# Patient Record
Sex: Female | Born: 1950 | Race: Black or African American | Hispanic: No | Smoking: Current every day smoker
Health system: Southern US, Community
[De-identification: ages and names within clinical notes are randomized; demographics above are authoritative.]

## PROBLEM LIST (undated history)

## (undated) DIAGNOSIS — F32A Depression, unspecified: Secondary | ICD-10-CM

## (undated) DIAGNOSIS — M1990 Unspecified osteoarthritis, unspecified site: Secondary | ICD-10-CM

## (undated) DIAGNOSIS — E785 Hyperlipidemia, unspecified: Secondary | ICD-10-CM

## (undated) DIAGNOSIS — I10 Essential (primary) hypertension: Secondary | ICD-10-CM

## (undated) DIAGNOSIS — F419 Anxiety disorder, unspecified: Secondary | ICD-10-CM

## (undated) DIAGNOSIS — N289 Disorder of kidney and ureter, unspecified: Secondary | ICD-10-CM

## (undated) DIAGNOSIS — C50919 Malignant neoplasm of unspecified site of unspecified female breast: Secondary | ICD-10-CM

## (undated) DIAGNOSIS — I219 Acute myocardial infarction, unspecified: Secondary | ICD-10-CM

## (undated) DIAGNOSIS — Z9889 Other specified postprocedural states: Secondary | ICD-10-CM

## (undated) DIAGNOSIS — I509 Heart failure, unspecified: Secondary | ICD-10-CM

## (undated) DIAGNOSIS — R112 Nausea with vomiting, unspecified: Secondary | ICD-10-CM

## (undated) DIAGNOSIS — E119 Type 2 diabetes mellitus without complications: Secondary | ICD-10-CM

## (undated) DIAGNOSIS — G4733 Obstructive sleep apnea (adult) (pediatric): Secondary | ICD-10-CM

## (undated) DIAGNOSIS — J449 Chronic obstructive pulmonary disease, unspecified: Secondary | ICD-10-CM

## (undated) DIAGNOSIS — T7840XA Allergy, unspecified, initial encounter: Secondary | ICD-10-CM

## (undated) HISTORY — DX: Type 2 diabetes mellitus without complications: E11.9

## (undated) HISTORY — DX: Allergy, unspecified, initial encounter: T78.40XA

## (undated) HISTORY — DX: Malignant neoplasm of unspecified site of unspecified female breast: C50.919

## (undated) HISTORY — PX: TUBAL LIGATION: SHX77

## (undated) HISTORY — DX: Obstructive sleep apnea (adult) (pediatric): G47.33

## (undated) HISTORY — PX: BREAST SURGERY: SHX581

## (undated) HISTORY — PX: BACK SURGERY: SHX140

## (undated) HISTORY — DX: Essential (primary) hypertension: I10

## (undated) HISTORY — DX: Hyperlipidemia, unspecified: E78.5

## (undated) HISTORY — PX: REPLACEMENT TOTAL KNEE: SUR1224

---

## 2001-07-19 ENCOUNTER — Inpatient Hospital Stay (HOSPITAL_COMMUNITY): Admission: RE | Admit: 2001-07-19 | Discharge: 2001-07-22 | Payer: Self-pay | Admitting: Orthopaedic Surgery

## 2001-07-19 ENCOUNTER — Encounter: Payer: Self-pay | Admitting: Orthopaedic Surgery

## 2016-05-24 DIAGNOSIS — I2510 Atherosclerotic heart disease of native coronary artery without angina pectoris: Secondary | ICD-10-CM

## 2016-05-24 DIAGNOSIS — Z9889 Other specified postprocedural states: Secondary | ICD-10-CM

## 2016-05-24 HISTORY — DX: Other specified postprocedural states: Z98.890

## 2016-05-24 HISTORY — DX: Atherosclerotic heart disease of native coronary artery without angina pectoris: I25.10

## 2017-05-24 DIAGNOSIS — Z96659 Presence of unspecified artificial knee joint: Secondary | ICD-10-CM

## 2017-05-24 HISTORY — DX: Presence of unspecified artificial knee joint: Z96.659

## 2019-10-26 DIAGNOSIS — E785 Hyperlipidemia, unspecified: Secondary | ICD-10-CM | POA: Diagnosis not present

## 2019-10-26 DIAGNOSIS — J449 Chronic obstructive pulmonary disease, unspecified: Secondary | ICD-10-CM | POA: Diagnosis not present

## 2019-10-26 DIAGNOSIS — I10 Essential (primary) hypertension: Secondary | ICD-10-CM | POA: Diagnosis not present

## 2019-10-26 DIAGNOSIS — Z6841 Body Mass Index (BMI) 40.0 and over, adult: Secondary | ICD-10-CM | POA: Diagnosis not present

## 2019-10-26 DIAGNOSIS — E559 Vitamin D deficiency, unspecified: Secondary | ICD-10-CM | POA: Diagnosis not present

## 2019-10-26 DIAGNOSIS — E1121 Type 2 diabetes mellitus with diabetic nephropathy: Secondary | ICD-10-CM | POA: Diagnosis not present

## 2019-10-26 DIAGNOSIS — G4730 Sleep apnea, unspecified: Secondary | ICD-10-CM | POA: Diagnosis not present

## 2019-10-26 DIAGNOSIS — Z79899 Other long term (current) drug therapy: Secondary | ICD-10-CM | POA: Diagnosis not present

## 2019-11-21 DIAGNOSIS — G894 Chronic pain syndrome: Secondary | ICD-10-CM | POA: Diagnosis not present

## 2019-11-21 DIAGNOSIS — M545 Low back pain: Secondary | ICD-10-CM | POA: Diagnosis not present

## 2019-11-21 DIAGNOSIS — M25569 Pain in unspecified knee: Secondary | ICD-10-CM | POA: Diagnosis not present

## 2019-11-21 DIAGNOSIS — M130 Polyarthritis, unspecified: Secondary | ICD-10-CM | POA: Diagnosis not present

## 2019-11-21 DIAGNOSIS — Z79891 Long term (current) use of opiate analgesic: Secondary | ICD-10-CM | POA: Diagnosis not present

## 2019-12-19 DIAGNOSIS — M25569 Pain in unspecified knee: Secondary | ICD-10-CM | POA: Diagnosis not present

## 2019-12-19 DIAGNOSIS — Z79891 Long term (current) use of opiate analgesic: Secondary | ICD-10-CM | POA: Diagnosis not present

## 2019-12-19 DIAGNOSIS — M130 Polyarthritis, unspecified: Secondary | ICD-10-CM | POA: Diagnosis not present

## 2019-12-19 DIAGNOSIS — M545 Low back pain: Secondary | ICD-10-CM | POA: Diagnosis not present

## 2020-01-16 DIAGNOSIS — Z79891 Long term (current) use of opiate analgesic: Secondary | ICD-10-CM | POA: Diagnosis not present

## 2020-01-16 DIAGNOSIS — E559 Vitamin D deficiency, unspecified: Secondary | ICD-10-CM | POA: Diagnosis not present

## 2020-01-16 DIAGNOSIS — M25569 Pain in unspecified knee: Secondary | ICD-10-CM | POA: Diagnosis not present

## 2020-01-16 DIAGNOSIS — M545 Low back pain: Secondary | ICD-10-CM | POA: Diagnosis not present

## 2020-01-16 DIAGNOSIS — Z79899 Other long term (current) drug therapy: Secondary | ICD-10-CM | POA: Diagnosis not present

## 2020-01-16 DIAGNOSIS — M130 Polyarthritis, unspecified: Secondary | ICD-10-CM | POA: Diagnosis not present

## 2020-01-29 DIAGNOSIS — E1121 Type 2 diabetes mellitus with diabetic nephropathy: Secondary | ICD-10-CM | POA: Diagnosis not present

## 2020-01-29 DIAGNOSIS — G4730 Sleep apnea, unspecified: Secondary | ICD-10-CM | POA: Diagnosis not present

## 2020-01-29 DIAGNOSIS — I10 Essential (primary) hypertension: Secondary | ICD-10-CM | POA: Diagnosis not present

## 2020-01-29 DIAGNOSIS — E785 Hyperlipidemia, unspecified: Secondary | ICD-10-CM | POA: Diagnosis not present

## 2020-01-29 DIAGNOSIS — J449 Chronic obstructive pulmonary disease, unspecified: Secondary | ICD-10-CM | POA: Diagnosis not present

## 2020-01-29 DIAGNOSIS — Z6841 Body Mass Index (BMI) 40.0 and over, adult: Secondary | ICD-10-CM | POA: Diagnosis not present

## 2020-02-01 DIAGNOSIS — E559 Vitamin D deficiency, unspecified: Secondary | ICD-10-CM | POA: Diagnosis not present

## 2020-02-01 DIAGNOSIS — Z79899 Other long term (current) drug therapy: Secondary | ICD-10-CM | POA: Diagnosis not present

## 2020-02-13 DIAGNOSIS — M545 Low back pain: Secondary | ICD-10-CM | POA: Diagnosis not present

## 2020-02-13 DIAGNOSIS — M130 Polyarthritis, unspecified: Secondary | ICD-10-CM | POA: Diagnosis not present

## 2020-02-13 DIAGNOSIS — Z79891 Long term (current) use of opiate analgesic: Secondary | ICD-10-CM | POA: Diagnosis not present

## 2020-02-13 DIAGNOSIS — M25569 Pain in unspecified knee: Secondary | ICD-10-CM | POA: Diagnosis not present

## 2020-03-03 DIAGNOSIS — M25559 Pain in unspecified hip: Secondary | ICD-10-CM | POA: Diagnosis not present

## 2020-03-04 ENCOUNTER — Emergency Department (HOSPITAL_COMMUNITY): Payer: Medicare Other

## 2020-03-04 ENCOUNTER — Other Ambulatory Visit: Payer: Self-pay

## 2020-03-04 ENCOUNTER — Emergency Department (HOSPITAL_COMMUNITY)
Admission: EM | Admit: 2020-03-04 | Discharge: 2020-03-05 | Disposition: A | Discharge status: Home / Self Care | Payer: Medicare Other | Attending: Emergency Medicine | Admitting: Emergency Medicine

## 2020-03-04 ENCOUNTER — Encounter (HOSPITAL_COMMUNITY): Payer: Self-pay | Admitting: Emergency Medicine

## 2020-03-04 DIAGNOSIS — I110 Hypertensive heart disease with heart failure: Secondary | ICD-10-CM | POA: Insufficient documentation

## 2020-03-04 DIAGNOSIS — M160 Bilateral primary osteoarthritis of hip: Secondary | ICD-10-CM

## 2020-03-04 DIAGNOSIS — F17200 Nicotine dependence, unspecified, uncomplicated: Secondary | ICD-10-CM | POA: Insufficient documentation

## 2020-03-04 DIAGNOSIS — M79604 Pain in right leg: Secondary | ICD-10-CM | POA: Diagnosis not present

## 2020-03-04 DIAGNOSIS — M1612 Unilateral primary osteoarthritis, left hip: Secondary | ICD-10-CM | POA: Diagnosis not present

## 2020-03-04 DIAGNOSIS — M5441 Lumbago with sciatica, right side: Secondary | ICD-10-CM | POA: Diagnosis not present

## 2020-03-04 DIAGNOSIS — E119 Type 2 diabetes mellitus without complications: Secondary | ICD-10-CM | POA: Insufficient documentation

## 2020-03-04 DIAGNOSIS — M545 Low back pain, unspecified: Secondary | ICD-10-CM | POA: Insufficient documentation

## 2020-03-04 DIAGNOSIS — I509 Heart failure, unspecified: Secondary | ICD-10-CM | POA: Diagnosis not present

## 2020-03-04 DIAGNOSIS — M5431 Sciatica, right side: Secondary | ICD-10-CM

## 2020-03-04 DIAGNOSIS — M1611 Unilateral primary osteoarthritis, right hip: Secondary | ICD-10-CM | POA: Diagnosis not present

## 2020-03-04 HISTORY — DX: Essential (primary) hypertension: I10

## 2020-03-04 HISTORY — DX: Disorder of kidney and ureter, unspecified: N28.9

## 2020-03-04 HISTORY — DX: Chronic obstructive pulmonary disease, unspecified: J44.9

## 2020-03-04 HISTORY — DX: Type 2 diabetes mellitus without complications: E11.9

## 2020-03-04 HISTORY — DX: Heart failure, unspecified: I50.9

## 2020-03-04 HISTORY — DX: Unspecified osteoarthritis, unspecified site: M19.90

## 2020-03-04 LAB — COMPREHENSIVE METABOLIC PANEL
ALT: 16 U/L (ref 0–44)
AST: 16 U/L (ref 15–41)
Albumin: 3.4 g/dL — ABNORMAL LOW (ref 3.5–5.0)
Alkaline Phosphatase: 50 U/L (ref 38–126)
Anion gap: 7 (ref 5–15)
BUN: 12 mg/dL (ref 8–23)
CO2: 26 mmol/L (ref 22–32)
Calcium: 9.7 mg/dL (ref 8.9–10.3)
Chloride: 105 mmol/L (ref 98–111)
Creatinine, Ser: 0.99 mg/dL (ref 0.44–1.00)
GFR, Estimated: 58 mL/min — ABNORMAL LOW (ref >60)
Glucose, Bld: 119 mg/dL — ABNORMAL HIGH (ref 70–99)
Potassium: 4.5 mmol/L (ref 3.5–5.1)
Sodium: 138 mmol/L (ref 135–145)
Total Bilirubin: 0.5 mg/dL (ref 0.3–1.2)
Total Protein: 6.3 g/dL — ABNORMAL LOW (ref 6.5–8.1)

## 2020-03-04 LAB — LIPASE, BLOOD: Lipase: 30 U/L (ref 11–51)

## 2020-03-04 LAB — CBC
HCT: 36.6 % (ref 36.0–46.0)
Hemoglobin: 12.3 g/dL (ref 12.0–15.0)
MCH: 31.5 pg (ref 26.0–34.0)
MCHC: 33.6 g/dL (ref 30.0–36.0)
MCV: 93.8 fL (ref 80.0–100.0)
Platelets: 207 10*3/uL (ref 150–400)
RBC: 3.9 MIL/uL (ref 3.87–5.11)
RDW: 14.8 % (ref 11.5–15.5)
WBC: 6.5 10*3/uL (ref 4.0–10.5)
nRBC: 0 % (ref 0.0–0.2)

## 2020-03-04 MED ORDER — PREDNISONE 50 MG PO TABS
60.0000 mg | ORAL_TABLET | Freq: Once | ORAL | Status: AC
  Administered 2020-03-04: 60 mg via ORAL
  Filled 2020-03-04: qty 1

## 2020-03-04 MED ORDER — PREDNISONE 20 MG PO TABS: 20.0000 mg | ORAL_TABLET | Freq: Two times a day (BID) | ORAL | 0 refills | Status: DC

## 2020-03-04 NOTE — ED Notes (Signed)
Entered room and introduced self to patient and family at the bedside. Pt appears uncomfortable and writhing in bed with reports of lower back and abdomen pain. All questions and concerns voiced addressed at this time. Bed is locked in the lowest position, side rails x2, call bell within reach. Educated on hourly rounding and is in agreement.

## 2020-03-04 NOTE — Discharge Instructions (Addendum)
The pain you are having in your right buttock and right leg is from sciatica.  This is an inflammatory condition of the sciatic nerve.  To treat this we are prescribing prednisone, and advised to use heat on the sore area 3-4 times a day.  This should help your pain.  We cannot add additional pain medicine to your chronic treatment for chronic pain.  Continue to take your hydrocodone, every day as scheduled.

## 2020-03-04 NOTE — ED Triage Notes (Signed)
Pt c/o lower abd pain that started about 3-4 weeks ago. Denies n/v/d

## 2020-03-04 NOTE — ED Notes (Signed)
ED Provider at bedside. 

## 2020-03-04 NOTE — ED Provider Notes (Signed)
[facility] EMERGENCY DEPARTMENT Provider Note   CSN: [id number] Arrival date & time: 03/04/20  1816     History Chief Complaint  Patient presents with  . Abdominal Pain    Katelyn Kaufman is a 69 y.o. female.  HPI Patient presents for evaluation of lower back and right leg pain.  She reports onset of this pain several weeks ago, it is worsening.  She saw her PCP yesterday for the same and he advised that she get an x-ray of her hip.  She has chronic pain and takes hydrocodone 10 mg twice a day every day.  No recent trauma.  She describes the pain as in her right anterior thigh her right buttock and her right knee.  She denies back pain at this time.  She denies fever, chills, nausea, vomiting, weakness or dizziness.  The pain is worse with walking.  There are no other known modifying factors.    Past Medical History:  Diagnosis Date  . Arthritis   . CHF (congestive heart failure) ([place])   . COPD (chronic obstructive pulmonary disease) ([place])   . Diabetes mellitus without complication ([place])   . Hypertension   . Renal disorder     There are no problems to display for this patient.   Past Surgical History:  Procedure Laterality Date  . BACK SURGERY    . BREAST SURGERY    . REPLACEMENT TOTAL KNEE       OB History   No obstetric history on file.     History reviewed. No pertinent family history.  Social History   Tobacco Use  . Smoking status: Current Every Day Smoker    Packs/day: 0.50  Substance Use Topics  . Alcohol use: Not on file  . Drug use: Not on file    Home Medications Prior to Admission medications   Not on File    Allergies    Patient has no allergy information on record.  Review of Systems   Review of Systems  All other systems reviewed and are negative.   Physical Exam Updated Vital Signs BP (!) 151/90 (BP Location: Left Arm)   Pulse 77   Temp 98.5 F (36.9 C) (Oral)   Resp 16   Ht 5\' 4"  (1.626 m)   Wt 104.3 kg   SpO2 100%    BMI 39.48 kg/m   Physical Exam Vitals and nursing note reviewed.  Constitutional:      General: She is not in acute distress.    Appearance: She is well-developed. She is obese. She is not ill-appearing, toxic-appearing or diaphoretic.  HENT:     Head: Normocephalic and atraumatic.     Right Ear: External ear normal.     Left Ear: External ear normal.  Eyes:     Conjunctiva/sclera: Conjunctivae normal.     Pupils: Pupils are equal, round, and reactive to light.  Neck:     Trachea: Phonation normal.  Cardiovascular:     Rate and Rhythm: Normal rate.  Pulmonary:     Effort: Pulmonary effort is normal.  Musculoskeletal:     Cervical back: Normal range of motion and neck supple.     Comments: She guards against movement of the right upper leg secondary to pain.  She is tender to palpation in the right knee, and the right posterior femoral area.  Skin:    General: Skin is warm and dry.  Neurological:     Mental Status: She is alert and oriented to person, place,  and time.     Cranial Nerves: No cranial nerve deficit.     Sensory: No sensory deficit.     Motor: No abnormal muscle tone.     Coordination: Coordination normal.  Psychiatric:        Mood and Affect: Mood normal.        Behavior: Behavior normal.        Thought Content: Thought content normal.        Judgment: Judgment normal.     ED Results / Procedures / Treatments   Labs (all labs ordered are listed, but only abnormal results are displayed) Labs Reviewed  COMPREHENSIVE METABOLIC PANEL - Abnormal; Notable for the following components:      Result Value   Glucose, Bld 119 (*)    Total Protein 6.3 (*)    Albumin 3.4 (*)    GFR, Estimated 58 (*)    All other components within normal limits  LIPASE, BLOOD  CBC  URINALYSIS, ROUTINE W REFLEX MICROSCOPIC    EKG None  Radiology No results found.  Procedures Procedures (including critical care time)  Medications Ordered in ED Medications - No data to  display  ED Course  I have reviewed the triage vital signs and the nursing notes.  Pertinent labs & imaging results that were available during my care of the patient were reviewed by me and considered in my medical decision making (see chart for details).    MDM Rules/Calculators/A&P                           Patient Vitals for the past 24 hrs:  BP Temp Temp src Pulse Resp SpO2 Height Weight  03/04/20 1830 (!) 151/90 98.5 F (36.9 C) Oral 77 16 100 % 5\' 4"  (1.626 m) 104.3 kg    11:27 PM Reevaluation with update and discussion. After initial assessment and treatment, an updated evaluation reveals no change in clinical status, findings discussed with the patient and all questions were answered. Daleen Bo   Medical Decision Making:  This patient is presenting for evaluation of sciatica-like pain, which does require a range of treatment options, and is a complaint that involves a moderate risk of morbidity and mortality. The differential diagnoses include lumbar radiculopathy, sciatica, fracture. I decided to review old records, and in summary obese elderly female chronic pain, presenting now with pain in the right buttock and right anterior thigh.  I obtained additional historical information from a friend at the bedside.  Clinical Laboratory Tests Ordered, included CBC, Metabolic panel and Lipase. Review indicates minor abnormalities including hyperglycemia, low protein, slightly low GFR. Radiologic Tests Ordered, included right hip and pelvis.  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  No indication for further ED  intervention.  Patient has chronic pain and takes narcotics daily, prescribed by her PCP.  There is no indication for initiation of alternative pain management from the emergency department.  She will be given a prescription for prednisone burst, to help her sciatic symptoms and instructed to use heat to the affected area.  CRITICAL CARE-no Performed by: Daleen Bo  Nursing Notes Reviewed/ Care Coordinated Applicable Imaging Reviewed Interpretation of Laboratory Data incorporated into ED treatment  The patient appears reasonably screened and/or stabilized for discharge and I doubt any other medical condition or other [facility] requiring further screening, evaluation, or treatment in the ED at this time prior to discharge.  Plan: Home Medications-continue usual; Home Treatments-heat to affected area; return here if the recommended treatment, does not improve the symptoms; Recommended follow up-PCP, as needed     Final Clinical Impression(s) / ED Diagnoses Final diagnoses:  None    Rx / DC Orders ED Discharge Orders    None       Daleen Bo, MD 03/05/20 1046

## 2020-03-04 NOTE — ED Notes (Signed)
Patient transported to X-ray 

## 2020-03-28 DIAGNOSIS — M545 Low back pain, unspecified: Secondary | ICD-10-CM | POA: Diagnosis not present

## 2020-03-28 DIAGNOSIS — M5416 Radiculopathy, lumbar region: Secondary | ICD-10-CM | POA: Diagnosis not present

## 2020-03-28 DIAGNOSIS — M25569 Pain in unspecified knee: Secondary | ICD-10-CM | POA: Diagnosis not present

## 2020-03-28 DIAGNOSIS — M130 Polyarthritis, unspecified: Secondary | ICD-10-CM | POA: Diagnosis not present

## 2020-03-28 DIAGNOSIS — Z79891 Long term (current) use of opiate analgesic: Secondary | ICD-10-CM | POA: Diagnosis not present

## 2020-04-10 DIAGNOSIS — H2513 Age-related nuclear cataract, bilateral: Secondary | ICD-10-CM | POA: Diagnosis not present

## 2020-04-10 DIAGNOSIS — E119 Type 2 diabetes mellitus without complications: Secondary | ICD-10-CM | POA: Diagnosis not present

## 2020-04-10 DIAGNOSIS — Z794 Long term (current) use of insulin: Secondary | ICD-10-CM | POA: Diagnosis not present

## 2020-04-10 DIAGNOSIS — H5203 Hypermetropia, bilateral: Secondary | ICD-10-CM | POA: Diagnosis not present

## 2020-04-10 DIAGNOSIS — Z7984 Long term (current) use of oral hypoglycemic drugs: Secondary | ICD-10-CM | POA: Diagnosis not present

## 2020-04-28 DIAGNOSIS — Z79891 Long term (current) use of opiate analgesic: Secondary | ICD-10-CM | POA: Diagnosis not present

## 2020-04-28 DIAGNOSIS — M5416 Radiculopathy, lumbar region: Secondary | ICD-10-CM | POA: Diagnosis not present

## 2020-04-28 DIAGNOSIS — M130 Polyarthritis, unspecified: Secondary | ICD-10-CM | POA: Diagnosis not present

## 2020-04-28 DIAGNOSIS — M25569 Pain in unspecified knee: Secondary | ICD-10-CM | POA: Diagnosis not present

## 2020-04-28 DIAGNOSIS — M545 Low back pain, unspecified: Secondary | ICD-10-CM | POA: Diagnosis not present

## 2020-04-29 DIAGNOSIS — E785 Hyperlipidemia, unspecified: Secondary | ICD-10-CM | POA: Diagnosis not present

## 2020-04-29 DIAGNOSIS — E1121 Type 2 diabetes mellitus with diabetic nephropathy: Secondary | ICD-10-CM | POA: Diagnosis not present

## 2020-04-29 DIAGNOSIS — J449 Chronic obstructive pulmonary disease, unspecified: Secondary | ICD-10-CM | POA: Diagnosis not present

## 2020-04-29 DIAGNOSIS — I10 Essential (primary) hypertension: Secondary | ICD-10-CM | POA: Diagnosis not present

## 2020-04-29 DIAGNOSIS — G4730 Sleep apnea, unspecified: Secondary | ICD-10-CM | POA: Diagnosis not present

## 2020-06-23 ENCOUNTER — Ambulatory Visit: Payer: Medicare Other | Admitting: Cardiology

## 2020-06-23 DIAGNOSIS — Z79891 Long term (current) use of opiate analgesic: Secondary | ICD-10-CM | POA: Diagnosis not present

## 2020-06-23 DIAGNOSIS — M545 Low back pain, unspecified: Secondary | ICD-10-CM | POA: Diagnosis not present

## 2020-06-23 DIAGNOSIS — M25569 Pain in unspecified knee: Secondary | ICD-10-CM | POA: Diagnosis not present

## 2020-06-23 DIAGNOSIS — M5416 Radiculopathy, lumbar region: Secondary | ICD-10-CM | POA: Diagnosis not present

## 2020-06-23 DIAGNOSIS — M130 Polyarthritis, unspecified: Secondary | ICD-10-CM | POA: Diagnosis not present

## 2020-06-27 DIAGNOSIS — M5451 Vertebrogenic low back pain: Secondary | ICD-10-CM | POA: Diagnosis not present

## 2020-06-27 DIAGNOSIS — M5416 Radiculopathy, lumbar region: Secondary | ICD-10-CM | POA: Diagnosis not present

## 2020-07-07 ENCOUNTER — Encounter: Payer: Self-pay | Admitting: *Deleted

## 2020-07-08 ENCOUNTER — Ambulatory Visit: Payer: Medicare Other | Admitting: Cardiology

## 2020-07-08 ENCOUNTER — Encounter: Payer: Self-pay | Admitting: *Deleted

## 2020-07-08 ENCOUNTER — Encounter: Payer: Self-pay | Admitting: Cardiology

## 2020-07-08 VITALS — BP 148/82 | HR 77 | Ht 64.0 in | Wt 244.0 lb

## 2020-07-08 DIAGNOSIS — I10 Essential (primary) hypertension: Secondary | ICD-10-CM

## 2020-07-08 DIAGNOSIS — E782 Mixed hyperlipidemia: Secondary | ICD-10-CM

## 2020-07-08 DIAGNOSIS — I25119 Atherosclerotic heart disease of native coronary artery with unspecified angina pectoris: Secondary | ICD-10-CM | POA: Diagnosis not present

## 2020-07-08 MED ORDER — CLOPIDOGREL BISULFATE 75 MG PO TABS: 75.0000 mg | ORAL_TABLET | Freq: Every day | ORAL | 3 refills | Status: DC

## 2020-07-08 NOTE — Patient Instructions (Addendum)
Medication Instructions:   Your physician has recommended you make the following change in your medication:   Start clopidogrel (plavix) 75 mg by mouth daily  Continue other medications the same  Labwork:  none  Testing/Procedures: Your physician has requested that you have an echocardiogram. Echocardiography is a painless test that uses sound waves to create images of your heart. It provides your doctor with information about the size and shape of your heart and how well your heart's chambers and valves are working. This procedure takes approximately one hour. There are no restrictions for this procedure. Your physician has requested that you have a lexiscan myoview. For further information please visit [email or web address]. Please follow instruction sheet, as given.  Follow-Up:  Your physician recommends that you schedule a follow-up appointment in: pending.   Any Other Special Instructions Will Be Listed Below (If Applicable).  Please call our office with the information about your heart doctor in [place].  If you need a refill on your cardiac medications before your next appointment, please call your pharmacy.

## 2020-07-08 NOTE — Progress Notes (Signed)
Cardiology Office Note  Date: 07/08/2020   ID: Katelyn Kaufman, DOB 18-Sep-1950, MRN [id number]  PCP:  Neale Burly, MD  Cardiologist:  Rozann Lesches, MD Electrophysiologist:  None   Chief Complaint  Patient presents with  . History of CAD    History of Present Illness: Katelyn Kaufman is a 70 y.o. female referred for cardiology consultation by Dr. Sherrie Sport for the evaluation of chest pain and CAD.  At this point I do not have any records regarding her previous cardiac evaluation while living in [place].  She states that she had 3 separate heart attacks in 2017, 2018, and 2019, receiving a stent intervention with each of these events.  She reports a history of recurrent chest discomfort since that time, managed medically by her previous cardiologist.  She is limited with chronic leg pain and back pain, follows in a pain clinic on oxycodone.  She uses a cane to ambulate.  She states that she has been experiencing a discomfort in her upper chest and shoulders, also has chronic pain that goes down from her chest.  No definite exertional precipitant.  I reviewed her cardiac regimen which is outlined below.  It sounds like she was taking Brilinta at one point, not consistent recently.  She has been on a baby aspirin.  I personally reviewed her ECG today which shows normal sinus rhythm.   Past Medical History:  Diagnosis Date  . Arthritis   . CAD (coronary artery disease) 2018  . COPD (chronic obstructive pulmonary disease) ([place])   . Essential hypertension   . Hyperlipidemia   . OSA (obstructive sleep apnea)   . Type 2 diabetes mellitus ([place])     Past Surgical History:  Procedure Laterality Date  . BACK SURGERY    . BREAST SURGERY    . REPLACEMENT TOTAL KNEE      Current Outpatient Medications  Medication Sig Dispense Refill  . aspirin EC 81 MG tablet Take 81 mg by mouth daily. Swallow whole.    . carvedilol (COREG) 12.5 MG tablet Take 12.5 mg by mouth 2 (two) times daily  with a meal.    . clopidogrel (PLAVIX) 75 MG tablet Take 1 tablet (75 mg total) by mouth daily. 90 tablet 3  . DULoxetine (CYMBALTA) 60 MG capsule Take 60 mg by mouth 2 (two) times daily.    Marland Kitchen ezetimibe (ZETIA) 10 MG tablet Take 10 mg by mouth daily.    . furosemide (LASIX) 40 MG tablet Take 40 mg by mouth daily.    Marland Kitchen glimepiride (AMARYL) 4 MG tablet Take 4 mg by mouth in the morning and at bedtime.    . insulin glargine (LANTUS) 100 UNIT/ML injection Inject 35 Units into the skin daily.    . isosorbide mononitrate (IMDUR) 30 MG 24 hr tablet Take 30 mg by mouth daily.    . Levocetirizine Dihydrochloride (XYZAL PO) Take by mouth.    . losartan (COZAAR) 50 MG tablet Take 50 mg by mouth daily.    . metFORMIN (GLUCOPHAGE) 500 MG tablet Take by mouth 2 (two) times daily with a meal.    . predniSONE (DELTASONE) 20 MG tablet Take 1 tablet (20 mg total) by mouth 2 (two) times daily. 10 tablet 0  . rosuvastatin (CRESTOR) 10 MG tablet Take 10 mg by mouth daily.    . varenicline (CHANTIX) 1 MG tablet Take 1 mg by mouth 2 (two) times daily.     No current facility-administered medications for this visit.  Allergies:  Latex   Social History: The patient  reports that she has been smoking cigarettes. She has been smoking about 0.50 packs per day. She has never used smokeless tobacco. She reports that she does not drink alcohol and does not use drugs.   Family History: The patient's family history includes Alcohol abuse in her father; COPD in her mother; Heart attack in her brother; Hypertension in her sister.   ROS: No palpitations or syncope.  Physical Exam: VS:  BP (!) 148/82   Pulse 77   Ht 5\' 4"  (1.626 m)   Wt 244 lb (110.7 kg)   SpO2 94%   BMI 41.88 kg/m , BMI Body mass index is 41.88 kg/m.  Wt Readings from Last 3 Encounters:  07/08/20 244 lb (110.7 kg)  03/04/20 230 lb (104.3 kg)    General: Patient appears comfortable at rest. HEENT: Conjunctiva and lids normal, wearing a  mask. Neck: Supple, no elevated JVP or carotid bruits, no thyromegaly. Lungs: Clear to auscultation, nonlabored breathing at rest. Cardiac: Regular rate and rhythm, no S3 or significant systolic murmur, no pericardial rub. Abdomen: Soft, nontender, bowel sounds present. Extremities: No pitting edema, distal pulses 2+. Skin: Warm and dry. Musculoskeletal: No kyphosis. Neuropsychiatric: Alert and oriented x3, affect grossly appropriate.  ECG:  No old tracing for review today.  Recent Labwork: 03/04/2020: ALT 16; AST 16; BUN 12; Creatinine, Ser 0.99; Hemoglobin 12.3; Platelets 207; Potassium 4.5; Sodium 138   Other Studies Reviewed Today:  No previous cardiac testing available for review today.  Assessment and Plan:  1.  Reported history of CAD as discussed above, details not clear regarding stent revascularization.  I have asked her to review medical records she has at home and contact us back with information so that we can get her prior records.  In the meanwhile continue aspirin, starting Plavix 75 mg daily as well.  Continue Coreg, Zetia, Imdur, losartan, and Crestor.  We will obtain an echocardiogram as well as a Lexiscan Myoview to assess cardiac structure and ischemic status.  Further recommendations to follow.  2.  Mixed hyperlipidemia, currently on Crestor and Zetia.  She is now following with Dr. Sherrie Sport anticipates wellness check with lab work in the next few months.  Ideally LDL should be under 70.  3.  Essential hypertension, systolic is in the one forties today.  May need further medication up titration.  4.  Type 2 diabetes mellitus, currently on Glucophage, Amaryl, and Lantus.  She is following with Dr. Sherrie Sport.  Medication Adjustments/Labs and Tests Ordered: Current medicines are reviewed at length with the patient today.  Concerns regarding medicines are outlined above.   Tests Ordered: Orders Placed This Encounter  Procedures  . NM Myocar Multi W/Spect W/Wall  Motion / EF  . EKG 12-Lead  . ECHOCARDIOGRAM COMPLETE    Medication Changes: Meds ordered this encounter  Medications  . clopidogrel (PLAVIX) 75 MG tablet    Sig: Take 1 tablet (75 mg total) by mouth daily.    Dispense:  90 tablet    Refill:  3    07/08/2020 NEW    Disposition:  Follow up test results.  Signed, Satira Sark, MD, [facility] 07/08/2020 1:41 PM    [facility] [place], [place],  [address] Phone: [phone]; Fax: [phone]

## 2020-07-09 ENCOUNTER — Telehealth: Payer: Self-pay | Admitting: Cardiology

## 2020-07-09 ENCOUNTER — Encounter: Payer: Self-pay | Admitting: *Deleted

## 2020-07-09 NOTE — Telephone Encounter (Signed)
New message   Patient said office requested she call back with this information  Dr Carney Living  [facility] [address]     [phone]  Fax [phone]

## 2020-07-09 NOTE — Telephone Encounter (Signed)
Request for records sent to medical records department (fax number [phone])

## 2020-07-23 ENCOUNTER — Telehealth: Payer: Self-pay | Admitting: Cardiology

## 2020-07-23 NOTE — Telephone Encounter (Signed)
Pre-cert Verification for the following procedure    LEXISCAN ECHO   DATE:08/14/2020  LOCATION: [facility]

## 2020-07-23 NOTE — Telephone Encounter (Signed)
Called patient to inform her of upcoming appointments.  Unable to reach. Will mail information to patient.

## 2020-07-29 DIAGNOSIS — G4730 Sleep apnea, unspecified: Secondary | ICD-10-CM | POA: Diagnosis not present

## 2020-07-29 DIAGNOSIS — Z0000 Encounter for general adult medical examination without abnormal findings: Secondary | ICD-10-CM | POA: Diagnosis not present

## 2020-07-29 DIAGNOSIS — E1121 Type 2 diabetes mellitus with diabetic nephropathy: Secondary | ICD-10-CM | POA: Diagnosis not present

## 2020-07-29 DIAGNOSIS — I10 Essential (primary) hypertension: Secondary | ICD-10-CM | POA: Diagnosis not present

## 2020-07-29 DIAGNOSIS — J449 Chronic obstructive pulmonary disease, unspecified: Secondary | ICD-10-CM | POA: Diagnosis not present

## 2020-07-29 DIAGNOSIS — E785 Hyperlipidemia, unspecified: Secondary | ICD-10-CM | POA: Diagnosis not present

## 2020-08-14 ENCOUNTER — Ambulatory Visit (HOSPITAL_COMMUNITY)
Admission: RE | Admit: 2020-08-14 | Discharge: 2020-08-14 | Disposition: A | Discharge status: Home / Self Care | Payer: Medicare Other | Source: Ambulatory Visit | Attending: Cardiology | Admitting: Cardiology

## 2020-08-14 ENCOUNTER — Encounter (HOSPITAL_BASED_OUTPATIENT_CLINIC_OR_DEPARTMENT_OTHER)
Admission: RE | Admit: 2020-08-14 | Discharge: 2020-08-14 | Disposition: A | Discharge status: Home / Self Care | Payer: Medicare Other | Source: Ambulatory Visit | Attending: Cardiology | Admitting: Cardiology

## 2020-08-14 ENCOUNTER — Encounter (HOSPITAL_COMMUNITY)
Admission: RE | Admit: 2020-08-14 | Discharge: 2020-08-14 | Disposition: A | Discharge status: Home / Self Care | Payer: Medicare Other | Source: Ambulatory Visit | Attending: Cardiology | Admitting: Cardiology

## 2020-08-14 ENCOUNTER — Other Ambulatory Visit: Payer: Self-pay

## 2020-08-14 ENCOUNTER — Encounter (HOSPITAL_COMMUNITY): Payer: Self-pay

## 2020-08-14 DIAGNOSIS — I25119 Atherosclerotic heart disease of native coronary artery with unspecified angina pectoris: Secondary | ICD-10-CM

## 2020-08-14 LAB — NM MYOCAR MULTI W/SPECT W/WALL MOTION / EF
LV dias vol: 83 mL (ref 46–106)
LV sys vol: 41 mL
Peak HR: 83 {beats}/min
RATE: 0.29
Rest HR: 63 {beats}/min
SDS: 4
SRS: 8
SSS: 12
TID: 0.93

## 2020-08-14 LAB — ECHOCARDIOGRAM COMPLETE
Area-P 1/2: 3.06 cm2
S' Lateral: 2.95 cm

## 2020-08-14 MED ORDER — REGADENOSON 0.4 MG/5ML IV SOLN
INTRAVENOUS | Status: AC
  Administered 2020-08-14: 0.4 mg via INTRAVENOUS
  Filled 2020-08-14: qty 5

## 2020-08-14 MED ORDER — TECHNETIUM TC 99M TETROFOSMIN IV KIT
10.0000 | PACK | Freq: Once | INTRAVENOUS | Status: AC | PRN
  Administered 2020-08-14: 10.5 via INTRAVENOUS

## 2020-08-14 MED ORDER — TECHNETIUM TC 99M TETROFOSMIN IV KIT
30.0000 | PACK | Freq: Once | INTRAVENOUS | Status: AC | PRN
  Administered 2020-08-14: 31 via INTRAVENOUS

## 2020-08-14 MED ORDER — SODIUM CHLORIDE FLUSH 0.9 % IV SOLN
INTRAVENOUS | Status: AC
  Administered 2020-08-14: 10 mL via INTRAVENOUS
  Filled 2020-08-14: qty 10

## 2020-08-14 NOTE — Progress Notes (Signed)
*  PRELIMINARY RESULTS* Echocardiogram 2D Echocardiogram has been performed.  Leavy Cella 08/14/2020, 12:04 PM

## 2020-08-15 ENCOUNTER — Telehealth: Payer: Self-pay | Admitting: *Deleted

## 2020-08-15 NOTE — Telephone Encounter (Signed)
-----   Message from Satira Sark, MD sent at 08/14/2020  4:34 PM EDT ----- Results reviewed.  LVEF normal without wall motion abnormalities.  See plan per discussion of Myoview report.

## 2020-08-15 NOTE — Telephone Encounter (Signed)
-----   Message from Satira Sark, MD sent at 08/14/2020  4:32 PM EDT ----- Results reviewed.  Please let her know that the stress test was reassuring overall, no clear evidence of major scar from prior heart attack or ischemia to suggest obstructive CAD at this time.  Would continue with plan for medical therapy.  Schedule follow-up in 3 months for symptom review.

## 2020-08-15 NOTE — Telephone Encounter (Signed)
Patient informed and verbalized understanding of plan. Copy sent to PCP 

## 2020-08-18 DIAGNOSIS — M545 Low back pain, unspecified: Secondary | ICD-10-CM | POA: Diagnosis not present

## 2020-08-18 DIAGNOSIS — Z79891 Long term (current) use of opiate analgesic: Secondary | ICD-10-CM | POA: Diagnosis not present

## 2020-08-18 DIAGNOSIS — M130 Polyarthritis, unspecified: Secondary | ICD-10-CM | POA: Diagnosis not present

## 2020-08-18 DIAGNOSIS — M25569 Pain in unspecified knee: Secondary | ICD-10-CM | POA: Diagnosis not present

## 2020-08-18 DIAGNOSIS — M5416 Radiculopathy, lumbar region: Secondary | ICD-10-CM | POA: Diagnosis not present

## 2020-08-20 DIAGNOSIS — E1121 Type 2 diabetes mellitus with diabetic nephropathy: Secondary | ICD-10-CM | POA: Diagnosis not present

## 2020-08-20 DIAGNOSIS — I10 Essential (primary) hypertension: Secondary | ICD-10-CM | POA: Diagnosis not present

## 2020-08-20 DIAGNOSIS — J449 Chronic obstructive pulmonary disease, unspecified: Secondary | ICD-10-CM | POA: Diagnosis not present

## 2020-08-20 DIAGNOSIS — E785 Hyperlipidemia, unspecified: Secondary | ICD-10-CM | POA: Diagnosis not present

## 2020-09-08 DIAGNOSIS — H25813 Combined forms of age-related cataract, bilateral: Secondary | ICD-10-CM | POA: Diagnosis not present

## 2020-09-20 DIAGNOSIS — J449 Chronic obstructive pulmonary disease, unspecified: Secondary | ICD-10-CM | POA: Diagnosis not present

## 2020-09-20 DIAGNOSIS — E785 Hyperlipidemia, unspecified: Secondary | ICD-10-CM | POA: Diagnosis not present

## 2020-09-20 DIAGNOSIS — G4730 Sleep apnea, unspecified: Secondary | ICD-10-CM | POA: Diagnosis not present

## 2020-09-20 DIAGNOSIS — I10 Essential (primary) hypertension: Secondary | ICD-10-CM | POA: Diagnosis not present

## 2020-09-20 DIAGNOSIS — E1121 Type 2 diabetes mellitus with diabetic nephropathy: Secondary | ICD-10-CM | POA: Diagnosis not present

## 2020-10-15 ENCOUNTER — Encounter (HOSPITAL_COMMUNITY): Payer: Self-pay

## 2020-10-15 ENCOUNTER — Other Ambulatory Visit: Payer: Self-pay

## 2020-10-15 DIAGNOSIS — M5416 Radiculopathy, lumbar region: Secondary | ICD-10-CM | POA: Diagnosis not present

## 2020-10-15 DIAGNOSIS — Z79891 Long term (current) use of opiate analgesic: Secondary | ICD-10-CM | POA: Diagnosis not present

## 2020-10-15 DIAGNOSIS — M545 Low back pain, unspecified: Secondary | ICD-10-CM | POA: Diagnosis not present

## 2020-10-15 DIAGNOSIS — M130 Polyarthritis, unspecified: Secondary | ICD-10-CM | POA: Diagnosis not present

## 2020-10-15 DIAGNOSIS — M25569 Pain in unspecified knee: Secondary | ICD-10-CM | POA: Diagnosis not present

## 2020-10-16 ENCOUNTER — Encounter (HOSPITAL_COMMUNITY)
Admission: RE | Admit: 2020-10-16 | Discharge: 2020-10-16 | Disposition: A | Discharge status: Home / Self Care | Payer: Medicare Other | Source: Ambulatory Visit | Attending: Ophthalmology | Admitting: Ophthalmology

## 2020-10-16 HISTORY — DX: Acute myocardial infarction, unspecified: I21.9

## 2020-10-17 NOTE — H&P (Signed)
Surgical History & Physical  Patient Name: Katelyn Kaufman DOB: 29-May-1950  Surgery: Cataract extraction with intraocular lens implant phacoemulsification; Left Eye  Surgeon: Baruch Goldmann MD Surgery Date:  10/24/2020 Pre-Op Date:  10/13/2020  HPI: A 21 Yr. old female patient *Pt did not bring meds list today* Pt presents for NP Cat eval, referred by Dr.Martin. Pt presents with bifocal gls that are yr old. Pt notes hazy VA, OS>OD, onset noted as gradual. Pt notes difficulty seeing street sins and tv captions clearly. Pt states that she no longer feels comfortable driving at night, due to glare. This is negatively affecting the patient's quality of life. Pt notes a "heavy feeling" OU in the am, not using any gtts. Pt notes occasional floaters OS. Pt denies any past h/o ocular injury or sx. Pt is a Type 2 IDDM, 127 (fasting, yesterday am), A1C-"was high". HPI was performed by Baruch Goldmann .  Medical History: Cataracts Macula Degeneration Diabetes Heart Problem High Blood Pressure LDL  Review of Systems Negative Allergic/Immunologic Negative Cardiovascular Negative Constitutional Negative Ear, Nose, Mouth & Throat Negative Endocrine Negative Eyes Negative Gastrointestinal Negative Genitourinary Negative Hemotologic/Lymphatic Negative Integumentary Negative Musculoskeletal Negative Neurological Negative Psychiatry Negative Respiratory  Social   Current every day smoker   Medication Metformin, Lantus, Metoprolol, Statin,   Sx/Procedures Open Heart Sx, Lumpectomy, Knee Replacement (right), Back Surgery,   Drug Allergies   NKDA  History & Physical: Heent:  Cataract, Left eye NECK: supple without bruits LUNGS: lungs clear to auscultation CV: regular rate and rhythm Abdomen: soft and non-tender  Impression & Plan: Assessment: 1.  COMBINED FORMS AGE RELATED CATARACT; Both Eyes (H25.813)  Plan: 1.  Cataract accounts for the patient's decreased vision. This visual  impairment is not correctable with a tolerable change in glasses or contact lenses. Cataract surgery with an implantation of a new lens should significantly improve the visual and functional status of the patient. Discussed all risks, benefits, alternatives, and potential complications. Discussed the procedures and recovery. Patient desires to have surgery. A-scan ordered and performed today for intra-ocular lens calculations. The surgery will be performed in order to improve vision for driving, reading, and for eye examinations. Recommend phacoemulsification with intra-ocular lens. Recommend Dextenza for post-operative pain and inflammation. Left Eye worse - first. Dilates poorly - shugacaine by protocol. Omidira.

## 2020-10-21 DIAGNOSIS — H25811 Combined forms of age-related cataract, right eye: Secondary | ICD-10-CM | POA: Diagnosis not present

## 2020-10-22 ENCOUNTER — Other Ambulatory Visit (HOSPITAL_COMMUNITY): Payer: Medicare Other

## 2020-10-24 ENCOUNTER — Encounter (HOSPITAL_COMMUNITY)
Admission: RE | Disposition: A | Discharge status: Home / Self Care | Payer: Self-pay | Source: Home / Self Care | Attending: Ophthalmology

## 2020-10-24 ENCOUNTER — Ambulatory Visit (HOSPITAL_COMMUNITY)
Admission: RE | Admit: 2020-10-24 | Discharge: 2020-10-24 | Disposition: A | Discharge status: Home / Self Care | Payer: Medicare Other | Attending: Ophthalmology | Admitting: Ophthalmology

## 2020-10-24 ENCOUNTER — Ambulatory Visit (HOSPITAL_COMMUNITY): Payer: Medicare Other | Admitting: Anesthesiology

## 2020-10-24 ENCOUNTER — Encounter (HOSPITAL_COMMUNITY): Payer: Self-pay | Admitting: Ophthalmology

## 2020-10-24 DIAGNOSIS — Z79899 Other long term (current) drug therapy: Secondary | ICD-10-CM | POA: Diagnosis not present

## 2020-10-24 DIAGNOSIS — Z7984 Long term (current) use of oral hypoglycemic drugs: Secondary | ICD-10-CM | POA: Insufficient documentation

## 2020-10-24 DIAGNOSIS — Z794 Long term (current) use of insulin: Secondary | ICD-10-CM | POA: Insufficient documentation

## 2020-10-24 DIAGNOSIS — H25813 Combined forms of age-related cataract, bilateral: Secondary | ICD-10-CM | POA: Diagnosis not present

## 2020-10-24 DIAGNOSIS — F17200 Nicotine dependence, unspecified, uncomplicated: Secondary | ICD-10-CM | POA: Diagnosis not present

## 2020-10-24 DIAGNOSIS — H25811 Combined forms of age-related cataract, right eye: Secondary | ICD-10-CM | POA: Diagnosis not present

## 2020-10-24 DIAGNOSIS — I2510 Atherosclerotic heart disease of native coronary artery without angina pectoris: Secondary | ICD-10-CM | POA: Diagnosis not present

## 2020-10-24 DIAGNOSIS — Z96651 Presence of right artificial knee joint: Secondary | ICD-10-CM | POA: Diagnosis not present

## 2020-10-24 DIAGNOSIS — E1136 Type 2 diabetes mellitus with diabetic cataract: Secondary | ICD-10-CM | POA: Insufficient documentation

## 2020-10-24 HISTORY — PX: CATARACT EXTRACTION W/PHACO: SHX586

## 2020-10-24 LAB — GLUCOSE, CAPILLARY: Glucose-Capillary: 149 mg/dL — ABNORMAL HIGH (ref 70–99)

## 2020-10-24 SURGERY — PHACOEMULSIFICATION, CATARACT, WITH IOL INSERTION
Anesthesia: Monitor Anesthesia Care | Site: Eye | Laterality: Right

## 2020-10-24 MED ORDER — TROPICAMIDE 1 % OP SOLN
1.0000 [drp] | OPHTHALMIC | Status: AC
  Administered 2020-10-24 (×3): 1 [drp] via OPHTHALMIC

## 2020-10-24 MED ORDER — SODIUM HYALURONATE 10 MG/ML IO SOLUTION
PREFILLED_SYRINGE | INTRAOCULAR | Status: DC | PRN
  Administered 2020-10-24: 0.85 mL via INTRAOCULAR

## 2020-10-24 MED ORDER — EPINEPHRINE PF 1 MG/ML IJ SOLN
INTRAOCULAR | Status: DC | PRN
  Administered 2020-10-24: 500 mL

## 2020-10-24 MED ORDER — PHENYLEPHRINE-KETOROLAC 1-0.3 % IO SOLN
INTRAOCULAR | Status: AC
  Filled 2020-10-24: qty 4

## 2020-10-24 MED ORDER — BSS IO SOLN
INTRAOCULAR | Status: DC | PRN
  Administered 2020-10-24: 15 mL via INTRAOCULAR

## 2020-10-24 MED ORDER — PHENYLEPHRINE HCL 2.5 % OP SOLN
1.0000 [drp] | OPHTHALMIC | Status: DC | PRN
  Administered 2020-10-24: 1 [drp] via OPHTHALMIC

## 2020-10-24 MED ORDER — STERILE WATER FOR IRRIGATION IR SOLN
Status: DC | PRN
  Administered 2020-10-24: 250 mL

## 2020-10-24 MED ORDER — SODIUM HYALURONATE 23MG/ML IO SOSY
PREFILLED_SYRINGE | INTRAOCULAR | Status: DC | PRN
  Administered 2020-10-24: 0.6 mL via INTRAOCULAR

## 2020-10-24 MED ORDER — POVIDONE-IODINE 5 % OP SOLN
OPHTHALMIC | Status: DC | PRN
  Administered 2020-10-24: 1 via OPHTHALMIC

## 2020-10-24 MED ORDER — NEOMYCIN-POLYMYXIN-DEXAMETH 3.5-10000-0.1 OP SUSP
OPHTHALMIC | Status: DC | PRN
  Administered 2020-10-24: 1 [drp] via OPHTHALMIC

## 2020-10-24 MED ORDER — TETRACAINE HCL 0.5 % OP SOLN
1.0000 [drp] | OPHTHALMIC | Status: AC | PRN
  Administered 2020-10-24 (×3): 1 [drp] via OPHTHALMIC

## 2020-10-24 MED ORDER — LIDOCAINE HCL (PF) 1 % IJ SOLN
INTRAOCULAR | Status: DC | PRN
  Administered 2020-10-24: 1 mL via OPHTHALMIC

## 2020-10-24 MED ORDER — LIDOCAINE HCL 3.5 % OP GEL: 1.0000 "application " | Freq: Once | OPHTHALMIC | Status: DC

## 2020-10-24 SURGICAL SUPPLY — 11 items
CLOTH BEACON ORANGE TIMEOUT ST (SAFETY) ×2 IMPLANT
EYE SHIELD UNIVERSAL CLEAR (GAUZE/BANDAGES/DRESSINGS) ×2 IMPLANT
GLOVE SURG UNDER POLY LF SZ6.5 (GLOVE) ×1 IMPLANT
GLOVE SURG UNDER POLY LF SZ7 (GLOVE) ×2 IMPLANT
NDL HYPO 18GX1.5 BLUNT FILL (NEEDLE) IMPLANT
NEEDLE HYPO 18GX1.5 BLUNT FILL (NEEDLE) ×2 IMPLANT
PAD ARMBOARD 7.5X6 YLW CONV (MISCELLANEOUS) ×2 IMPLANT
SYR TB 1ML LL NO SAFETY (SYRINGE) ×1 IMPLANT
TAPE PAPER MEDFIX 1IN X 10YD (GAUZE/BANDAGES/DRESSINGS) ×2 IMPLANT
TECNIS IOL (Intraocular Lens) ×2 IMPLANT
WATER STERILE IRR 250ML POUR (IV SOLUTION) ×1 IMPLANT

## 2020-10-24 NOTE — Anesthesia Postprocedure Evaluation (Signed)
Anesthesia Post Note  Patient: Katelyn Kaufman  Procedure(s) Performed: CATARACT EXTRACTION PHACO AND INTRAOCULAR LENS PLACEMENT (IOC) (Right Eye)  Patient location during evaluation: Short Stay Anesthesia Type: MAC Level of consciousness: awake and alert Pain management: pain level controlled Vital Signs Assessment: post-procedure vital signs reviewed and stable Respiratory status: spontaneous breathing Cardiovascular status: blood pressure returned to baseline and stable Postop Assessment: no apparent nausea or vomiting Anesthetic complications: no   No complications documented.   Last Vitals:  Vitals:   10/24/20 0949  BP: (!) 153/86  Pulse: 65  Resp: 15  Temp: 36.7 C  SpO2: 100%    Last Pain:  Vitals:   10/24/20 0949  TempSrc: Oral  PainSc: 0-No pain                 Jill Ruppe

## 2020-10-24 NOTE — Op Note (Signed)
Date of procedure: 10/24/20  Pre-operative diagnosis:  Visually significant combined form age-related cataract, Right Eye (H25.811)  Post-operative diagnosis:  Visually significant combined form age-related cataract, Right Eye (H25.811)  Procedure: Removal of cataract via phacoemulsification and insertion of intra-ocular lens Wynetta Emery and [employer] DCB00  +18.5D into the capsular bag of the Right Eye  Attending surgeon: Gerda Diss. Cleatis Fandrich, MD, MA  Anesthesia: MAC, Topical [place]  Complications: None  Estimated Blood Loss: <23m (minimal)  Specimens: None  Implants: As above  Indications:  Visually significant age-related cataract, Right Eye  Procedure:  The patient was seen and identified in the pre-operative area. The operative eye was identified and dilated.  The operative eye was marked.  Topical anesthesia was administered to the operative eye.     The patient was then to the operative suite and placed in the supine position.  A timeout was performed confirming the patient, procedure to be performed, and all other relevant information.   The patient's face was prepped and draped in the usual fashion for intra-ocular surgery.  A lid speculum was placed into the operative eye and the surgical microscope moved into place and focused.  A superotemporal paracentesis was created using a 20 gauge paracentesis blade.  Shugarcaine was injected into the anterior chamber.  Viscoelastic was injected into the anterior chamber.  A temporal clear-corneal main wound incision was created using a 2.420mmicrokeratome.  A continuous curvilinear capsulorrhexis was initiated using an irrigating cystitome and completed using capsulorrhexis forceps.  Hydrodissection and hydrodeliniation were performed.  Viscoelastic was injected into the anterior chamber.  A phacoemulsification handpiece and a chopper as a second instrument were used to remove the nucleus and epinucleus. The irrigation/aspiration handpiece was  used to remove any remaining cortical material.   The capsular bag was reinflated with viscoelastic, checked, and found to be intact.  The intraocular lens was inserted into the capsular bag.  The irrigation/aspiration handpiece was used to remove any remaining viscoelastic.  The clear corneal wound and paracentesis wounds were then hydrated and checked with Weck-Cels to be watertight.  The lid-speculum was removed.  The drape was removed.  The patient's face was cleaned with a wet and dry 4x4.   Maxitrol was instilled in the eye. A clear shield was taped over the eye. The patient was taken to the post-operative care unit in good condition, having tolerated the procedure well.  Post-Op Instructions: [facility] and will receive all other orders and instructions.

## 2020-10-24 NOTE — Discharge Instructions (Signed)
Please discharge patient when stable, will follow up today with Dr. Eldwin Volkov at the [facility] [place] office immediately following discharge.  Leave shield in place until visit.  All paperwork with discharge instructions will be given at the office.  [facility] Address:  [address]  , [place] [address]  

## 2020-10-24 NOTE — Interval H&P Note (Signed)
History and Physical Interval Note:  10/24/2020 10:34 AM  Katelyn Kaufman  has presented today for surgery, with the diagnosis of Nuclear sclerotic cataract - Right eye.    The patient has been consented and scheduled for the left eye, but the patient took her pre-operative drops in the right eye.  The same lens was to be used in each eye.  It was discussed with the patient and she consents to have cataract surgery on the RIGHT eye today.  he various methods of treatment have been discussed with the patient and family. After consideration of risks, benefits and other options for treatment, the patient has consented to  Procedure(s): CATARACT EXTRACTION PHACO AND INTRAOCULAR LENS PLACEMENT ([place]) (Right) as a surgical intervention.  The patient's history has been reviewed, patient examined, no change in status, stable for surgery.  I have reviewed the patient's chart and labs.  Questions were answered to the patient's satisfaction.     Baruch Goldmann

## 2020-10-24 NOTE — Transfer of Care (Signed)
Immediate Anesthesia Transfer of Care Note  Patient: Katelyn Kaufman  Procedure(s) Performed: CATARACT EXTRACTION PHACO AND INTRAOCULAR LENS PLACEMENT ([facility]) (Right Eye)  Patient Location: Short Stay  Anesthesia Type:MAC  Level of Consciousness: awake  Airway & Oxygen Therapy: Patient Spontanous Breathing  Post-op Assessment: Report given to RN  Post vital signs: Reviewed and stable  Last Vitals:  Vitals Value Taken Time  BP    Temp    Pulse    Resp    SpO2      Last Pain:  Vitals:   10/24/20 0949  TempSrc: Oral  PainSc: 0-No pain      Patients Stated Pain Goal: 5 (16/10/96 0454)  Complications: No complications documented.

## 2020-10-24 NOTE — Anesthesia Preprocedure Evaluation (Signed)
Anesthesia Evaluation  Patient identified by MRN, date of birth, ID band Patient awake    Reviewed: Allergy & Precautions, H&P , NPO status , Patient's Chart, lab work & pertinent test results, reviewed documented beta blocker date and time   Airway Mallampati: III  TM Distance: >3 FB Neck ROM: full    Dental no notable dental hx.    Pulmonary sleep apnea , COPD, Current Smoker,    Pulmonary exam normal breath sounds clear to auscultation       Cardiovascular Exercise Tolerance: Good hypertension, + CAD and + Past MI   Rhythm:regular Rate:Normal     Neuro/Psych negative neurological ROS  negative psych ROS   GI/Hepatic negative GI ROS, Neg liver ROS,   Endo/Other  negative endocrine ROSdiabetes, Type 2  Renal/GU negative Renal ROS  negative genitourinary   Musculoskeletal   Abdominal   Peds  Hematology negative hematology ROS (+)   Anesthesia Other Findings   Reproductive/Obstetrics negative OB ROS                             Anesthesia Physical Anesthesia Plan  ASA: III  Anesthesia Plan: MAC   Post-op Pain Management:    Induction:   PONV Risk Score and Plan:   Airway Management Planned:   Additional Equipment:   Intra-op Plan:   Post-operative Plan:   Informed Consent: I have reviewed the patients History and Physical, chart, labs and discussed the procedure including the risks, benefits and alternatives for the proposed anesthesia with the patient or authorized representative who has indicated his/her understanding and acceptance.     Dental Advisory Given  Plan Discussed with: CRNA  Anesthesia Plan Comments:         Anesthesia Quick Evaluation

## 2020-10-27 ENCOUNTER — Encounter (HOSPITAL_COMMUNITY): Payer: Self-pay | Admitting: Ophthalmology

## 2020-11-19 NOTE — H&P (Signed)
Surgical History & Physical  Patient Name: Katelyn Kaufman                                                               DOB: [birth year]  Surgery: Cataract extraction with intraocular lens implant phacoemulsification; Right Eye  Surgeon: Baruch Goldmann MD Surgery Date:  12/01/2020 Pre-Op Date:  11/03/2020  HPI: A 37 Yr. old female patient PO OD/Pre Op OS The patient is returning after cataract surgery. The right eye is affected. Status post cataract surgery, which began 1 week ago: Since the last visit, the affected area is doing well. The patient's vision is improved and stable. Patient is following medication instructions. Taking Imprimis TID OD. Pt denies any increase in floaters/flashes of light. The patient complains of difficulty when seeing street signs, which began 2 years ago. The left eye is affected. The episode is gradual. The condition's severity is worsening. The complaint is associated with glare. Symptoms are negatively affecting pt's quality of life. HPI Completed by Dr. Baruch Goldmann  Medical History: Cataracts Diabetes Heart Problem High Blood Pressure LDL  Review of Systems Negative Allergic/Immunologic Negative Cardiovascular Negative Constitutional Negative Ear, Nose, Mouth & Throat Negative Endocrine Negative Eyes Negative Gastrointestinal Negative Genitourinary Negative Hemotologic/Lymphatic Negative Integumentary Negative Musculoskeletal Negative Neurological Negative Psychiatry Negative Respiratory  Social   Current every day smoker  Medication Prednisolone-Moxifloxacin-Bromfenac,  Metformin, Lantus, Metoprolol, Statin,   Sx/Procedures Phaco c IOL,  Open Heart Sx, Lumpectomy, Knee Replacement (right), Back Surgery,   Drug Allergies   NKDA  History & Physical: Heent:  Cataract, Right eye NECK: supple without bruits LUNGS: lungs clear to auscultation CV: regular rate and rhythm Abdomen: soft and non-tender  Impression &  Plan: Assessment: 1.  CATARACT EXTRACTION STATUS; Right Eye (Z98.41) 2.  COMBINED FORMS AGE RELATED CATARACT; Right Eye (H25.811)  Plan: 1.  1 week after cataract surgery. Doing well with improved vision and normal eye pressure. Call with any problems or concerns. Continue Pred-Moxi-Brom 2x/day for 3 more weeks.  2.  Cataract accounts for the patient's decreased vision. This visual impairment is not correctable with a tolerable change in glasses or contact lenses. Cataract surgery with an implantation of a new lens should significantly improve the visual and functional status of the patient. Discussed all risks, benefits, alternatives, and potential complications. Discussed the procedures and recovery. Patient desires to have surgery. A-scan ordered and performed today for intra-ocular lens calculations. The surgery will be performed in order to improve vision for driving, reading, and for eye examinations. Recommend phacoemulsification with intra-ocular lens. Recommend Dextenza for post-operative pain and inflammation. Left Eye worse Dilates poorly - shugacaine by protocol. Omidira.

## 2020-11-20 DIAGNOSIS — E785 Hyperlipidemia, unspecified: Secondary | ICD-10-CM | POA: Diagnosis not present

## 2020-11-20 DIAGNOSIS — J449 Chronic obstructive pulmonary disease, unspecified: Secondary | ICD-10-CM | POA: Diagnosis not present

## 2020-11-20 DIAGNOSIS — I10 Essential (primary) hypertension: Secondary | ICD-10-CM | POA: Diagnosis not present

## 2020-11-20 DIAGNOSIS — E1121 Type 2 diabetes mellitus with diabetic nephropathy: Secondary | ICD-10-CM | POA: Diagnosis not present

## 2020-11-25 ENCOUNTER — Encounter (HOSPITAL_COMMUNITY)
Admit: 2020-11-25 | Discharge: 2020-11-25 | Disposition: A | Discharge status: Home / Self Care | Payer: Medicare Other | Attending: Ophthalmology | Admitting: Ophthalmology

## 2020-11-25 ENCOUNTER — Other Ambulatory Visit: Payer: Self-pay

## 2020-12-01 ENCOUNTER — Ambulatory Visit (HOSPITAL_COMMUNITY)
Admission: RE | Admit: 2020-12-01 | Discharge: 2020-12-01 | Disposition: A | Discharge status: Home / Self Care | Payer: Medicare Other | Attending: Ophthalmology | Admitting: Ophthalmology

## 2020-12-01 ENCOUNTER — Ambulatory Visit (HOSPITAL_COMMUNITY): Payer: Medicare Other | Admitting: Anesthesiology

## 2020-12-01 ENCOUNTER — Encounter (HOSPITAL_COMMUNITY)
Admission: RE | Disposition: A | Discharge status: Home / Self Care | Payer: Self-pay | Source: Home / Self Care | Attending: Ophthalmology

## 2020-12-01 ENCOUNTER — Encounter (HOSPITAL_COMMUNITY): Payer: Self-pay | Admitting: Ophthalmology

## 2020-12-01 ENCOUNTER — Other Ambulatory Visit: Payer: Self-pay

## 2020-12-01 DIAGNOSIS — F17200 Nicotine dependence, unspecified, uncomplicated: Secondary | ICD-10-CM | POA: Diagnosis not present

## 2020-12-01 DIAGNOSIS — E1136 Type 2 diabetes mellitus with diabetic cataract: Secondary | ICD-10-CM | POA: Diagnosis present

## 2020-12-01 DIAGNOSIS — Z9841 Cataract extraction status, right eye: Secondary | ICD-10-CM | POA: Insufficient documentation

## 2020-12-01 DIAGNOSIS — H25812 Combined forms of age-related cataract, left eye: Secondary | ICD-10-CM | POA: Insufficient documentation

## 2020-12-01 HISTORY — PX: CATARACT EXTRACTION W/PHACO: SHX586

## 2020-12-01 LAB — GLUCOSE, CAPILLARY: Glucose-Capillary: 127 mg/dL — ABNORMAL HIGH (ref 70–99)

## 2020-12-01 SURGERY — PHACOEMULSIFICATION, CATARACT, WITH IOL INSERTION
Anesthesia: Monitor Anesthesia Care | Site: Eye | Laterality: Left

## 2020-12-01 MED ORDER — LIDOCAINE HCL 3.5 % OP GEL
1.0000 "application " | Freq: Once | OPHTHALMIC | Status: AC
  Administered 2020-12-01: 1 via OPHTHALMIC

## 2020-12-01 MED ORDER — TETRACAINE HCL 0.5 % OP SOLN
1.0000 [drp] | OPHTHALMIC | Status: AC | PRN
  Administered 2020-12-01 (×3): 1 [drp] via OPHTHALMIC

## 2020-12-01 MED ORDER — SODIUM CHLORIDE 0.9% FLUSH
INTRAVENOUS | Status: DC | PRN
  Administered 2020-12-01: 5 mL via INTRAVENOUS

## 2020-12-01 MED ORDER — STERILE WATER FOR IRRIGATION IR SOLN
Status: DC | PRN
  Administered 2020-12-01: 250 mL

## 2020-12-01 MED ORDER — POVIDONE-IODINE 5 % OP SOLN
OPHTHALMIC | Status: DC | PRN
  Administered 2020-12-01: 1 via OPHTHALMIC

## 2020-12-01 MED ORDER — MIDAZOLAM HCL 2 MG/2ML IJ SOLN
INTRAMUSCULAR | Status: DC | PRN
  Administered 2020-12-01: 2 mg via INTRAVENOUS

## 2020-12-01 MED ORDER — NEOMYCIN-POLYMYXIN-DEXAMETH 3.5-10000-0.1 OP SUSP
OPHTHALMIC | Status: DC | PRN
  Administered 2020-12-01: 1 [drp] via OPHTHALMIC

## 2020-12-01 MED ORDER — BSS IO SOLN
INTRAOCULAR | Status: DC | PRN
  Administered 2020-12-01: 15 mL via INTRAOCULAR

## 2020-12-01 MED ORDER — LIDOCAINE HCL (PF) 1 % IJ SOLN
INTRAOCULAR | Status: DC | PRN
  Administered 2020-12-01: 1 mL via OPHTHALMIC

## 2020-12-01 MED ORDER — PHENYLEPHRINE HCL 2.5 % OP SOLN
1.0000 [drp] | OPHTHALMIC | Status: AC | PRN
  Administered 2020-12-01 (×3): 1 [drp] via OPHTHALMIC

## 2020-12-01 MED ORDER — PHENYLEPHRINE-KETOROLAC 1-0.3 % IO SOLN
INTRAOCULAR | Status: DC | PRN
  Administered 2020-12-01: 500 mL via OPHTHALMIC

## 2020-12-01 MED ORDER — PHENYLEPHRINE-KETOROLAC 1-0.3 % IO SOLN
INTRAOCULAR | Status: AC
  Filled 2020-12-01: qty 4

## 2020-12-01 MED ORDER — TROPICAMIDE 1 % OP SOLN
1.0000 [drp] | OPHTHALMIC | Status: AC
  Administered 2020-12-01 (×3): 1 [drp] via OPHTHALMIC

## 2020-12-01 MED ORDER — SODIUM HYALURONATE 23MG/ML IO SOSY
PREFILLED_SYRINGE | INTRAOCULAR | Status: DC | PRN
  Administered 2020-12-01: 0.6 mL via INTRAOCULAR

## 2020-12-01 MED ORDER — EPINEPHRINE PF 1 MG/ML IJ SOLN
INTRAMUSCULAR | Status: AC
  Filled 2020-12-01: qty 1

## 2020-12-01 MED ORDER — SODIUM HYALURONATE 10 MG/ML IO SOLUTION
PREFILLED_SYRINGE | INTRAOCULAR | Status: DC | PRN
  Administered 2020-12-01: 0.85 mL via INTRAOCULAR

## 2020-12-01 MED ORDER — MIDAZOLAM HCL 2 MG/2ML IJ SOLN
INTRAMUSCULAR | Status: AC
  Filled 2020-12-01: qty 2

## 2020-12-01 SURGICAL SUPPLY — 15 items
CLOTH BEACON ORANGE TIMEOUT ST (SAFETY) ×1 IMPLANT
DRAPE HALF SHEET 40X57 (DRAPES) ×1 IMPLANT
EYE SHIELD UNIVERSAL CLEAR (GAUZE/BANDAGES/DRESSINGS) ×1 IMPLANT
GLOVE SURG UNDER POLY LF SZ6.5 (GLOVE) ×1 IMPLANT
GLOVE SURG UNDER POLY LF SZ7 (GLOVE) ×1 IMPLANT
GOWN STRL REUS W/ TWL LRG LVL3 (GOWN DISPOSABLE) IMPLANT
GOWN STRL REUS W/TWL LRG LVL3 (GOWN DISPOSABLE) ×2
MARKER SKIN DUAL TIP RULER LAB (MISCELLANEOUS) ×1 IMPLANT
NDL HYPO 18GX1.5 BLUNT FILL (NEEDLE) IMPLANT
NEEDLE HYPO 18GX1.5 BLUNT FILL (NEEDLE) ×2 IMPLANT
PAD ARMBOARD 7.5X6 YLW CONV (MISCELLANEOUS) ×1 IMPLANT
TAPE SURG TRANSPORE 1 IN (GAUZE/BANDAGES/DRESSINGS) IMPLANT
TAPE SURGICAL TRANSPORE 1 IN (GAUZE/BANDAGES/DRESSINGS) ×2
TECNIS 1-PIECE IOL (Intraocular Lens) ×1 IMPLANT
WATER STERILE IRR 250ML POUR (IV SOLUTION) ×1 IMPLANT

## 2020-12-01 NOTE — Op Note (Signed)
Date of procedure: 12/01/20  Pre-operative diagnosis: Visually significant age-related combined cataract, Left Eye (H25.812)  Post-operative diagnosis: Visually significant age-related combined cataract, Left Eye (H25.812)  Procedure: Removal of cataract via phacoemulsification and insertion of intra-ocular lens Wynetta Emery and [employer]  +18.5D into the capsular bag of the Left Eye  Attending surgeon: Gerda Diss. Kiele Heavrin, MD, MA  Anesthesia: MAC, Topical [place]  Complications: None  Estimated Blood Loss: <13m (minimal)  Specimens: None  Implants: As above  Indications:  Visually significant age-related cataract, Left Eye  Procedure:  The patient was seen and identified in the pre-operative area. The operative eye was identified and dilated.  The operative eye was marked.  Topical anesthesia was administered to the operative eye.     The patient was then to the operative suite and placed in the supine position.  A timeout was performed confirming the patient, procedure to be performed, and all other relevant information.   The patient's face was prepped and draped in the usual fashion for intra-ocular surgery.  A lid speculum was placed into the operative eye and the surgical microscope moved into place and focused.  An inferotemporal paracentesis was created using a 20 gauge paracentesis blade.  Shugarcaine was injected into the anterior chamber.  Viscoelastic was injected into the anterior chamber.  A temporal clear-corneal main wound incision was created using a 2.417mmicrokeratome.  A continuous curvilinear capsulorrhexis was initiated using an irrigating cystitome and completed using capsulorrhexis forceps.  Hydrodissection and hydrodeliniation were performed.  Viscoelastic was injected into the anterior chamber.  A phacoemulsification handpiece and a chopper as a second instrument were used to remove the nucleus and epinucleus. The irrigation/aspiration handpiece was used to remove  any remaining cortical material.   The capsular bag was reinflated with viscoelastic, checked, and found to be intact.  The intraocular lens was inserted into the capsular bag.  The irrigation/aspiration handpiece was used to remove any remaining viscoelastic.  The clear corneal wound and paracentesis wounds were then hydrated and checked with Weck-Cels to be watertight.  The lid-speculum was removed.  The drape was removed.  The patient's face was cleaned with a wet and dry 4x4.   Maxitrol was instilled in the eye. A clear shield was taped over the eye. The patient was taken to the post-operative care unit in good condition, having tolerated the procedure well.  Post-Op Instructions: [facility] and will receive all other orders and instructions.

## 2020-12-01 NOTE — H&P (Signed)
Surgical History & Physical   Patient Name: Katelyn Kaufman                                                               DOB: [birth year]   Surgery: Cataract extraction with intraocular lens implant phacoemulsification; Left Eye   Surgeon: Baruch Goldmann MD Surgery Date:  12/01/2020 Pre-Op Date:  11/03/2020   HPI: A 42 Yr. old female patient PO OD/Pre Op OS The patient is returning after cataract surgery. The right eye is affected. Status post cataract surgery, which began 1 week ago: Since the last visit, the affected area is doing well. The patient's vision is improved and stable. Patient is following medication instructions. Taking Imprimis TID OD. Pt denies any increase in floaters/flashes of light. The patient complains of difficulty when seeing street signs, which began 2 years ago. The left eye is affected. The episode is gradual. The condition's severity is worsening. The complaint is associated with glare. Symptoms are negatively affecting pt's quality of life. HPI Completed by Dr. Baruch Goldmann   Medical History: Cataracts Diabetes Heart Problem High Blood Pressure LDL   Review of Systems Negative Allergic/Immunologic Negative Cardiovascular Negative Constitutional Negative Ear, Nose, Mouth & Throat Negative Endocrine Negative Eyes Negative Gastrointestinal Negative Genitourinary Negative Hemotologic/Lymphatic Negative Integumentary Negative Musculoskeletal Negative Neurological Negative Psychiatry Negative Respiratory   Social   Current every day smoker   Medication Prednisolone-Moxifloxacin-Bromfenac, Metformin, Lantus, Metoprolol, Statin,   Sx/Procedures Phaco c IOL, Open Heart Sx, Lumpectomy, Knee Replacement (right), Back Surgery,   Drug Allergies   NKDA   History & Physical: Heent:  Cataract, Right eye NECK: supple without bruits LUNGS: lungs clear to auscultation CV: regular rate and rhythm Abdomen: soft and non-tender   Impression &  Plan: Assessment: 1.         CATARACT EXTRACTION STATUS; Right Eye (Z98.41) 2.         COMBINED FORMS AGE RELATED CATARACT; Left Eye (H25.812)   Plan: 1.  1 week after cataract surgery. Doing well with improved vision and normal eye pressure. Call with any problems or concerns. Continue Pred-Moxi-Brom 2x/day for 3 more weeks.   2.  Cataract accounts for the patient's decreased vision. This visual impairment is not correctable with a tolerable change in glasses or contact lenses. Cataract surgery with an implantation of a new lens should significantly improve the visual and functional status of the patient. Discussed all risks, benefits, alternatives, and potential complications. Discussed the procedures and recovery. Patient desires to have surgery. A-scan ordered and performed today for intra-ocular lens calculations. The surgery will be performed in order to improve vision for driving, reading, and for eye examinations. Recommend phacoemulsification with intra-ocular lens. Recommend Dextenza for post-operative pain and inflammation. Left Eye  Dilates poorly - shugacaine by protocol. Omidira.

## 2020-12-01 NOTE — Progress Notes (Signed)
Confirmed with patient and Dr. Marisa Hua that the LEFT EYE is the operative eye today.

## 2020-12-01 NOTE — Transfer of Care (Signed)
Immediate Anesthesia Transfer of Care Note  Patient: Katelyn Kaufman  Procedure(s) Performed: CATARACT EXTRACTION PHACO AND INTRAOCULAR LENS PLACEMENT LEFT EYE (Left: Eye)  Patient Location: Short Stay  Anesthesia Type:MAC  Level of Consciousness: awake and patient cooperative  Airway & Oxygen Therapy: Patient Spontanous Breathing  Post-op Assessment: Report given to RN and Post -op Vital signs reviewed and stable  Post vital signs: Reviewed and stable  Last Vitals:  Vitals Value Taken Time  BP    Temp    Pulse    Resp    SpO2     SEE VITAL SIGN FLOW SHEET Last Pain:  Vitals:   12/01/20 1010  TempSrc: Oral  PainSc: 0-No pain         Complications: No notable events documented.

## 2020-12-01 NOTE — Anesthesia Preprocedure Evaluation (Signed)
Anesthesia Evaluation  Patient identified by MRN, date of birth, ID band Patient awake    Reviewed: Allergy & Precautions, H&P , NPO status , Patient's Chart, lab work & pertinent test results, reviewed documented beta blocker date and time   Airway Mallampati: III  TM Distance: >3 FB Neck ROM: full    Dental no notable dental hx.    Pulmonary sleep apnea , COPD, Current Smoker,    Pulmonary exam normal breath sounds clear to auscultation       Cardiovascular Exercise Tolerance: Good hypertension, + CAD and + Past MI   Rhythm:regular Rate:Normal     Neuro/Psych negative neurological ROS  negative psych ROS   GI/Hepatic negative GI ROS, Neg liver ROS,   Endo/Other  negative endocrine ROSdiabetes, Type 2  Renal/GU negative Renal ROS  negative genitourinary   Musculoskeletal   Abdominal   Peds  Hematology negative hematology ROS (+)   Anesthesia Other Findings   Reproductive/Obstetrics negative OB ROS                             Anesthesia Physical  Anesthesia Plan  ASA: III  Anesthesia Plan: MAC   Post-op Pain Management:    Induction:   PONV Risk Score and Plan:   Airway Management Planned:   Additional Equipment:   Intra-op Plan:   Post-operative Plan:   Informed Consent: I have reviewed the patients History and Physical, chart, labs and discussed the procedure including the risks, benefits and alternatives for the proposed anesthesia with the patient or authorized representative who has indicated his/her understanding and acceptance.     Dental Advisory Given  Plan Discussed with: CRNA  Anesthesia Plan Comments:         Anesthesia Quick Evaluation

## 2020-12-01 NOTE — OR Nursing (Signed)
Patient confirmed that we are doing left eye today and that the right eye surgery was performed last time. Consent and H&P confirms that the surgical eye today is the left.  Booking says right , needs  to be changed to left. Confirmed with Dr. Janyth Pupa that we are doing the left eye today.  Patient and Dr. All in agreement.

## 2020-12-01 NOTE — Anesthesia Procedure Notes (Signed)
Procedure Name: MAC Date/Time: 12/01/2020 10:56 AM Performed by: Vista Deck, CRNA Pre-anesthesia Checklist: Patient identified, Emergency Drugs available, Suction available, Timeout performed and Patient being monitored Patient Re-evaluated:Patient Re-evaluated prior to induction Oxygen Delivery Method: Nasal Cannula

## 2020-12-01 NOTE — Interval H&P Note (Signed)
History and Physical Interval Note:  12/01/2020 10:54 AM  Katelyn Kaufman  has presented today for surgery, with the diagnosis of Nuclear sclerotic cataract - Left eye.  The various methods of treatment have been discussed with the patient and family. After consideration of risks, benefits and other options for treatment, the patient has consented to  Procedure(s): CATARACT EXTRACTION PHACO AND INTRAOCULAR LENS PLACEMENT LEFT EYE (Left) as a surgical intervention.  The patient's history has been reviewed, patient examined, no change in status, stable for surgery.  I have reviewed the patient's chart and labs.  Questions were answered to the patient's satisfaction.     Baruch Goldmann

## 2020-12-01 NOTE — Interval H&P Note (Deleted)
History and Physical Interval Note:  12/01/2020 10:42 AM  Katelyn Kaufman  has presented today for surgery, with the diagnosis of Nuclear sclerotic cataract - Right eye.  The various methods of treatment have been discussed with the patient and family. After consideration of risks, benefits and other options for treatment, the patient has consented to  Procedure(s) with comments: CATARACT EXTRACTION PHACO AND INTRAOCULAR LENS PLACEMENT ([facility]) (Right) - right as a surgical intervention.  The patient's history has been reviewed, patient examined, no change in status, stable for surgery.  I have reviewed the patient's chart and labs.  Questions were answered to the patient's satisfaction.     Baruch Goldmann

## 2020-12-01 NOTE — Anesthesia Postprocedure Evaluation (Signed)
Anesthesia Post Note  Patient: HUYEN PERAZZO  Procedure(s) Performed: CATARACT EXTRACTION PHACO AND INTRAOCULAR LENS PLACEMENT LEFT EYE (Left: Eye)  Patient location during evaluation: Phase II Anesthesia Type: MAC Level of consciousness: awake Pain management: pain level controlled Vital Signs Assessment: post-procedure vital signs reviewed and stable Respiratory status: spontaneous breathing and respiratory function stable Cardiovascular status: blood pressure returned to baseline and stable Postop Assessment: no headache and no apparent nausea or vomiting Anesthetic complications: no Comments: Late entry   No notable events documented.   Last Vitals:  Vitals:   12/01/20 1010 12/01/20 1118  BP: (!) 175/79 (!) 181/95  Pulse: 68   Resp: 15 14  Temp: 37 C 36.6 C  SpO2: 98% 100%    Last Pain:  Vitals:   12/01/20 1118  TempSrc: Oral  PainSc: 0-No pain                 Louann Sjogren

## 2020-12-01 NOTE — Discharge Instructions (Addendum)
Please discharge patient when stable, will follow up today with Dr. Wrzosek at the [facility] [place] office immediately following discharge.  Leave shield in place until visit.  All paperwork with discharge instructions will be given at the office.  [facility] Address:  [address]  , [place] [address]  

## 2020-12-05 ENCOUNTER — Encounter (HOSPITAL_COMMUNITY): Payer: Self-pay | Admitting: Ophthalmology

## 2020-12-05 ENCOUNTER — Ambulatory Visit: Payer: Medicare Other | Admitting: Cardiology

## 2020-12-05 NOTE — Progress Notes (Deleted)
Cardiology Office Note  Date: 12/05/2020   ID: Katelyn Kaufman, DOB [birth year], MRN [id number]  PCP:  Neale Burly, MD  Cardiologist:  Rozann Lesches, MD Electrophysiologist:  None   No chief complaint on file.   History of Present Illness: Katelyn Kaufman is a 70 y.o. female last seen in February.  Cardiac testing from March as outlined below.  Lexiscan Myoview showed no significant ischemic territories and echocardiogram revealed LVEF 55 to 60% with mild LVH and no regional wall motion abnormalities.  Past Medical History:  Diagnosis Date   Arthritis    CAD (coronary artery disease) 2018   COPD (chronic obstructive pulmonary disease) ([place])    Essential hypertension    Hyperlipidemia    Myocardial infarction (HCC)    OSA (obstructive sleep apnea)    Type 2 diabetes mellitus ([place])     Past Surgical History:  Procedure Laterality Date   BACK SURGERY     BREAST SURGERY     CATARACT EXTRACTION W/PHACO Right 10/24/2020   Procedure: CATARACT EXTRACTION PHACO AND INTRAOCULAR LENS PLACEMENT ([place]);  Surgeon: Baruch Goldmann, MD;  Location: [facility];  Service: Ophthalmology;  Laterality: Right;  CDE: 10.87   CATARACT EXTRACTION W/PHACO Left 12/01/2020   Procedure: CATARACT EXTRACTION PHACO AND INTRAOCULAR LENS PLACEMENT LEFT EYE;  Surgeon: Baruch Goldmann, MD;  Location: [facility];  Service: Ophthalmology;  Laterality: Left;  CDE  8.62   REPLACEMENT TOTAL KNEE Right    TUBAL LIGATION      Current Outpatient Medications  Medication Sig Dispense Refill   Ascorbic Acid (VITAMIN C) 1000 MG tablet Take 1,000 mg by mouth daily.     aspirin EC 81 MG tablet Take 81 mg by mouth daily. Swallow whole.     Calcium Carb-Cholecalciferol (CALCIUM-VITAMIN D3) 600-400 MG-UNIT TABS Take 1 capsule by mouth daily.     carvedilol (COREG) 25 MG tablet Take 25 mg by mouth 2 (two) times daily with a meal.     cholecalciferol (VITAMIN D3) 25 MCG (1000 UNIT) tablet Take 1,000 Units by mouth daily.      clopidogrel (PLAVIX) 75 MG tablet Take 1 tablet (75 mg total) by mouth daily. 90 tablet 3   cyclobenzaprine (FLEXERIL) 10 MG tablet Take 10 mg by mouth in the morning and at bedtime.     DULoxetine (CYMBALTA) 60 MG capsule Take 60 mg by mouth 2 (two) times daily.     ezetimibe (ZETIA) 10 MG tablet Take 10 mg by mouth daily.     furosemide (LASIX) 40 MG tablet Take 40 mg by mouth daily.     glimepiride (AMARYL) 4 MG tablet Take 4 mg by mouth in the morning and at bedtime.     insulin glargine (LANTUS) 100 UNIT/ML injection Inject 40 Units into the skin at bedtime.     isosorbide mononitrate (IMDUR) 30 MG 24 hr tablet Take 30 mg by mouth daily.     losartan (COZAAR) 50 MG tablet Take 50 mg by mouth daily.     metFORMIN (GLUCOPHAGE) 500 MG tablet Take 500 mg by mouth 2 (two) times daily with a meal.     Omega-3 Fatty Acids (DIALYVITE OMEGA-3 CONCENTRATE) 600 MG CAPS Take 600 mg by mouth daily.     oxyCODONE-acetaminophen (PERCOCET) 7.5-325 MG tablet Take 1.5 tablets by mouth in the morning, at noon, in the evening, and at bedtime. 7am, 12pm, 5pm, 10pm     rosuvastatin (CRESTOR) 10 MG tablet Take 10 mg by mouth daily.  vitamin B-12 (CYANOCOBALAMIN) 1000 MCG tablet Take 1,000 mcg by mouth daily.     No current facility-administered medications for this visit.   Allergies:  Latex   Social History: The patient  reports that she has been smoking cigarettes. She has a 20.00 pack-year smoking history. She has never used smokeless tobacco. She reports that she does not drink alcohol and does not use drugs.   Family History: The patient's family history includes Alcohol abuse in her father; COPD in her mother; Heart attack in her brother; Hypertension in her sister.   ROS:  Please see the history of present illness. Otherwise, complete review of systems is positive for {NONE DEFAULTED:18576}.  All other systems are reviewed and negative.   Physical Exam: VS:  There were no vitals taken for this  visit., BMI There is no height or weight on file to calculate BMI.  Wt Readings from Last 3 Encounters:  11/25/20 243 lb 6.2 oz (110.4 kg)  07/08/20 244 lb (110.7 kg)  03/04/20 230 lb (104.3 kg)    General: Patient appears comfortable at rest. HEENT: Conjunctiva and lids normal, oropharynx clear with moist mucosa. Neck: Supple, no elevated JVP or carotid bruits, no thyromegaly. Lungs: Clear to auscultation, nonlabored breathing at rest. Cardiac: Regular rate and rhythm, no S3 or significant systolic murmur, no pericardial rub. Abdomen: Soft, nontender, no hepatomegaly, bowel sounds present, no guarding or rebound. Extremities: No pitting edema, distal pulses 2+. Skin: Warm and dry. Musculoskeletal: No kyphosis. Neuropsychiatric: Alert and oriented x3, affect grossly appropriate.  ECG:  An ECG dated 07/08/2020 was personally reviewed today and demonstrated:  SInus rhythm.  Recent Labwork: 03/04/2020: ALT 16; AST 16; BUN 12; Creatinine, Ser 0.99; Hemoglobin 12.3; Platelets 207; Potassium 4.5; Sodium 138   Other Studies Reviewed Today:  Lexiscan Myoview 08/14/2020: Lexiscan stress is electrically negative for ischemia Myovue scan shows probable normal perfusion and mild soft tissue attenuation (breast) No significant ischemia or scar. LVEF calculated at 51% with normal wall motion Ovreall low risk scan  Echocardiogram 08/14/2020:  1. Left ventricular ejection fraction, by estimation, is 55 to 60%. The  left ventricle has normal function. The left ventricle has no regional  wall motion abnormalities. There is mild left ventricular hypertrophy.  Left ventricular diastolic parameters  are indeterminate.   2. Right ventricular systolic function is normal. The right ventricular  size is normal.   3. Trivial mitral valve regurgitation.   4. The inferior vena cava is normal in size with greater than 50%  respiratory variability, suggesting right atrial pressure of 3 mmHg.   Assessment  and Plan:    Medication Adjustments/Labs and Tests Ordered: Current medicines are reviewed at length with the patient today.  Concerns regarding medicines are outlined above.   Tests Ordered: No orders of the defined types were placed in this encounter.   Medication Changes: No orders of the defined types were placed in this encounter.   Disposition:  Follow up {follow up:15908}  Signed, Satira Sark, MD, [facility] 12/05/2020 1:00 PM    [place] at [place], [place], [place] [address] Phone: [phone]; Fax: [phone]

## 2021-06-03 ENCOUNTER — Telehealth: Payer: Self-pay | Admitting: Cardiology

## 2021-06-03 NOTE — Telephone Encounter (Signed)
Pt c/o of Chest Pain: STAT if CP now or developed within 24 hours  1. Are you having CP right now? no  2. Are you experiencing any other symptoms (ex. SOB, nausea, vomiting, sweating)? Sob and sweating   3. How long have you been experiencing CP? Last two days  4. Is your CP continuous or coming and going? Coming and going   5. Have you taken Nitroglycerin? No  Patient has an appt 06/08/21 ?

## 2021-06-03 NOTE — Telephone Encounter (Signed)
No answer.  Last seen 07/08/2020 with normal / reassuring Echo & Stress Test results.  Appointment was given by schedulers for 06/08/2021.

## 2021-06-08 ENCOUNTER — Ambulatory Visit: Payer: Medicare HMO | Admitting: Cardiology

## 2021-06-08 NOTE — Telephone Encounter (Signed)
Pt n/s 06/08/21 appt with Dr. Domenic Polite.

## 2021-06-08 NOTE — Progress Notes (Deleted)
Cardiology Office Note  Date: 06/08/2021   ID: Katelyn Kaufman, DOB May 19, 1951, MRN [id number]  PCP:  Neale Burly, MD  Cardiologist:  Rozann Lesches, MD Electrophysiologist:  None   No chief complaint on file.   History of Present Illness: Katelyn Kaufman is a 71 y.o. female last seen in February 2022.  Cardiac structural and ischemic testing from March 2022 outlined below.  Past Medical History:  Diagnosis Date   Arthritis    CAD (coronary artery disease) 2018   COPD (chronic obstructive pulmonary disease) ([place])    Essential hypertension    Hyperlipidemia    Myocardial infarction (HCC)    OSA (obstructive sleep apnea)    Type 2 diabetes mellitus ([place])     Past Surgical History:  Procedure Laterality Date   BACK SURGERY     BREAST SURGERY     CATARACT EXTRACTION W/PHACO Right 10/24/2020   Procedure: CATARACT EXTRACTION PHACO AND INTRAOCULAR LENS PLACEMENT ([place]);  Surgeon: Baruch Goldmann, MD;  Location: [facility];  Service: Ophthalmology;  Laterality: Right;  CDE: 10.87   CATARACT EXTRACTION W/PHACO Left 12/01/2020   Procedure: CATARACT EXTRACTION PHACO AND INTRAOCULAR LENS PLACEMENT LEFT EYE;  Surgeon: Baruch Goldmann, MD;  Location: [facility];  Service: Ophthalmology;  Laterality: Left;  CDE  8.62   REPLACEMENT TOTAL KNEE Right    TUBAL LIGATION      Current Outpatient Medications  Medication Sig Dispense Refill   Ascorbic Acid (VITAMIN C) 1000 MG tablet Take 1,000 mg by mouth daily.     aspirin EC 81 MG tablet Take 81 mg by mouth daily. Swallow whole.     Calcium Carb-Cholecalciferol (CALCIUM-VITAMIN D3) 600-400 MG-UNIT TABS Take 1 capsule by mouth daily.     carvedilol (COREG) 25 MG tablet Take 25 mg by mouth 2 (two) times daily with a meal.     cholecalciferol (VITAMIN D3) 25 MCG (1000 UNIT) tablet Take 1,000 Units by mouth daily.     clopidogrel (PLAVIX) 75 MG tablet Take 1 tablet (75 mg total) by mouth daily. 90 tablet 3   cyclobenzaprine (FLEXERIL) 10 MG tablet  Take 10 mg by mouth in the morning and at bedtime.     DULoxetine (CYMBALTA) 60 MG capsule Take 60 mg by mouth 2 (two) times daily.     ezetimibe (ZETIA) 10 MG tablet Take 10 mg by mouth daily.     furosemide (LASIX) 40 MG tablet Take 40 mg by mouth daily.     glimepiride (AMARYL) 4 MG tablet Take 4 mg by mouth in the morning and at bedtime.     insulin glargine (LANTUS) 100 UNIT/ML injection Inject 40 Units into the skin at bedtime.     isosorbide mononitrate (IMDUR) 30 MG 24 hr tablet Take 30 mg by mouth daily.     losartan (COZAAR) 50 MG tablet Take 50 mg by mouth daily.     metFORMIN (GLUCOPHAGE) 500 MG tablet Take 500 mg by mouth 2 (two) times daily with a meal.     Omega-3 Fatty Acids (DIALYVITE OMEGA-3 CONCENTRATE) 600 MG CAPS Take 600 mg by mouth daily.     oxyCODONE-acetaminophen (PERCOCET) 7.5-325 MG tablet Take 1.5 tablets by mouth in the morning, at noon, in the evening, and at bedtime. 7am, 12pm, 5pm, 10pm     rosuvastatin (CRESTOR) 10 MG tablet Take 10 mg by mouth daily.     vitamin B-12 (CYANOCOBALAMIN) 1000 MCG tablet Take 1,000 mcg by mouth daily.     No  current facility-administered medications for this visit.   Allergies:  Latex   Social History: The patient  reports that she has been smoking cigarettes. She has a 20.00 pack-year smoking history. She has never used smokeless tobacco. She reports that she does not drink alcohol and does not use drugs.   Family History: The patient's family history includes Alcohol abuse in her father; COPD in her mother; Heart attack in her brother; Hypertension in her sister.   ROS:  Please see the history of present illness. Otherwise, complete review of systems is positive for {NONE DEFAULTED:18576}.  All other systems are reviewed and negative.   Physical Exam: VS:  There were no vitals taken for this visit., BMI There is no height or weight on file to calculate BMI.  Wt Readings from Last 3 Encounters:  11/25/20 243 lb 6.2 oz (110.4  kg)  07/08/20 244 lb (110.7 kg)  03/04/20 230 lb (104.3 kg)    General: Patient appears comfortable at rest. HEENT: Conjunctiva and lids normal, oropharynx clear with moist mucosa. Neck: Supple, no elevated JVP or carotid bruits, no thyromegaly. Lungs: Clear to auscultation, nonlabored breathing at rest. Cardiac: Regular rate and rhythm, no S3 or significant systolic murmur, no pericardial rub. Abdomen: Soft, nontender, no hepatomegaly, bowel sounds present, no guarding or rebound. Extremities: No pitting edema, distal pulses 2+. Skin: Warm and dry. Musculoskeletal: No kyphosis. Neuropsychiatric: Alert and oriented x3, affect grossly appropriate.  ECG:  An ECG dated 07/08/2020 was personally reviewed today and demonstrated:  Sinus rhythm.  Recent Labwork:  03/04/2020: ALT 16; AST 16; BUN 12; Creatinine, Ser 0.99; Hemoglobin 12.3; Platelets 207; Potassium 4.5; Sodium 138   Other Studies Reviewed Today:  Echocardiogram 08/14/2020:  1. Left ventricular ejection fraction, by estimation, is 55 to 60%. The  left ventricle has normal function. The left ventricle has no regional  wall motion abnormalities. There is mild left ventricular hypertrophy.  Left ventricular diastolic parameters  are indeterminate.   2. Right ventricular systolic function is normal. The right ventricular  size is normal.   3. Trivial mitral valve regurgitation.   4. The inferior vena cava is normal in size with greater than 50%  respiratory variability, suggesting right atrial pressure of 3 mmHg.  Lexiscan Myoview 08/14/2020: Lexiscan stress is electrically negative for ischemia Myovue scan shows probable normal perfusion and mild soft tissue attenuation (breast) No significant ischemia or scar. LVEF calculated at 51% with normal wall motion Ovreall low risk scan  Assessment and Plan:    Medication Adjustments/Labs and Tests Ordered: Current medicines are reviewed at length with the patient today.   Concerns regarding medicines are outlined above.   Tests Ordered: No orders of the defined types were placed in this encounter.   Medication Changes: No orders of the defined types were placed in this encounter.   Disposition:  Follow up {follow up:15908}  Signed, Satira Sark, MD, [facility] 06/08/2021 11:05 AM    [place] at [place], [place], [place] [address] Phone: [phone]; Fax: [phone]

## 2021-06-09 NOTE — Telephone Encounter (Signed)
Per care everywhere - looks like she went to [facility] on 06/04/2021.

## 2021-07-27 ENCOUNTER — Telehealth: Payer: Self-pay | Admitting: Cardiology

## 2021-07-27 NOTE — Telephone Encounter (Signed)
Pt c/o of Chest Pain: STAT if CP now or developed within 24 hours ? ?1. Are you having CP right now? Not at this exact time ? ?2. Are you experiencing any other symptoms (ex. SOB, nausea, vomiting, sweating)? A lot of shortness of breath, not at this time, feet and arm also been swelling ? ?3. How long have you been experiencing CP? about a week ? ?4. Is your CP continuous or coming and going? Comes and goes ? ?5. Have you taken Nitroglycerin? No- patient wanted to be seen- I made an appointment for tomorrow with Ermalinda Barrios- please call to evaluateo ?? ? ?

## 2021-07-27 NOTE — Telephone Encounter (Addendum)
Patient not sure if she can get transportation for visit tomorrow. ? ? ? ?She reports congestion since her pneumonia vaccine in December. Intermittent CP and SOB with exertion over the past week or so. She says it lasts 15-20 minutes at a time and occurs several times a day.Typically relieved by resting. No pain at this time. She also notes swelling in her feet and arms. Her home weight is 240 lbs (last recorded in office was 243 lbs)  ? ? ?Confirmed she is taking Imdur 30 mg qd,Lasix 40 mg qd, losartan 50 mg qd. She does not have NTG . ? ? ? ? ?I will FYI Dr.McDowell. ?

## 2021-07-28 ENCOUNTER — Ambulatory Visit: Payer: Medicare HMO | Admitting: Physician Assistant

## 2021-07-28 ENCOUNTER — Encounter: Payer: Self-pay | Admitting: Physician Assistant

## 2021-07-28 ENCOUNTER — Other Ambulatory Visit (HOSPITAL_COMMUNITY)
Admission: RE | Admit: 2021-07-28 | Discharge: 2021-07-28 | Disposition: A | Discharge status: Home / Self Care | Payer: Medicare HMO | Source: Ambulatory Visit | Attending: Physician Assistant | Admitting: Physician Assistant

## 2021-07-28 ENCOUNTER — Other Ambulatory Visit: Payer: Self-pay

## 2021-07-28 VITALS — BP 130/82 | HR 70 | Ht 63.0 in | Wt 262.0 lb

## 2021-07-28 DIAGNOSIS — I10 Essential (primary) hypertension: Secondary | ICD-10-CM | POA: Diagnosis present

## 2021-07-28 DIAGNOSIS — E782 Mixed hyperlipidemia: Secondary | ICD-10-CM

## 2021-07-28 DIAGNOSIS — I5033 Acute on chronic diastolic (congestive) heart failure: Secondary | ICD-10-CM | POA: Diagnosis present

## 2021-07-28 DIAGNOSIS — I2510 Atherosclerotic heart disease of native coronary artery without angina pectoris: Secondary | ICD-10-CM | POA: Diagnosis not present

## 2021-07-28 LAB — BASIC METABOLIC PANEL
Anion gap: 5 (ref 5–15)
BUN: 15 mg/dL (ref 8–23)
CO2: 26 mmol/L (ref 22–32)
Calcium: 9.7 mg/dL (ref 8.9–10.3)
Chloride: 107 mmol/L (ref 98–111)
Creatinine, Ser: 1.09 mg/dL — ABNORMAL HIGH (ref 0.44–1.00)
GFR, Estimated: 55 mL/min — ABNORMAL LOW (ref >60)
Glucose, Bld: 102 mg/dL — ABNORMAL HIGH (ref 70–99)
Potassium: 4.4 mmol/L (ref 3.5–5.1)
Sodium: 138 mmol/L (ref 135–145)

## 2021-07-28 LAB — BRAIN NATRIURETIC PEPTIDE: B Natriuretic Peptide: 86 pg/mL (ref 0.0–100.0)

## 2021-07-28 MED ORDER — NITROGLYCERIN 0.4 MG SL SUBL: 0.4000 mg | SUBLINGUAL_TABLET | SUBLINGUAL | 3 refills | Status: DC | PRN

## 2021-07-28 MED ORDER — ISOSORBIDE MONONITRATE ER 60 MG PO TB24: 60.0000 mg | ORAL_TABLET | Freq: Every day | ORAL | 3 refills | Status: DC

## 2021-07-28 MED ORDER — FUROSEMIDE 40 MG PO TABS: 60.0000 mg | ORAL_TABLET | Freq: Every day | ORAL | 3 refills | Status: DC

## 2021-07-28 NOTE — Patient Instructions (Signed)
Medication Instructions:   Lasix 80 mg for 3 day then reduce to 60 mg Daily  Increase Imdur to 60 mg Daily  You have been given Nitro for chest pain   *If you need a refill on your cardiac medications before your next appointment, please call your pharmacy*   Lab Work: Your physician recommends that you return for lab work in: Today   If you have labs (blood work) drawn today and your tests are completely normal, you will receive your results only by: [place] (if you have MyChart) OR A paper copy in the mail If you have any lab test that is abnormal or we need to change your treatment, we will call you to review the results.   Testing/Procedures: Your physician has requested that you have an echocardiogram. Echocardiography is a painless test that uses sound waves to create images of your heart. It provides your doctor with information about the size and shape of your heart and how well your hearts chambers and valves are working. This procedure takes approximately one hour. There are no restrictions for this procedure.    Follow-Up: At [facility], you and your health needs are our priority.  As part of our continuing mission to provide you with exceptional heart care, we have created designated Provider Care Teams.  These Care Teams include your primary Cardiologist (physician) and Advanced Practice Providers (APPs -  Physician Assistants and Nurse Practitioners) who all work together to provide you with the care you need, when you need it.  We recommend signing up for the patient portal called "MyChart".  Sign up information is provided on this After Visit Summary.  MyChart is used to connect with patients for Virtual Visits (Telemedicine).  Patients are able to view lab/test results, encounter notes, upcoming appointments, etc.  Non-urgent messages can be sent to your provider as well.   To learn more about what you can do with MyChart, go to [email or web address].     Your next appointment:   2 week(s)  The format for your next appointment:   In Person  Provider:   You may see Rozann Lesches, MD or one of the following Advanced Practice Providers on your designated Care Team:   Bernerd Pho, PA-C  Ermalinda Barrios, [place]     Other Instructions Two Gram Sodium Diet 2000 mg  What is Sodium? Sodium is a mineral found naturally in many foods. The most significant source of sodium in the diet is table salt, which is about 40% sodium.  Processed, convenience, and preserved foods also contain a large amount of sodium.  The body needs only 500 mg of sodium daily to function,  A normal diet provides more than enough sodium even if you do not use salt.  Why Limit Sodium? A build up of sodium in the body can cause thirst, increased blood pressure, shortness of breath, and water retention.  Decreasing sodium in the diet can reduce edema and risk of heart attack or stroke associated with high blood pressure.  Keep in mind that there are many other factors involved in these health problems.  Heredity, obesity, lack of exercise, cigarette smoking, stress and what you eat all play a role.  General Guidelines: Do not add salt at the table or in cooking.  One teaspoon of salt contains over 2 grams of sodium. Read food labels Avoid processed and convenience foods Ask your dietitian before eating any foods not dicussed in the menu planning guidelines Consult your physician if  you wish to use a salt substitute or a sodium containing medication such as antacids.  Limit milk and milk products to 16 oz (2 cups) per day.  Shopping Hints: READ LABELS!! "Dietetic" does not necessarily mean low sodium. Salt and other sodium ingredients are often added to foods during processing.    Menu Planning Guidelines Food Group Choose More Often Avoid  Beverages (see also the milk group All fruit juices, low-sodium, salt-free vegetables juices, low-sodium carbonated beverages  Regular vegetable or tomato juices, commercially softened water used for drinking or cooking  Breads and Cereals Enriched white, wheat, rye and pumpernickel bread, hard rolls and dinner rolls; muffins, cornbread and waffles; most dry cereals, cooked cereal without added salt; unsalted crackers and breadsticks; low sodium or homemade bread crumbs Bread, rolls and crackers with salted tops; quick breads; instant hot cereals; pancakes; commercial bread stuffing; self-rising flower and biscuit mixes; regular bread crumbs or cracker crumbs  Desserts and Sweets Desserts and sweets mad with mild should be within allowance Instant pudding mixes and cake mixes  Fats Butter or margarine; vegetable oils; unsalted salad dressings, regular salad dressings limited to 1 Tbs; light, sour and heavy cream Regular salad dressings containing bacon fat, bacon bits, and salt pork; snack dips made with instant soup mixes or processed cheese; salted nuts  Fruits Most fresh, frozen and canned fruits Fruits processed with salt or sodium-containing ingredient (some dried fruits are processed with sodium sulfites        Vegetables Fresh, frozen vegetables and low- sodium canned vegetables Regular canned vegetables, sauerkraut, pickled vegetables, and others prepared in brine; frozen vegetables in sauces; vegetables seasoned with ham, bacon or salt pork  Condiments, Sauces, Miscellaneous  Salt substitute with physician's approval; pepper, herbs, spices; vinegar, lemon or lime juice; hot pepper sauce; garlic powder, onion powder, low sodium soy sauce (1 Tbs.); low sodium condiments (ketchup, chili sauce, mustard) in limited amounts (1 tsp.) fresh ground horseradish; unsalted tortilla chips, pretzels, potato chips, popcorn, salsa (1/4 cup) Any seasoning made with salt including garlic salt, celery salt, onion salt, and seasoned salt; sea salt, rock salt, kosher salt; meat tenderizers; monosodium glutamate; mustard, regular soy sauce,  barbecue, sauce, chili sauce, teriyaki sauce, steak sauce, Worcestershire sauce, and most flavored vinegars; canned gravy and mixes; regular condiments; salted snack foods, olives, picles, relish, horseradish sauce, catsup   Food preparation: Try these seasonings Meats:    Pork Sage, onion Serve with applesauce  Chicken Poultry seasoning, thyme, parsley Serve with cranberry sauce  Lamb Curry powder, rosemary, garlic, thyme Serve with mint sauce or jelly  Veal Marjoram, basil Serve with current jelly, cranberry sauce  Beef Pepper, bay leaf Serve with dry mustard, unsalted chive butter  Fish [place] leaf, dill Serve with unsalted lemon butter, unsalted parsley butter  Vegetables:    Asparagus Lemon juice   Broccoli Lemon juice   Carrots Mustard dressing parsley, mint, nutmeg, glazed with unsalted butter and sugar   Green beans Marjoram, lemon juice, nutmeg,dill seed   Tomatoes Basil, marjoram, onion   Spice /blend for [employer]" 4 tsp ground thyme 1 tsp ground sage 3 tsp ground rosemary 4 tsp ground marjoram   Test your knowledge A product that says "Salt Free" may still contain sodium. True or False Garlic Powder and Hot Pepper Sauce an be used as alternative seasonings.True or False Processed foods have more sodium than fresh foods.  True or False Canned Vegetables have less sodium than froze True or False   WAYS TO  DECREASE YOUR SODIUM INTAKE Avoid the use of added salt in cooking and at the table.  Table salt (and other prepared seasonings which contain salt) is probably one of the greatest sources of sodium in the diet.  Unsalted foods can gain flavor from the sweet, sour, and butter taste sensations of herbs and spices.  Instead of using salt for seasoning, try the following seasonings with the foods listed.  Remember: how you use them to enhance natural food flavors is limited only by your creativity... Allspice-Meat, fish, eggs, fruit, peas, red and yellow vegetables Almond  Extract-Fruit baked goods Anise Seed-Sweet breads, fruit, carrots, beets, cottage cheese, cookies (tastes like licorice) Basil-Meat, fish, eggs, vegetables, rice, vegetables salads, soups, sauces Bay Leaf-Meat, fish, stews, poultry Burnet-Salad, vegetables (cucumber-like flavor) Caraway Seed-Bread, cookies, cottage cheese, meat, vegetables, cheese, rice Cardamon-Baked goods, fruit, soups Celery Powder or seed-Salads, salad dressings, sauces, meatloaf, soup, bread.Do not use  celery salt Chervil-Meats, salads, fish, eggs, vegetables, cottage cheese (parsley-like flavor) Chili Power-Meatloaf, chicken cheese, corn, eggplant, egg dishes Chives-Salads cottage cheese, egg dishes, soups, vegetables, sauces Cilantro-Salsa, casseroles Cinnamon-Baked goods, fruit, pork, lamb, chicken, carrots Cloves-Fruit, baked goods, fish, pot roast, green beans, beets, carrots Coriander-Pastry, cookies, meat, salads, cheese (lemon-orange flavor) Cumin-Meatloaf, fish,cheese, eggs, cabbage,fruit pie (caraway flavor) [employer], fruit, eggs, fish, poultry, cottage cheese, vegetables Dill Seed-Meat, cottage cheese, poultry, vegetables, fish, salads, bread Fennel Seed-Bread, cookies, apples, pork, eggs, fish, beets, cabbage, cheese, Licorice-like flavor Garlic-(buds or powder) Salads, meat, poultry, fish, bread, butter, vegetables, potatoes.Do not  use garlic salt Ginger-Fruit, vegetables, baked goods, meat, fish, poultry Horseradish Root-Meet, vegetables, butter Lemon Juice or Extract-Vegetables, fruit, tea, baked goods, fish salads Mace-Baked goods fruit, vegetables, fish, poultry (taste like nutmeg) Maple Extract-Syrups Marjoram-Meat, chicken, fish, vegetables, breads, green salads (taste like Sage) Mint-Tea, lamb, sherbet, vegetables, desserts, carrots, cabbage Mustard, Dry or Seed-Cheese, eggs, meats, vegetables, poultry Nutmeg-Baked goods, fruit, chicken, eggs, vegetables, desserts Onion Powder-Meat,  fish, poultry, vegetables, cheese, eggs, bread, rice salads (Do not use   Onion salt) Orange Extract-Desserts, baked goods Oregano-Pasta, eggs, cheese, onions, pork, lamb, fish, chicken, vegetables, green salads Paprika-Meat, fish, poultry, eggs, cheese, vegetables Parsley Flakes-Butter, vegetables, meat fish, poultry, eggs, bread, salads (certain forms may   Contain sodium Pepper-Meat fish, poultry, vegetables, eggs Peppermint Extract-Desserts, baked goods Poppy Seed-Eggs, bread, cheese, fruit dressings, baked goods, noodles, vegetables, cottage  [employer], poultry, meat, fish, cauliflower, turnips,eggs bread Saffron-Rice, bread, veal, chicken, fish, eggs Sage-Meat, fish, poultry, onions, eggplant, tomateos, pork, stews Savory-Eggs, salads, poultry, meat, rice, vegetables, soups, pork Tarragon-Meat, poultry, fish, eggs, butter, vegetables (licorice-like flavor)  Thyme-Meat, poultry, fish, eggs, vegetables, (clover-like flavor), sauces, soups Tumeric-Salads, butter, eggs, fish, rice, vegetables (saffron-like flavor) Vanilla Extract-Baked goods, candy Vinegar-Salads, vegetables, meat marinades Walnut Extract-baked goods, candy   2. Choose your Foods Wisely   The following is a list of foods to avoid which are high in sodium:  Meats-Avoid all smoked, canned, salt cured, dried and kosher meat and fish as well as Anchovies   [employer] meats:Bologna, Liverwurst, Pastrami Canned meat or fish  Marinated herring Caviar    Pepperoni Corned Beef   Pizza Dried chipped beef  Salami Frozen breaded fish or meat Salt pork Frankfurters or hot dogs  Sardines Gefilte fish   Sausage Ham (boiled ham, Proscuitto Smoked butt    spiced ham)   Spam      TV Dinners Vegetables Canned vegetables (Regular) Relish Canned mushrooms  Sauerkraut Olives    Tomato  juice Pickles  Bakery and Dessert Products Canned puddings  Cream  pies Cheesecake   Decorated cakes Cookies  Beverages/Juices Tomato juice, regular  Gatorade   V-8 vegetable juice, regular  Breads and Cereals Biscuit mixes   Salted potato chips, corn chips, pretzels Bread stuffing mixes  Salted crackers and rolls Pancake and waffle mixes Self-rising flour  Seasonings Accent    Meat sauces Barbecue sauce  Meat tenderizer Catsup    Monosodium glutamate (MSG) Celery salt   Onion salt Chili sauce   Prepared mustard Garlic salt   Salt, seasoned salt, sea salt Gravy mixes   Soy sauce Horseradish   Steak sauce Ketchup   Tartar sauce Lite salt    Teriyaki sauce Marinade mixes   Worcestershire sauce  Others Baking powder   Cocoa and cocoa mixes Baking soda   Commercial casserole mixes Candy-caramels, chocolate  Dehydrated soups    Bars, fudge,nougats  Instant rice and pasta mixes Canned broth or soup  Maraschino cherries Cheese, aged and processed cheese and cheese spreads  Learning Assessment Quiz  Indicated T (for True) or F (for False) for each of the following statements:  _____ Fresh fruits and vegetables and unprocessed grains are generally low in sodium _____ Water may contain a considerable amount of sodium, depending on the source _____ You can always tell if a food is high in sodium by tasting it _____ Certain laxatives my be high in sodium and should be avoided unless prescribed   by a physician or pharmacist _____ Salt substitutes may be used freely by anyone on a sodium restricted diet _____ Sodium is present in table salt, food additives and as a natural component of   most foods _____ Table salt is approximately 90% sodium _____ Limiting sodium intake may help prevent excess fluid accumulation in the body _____ On a sodium-restricted diet, seasonings such as bouillon soy sauce, and    cooking wine should be used in place of table salt _____ On an ingredient list, a product which lists monosodium glutamate as the first    ingredient is an appropriate food to include on a low sodium diet  Circle the best answer(s) to the following statements (Hint: there may be more than one correct answer)  11. On a low-sodium diet, some acceptable snack items are:    A. Olives  F. Bean dip   K. Grapefruit juice    B. Salted Pretzels G. Commercial Popcorn   L. Canned peaches    C. Carrot Sticks  H. Bouillon   M. Unsalted nuts   D. [place] fries  I. Peanut butter crackers N. Salami   E. Sweet pickles J. Tomato Juice   O. Pizza  12.  Seasonings that may be used freely on a reduced - sodium diet include   A. Lemon wedges F.Monosodium glutamate K. Celery seed    B.Soysauce   G. Pepper   L. Mustard powder   C. Sea salt  H. Cooking wine  M. Onion flakes   D. Vinegar  E. Prepared horseradish N. Salsa   E. Sage   J. Worcestershire sauce  O. Chutney

## 2021-07-28 NOTE — Progress Notes (Signed)
Cardiology Office Note    Date:  07/28/2021   ID:  Katelyn Kaufman, DOB 06/18/1950, MRN [id number]   PCP:  Leeanne Rio, [place]  Cardiologist:  Rozann Lesches, MD   Advanced Practice Provider:  No care team member to display Electrophysiologist:  None   [phone]   No chief complaint on file.   History of Present Illness:  Katelyn Kaufman is a 71 y.o. female with history of HTN, HLD, DM,  reported history of CAD in [place]. She states that she had 3 separate heart attacks in 2017, 2018, and 2019, receiving a stent intervention with each of these events.   She saw Dr. Domenic Polite 07/08/20 with chest pain and limited with chronic back and leg pain followed in pain clinic. Lexiscan and echo both normal. She was maintained on Plavix, coreg, zetia, imdur losartan and crestor.  In ED [facility] 06/04/21 with chest pain, troponins normal, EKG unchanged and discharged home. Had a URI.  About 2 weeks ago she developed lower ext swelling, has gotten extra salt in her diet-eating out a lot. Has a chest ache if doing work in the house and she has to stop and sit down. She get short of breath with little activity. Patient says she thinks it's just fluid build up. She's gained about 10-20 lbs. She's drinking 2 16 ounces of walter daily. Still smoking 1/2 ppd.   Past Medical History:  Diagnosis Date   Arthritis    CAD (coronary artery disease) 2018   COPD (chronic obstructive pulmonary disease) ([place])    Essential hypertension    Hyperlipidemia    Myocardial infarction (HCC)    OSA (obstructive sleep apnea)    Type 2 diabetes mellitus ([place])     Past Surgical History:  Procedure Laterality Date   BACK SURGERY     BREAST SURGERY     CATARACT EXTRACTION W/PHACO Right 10/24/2020   Procedure: CATARACT EXTRACTION PHACO AND INTRAOCULAR LENS PLACEMENT ([place]);  Surgeon: Baruch Goldmann, MD;  Location: [facility];  Service: Ophthalmology;  Laterality: Right;  CDE:  10.87   CATARACT EXTRACTION W/PHACO Left 12/01/2020   Procedure: CATARACT EXTRACTION PHACO AND INTRAOCULAR LENS PLACEMENT LEFT EYE;  Surgeon: Baruch Goldmann, MD;  Location: [facility];  Service: Ophthalmology;  Laterality: Left;  CDE  8.62   REPLACEMENT TOTAL KNEE Right    TUBAL LIGATION      Current Medications: Current Meds  Medication Sig   Ascorbic Acid (VITAMIN C) 1000 MG tablet Take 1,000 mg by mouth daily.   aspirin EC 81 MG tablet Take 81 mg by mouth daily. Swallow whole.   Calcium Carb-Cholecalciferol (CALCIUM-VITAMIN D3) 600-400 MG-UNIT TABS Take 1 capsule by mouth daily.   carvedilol (COREG) 25 MG tablet Take 25 mg by mouth 2 (two) times daily with a meal.   cholecalciferol (VITAMIN D3) 25 MCG (1000 UNIT) tablet Take 1,000 Units by mouth daily.   clopidogrel (PLAVIX) 75 MG tablet Take 1 tablet (75 mg total) by mouth daily.   cyclobenzaprine (FLEXERIL) 10 MG tablet Take 10 mg by mouth in the morning and at bedtime.   DULoxetine (CYMBALTA) 60 MG capsule Take 60 mg by mouth 2 (two) times daily.   ezetimibe (ZETIA) 10 MG tablet Take 10 mg by mouth daily.   furosemide (LASIX) 40 MG tablet Take 1.5 tablets (60 mg total) by mouth daily.   glimepiride (AMARYL) 4 MG tablet Take 4 mg by mouth in the morning and  at bedtime.   insulin glargine (LANTUS) 100 UNIT/ML injection Inject 40 Units into the skin at bedtime.   isosorbide mononitrate (IMDUR) 60 MG 24 hr tablet Take 1 tablet (60 mg total) by mouth daily.   losartan (COZAAR) 50 MG tablet Take 50 mg by mouth daily.   metFORMIN (GLUCOPHAGE) 500 MG tablet Take 500 mg by mouth 2 (two) times daily with a meal.   nitroGLYCERIN (NITROSTAT) 0.4 MG SL tablet Place 1 tablet (0.4 mg total) under the tongue every 5 (five) minutes as needed for chest pain.   Omega-3 Fatty Acids (DIALYVITE OMEGA-3 CONCENTRATE) 600 MG CAPS Take 600 mg by mouth daily.   oxyCODONE-acetaminophen (PERCOCET) 7.5-325 MG tablet Take 1.5 tablets by mouth in the morning, at noon,  in the evening, and at bedtime. 7am, 12pm, 5pm, 10pm   rosuvastatin (CRESTOR) 10 MG tablet Take 10 mg by mouth daily.   vitamin B-12 (CYANOCOBALAMIN) 1000 MCG tablet Take 1,000 mcg by mouth daily.   [DISCONTINUED] furosemide (LASIX) 40 MG tablet Take 40 mg by mouth daily.   [DISCONTINUED] isosorbide mononitrate (IMDUR) 30 MG 24 hr tablet Take 30 mg by mouth daily.     Allergies:   Latex   Social History   Socioeconomic History   Marital status: Widowed    Spouse name: Not on file   Number of children: Not on file   Years of education: Not on file   Highest education level: Not on file  Occupational History   Not on file  Tobacco Use   Smoking status: Every Day    Packs/day: 0.50    Years: 40.00    Pack years: 20.00    Types: Cigarettes   Smokeless tobacco: Never  Vaping Use   Vaping Use: Never used  Substance and Sexual Activity   Alcohol use: Never   Drug use: Never   Sexual activity: Not Currently  Other Topics Concern   Not on file  Social History Narrative   Not on file   Social Determinants of Health   Financial Resource Strain: Not on file  Food Insecurity: Not on file  Transportation Needs: Not on file  Physical Activity: Not on file  Stress: Not on file  Social Connections: Not on file     Family History:  The patient's  family history includes Alcohol abuse in her father; COPD in her mother; Heart attack in her brother; Hypertension in her sister.   ROS:   Please see the history of present illness.    ROS All other systems reviewed and are negative.   PHYSICAL EXAM:   VS:  BP 130/82    Pulse 70    Ht '5\' 3"'$  (1.6 m)    Wt 262 lb (118.8 kg)    SpO2 100%    BMI 46.41 kg/m   Physical Exam  GEN: Well nourished, well developed, in no acute distress  Neck: no JVD, carotid bruits, or masses Cardiac:RRR; no murmurs, rubs, or gallops  Respiratory:  clear to auscultation bilaterally, normal work of breathing GI: soft, nontender, nondistended, + BS Ext: mild  bilateral edema, without cyanosis, clubbing,  Good distal pulses bilaterally Neuro:  Alert and Oriented x 3, Psych: euthymic mood, full affect  Wt Readings from Last 3 Encounters:  07/28/21 262 lb (118.8 kg)  11/25/20 243 lb 6.2 oz (110.4 kg)  07/08/20 244 lb (110.7 kg)      Studies/Labs Reviewed:   EKG:  EKG is  ordered today.  The ekg ordered today demonstrates  NSR with new subtle TWI inflat  Recent Labs: No results found for requested labs within last 8760 hours.   Lipid Panel No results found for: CHOL, TRIG, HDL, CHOLHDL, VLDL, LDLCALC, LDLDIRECT  Additional studies/ records that were reviewed today include:  Echo 08/14/20  IMPRESSIONS     1. Left ventricular ejection fraction, by estimation, is 55 to 60%. The  left ventricle has normal function. The left ventricle has no regional  wall motion abnormalities. There is mild left ventricular hypertrophy.  Left ventricular diastolic parameters  are indeterminate.   2. Right ventricular systolic function is normal. The right ventricular  size is normal.   3. Trivial mitral valve regurgitation.   4. The inferior vena cava is normal in size with greater than 50%  respiratory variability, suggesting right atrial pressure of 3 mmHg.   FINDINGS   Left Ventricle: Left ventricular ejection fraction, by estimation, is 55  to 60%. The left ventricle has normal function. The left ventricle has no  regional wall motion abnormalities. The left ventricular internal cavity  size was normal in size. There is   mild left ventricular hypertrophy. Left ventricular diastolic parameters  are indeterminate.   Lexi 08/14/20 Lexiscan stress is electrically negative for ischemia Myovue scan shows probable normal perfusion and mild soft tissue attenuation (breast) No significant ischemia or scar. LVEF calculated at 51% with normal wall motion Ovreall low risk scan   Risk Assessment/Calculations:         ASSESSMENT:    1. Coronary  artery disease involving native coronary artery of native heart without angina pectoris   2. Acute on chronic diastolic congestive heart failure ([place])   3. Essential hypertension   4. Mixed hyperlipidemia      PLAN:  In order of problems listed above: Reported history of CAD in [place]. She states that she had 3 separate heart attacks in 2017, 2018, and 2019, receiving a stent intervention with each of these events. NST 07/2020 low risk, echo normal LVEF. In ED [facility] 06/04/21 with chest pain, troponins normal, EKG unchanged and discharged home. Had a URI. -Now with exertional chest pain and fluid overload. Also subtle TWI. Have discussed with Dr. Domenic Polite. Patient reluctant to have cath at this time. Will increase lasix 80 mg for 3 days then 60 mg daily, 2 Decho to reassess LV, increase Imdur 60 mg daily, NTG SL, ER if prolonged pain. Bring back in 2 weeks and if still symptoms will need cath.  Acute on chronic diastolic CHF-getting extra salt in diet-2 gm sodium, increase lasix as above, repeat echo. Only mild edema on exam.  HTN BP controlled  HLD on zetia, crestor  DM per PCP  Tobacco abuse-smoking cessation discussed.   Shared Decision Making/Informed Consent        Medication Adjustments/Labs and Tests Ordered: Current medicines are reviewed at length with the patient today.  Concerns regarding medicines are outlined above.  Medication changes, Labs and Tests ordered today are listed in the Patient Instructions below. Patient Instructions  Medication Instructions:   Lasix 80 mg for 3 day then reduce to 60 mg Daily  Increase Imdur to 60 mg Daily  You have been given Nitro for chest pain   *If you need a refill on your cardiac medications before your next appointment, please call your pharmacy*   Lab Work: Your physician recommends that you return for lab work in: Today   If you have labs (blood work) drawn today and your tests are completely  normal, you will receive  your results only by: MyChart Message (if you have MyChart) OR A paper copy in the mail If you have any lab test that is abnormal or we need to change your treatment, we will call you to review the results.   Testing/Procedures: Your physician has requested that you have an echocardiogram. Echocardiography is a painless test that uses sound waves to create images of your heart. It provides your doctor with information about the size and shape of your heart and how well your hearts chambers and valves are working. This procedure takes approximately one hour. There are no restrictions for this procedure.    Follow-Up: At [facility], you and your health needs are our priority.  As part of our continuing mission to provide you with exceptional heart care, we have created designated Provider Care Teams.  These Care Teams include your primary Cardiologist (physician) and Advanced Practice Providers (APPs -  Physician Assistants and Nurse Practitioners) who all work together to provide you with the care you need, when you need it.  We recommend signing up for the patient portal called "MyChart".  Sign up information is provided on this After Visit Summary.  MyChart is used to connect with patients for Virtual Visits (Telemedicine).  Patients are able to view lab/test results, encounter notes, upcoming appointments, etc.  Non-urgent messages can be sent to your provider as well.   To learn more about what you can do with MyChart, go to [email or web address].    Your next appointment:   2 week(s)  The format for your next appointment:   In Person  Provider:   You may see Rozann Lesches, MD or one of the following Advanced Practice Providers on your designated Care Team:   Bernerd Pho, PA-C  Ermalinda Barrios, [place]     Other Instructions Two Gram Sodium Diet 2000 mg  What is Sodium? Sodium is a mineral found naturally in many foods. The most significant source of sodium in the diet is  table salt, which is about 40% sodium.  Processed, convenience, and preserved foods also contain a large amount of sodium.  The body needs only 500 mg of sodium daily to function,  A normal diet provides more than enough sodium even if you do not use salt.  Why Limit Sodium? A build up of sodium in the body can cause thirst, increased blood pressure, shortness of breath, and water retention.  Decreasing sodium in the diet can reduce edema and risk of heart attack or stroke associated with high blood pressure.  Keep in mind that there are many other factors involved in these health problems.  Heredity, obesity, lack of exercise, cigarette smoking, stress and what you eat all play a role.  General Guidelines: Do not add salt at the table or in cooking.  One teaspoon of salt contains over 2 grams of sodium. Read food labels Avoid processed and convenience foods Ask your dietitian before eating any foods not dicussed in the menu planning guidelines Consult your physician if you wish to use a salt substitute or a sodium containing medication such as antacids.  Limit milk and milk products to 16 oz (2 cups) per day.  Shopping Hints: READ LABELS!! "Dietetic" does not necessarily mean low sodium. Salt and other sodium ingredients are often added to foods during processing.    Menu Planning Guidelines Food Group Choose More Often Avoid  Beverages (see also the milk group All fruit juices, low-sodium, salt-free vegetables juices, low-sodium carbonated  beverages Regular vegetable or tomato juices, commercially softened water used for drinking or cooking  Breads and Cereals Enriched white, wheat, rye and pumpernickel bread, hard rolls and dinner rolls; muffins, cornbread and waffles; most dry cereals, cooked cereal without added salt; unsalted crackers and breadsticks; low sodium or homemade bread crumbs Bread, rolls and crackers with salted tops; quick breads; instant hot cereals; pancakes; commercial bread  stuffing; self-rising flower and biscuit mixes; regular bread crumbs or cracker crumbs  Desserts and Sweets Desserts and sweets mad with mild should be within allowance Instant pudding mixes and cake mixes  Fats Butter or margarine; vegetable oils; unsalted salad dressings, regular salad dressings limited to 1 Tbs; light, sour and heavy cream Regular salad dressings containing bacon fat, bacon bits, and salt pork; snack dips made with instant soup mixes or processed cheese; salted nuts  Fruits Most fresh, frozen and canned fruits Fruits processed with salt or sodium-containing ingredient (some dried fruits are processed with sodium sulfites        Vegetables Fresh, frozen vegetables and low- sodium canned vegetables Regular canned vegetables, sauerkraut, pickled vegetables, and others prepared in brine; frozen vegetables in sauces; vegetables seasoned with ham, bacon or salt pork  Condiments, Sauces, Miscellaneous  Salt substitute with physician's approval; pepper, herbs, spices; vinegar, lemon or lime juice; hot pepper sauce; garlic powder, onion powder, low sodium soy sauce (1 Tbs.); low sodium condiments (ketchup, chili sauce, mustard) in limited amounts (1 tsp.) fresh ground horseradish; unsalted tortilla chips, pretzels, potato chips, popcorn, salsa (1/4 cup) Any seasoning made with salt including garlic salt, celery salt, onion salt, and seasoned salt; sea salt, rock salt, kosher salt; meat tenderizers; monosodium glutamate; mustard, regular soy sauce, barbecue, sauce, chili sauce, teriyaki sauce, steak sauce, Worcestershire sauce, and most flavored vinegars; canned gravy and mixes; regular condiments; salted snack foods, olives, picles, relish, horseradish sauce, catsup   Food preparation: Try these seasonings Meats:    Pork Sage, onion Serve with applesauce  Chicken Poultry seasoning, thyme, parsley Serve with cranberry sauce  Lamb Curry powder, rosemary, garlic, thyme Serve with mint sauce  or jelly  Veal Marjoram, basil Serve with current jelly, cranberry sauce  Beef Pepper, bay leaf Serve with dry mustard, unsalted chive butter  Fish [place] leaf, dill Serve with unsalted lemon butter, unsalted parsley butter  Vegetables:    Asparagus Lemon juice   Broccoli Lemon juice   Carrots Mustard dressing parsley, mint, nutmeg, glazed with unsalted butter and sugar   Green beans Marjoram, lemon juice, nutmeg,dill seed   Tomatoes Basil, marjoram, onion   Spice /blend for [employer]" 4 tsp ground thyme 1 tsp ground sage 3 tsp ground rosemary 4 tsp ground marjoram   Test your knowledge A product that says "Salt Free" may still contain sodium. True or False Garlic Powder and Hot Pepper Sauce an be used as alternative seasonings.True or False Processed foods have more sodium than fresh foods.  True or False Canned Vegetables have less sodium than froze True or False   WAYS TO DECREASE YOUR SODIUM INTAKE Avoid the use of added salt in cooking and at the table.  Table salt (and other prepared seasonings which contain salt) is probably one of the greatest sources of sodium in the diet.  Unsalted foods can gain flavor from the sweet, sour, and butter taste sensations of herbs and spices.  Instead of using salt for seasoning, try the following seasonings with the foods listed.  Remember: how you use them to enhance natural  food flavors is limited only by your creativity... Allspice-Meat, fish, eggs, fruit, peas, red and yellow vegetables Almond Extract-Fruit baked goods Anise Seed-Sweet breads, fruit, carrots, beets, cottage cheese, cookies (tastes like licorice) Basil-Meat, fish, eggs, vegetables, rice, vegetables salads, soups, sauces Bay Leaf-Meat, fish, stews, poultry Burnet-Salad, vegetables (cucumber-like flavor) Caraway Seed-Bread, cookies, cottage cheese, meat, vegetables, cheese, rice Cardamon-Baked goods, fruit, soups Celery Powder or seed-Salads, salad dressings, sauces, meatloaf,  soup, bread.Do not use  celery salt Chervil-Meats, salads, fish, eggs, vegetables, cottage cheese (parsley-like flavor) Chili Power-Meatloaf, chicken cheese, corn, eggplant, egg dishes Chives-Salads cottage cheese, egg dishes, soups, vegetables, sauces Cilantro-Salsa, casseroles Cinnamon-Baked goods, fruit, pork, lamb, chicken, carrots Cloves-Fruit, baked goods, fish, pot roast, green beans, beets, carrots Coriander-Pastry, cookies, meat, salads, cheese (lemon-orange flavor) Cumin-Meatloaf, fish,cheese, eggs, cabbage,fruit pie (caraway flavor) [employer], fruit, eggs, fish, poultry, cottage cheese, vegetables Dill Seed-Meat, cottage cheese, poultry, vegetables, fish, salads, bread Fennel Seed-Bread, cookies, apples, pork, eggs, fish, beets, cabbage, cheese, Licorice-like flavor Garlic-(buds or powder) Salads, meat, poultry, fish, bread, butter, vegetables, potatoes.Do not  use garlic salt Ginger-Fruit, vegetables, baked goods, meat, fish, poultry Horseradish Root-Meet, vegetables, butter Lemon Juice or Extract-Vegetables, fruit, tea, baked goods, fish salads Mace-Baked goods fruit, vegetables, fish, poultry (taste like nutmeg) Maple Extract-Syrups Marjoram-Meat, chicken, fish, vegetables, breads, green salads (taste like Sage) Mint-Tea, lamb, sherbet, vegetables, desserts, carrots, cabbage Mustard, Dry or Seed-Cheese, eggs, meats, vegetables, poultry Nutmeg-Baked goods, fruit, chicken, eggs, vegetables, desserts Onion Powder-Meat, fish, poultry, vegetables, cheese, eggs, bread, rice salads (Do not use   Onion salt) Orange Extract-Desserts, baked goods Oregano-Pasta, eggs, cheese, onions, pork, lamb, fish, chicken, vegetables, green salads Paprika-Meat, fish, poultry, eggs, cheese, vegetables Parsley Flakes-Butter, vegetables, meat fish, poultry, eggs, bread, salads (certain forms may   Contain sodium Pepper-Meat fish, poultry, vegetables, eggs Peppermint Extract-Desserts, baked  goods Poppy Seed-Eggs, bread, cheese, fruit dressings, baked goods, noodles, vegetables, cottage  [employer], poultry, meat, fish, cauliflower, turnips,eggs bread Saffron-Rice, bread, veal, chicken, fish, eggs Sage-Meat, fish, poultry, onions, eggplant, tomateos, pork, stews Savory-Eggs, salads, poultry, meat, rice, vegetables, soups, pork Tarragon-Meat, poultry, fish, eggs, butter, vegetables (licorice-like flavor)  Thyme-Meat, poultry, fish, eggs, vegetables, (clover-like flavor), sauces, soups Tumeric-Salads, butter, eggs, fish, rice, vegetables (saffron-like flavor) Vanilla Extract-Baked goods, candy Vinegar-Salads, vegetables, meat marinades Walnut Extract-baked goods, candy   2. Choose your Foods Wisely   The following is a list of foods to avoid which are high in sodium:  Meats-Avoid all smoked, canned, salt cured, dried and kosher meat and fish as well as Anchovies   [employer] meats:Bologna, Liverwurst, Pastrami Canned meat or fish  Marinated herring Caviar    Pepperoni Corned Beef   Pizza Dried chipped beef  Salami Frozen breaded fish or meat Salt pork Frankfurters or hot dogs  Sardines Gefilte fish   Sausage Ham (boiled ham, Proscuitto Smoked butt    spiced ham)   Spam      TV Dinners Vegetables Canned vegetables (Regular) Relish Canned mushrooms  Sauerkraut Olives    Tomato juice Pickles  Bakery and Dessert Products Canned puddings  Cream pies Cheesecake   Decorated cakes Cookies  Beverages/Juices Tomato juice, regular  Gatorade   V-8 vegetable juice, regular  Breads and Cereals Biscuit mixes   Salted potato chips, corn chips, pretzels Bread stuffing mixes  Salted crackers and rolls Pancake and waffle mixes Self-rising flour  Seasonings Accent    Meat sauces Barbecue sauce  Meat tenderizer Catsup    Monosodium glutamate (MSG) Celery  salt   Onion salt Chili sauce   Prepared mustard Garlic  salt   Salt, seasoned salt, sea salt Gravy mixes   Soy sauce Horseradish   Steak sauce Ketchup   Tartar sauce Lite salt    Teriyaki sauce Marinade mixes   Worcestershire sauce  Others Baking powder   Cocoa and cocoa mixes Baking soda   Commercial casserole mixes Candy-caramels, chocolate  Dehydrated soups    Bars, fudge,nougats  Instant rice and pasta mixes Canned broth or soup  Maraschino cherries Cheese, aged and processed cheese and cheese spreads  Learning Assessment Quiz  Indicated T (for True) or F (for False) for each of the following statements:  _____ Fresh fruits and vegetables and unprocessed grains are generally low in sodium _____ Water may contain a considerable amount of sodium, depending on the source _____ You can always tell if a food is high in sodium by tasting it _____ Certain laxatives my be high in sodium and should be avoided unless prescribed   by a physician or pharmacist _____ Salt substitutes may be used freely by anyone on a sodium restricted diet _____ Sodium is present in table salt, food additives and as a natural component of   most foods _____ Table salt is approximately 90% sodium _____ Limiting sodium intake may help prevent excess fluid accumulation in the body _____ On a sodium-restricted diet, seasonings such as bouillon soy sauce, and    cooking wine should be used in place of table salt _____ On an ingredient list, a product which lists monosodium glutamate as the first   ingredient is an appropriate food to include on a low sodium diet  Circle the best answer(s) to the following statements (Hint: there may be more than one correct answer)  11. On a low-sodium diet, some acceptable snack items are:    A. Olives  F. Bean dip   K. Grapefruit juice    B. Salted Pretzels G. Commercial Popcorn   L. Canned peaches    C. Carrot Sticks  H. Bouillon   M. Unsalted nuts   D. [place] fries  I. Peanut butter crackers N. Salami   E. Sweet pickles J.  Tomato Juice   O. Pizza  12.  Seasonings that may be used freely on a reduced - sodium diet include   A. Lemon wedges F.Monosodium glutamate K. Celery seed    B.Soysauce   G. Pepper   L. Mustard powder   C. Sea salt  H. Cooking wine  M. Onion flakes   D. Vinegar  E. Prepared horseradish N. Salsa   E. Sage   J. Worcestershire sauce  O. Chutney     Signed, Ermalinda Barrios, PA-C  07/28/2021 1:46 PM    [facility] [address], [place], [place]  [id number] Phone: [phone]; Fax: [phone]

## 2021-07-30 NOTE — Telephone Encounter (Signed)
Pt seen in office 07/28/21 ?

## 2021-08-06 ENCOUNTER — Other Ambulatory Visit: Payer: Self-pay | Admitting: Cardiology

## 2021-08-11 ENCOUNTER — Encounter: Payer: Self-pay | Admitting: Cardiology

## 2021-08-11 ENCOUNTER — Other Ambulatory Visit: Payer: Self-pay

## 2021-08-11 ENCOUNTER — Ambulatory Visit: Payer: Medicare HMO | Admitting: Cardiology

## 2021-08-11 VITALS — BP 134/82 | HR 61 | Ht 64.0 in | Wt 252.2 lb

## 2021-08-11 DIAGNOSIS — I5033 Acute on chronic diastolic (congestive) heart failure: Secondary | ICD-10-CM | POA: Diagnosis not present

## 2021-08-11 DIAGNOSIS — I25119 Atherosclerotic heart disease of native coronary artery with unspecified angina pectoris: Secondary | ICD-10-CM | POA: Diagnosis not present

## 2021-08-11 NOTE — Progress Notes (Signed)
? ? ?Cardiology Office Note ? ?Date: 08/11/2021  ? ?ID: Katelyn Kaufman, DOB 02/16/1951, MRN [id number] ? ?PCP:  Leeanne Rio, MD  ?Cardiologist:  Rozann Lesches, MD ?Electrophysiologist:  None  ? ?Chief Complaint  ?Patient presents with  ? Cardiac follow-up  ? ? ?History of Present Illness: ?Katelyn Kaufman is a 71 y.o. female last seen on March 7 by Ms. Vita Barley.  She had evidence of fluid overload at that time and also experiencing chest discomfort.  Medications were adjusted and she presents for clinical reevaluation.  She is here today with her sister.  She states that she has been feeling somewhat better, still short of breath and fatigued, but her leg swelling and abdominal distention has improved, not back to baseline.  She has lost about 10 pounds. ? ?She was seen by her PCP yesterday, weight recorded at 254 pounds and sent for lab work which is still pending.  We are calling to see if we can get the results. ? ?I reviewed her medications, she has continued on Lasix at 60 mg daily since initially doubling the dose.  Follow-up echocardiogram is still pending. ? ?Past Medical History:  ?Diagnosis Date  ? Arthritis   ? CAD (coronary artery disease) 2018  ? COPD (chronic obstructive pulmonary disease) ([place])   ? Essential hypertension   ? Hyperlipidemia   ? Myocardial infarction [facility])   ? OSA (obstructive sleep apnea)   ? Type 2 diabetes mellitus ([place])   ? ? ?Past Surgical History:  ?Procedure Laterality Date  ? BACK SURGERY    ? BREAST SURGERY    ? CATARACT EXTRACTION W/PHACO Right 10/24/2020  ? Procedure: CATARACT EXTRACTION PHACO AND INTRAOCULAR LENS PLACEMENT (IOC);  Surgeon: Baruch Goldmann, MD;  Location: [facility];  Service: Ophthalmology;  Laterality: Right;  CDE: 10.87  ? CATARACT EXTRACTION W/PHACO Left 12/01/2020  ? Procedure: CATARACT EXTRACTION PHACO AND INTRAOCULAR LENS PLACEMENT LEFT EYE;  Surgeon: Baruch Goldmann, MD;  Location: [facility];  Service: Ophthalmology;  Laterality: Left;  CDE  8.62  ?  REPLACEMENT TOTAL KNEE Right   ? TUBAL LIGATION    ? ? ?Current Outpatient Medications  ?Medication Sig Dispense Refill  ? Ascorbic Acid (VITAMIN C) 1000 MG tablet Take 1,000 mg by mouth daily.    ? aspirin EC 81 MG tablet Take 81 mg by mouth daily. Swallow whole.    ? Calcium Carb-Cholecalciferol (CALCIUM-VITAMIN D3) 600-400 MG-UNIT TABS Take 1 capsule by mouth daily.    ? carvedilol (COREG) 25 MG tablet Take 25 mg by mouth 2 (two) times daily with a meal.    ? cholecalciferol (VITAMIN D3) 25 MCG (1000 UNIT) tablet Take 1,000 Units by mouth daily.    ? clopidogrel (PLAVIX) 75 MG tablet TAKE 1 TABLET BY MOUTH EVERY DAY 90 tablet 3  ? cyclobenzaprine (FLEXERIL) 10 MG tablet Take 10 mg by mouth in the morning and at bedtime.    ? DULoxetine (CYMBALTA) 60 MG capsule Take 60 mg by mouth 2 (two) times daily.    ? ezetimibe (ZETIA) 10 MG tablet Take 10 mg by mouth daily.    ? furosemide (LASIX) 40 MG tablet Take 1.5 tablets (60 mg total) by mouth daily. 138 tablet 3  ? glimepiride (AMARYL) 4 MG tablet Take 4 mg by mouth in the morning and at bedtime.    ? insulin glargine (LANTUS) 100 UNIT/ML injection Inject 40 Units into the skin at bedtime.    ? isosorbide mononitrate (IMDUR) 60 MG  24 hr tablet Take 1 tablet (60 mg total) by mouth daily. 90 tablet 3  ? losartan (COZAAR) 50 MG tablet Take 50 mg by mouth daily.    ? metFORMIN (GLUCOPHAGE) 500 MG tablet Take 500 mg by mouth 2 (two) times daily with a meal.    ? nitroGLYCERIN (NITROSTAT) 0.4 MG SL tablet Place 1 tablet (0.4 mg total) under the tongue every 5 (five) minutes as needed for chest pain. 30 tablet 3  ? Omega-3 Fatty Acids (DIALYVITE OMEGA-3 CONCENTRATE) 600 MG CAPS Take 600 mg by mouth daily.    ? oxyCODONE-acetaminophen (PERCOCET) 7.5-325 MG tablet Take 1.5 tablets by mouth in the morning, at noon, in the evening, and at bedtime. 7am, 12pm, 5pm, 10pm    ? rosuvastatin (CRESTOR) 10 MG tablet Take 10 mg by mouth daily.    ? triamcinolone ointment (KENALOG) 0.1 %  Apply topically.    ? vitamin B-12 (CYANOCOBALAMIN) 1000 MCG tablet Take 1,000 mcg by mouth daily.    ? ?No current facility-administered medications for this visit.  ? ?Allergies:  Latex  ? ?ROS: No palpitations or syncope. ? ?Physical Exam: ?VS:  BP 134/82   Pulse 61   Ht '5\' 4"'$  (1.626 m)   Wt 252 lb 3.2 oz (114.4 kg)   SpO2 98%   BMI 43.29 kg/m? , BMI Body mass index is 43.29 kg/m?. ? ?Wt Readings from Last 3 Encounters:  ?08/11/21 252 lb 3.2 oz (114.4 kg)  ?07/28/21 262 lb (118.8 kg)  ?11/25/20 243 lb 6.2 oz (110.4 kg)  ?  ?General: Patient appears comfortable at rest. ?HEENT: Conjunctiva and lids normal, wearing a mask. ?Neck: Supple, no elevated JVP. ?Lungs: Clear to auscultation, nonlabored breathing at rest. ?Cardiac: Regular rate and rhythm, no S3 or significant systolic murmur, no pericardial rub. ?Abdomen: Soft, nontender, bowel sounds present. ?Extremities: No pitting edema. ? ?ECG:  An ECG dated 07/28/2021 was personally reviewed today and demonstrated:  Sinus rhythm with decreased R wave progression and nonspecific ST-T changes. ? ?Recent Labwork: ?January 2023: High-sensitivity troponin I 61 and 64, pro-BNP 251, hemoglobin 13.2, platelets 201, potassium 4.4, BUN 13, creatinine 1.05, AST 17, ALT 21 ?07/28/2021: B Natriuretic Peptide 86.0; BUN 15; Creatinine, Ser 1.09; Potassium 4.4; Sodium 138  ? ?Other Studies Reviewed Today: ? ?Lexiscan Myoview 08/14/2020: ?Lexiscan stress is electrically negative for ischemia ?Myovue scan shows probable normal perfusion and mild soft tissue attenuation (breast) No significant ischemia or scar. ?LVEF calculated at 51% with normal wall motion ?Ovreall low risk scan ? ?Echocardiogram 08/14/2020: ? 1. Left ventricular ejection fraction, by estimation, is 55 to 60%. The  ?left ventricle has normal function. The left ventricle has no regional  ?wall motion abnormalities. There is mild left ventricular hypertrophy.  ?Left ventricular diastolic parameters  ?are indeterminate.   ? 2. Right ventricular systolic function is normal. The right ventricular  ?size is normal.  ? 3. Trivial mitral valve regurgitation.  ? 4. The inferior vena cava is normal in size with greater than 50%  ?respiratory variability, suggesting right atrial pressure of 3 mmHg.  ? ?Assessment and Plan: ? ?1.  Recent fluid overload, presumably HFpEF with acute on chronic exacerbation although follow-up echocardiogram is pending to reevaluate LVEF.  Prior study from March 2022 revealed LVEF 55 to 60%.  Would continue current medications for now, requesting recent lab work from PCP as we may want to further advance her standing Lasix dose.  Last creatinine was 1.09 on March 7.  We will bring her  back for clinical reevaluation as well.  Otherwise continue Coreg and Cozaar. ? ?2.  Reported history of CAD with previous work-up in [place], details not clear.  Lexiscan Myoview from March 2022 showed no ischemic territories or scar.  Continue aspirin, Plavix, Coreg, Crestor, and Imdur. ? ?3.  Type 2 diabetes mellitus.  She is followed by Dr. Huel Cote and at this point is on Glucophage, Lantus, and Amaryl.  Could consider simplifying regimen to include SGLT2 inhibitor if possible. ? ?4.  Essential hypertension, systolic in the 939Q today, no change in current antihypertensive regimen as yet. ? ?Medication Adjustments/Labs and Tests Ordered: ?Current medicines are reviewed at length with the patient today.  Concerns regarding medicines are outlined above.  ? ?Tests Ordered: ?No orders of the defined types were placed in this encounter. ? ? ?Medication Changes: ?No orders of the defined types were placed in this encounter. ? ? ?Disposition:  Follow up  2 to 3 weeks. ? ?Signed, ?Satira Sark, MD, [facility] ?08/11/2021 2:00 PM    ?[facility] ?[address] [place], [place] [address] ?Phone: [phone]; Fax: [phone]  ?

## 2021-08-11 NOTE — Patient Instructions (Addendum)
Medication Instructions:  ?No Changes  ? ?Testing/Procedures: ?Your physician has requested that you have an echocardiogram. Echocardiography is a painless test that uses sound waves to create images of your heart. It provides your doctor with information about the size and shape of your heart and how well your heart?s chambers and valves are working. This procedure takes approximately one hour. There are no restrictions for this procedure. ? ? ?Follow-Up: ?Follow up with Dr Domenic Polite or APP in 2-3 weeks. ? ?Any Other Special Instructions Will Be Listed Below (If Applicable). ? ?We have requested your lab work from you PCP's office.  ? ? ?If you need a refill on your cardiac medications before your next appointment, please call your pharmacy. ? ? ?

## 2021-08-12 ENCOUNTER — Ambulatory Visit (HOSPITAL_COMMUNITY)
Admission: RE | Admit: 2021-08-12 | Discharge: 2021-08-12 | Disposition: A | Discharge status: Home / Self Care | Payer: Medicare HMO | Source: Ambulatory Visit | Attending: Physician Assistant | Admitting: Physician Assistant

## 2021-08-12 DIAGNOSIS — I5033 Acute on chronic diastolic (congestive) heart failure: Secondary | ICD-10-CM

## 2021-08-12 LAB — ECHOCARDIOGRAM COMPLETE
Area-P 1/2: 2.24 cm2
S' Lateral: 2.7 cm

## 2021-08-12 NOTE — Progress Notes (Signed)
*  PRELIMINARY RESULTS* ?Echocardiogram ?2D Echocardiogram has been performed. ? ?Katelyn Kaufman ?08/12/2021, 9:15 AM ?

## 2021-08-20 ENCOUNTER — Other Ambulatory Visit (HOSPITAL_COMMUNITY): Payer: Medicare HMO

## 2021-09-21 ENCOUNTER — Other Ambulatory Visit: Payer: Self-pay | Admitting: Neurology

## 2021-09-21 ENCOUNTER — Other Ambulatory Visit (HOSPITAL_COMMUNITY): Payer: Self-pay | Admitting: Neurology

## 2021-09-21 DIAGNOSIS — M5416 Radiculopathy, lumbar region: Secondary | ICD-10-CM

## 2021-10-05 ENCOUNTER — Other Ambulatory Visit (HOSPITAL_COMMUNITY): Payer: Self-pay | Admitting: Family Medicine

## 2021-10-05 DIAGNOSIS — Z1231 Encounter for screening mammogram for malignant neoplasm of breast: Secondary | ICD-10-CM

## 2021-10-14 ENCOUNTER — Ambulatory Visit (HOSPITAL_COMMUNITY)
Admission: RE | Admit: 2021-10-14 | Discharge: 2021-10-14 | Disposition: A | Discharge status: Home / Self Care | Payer: Medicare HMO | Source: Ambulatory Visit | Attending: Family Medicine | Admitting: Family Medicine

## 2021-10-14 ENCOUNTER — Ambulatory Visit (HOSPITAL_COMMUNITY)
Admission: RE | Admit: 2021-10-14 | Discharge: 2021-10-14 | Disposition: A | Discharge status: Home / Self Care | Payer: Medicare HMO | Source: Ambulatory Visit | Attending: Neurology | Admitting: Neurology

## 2021-10-14 DIAGNOSIS — M5416 Radiculopathy, lumbar region: Secondary | ICD-10-CM | POA: Diagnosis present

## 2021-10-14 DIAGNOSIS — Z1231 Encounter for screening mammogram for malignant neoplasm of breast: Secondary | ICD-10-CM | POA: Insufficient documentation

## 2021-10-16 ENCOUNTER — Other Ambulatory Visit (HOSPITAL_COMMUNITY): Payer: Self-pay | Admitting: Family Medicine

## 2021-10-22 ENCOUNTER — Other Ambulatory Visit (HOSPITAL_COMMUNITY): Payer: Self-pay | Admitting: Family Medicine

## 2021-10-22 DIAGNOSIS — R928 Other abnormal and inconclusive findings on diagnostic imaging of breast: Secondary | ICD-10-CM

## 2021-10-26 ENCOUNTER — Other Ambulatory Visit: Payer: Self-pay | Admitting: Cardiology

## 2021-10-26 ENCOUNTER — Telehealth: Payer: Self-pay | Admitting: Cardiology

## 2021-10-26 ENCOUNTER — Other Ambulatory Visit: Payer: Self-pay | Admitting: Neurology

## 2021-10-26 DIAGNOSIS — M5416 Radiculopathy, lumbar region: Secondary | ICD-10-CM

## 2021-10-26 NOTE — Telephone Encounter (Signed)
   Pre-operative Risk Assessment    Patient Name: Katelyn Kaufman  DOB: 1950/05/30 MRN: [id number]      Request for Surgical Clearance    Procedure:   Lumbar Epidural   Date of Surgery:  Clearance TBD                                 Surgeon:  not indicated  Surgeon's Group or Practice Name:  [facility] Phone number:  [phone] Fax number:  [phone]   Type of Clearance Requested:   Pharmacy asking to stop Plavix for 5 days    Type of Anesthesia:  Not Indicated   Additional requests/questions:    Sandrea Hammond   10/26/2021, 4:55 PM

## 2021-10-27 ENCOUNTER — Ambulatory Visit (HOSPITAL_COMMUNITY)
Admission: RE | Admit: 2021-10-27 | Discharge: 2021-10-27 | Disposition: A | Discharge status: Home / Self Care | Payer: Medicare HMO | Source: Ambulatory Visit | Attending: Family Medicine | Admitting: Family Medicine

## 2021-10-27 ENCOUNTER — Other Ambulatory Visit (HOSPITAL_COMMUNITY): Payer: Self-pay | Admitting: Family Medicine

## 2021-10-27 DIAGNOSIS — R928 Other abnormal and inconclusive findings on diagnostic imaging of breast: Secondary | ICD-10-CM | POA: Insufficient documentation

## 2021-10-27 NOTE — Telephone Encounter (Signed)
    Patient Name: Katelyn Kaufman  DOB: [birth year] MRN: [id number]  Primary Cardiologist: Rozann Lesches, MD  Chart reviewed as part of pre-operative protocol coverage.   Reported history of CAD in [place]. She states that she had 3 separate heart attacks in 2017, 2018, and 2019, receiving a stent intervention with each of these events.  Lexiscan and echo both normal in 2022.   Dr. Domenic Polite, Please give your recommendations regarding holding Plavix. Please forward your response to P [facility] PREOP.   Thank you

## 2021-10-28 NOTE — Telephone Encounter (Signed)
    Name: Katelyn Kaufman  DOB: 1951/01/02  MRN: [id number]  Primary Cardiologist: Rozann Lesches, MD   Preoperative team, please contact this patient and set up a phone call appointment for further preoperative risk assessment. Please obtain consent and complete medication review. Thank you for your help.  I confirm that guidance regarding antiplatelet and oral anticoagulation therapy has been completed and, if necessary, noted below.  Per Dr. Domenic Polite, primary cardiologist, patient may hold Plavix for 5 days prior to procedure. Please resume Plavix as soon as possible post-procedure, at the discretion of the surgeon.   Lenna Sciara, NP 10/28/2021, 9:39 AM [facility] [address] [place], [place] [address]

## 2021-10-29 ENCOUNTER — Telehealth: Payer: Self-pay | Admitting: *Deleted

## 2021-10-29 NOTE — Telephone Encounter (Signed)
Pt agreeable to plan of care for tele pre op appt 10/30/21 @ 3 pm. Med rec and consent are done.     Patient Consent for Virtual Visit        Katelyn Kaufman has provided verbal consent on 10/29/2021 for a virtual visit (video or telephone).   CONSENT FOR VIRTUAL VISIT FOR:  Katelyn Kaufman  By participating in this virtual visit I agree to the following:  I hereby voluntarily request, consent and authorize [facility] and its employed or contracted physicians, physician assistants, nurse practitioners or other licensed health care professionals (the Practitioner), to provide me with telemedicine health care services (the "Services") as deemed necessary by the treating Practitioner. I acknowledge and consent to receive the Services by the Practitioner via telemedicine. I understand that the telemedicine visit will involve communicating with the Practitioner through live audiovisual communication technology and the disclosure of certain medical information by electronic transmission. I acknowledge that I have been given the opportunity to request an in-person assessment or other available alternative prior to the telemedicine visit and am voluntarily participating in the telemedicine visit.  I understand that I have the right to withhold or withdraw my consent to the use of telemedicine in the course of my care at any time, without affecting my right to future care or treatment, and that the Practitioner or I may terminate the telemedicine visit at any time. I understand that I have the right to inspect all information obtained and/or recorded in the course of the telemedicine visit and may receive copies of available information for a reasonable fee.  I understand that some of the potential risks of receiving the Services via telemedicine include:  Delay or interruption in medical evaluation due to technological equipment failure or disruption; Information transmitted may not be sufficient (e.g.  poor resolution of images) to allow for appropriate medical decision making by the Practitioner; and/or  In rare instances, security protocols could fail, causing a breach of personal health information.  Furthermore, I acknowledge that it is my responsibility to provide information about my medical history, conditions and care that is complete and accurate to the best of my ability. I acknowledge that Practitioner's advice, recommendations, and/or decision may be based on factors not within their control, such as incomplete or inaccurate data provided by me or distortions of diagnostic images or specimens that may result from electronic transmissions. I understand that the practice of medicine is not an exact science and that Practitioner makes no warranties or guarantees regarding treatment outcomes. I acknowledge that a copy of this consent can be made available to me via my patient portal ([facility]), or I can request a printed copy by calling the office of [facility].    I understand that my insurance will be billed for this visit.   I have read or had this consent read to me. I understand the contents of this consent, which adequately explains the benefits and risks of the Services being provided via telemedicine.  I have been provided ample opportunity to ask questions regarding this consent and the Services and have had my questions answered to my satisfaction. I give my informed consent for the services to be provided through the use of telemedicine in my medical care

## 2021-10-29 NOTE — Telephone Encounter (Signed)
Pt agreeable to plan of care for tele pre op appt 10/30/21 @ 3 pm. Med rec and consent are done.

## 2021-10-30 ENCOUNTER — Ambulatory Visit (INDEPENDENT_AMBULATORY_CARE_PROVIDER_SITE_OTHER): Payer: Medicare HMO | Admitting: Nurse Practitioner

## 2021-10-30 DIAGNOSIS — Z01810 Encounter for preprocedural cardiovascular examination: Secondary | ICD-10-CM | POA: Diagnosis not present

## 2021-10-30 NOTE — Progress Notes (Signed)
Virtual Visit via Telephone Note   Because of Katelyn Kaufman's co-morbid illnesses, she is at least at moderate risk for complications without adequate follow up.  This format is felt to be most appropriate for this patient at this time.  The patient did not have access to video technology/had technical difficulties with video requiring transitioning to audio format only (telephone).  All issues noted in this document were discussed and addressed.  No physical exam could be performed with this format.  Please refer to the patient's chart for her consent to telehealth for Katelyn Kaufman.  Evaluation Performed:  Preoperative cardiovascular risk assessment _____________   Date:  10/30/2021   Patient ID:  Katelyn Kaufman, DOB [birth year], MRN [id number] Patient Location:  Home Provider location:   Office  Primary Care Provider:  Leeanne Rio, MD Primary Cardiologist:  Katelyn Lesches, MD  Chief Complaint / Patient Profile   71 y.o. y/o female with a h/o CAD (reported), with previous work-up in [place], details not clear, negative Lexiscan Myoview in March 7425, chronic diastolic heart failure, hypertension, hyperlipidemia, OSA, COPD, and type 2 diabetes who is pending lumbar epidural, date TBD, with [facility] and presents today for telephonic preoperative cardiovascular risk assessment.  Past Medical History    Past Medical History:  Diagnosis Date   Arthritis    CAD (coronary artery disease) 2018   COPD (chronic obstructive pulmonary disease) ([place])    Essential hypertension    Hyperlipidemia    Myocardial infarction ([facility])    OSA (obstructive sleep apnea)    Type 2 diabetes mellitus ([place])    Past Surgical History:  Procedure Laterality Date   BACK SURGERY     BREAST SURGERY     CATARACT EXTRACTION W/PHACO Right 10/24/2020   Procedure: CATARACT EXTRACTION PHACO AND INTRAOCULAR LENS PLACEMENT ([facility]);  Surgeon: Baruch Goldmann, MD;  Location: [facility];  Service:  Ophthalmology;  Laterality: Right;  CDE: 10.87   CATARACT EXTRACTION W/PHACO Left 12/01/2020   Procedure: CATARACT EXTRACTION PHACO AND INTRAOCULAR LENS PLACEMENT LEFT EYE;  Surgeon: Baruch Goldmann, MD;  Location: [facility];  Service: Ophthalmology;  Laterality: Left;  CDE  8.62   REPLACEMENT TOTAL KNEE Right    TUBAL LIGATION      Allergies  Allergies  Allergen Reactions   Latex Rash    History of Present Illness    Katelyn Kaufman is a 71 y.o. female who presents via audio/video conferencing for a telehealth visit today.  Pt was last seen in cardiology clinic on 08/11/2021 by Katelyn Kaufman. At that time Katelyn Kaufman was doing well overall from a cardiac standpoint.  However, she did report some mild fluid volume overload. Repeat echocardiogram in March 2023 showed EF 60 to 65%, no RWMA, G1 DD, no significant change from prior study. The patient is now pending procedure as outlined above. Since her last visit, she is stable overall from a cardiac standpoint.  She does report some ongoing mild dyspnea with exertion, however, this is unchanged since her prior visit and is not anything like her prior anginal equivalent.  She has stable bilateral lower extremity edema.  She states that she thinks some of her shortness of breath is related to anxiety as she has been experiencing a large amount of personal stress recently.  Additionally, her activity is somewhat limited in the setting of chronic back pain.  She denies chest pain, palpitations,  pnd, orthopnea, n, v, dizziness, syncope, weight gain, or early satiety. All other systems  reviewed and are otherwise negative except as noted above.   Home Medications    Prior to Admission medications   Medication Sig Start Date End Date Taking? Authorizing Provider  Ascorbic Acid (VITAMIN C) 1000 MG tablet Take 1,000 mg by mouth daily.    [provider]  aspirin EC 81 MG tablet Take 81 mg by mouth daily. Swallow whole.    [provider]   Calcium Carb-Cholecalciferol (CALCIUM-VITAMIN D3) 600-400 MG-UNIT TABS Take 1 capsule by mouth daily.    [provider]  carvedilol (COREG) 25 MG tablet Take 25 mg by mouth 2 (two) times daily with a meal.    [provider]  cholecalciferol (VITAMIN D3) 25 MCG (1000 UNIT) tablet Take 1,000 Units by mouth daily.    [provider]  clopidogrel (PLAVIX) 75 MG tablet TAKE 1 TABLET BY MOUTH EVERY DAY 08/06/21   Satira Sark, MD  cyclobenzaprine (FLEXERIL) 10 MG tablet Take 10 mg by mouth in the morning and at bedtime. 07/17/20   [provider]  DULoxetine (CYMBALTA) 60 MG capsule Take 60 mg by mouth 2 (two) times daily.    [provider]  ezetimibe (ZETIA) 10 MG tablet Take 10 mg by mouth daily.    [provider]  furosemide (LASIX) 40 MG tablet Take 1.5 tablets (60 mg total) by mouth daily. 07/28/21   Imogene Burn, PA-C  glimepiride (AMARYL) 4 MG tablet Take 4 mg by mouth in the morning and at bedtime.    [provider]  insulin glargine (LANTUS) 100 UNIT/ML injection Inject 40 Units into the skin at bedtime.    [provider]  isosorbide mononitrate (IMDUR) 60 MG 24 hr tablet Take 1 tablet (60 mg total) by mouth daily. 07/28/21 10/29/21  Imogene Burn, PA-C  losartan (COZAAR) 50 MG tablet Take 50 mg by mouth daily.    [provider]  metFORMIN (GLUCOPHAGE) 500 MG tablet Take 500 mg by mouth 2 (two) times daily with a meal. Patient not taking: Reported on 10/29/2021    [provider]  nitroGLYCERIN (NITROSTAT) 0.4 MG SL tablet Place 1 tablet (0.4 mg total) under the tongue every 5 (five) minutes as needed for chest pain. 07/28/21 10/29/21  Imogene Burn, PA-C  Omega-3 Fatty Acids (DIALYVITE OMEGA-3 CONCENTRATE) 600 MG CAPS Take 600 mg by mouth daily.    [provider]  oxyCODONE-acetaminophen (PERCOCET) 7.5-325 MG tablet Take 1.5 tablets by mouth in the morning, at noon, in the evening, and  at bedtime. 7am, 12pm, 5pm, 10pm 08/18/20   [provider]  rosuvastatin (CRESTOR) 10 MG tablet Take 10 mg by mouth daily.    [provider]  triamcinolone ointment (KENALOG) 0.1 % Apply topically. 08/10/21 08/10/22  [provider]  vitamin B-12 (CYANOCOBALAMIN) 1000 MCG tablet Take 1,000 mcg by mouth daily.    [provider]    Physical Exam    Vital Signs:  Katelyn Kaufman does not have vital signs available for review today.  Given telephonic nature of communication, physical exam is limited. AAOx3. NAD. Normal affect.  Speech and respirations are unlabored.  Accessory Clinical Findings    None  Assessment & Plan    1.  Preoperative Cardiovascular Risk Assessment:  According to the Revised Cardiac Risk Index (RCRI), her Perioperative Risk of Major Cardiac Event is (%): 6.6. Her Functional Capacity in METs is: 4.27 according to the [facility] Activity Status Index (DASI). Therefore, based on ACC/AHA guidelines, patient  would be at acceptable risk for the planned procedure without further cardiovascular testing.   It was advised that if she develops any Katelyn symptoms prior to surgery to contact our office to arrange follow-up appointment.  She verbalized understanding.  Per Dr. Domenic Kaufman, primary cardiologist, patient may hold Plavix for 5 days prior to procedure. Please resume Plavix as soon as possible post-procedure, at the discretion of the surgeon.   A copy of this note will be routed to requesting surgeon.  Time:   Today, I have spent 11 minutes with the patient with telehealth technology discussing medical history, symptoms, and management plan.     Lenna Sciara, NP  10/30/2021, 3:18 PM

## 2021-11-03 ENCOUNTER — Ambulatory Visit (HOSPITAL_COMMUNITY)
Admission: RE | Admit: 2021-11-03 | Discharge: 2021-11-03 | Disposition: A | Discharge status: Home / Self Care | Payer: Medicare HMO | Source: Ambulatory Visit | Attending: Family Medicine | Admitting: Family Medicine

## 2021-11-03 ENCOUNTER — Other Ambulatory Visit (HOSPITAL_COMMUNITY): Payer: Self-pay | Admitting: Family Medicine

## 2021-11-03 DIAGNOSIS — R928 Other abnormal and inconclusive findings on diagnostic imaging of breast: Secondary | ICD-10-CM

## 2021-11-03 MED ORDER — LIDOCAINE HCL (PF) 2 % IJ SOLN
INTRAMUSCULAR | Status: AC
  Filled 2021-11-03: qty 20

## 2021-11-03 MED ORDER — LIDOCAINE-EPINEPHRINE (PF) 1 %-1:200000 IJ SOLN
INTRAMUSCULAR | Status: AC
  Filled 2021-11-03: qty 30

## 2021-11-06 LAB — SURGICAL PATHOLOGY

## 2021-11-11 LAB — COLOGUARD: COLOGUARD: POSITIVE — AB

## 2021-11-13 ENCOUNTER — Telehealth: Payer: Self-pay | Admitting: Hematology

## 2021-11-16 ENCOUNTER — Inpatient Hospital Stay (HOSPITAL_COMMUNITY): Payer: Medicare HMO | Admitting: Certified Registered Nurse Anesthetist

## 2021-11-16 ENCOUNTER — Encounter (HOSPITAL_COMMUNITY)
Admission: EM | Disposition: A | Discharge status: Home Health | Payer: Self-pay | Source: Home / Self Care | Attending: Surgery

## 2021-11-16 ENCOUNTER — Emergency Department (HOSPITAL_COMMUNITY): Payer: Medicare HMO

## 2021-11-16 ENCOUNTER — Inpatient Hospital Stay (HOSPITAL_COMMUNITY)
Admission: EM | Disposition: A | Discharge status: Home Health | Payer: Self-pay | Source: Home / Self Care | Attending: Surgery

## 2021-11-16 ENCOUNTER — Inpatient Hospital Stay (HOSPITAL_COMMUNITY): Payer: Medicare HMO

## 2021-11-16 ENCOUNTER — Inpatient Hospital Stay (HOSPITAL_COMMUNITY)
Admission: EM | Admit: 2021-11-16 | Discharge: 2021-11-23 | DRG: 234 | Disposition: A | Discharge status: Home Health | Payer: Medicare HMO | Attending: Surgery | Admitting: Surgery

## 2021-11-16 ENCOUNTER — Encounter (HOSPITAL_COMMUNITY): Payer: Self-pay | Admitting: Cardiovascular Disease

## 2021-11-16 ENCOUNTER — Ambulatory Visit
Admission: RE | Admit: 2021-11-16 | Discharge: 2021-11-16 | Disposition: A | Discharge status: Home / Self Care | Payer: Medicare HMO | Source: Ambulatory Visit | Attending: Neurology | Admitting: Neurology

## 2021-11-16 DIAGNOSIS — Z91040 Latex allergy status: Secondary | ICD-10-CM

## 2021-11-16 DIAGNOSIS — I252 Old myocardial infarction: Secondary | ICD-10-CM | POA: Diagnosis not present

## 2021-11-16 DIAGNOSIS — D62 Acute posthemorrhagic anemia: Secondary | ICD-10-CM | POA: Diagnosis not present

## 2021-11-16 DIAGNOSIS — I2510 Atherosclerotic heart disease of native coronary artery without angina pectoris: Secondary | ICD-10-CM | POA: Diagnosis present

## 2021-11-16 DIAGNOSIS — E1165 Type 2 diabetes mellitus with hyperglycemia: Secondary | ICD-10-CM | POA: Diagnosis present

## 2021-11-16 DIAGNOSIS — Z6841 Body Mass Index (BMI) 40.0 and over, adult: Secondary | ICD-10-CM | POA: Diagnosis not present

## 2021-11-16 DIAGNOSIS — E118 Type 2 diabetes mellitus with unspecified complications: Secondary | ICD-10-CM

## 2021-11-16 DIAGNOSIS — I2101 ST elevation (STEMI) myocardial infarction involving left main coronary artery: Secondary | ICD-10-CM | POA: Diagnosis not present

## 2021-11-16 DIAGNOSIS — D6959 Other secondary thrombocytopenia: Secondary | ICD-10-CM | POA: Diagnosis not present

## 2021-11-16 DIAGNOSIS — Z9851 Tubal ligation status: Secondary | ICD-10-CM

## 2021-11-16 DIAGNOSIS — Z7982 Long term (current) use of aspirin: Secondary | ICD-10-CM

## 2021-11-16 DIAGNOSIS — Z96651 Presence of right artificial knee joint: Secondary | ICD-10-CM | POA: Diagnosis present

## 2021-11-16 DIAGNOSIS — Z7984 Long term (current) use of oral hypoglycemic drugs: Secondary | ICD-10-CM | POA: Diagnosis not present

## 2021-11-16 DIAGNOSIS — E785 Hyperlipidemia, unspecified: Secondary | ICD-10-CM

## 2021-11-16 DIAGNOSIS — I10 Essential (primary) hypertension: Secondary | ICD-10-CM

## 2021-11-16 DIAGNOSIS — I214 Non-ST elevation (NSTEMI) myocardial infarction: Secondary | ICD-10-CM | POA: Diagnosis present

## 2021-11-16 DIAGNOSIS — E8770 Fluid overload, unspecified: Secondary | ICD-10-CM | POA: Diagnosis not present

## 2021-11-16 DIAGNOSIS — E1169 Type 2 diabetes mellitus with other specified complication: Secondary | ICD-10-CM

## 2021-11-16 DIAGNOSIS — C50919 Malignant neoplasm of unspecified site of unspecified female breast: Secondary | ICD-10-CM | POA: Diagnosis present

## 2021-11-16 DIAGNOSIS — I219 Acute myocardial infarction, unspecified: Secondary | ICD-10-CM | POA: Diagnosis present

## 2021-11-16 DIAGNOSIS — Z794 Long term (current) use of insulin: Secondary | ICD-10-CM | POA: Diagnosis not present

## 2021-11-16 DIAGNOSIS — I249 Acute ischemic heart disease, unspecified: Principal | ICD-10-CM

## 2021-11-16 DIAGNOSIS — Z7902 Long term (current) use of antithrombotics/antiplatelets: Secondary | ICD-10-CM | POA: Diagnosis not present

## 2021-11-16 DIAGNOSIS — M5416 Radiculopathy, lumbar region: Secondary | ICD-10-CM

## 2021-11-16 DIAGNOSIS — I25110 Atherosclerotic heart disease of native coronary artery with unstable angina pectoris: Secondary | ICD-10-CM

## 2021-11-16 DIAGNOSIS — M1990 Unspecified osteoarthritis, unspecified site: Secondary | ICD-10-CM | POA: Diagnosis present

## 2021-11-16 DIAGNOSIS — G4733 Obstructive sleep apnea (adult) (pediatric): Secondary | ICD-10-CM | POA: Diagnosis present

## 2021-11-16 DIAGNOSIS — Z8249 Family history of ischemic heart disease and other diseases of the circulatory system: Secondary | ICD-10-CM

## 2021-11-16 DIAGNOSIS — Z825 Family history of asthma and other chronic lower respiratory diseases: Secondary | ICD-10-CM

## 2021-11-16 DIAGNOSIS — Z955 Presence of coronary angioplasty implant and graft: Secondary | ICD-10-CM

## 2021-11-16 DIAGNOSIS — J449 Chronic obstructive pulmonary disease, unspecified: Secondary | ICD-10-CM | POA: Diagnosis present

## 2021-11-16 DIAGNOSIS — Z951 Presence of aortocoronary bypass graft: Secondary | ICD-10-CM

## 2021-11-16 DIAGNOSIS — F17210 Nicotine dependence, cigarettes, uncomplicated: Secondary | ICD-10-CM | POA: Diagnosis present

## 2021-11-16 DIAGNOSIS — I25119 Atherosclerotic heart disease of native coronary artery with unspecified angina pectoris: Secondary | ICD-10-CM | POA: Diagnosis present

## 2021-11-16 DIAGNOSIS — T82855A Stenosis of coronary artery stent, initial encounter: Secondary | ICD-10-CM | POA: Diagnosis present

## 2021-11-16 DIAGNOSIS — K5900 Constipation, unspecified: Secondary | ICD-10-CM | POA: Diagnosis not present

## 2021-11-16 DIAGNOSIS — E782 Mixed hyperlipidemia: Secondary | ICD-10-CM | POA: Diagnosis present

## 2021-11-16 DIAGNOSIS — Z79899 Other long term (current) drug therapy: Secondary | ICD-10-CM | POA: Diagnosis not present

## 2021-11-16 HISTORY — PX: IABP INSERTION: CATH118242

## 2021-11-16 HISTORY — PX: CORONARY ARTERY BYPASS GRAFT: SHX141

## 2021-11-16 HISTORY — PX: LEFT HEART CATH AND CORONARY ANGIOGRAPHY: CATH118249

## 2021-11-16 HISTORY — PX: TEE WITHOUT CARDIOVERSION: SHX5443

## 2021-11-16 LAB — POCT I-STAT, CHEM 8
BUN: 11 mg/dL (ref 8–23)
BUN: 11 mg/dL (ref 8–23)
Calcium, Ion: 1.32 mmol/L (ref 1.15–1.40)
Calcium, Ion: 1.38 mmol/L (ref 1.15–1.40)
Chloride: 104 mmol/L (ref 98–111)
Chloride: 107 mmol/L (ref 98–111)
Creatinine, Ser: 0.9 mg/dL (ref 0.44–1.00)
Creatinine, Ser: 1 mg/dL (ref 0.44–1.00)
Glucose, Bld: 195 mg/dL — ABNORMAL HIGH (ref 70–99)
Glucose, Bld: 170 mg/dL — ABNORMAL HIGH (ref 70–99)
HCT: 37 % (ref 36.0–46.0)
HCT: 29 % — ABNORMAL LOW (ref 36.0–46.0)
Hemoglobin: 12.6 g/dL (ref 12.0–15.0)
Hemoglobin: 9.9 g/dL — ABNORMAL LOW (ref 12.0–15.0)
Potassium: 4.5 mmol/L (ref 3.5–5.1)
Potassium: 3.8 mmol/L (ref 3.5–5.1)
Sodium: 137 mmol/L (ref 135–145)
Sodium: 136 mmol/L (ref 135–145)
TCO2: 24 mmol/L (ref 22–32)
TCO2: 24 mmol/L (ref 22–32)

## 2021-11-16 LAB — COMPREHENSIVE METABOLIC PANEL
ALT: 15 U/L (ref 0–44)
AST: 16 U/L (ref 15–41)
Albumin: 3.5 g/dL (ref 3.5–5.0)
Alkaline Phosphatase: 61 U/L (ref 38–126)
Anion gap: 6 (ref 5–15)
BUN: 12 mg/dL (ref 8–23)
CO2: 25 mmol/L (ref 22–32)
Calcium: 10.2 mg/dL (ref 8.9–10.3)
Chloride: 105 mmol/L (ref 98–111)
Creatinine, Ser: 1.14 mg/dL — ABNORMAL HIGH (ref 0.44–1.00)
GFR, Estimated: 52 mL/min — ABNORMAL LOW (ref >60)
Glucose, Bld: 243 mg/dL — ABNORMAL HIGH (ref 70–99)
Potassium: 4.8 mmol/L (ref 3.5–5.1)
Sodium: 136 mmol/L (ref 135–145)
Total Bilirubin: 0.4 mg/dL (ref 0.3–1.2)
Total Protein: 6.6 g/dL (ref 6.5–8.1)

## 2021-11-16 LAB — PREPARE RBC (CROSSMATCH)

## 2021-11-16 LAB — CBC WITH DIFFERENTIAL/PLATELET
Abs Immature Granulocytes: 0.01 10*3/uL (ref 0.00–0.07)
Basophils Absolute: 0 10*3/uL (ref 0.0–0.1)
Basophils Relative: 0 %
Eosinophils Absolute: 0 10*3/uL (ref 0.0–0.5)
Eosinophils Relative: 0 %
HCT: 40.3 % (ref 36.0–46.0)
Hemoglobin: 13.4 g/dL (ref 12.0–15.0)
Immature Granulocytes: 0 %
Lymphocytes Relative: 43 %
Lymphs Abs: 2.4 10*3/uL (ref 0.7–4.0)
MCH: 30.2 pg (ref 26.0–34.0)
MCHC: 33.3 g/dL (ref 30.0–36.0)
MCV: 90.8 fL (ref 80.0–100.0)
Monocytes Absolute: 0.6 10*3/uL (ref 0.1–1.0)
Monocytes Relative: 10 %
Neutro Abs: 2.5 10*3/uL (ref 1.7–7.7)
Neutrophils Relative %: 47 %
Platelets: 208 10*3/uL (ref 150–400)
RBC: 4.44 MIL/uL (ref 3.87–5.11)
RDW: 13.4 % (ref 11.5–15.5)
WBC: 5.5 10*3/uL (ref 4.0–10.5)
nRBC: 0 % (ref 0.0–0.2)

## 2021-11-16 LAB — POCT ACTIVATED CLOTTING TIME: Activated Clotting Time: 317 seconds

## 2021-11-16 LAB — POCT I-STAT 7, (LYTES, BLD GAS, ICA,H+H)
Acid-base deficit: 2 mmol/L (ref 0.0–2.0)
Bicarbonate: 22.4 mmol/L (ref 20.0–28.0)
Calcium, Ion: 1.38 mmol/L (ref 1.15–1.40)
HCT: 33 % — ABNORMAL LOW (ref 36.0–46.0)
Hemoglobin: 11.2 g/dL — ABNORMAL LOW (ref 12.0–15.0)
O2 Saturation: 100 %
Potassium: 4.2 mmol/L (ref 3.5–5.1)
Sodium: 136 mmol/L (ref 135–145)
TCO2: 23 mmol/L (ref 22–32)
pCO2 arterial: 37.9 mmHg (ref 32–48)
pH, Arterial: 7.379 (ref 7.35–7.45)
pO2, Arterial: 206 mmHg — ABNORMAL HIGH (ref 83–108)

## 2021-11-16 LAB — PROTIME-INR
INR: 1.1 (ref 0.8–1.2)
Prothrombin Time: 13.9 seconds (ref 11.4–15.2)

## 2021-11-16 LAB — ECHO INTRAOPERATIVE TEE
AR max vel: 3.06 cm2
AV Area VTI: 2.81 cm2
AV Area mean vel: 3.42 cm2
AV Mean grad: 2 mmHg
AV Peak grad: 6 mmHg
Ao pk vel: 1.22 m/s
Height: 64 in
MV VTI: 3.07 cm2
Weight: 4035.2 oz

## 2021-11-16 LAB — TROPONIN I (HIGH SENSITIVITY): Troponin I (High Sensitivity): 331 ng/L (ref <18)

## 2021-11-16 LAB — PLATELET COUNT: Platelets: 120 10*3/uL — ABNORMAL LOW (ref 150–400)

## 2021-11-16 LAB — ABO/RH: ABO/RH(D): A POS

## 2021-11-16 LAB — HEMOGLOBIN AND HEMATOCRIT, BLOOD
HCT: 23.1 % — ABNORMAL LOW (ref 36.0–46.0)
Hemoglobin: 8.1 g/dL — ABNORMAL LOW (ref 12.0–15.0)

## 2021-11-16 SURGERY — LEFT HEART CATH AND CORONARY ANGIOGRAPHY
Anesthesia: LOCAL

## 2021-11-16 SURGERY — CORONARY ARTERY BYPASS GRAFTING (CABG)
Anesthesia: General | Site: Chest

## 2021-11-16 MED ORDER — METOPROLOL TARTRATE 5 MG/5ML IV SOLN
INTRAVENOUS | Status: AC
  Filled 2021-11-16: qty 5

## 2021-11-16 MED ORDER — FENTANYL CITRATE (PF) 250 MCG/5ML IJ SOLN
INTRAMUSCULAR | Status: AC
  Filled 2021-11-16: qty 5

## 2021-11-16 MED ORDER — ACETAMINOPHEN 650 MG RE SUPP
650.0000 mg | Freq: Once | RECTAL | Status: AC
  Administered 2021-11-17: 650 mg via RECTAL

## 2021-11-16 MED ORDER — PROPOFOL 10 MG/ML IV BOLUS
INTRAVENOUS | Status: AC
  Filled 2021-11-16: qty 20

## 2021-11-16 MED ORDER — LACTATED RINGERS IV SOLN: INTRAVENOUS | Status: DC | PRN

## 2021-11-16 MED ORDER — CEFAZOLIN SODIUM-DEXTROSE 2-4 GM/100ML-% IV SOLN
2.0000 g | INTRAVENOUS | Status: AC
  Administered 2021-11-16: 2 g via INTRAVENOUS
  Filled 2021-11-16: qty 100

## 2021-11-16 MED ORDER — FAMOTIDINE IN NACL 20-0.9 MG/50ML-% IV SOLN
20.0000 mg | Freq: Two times a day (BID) | INTRAVENOUS | Status: AC
  Administered 2021-11-17 (×2): 20 mg via INTRAVENOUS
  Filled 2021-11-16 (×2): qty 50

## 2021-11-16 MED ORDER — PLASMA-LYTE A IV SOLN: INTRAVENOUS | Status: DC | PRN

## 2021-11-16 MED ORDER — HEPARIN SODIUM (PORCINE) 1000 UNIT/ML IJ SOLN
INTRAMUSCULAR | Status: DC | PRN
  Administered 2021-11-16: 41000 [IU] via INTRAVENOUS

## 2021-11-16 MED ORDER — VANCOMYCIN HCL 1500 MG/300ML IV SOLN
1500.0000 mg | INTRAVENOUS | Status: DC
  Filled 2021-11-16: qty 300

## 2021-11-16 MED ORDER — NITROGLYCERIN IN D5W 200-5 MCG/ML-% IV SOLN
0.0000 ug/min | INTRAVENOUS | Status: DC
  Administered 2021-11-16: 5 ug/min via INTRAVENOUS
  Filled 2021-11-16: qty 250

## 2021-11-16 MED ORDER — DEXMEDETOMIDINE HCL IN NACL 400 MCG/100ML IV SOLN
0.1000 ug/kg/h | INTRAVENOUS | Status: AC
  Administered 2021-11-16: .7 ug/kg/h via INTRAVENOUS
  Filled 2021-11-16: qty 100

## 2021-11-16 MED ORDER — HEPARIN BOLUS VIA INFUSION
4000.0000 [IU] | Freq: Once | INTRAVENOUS | Status: AC
  Administered 2021-11-16: 4000 [IU] via INTRAVENOUS
  Filled 2021-11-16: qty 4000

## 2021-11-16 MED ORDER — TRANEXAMIC ACID (OHS) PUMP PRIME SOLUTION
2.0000 mg/kg | INTRAVENOUS | Status: DC
  Filled 2021-11-16 (×2): qty 2.29

## 2021-11-16 MED ORDER — MORPHINE SULFATE (PF) 2 MG/ML IV SOLN
1.0000 mg | INTRAVENOUS | Status: DC | PRN
  Administered 2021-11-17: 4 mg via INTRAVENOUS
  Administered 2021-11-17 (×4): 2 mg via INTRAVENOUS
  Administered 2021-11-17: 4 mg via INTRAVENOUS
  Administered 2021-11-17 (×2): 2 mg via INTRAVENOUS
  Administered 2021-11-18 (×3): 4 mg via INTRAVENOUS
  Administered 2021-11-18: 2 mg via INTRAVENOUS
  Administered 2021-11-18: 4 mg via INTRAVENOUS
  Filled 2021-11-16: qty 2
  Filled 2021-11-16 (×4): qty 1
  Filled 2021-11-16 (×2): qty 2
  Filled 2021-11-16: qty 1
  Filled 2021-11-16 (×2): qty 2
  Filled 2021-11-16: qty 1
  Filled 2021-11-16: qty 2
  Filled 2021-11-16: qty 1

## 2021-11-16 MED ORDER — MIDAZOLAM HCL 2 MG/2ML IJ SOLN
INTRAMUSCULAR | Status: DC | PRN
  Administered 2021-11-16 (×2): 1 mg via INTRAVENOUS

## 2021-11-16 MED ORDER — NOREPINEPHRINE 4 MG/250ML-% IV SOLN
0.0000 ug/min | INTRAVENOUS | Status: DC
  Filled 2021-11-16: qty 250

## 2021-11-16 MED ORDER — ASPIRIN 325 MG PO TBEC
325.0000 mg | DELAYED_RELEASE_TABLET | Freq: Every day | ORAL | Status: DC
  Administered 2021-11-18 – 2021-11-19 (×2): 325 mg via ORAL
  Filled 2021-11-16 (×2): qty 1

## 2021-11-16 MED ORDER — ASPIRIN 81 MG PO CHEW
324.0000 mg | CHEWABLE_TABLET | Freq: Every day | ORAL | Status: DC
  Administered 2021-11-17: 324 mg
  Filled 2021-11-16: qty 4

## 2021-11-16 MED ORDER — SODIUM CHLORIDE 0.9 % IV SOLN: 250.0000 mL | INTRAVENOUS | Status: DC | PRN

## 2021-11-16 MED ORDER — MIDAZOLAM HCL 2 MG/2ML IJ SOLN
INTRAMUSCULAR | Status: AC
  Filled 2021-11-16: qty 2

## 2021-11-16 MED ORDER — ETOMIDATE 2 MG/ML IV SOLN
INTRAVENOUS | Status: AC
  Filled 2021-11-16: qty 10

## 2021-11-16 MED ORDER — MANNITOL 20 % IV SOLN
INTRAVENOUS | Status: DC
  Filled 2021-11-16: qty 13

## 2021-11-16 MED ORDER — ROCURONIUM BROMIDE 10 MG/ML (PF) SYRINGE
PREFILLED_SYRINGE | INTRAVENOUS | Status: AC
Start: 2021-11-16 — End: ?
  Filled 2021-11-16: qty 30

## 2021-11-16 MED ORDER — ACETAMINOPHEN 325 MG PO TABS: 650.0000 mg | ORAL_TABLET | ORAL | Status: DC | PRN

## 2021-11-16 MED ORDER — SODIUM CHLORIDE 0.45 % IV SOLN: INTRAVENOUS | Status: DC | PRN

## 2021-11-16 MED ORDER — DEXMEDETOMIDINE HCL IN NACL 400 MCG/100ML IV SOLN
0.0000 ug/kg/h | INTRAVENOUS | Status: DC
  Administered 2021-11-17 (×2): 0.7 ug/kg/h via INTRAVENOUS
  Filled 2021-11-16 (×2): qty 100

## 2021-11-16 MED ORDER — ONDANSETRON HCL 4 MG/2ML IJ SOLN: 4.0000 mg | Freq: Four times a day (QID) | INTRAMUSCULAR | Status: DC | PRN

## 2021-11-16 MED ORDER — EZETIMIBE 10 MG PO TABS
10.0000 mg | ORAL_TABLET | Freq: Every day | ORAL | Status: DC
  Administered 2021-11-17 – 2021-11-23 (×7): 10 mg via ORAL
  Filled 2021-11-16 (×7): qty 1

## 2021-11-16 MED ORDER — VANCOMYCIN HCL IN DEXTROSE 1-5 GM/200ML-% IV SOLN
1000.0000 mg | Freq: Once | INTRAVENOUS | Status: AC
  Administered 2021-11-17: 1000 mg via INTRAVENOUS
  Filled 2021-11-16: qty 200

## 2021-11-16 MED ORDER — MAGNESIUM SULFATE 4 GM/100ML IV SOLN
4.0000 g | Freq: Once | INTRAVENOUS | Status: AC
  Administered 2021-11-17: 4 g via INTRAVENOUS
  Filled 2021-11-16: qty 100

## 2021-11-16 MED ORDER — SODIUM CHLORIDE 0.9 % IV SOLN: INTRAVENOUS | Status: DC | PRN

## 2021-11-16 MED ORDER — HEPARIN SODIUM (PORCINE) 1000 UNIT/ML IJ SOLN
INTRAMUSCULAR | Status: AC
  Filled 2021-11-16: qty 1

## 2021-11-16 MED ORDER — LACTATED RINGERS IV SOLN: 500.0000 mL | Freq: Once | INTRAVENOUS | Status: DC | PRN

## 2021-11-16 MED ORDER — LIDOCAINE HCL (PF) 1 % IJ SOLN
INTRAMUSCULAR | Status: AC
  Filled 2021-11-16: qty 30

## 2021-11-16 MED ORDER — FENTANYL CITRATE (PF) 100 MCG/2ML IJ SOLN
INTRAMUSCULAR | Status: AC
  Filled 2021-11-16: qty 2

## 2021-11-16 MED ORDER — TRAMADOL HCL 50 MG PO TABS
50.0000 mg | ORAL_TABLET | Freq: Four times a day (QID) | ORAL | Status: DC | PRN
  Administered 2021-11-18 – 2021-11-19 (×3): 50 mg via ORAL
  Filled 2021-11-16 (×3): qty 1

## 2021-11-16 MED ORDER — FENTANYL CITRATE (PF) 100 MCG/2ML IJ SOLN
INTRAMUSCULAR | Status: DC | PRN
  Administered 2021-11-16 (×2): 25 ug via INTRAVENOUS

## 2021-11-16 MED ORDER — SODIUM CHLORIDE 0.9% FLUSH
3.0000 mL | Freq: Two times a day (BID) | INTRAVENOUS | Status: DC
  Administered 2021-11-17 – 2021-11-18 (×3): 3 mL via INTRAVENOUS

## 2021-11-16 MED ORDER — TRANEXAMIC ACID (OHS) BOLUS VIA INFUSION
15.0000 mg/kg | INTRAVENOUS | Status: AC
  Administered 2021-11-16: 1716 mg via INTRAVENOUS
  Filled 2021-11-16: qty 1716

## 2021-11-16 MED ORDER — ROSUVASTATIN CALCIUM 5 MG PO TABS
10.0000 mg | ORAL_TABLET | Freq: Every day | ORAL | Status: DC
  Administered 2021-11-17 – 2021-11-23 (×7): 10 mg via ORAL
  Filled 2021-11-16 (×7): qty 2

## 2021-11-16 MED ORDER — PROTAMINE SULFATE 10 MG/ML IV SOLN
INTRAVENOUS | Status: AC
  Filled 2021-11-16: qty 25

## 2021-11-16 MED ORDER — MIDAZOLAM HCL 5 MG/5ML IJ SOLN
INTRAMUSCULAR | Status: DC | PRN
  Administered 2021-11-16: 3 mg via INTRAVENOUS
  Administered 2021-11-16 (×2): 1 mg via INTRAVENOUS
  Administered 2021-11-16: 2 mg via INTRAVENOUS
  Administered 2021-11-16: 3 mg via INTRAVENOUS

## 2021-11-16 MED ORDER — POTASSIUM CHLORIDE 2 MEQ/ML IV SOLN
80.0000 meq | INTRAVENOUS | Status: DC
  Filled 2021-11-16: qty 40

## 2021-11-16 MED ORDER — DEXTROSE 50 % IV SOLN: 0.0000 mL | INTRAVENOUS | Status: DC | PRN

## 2021-11-16 MED ORDER — ACETAMINOPHEN 500 MG PO TABS
1000.0000 mg | ORAL_TABLET | Freq: Four times a day (QID) | ORAL | Status: AC
  Administered 2021-11-17 – 2021-11-21 (×14): 1000 mg via ORAL
  Filled 2021-11-16 (×16): qty 2

## 2021-11-16 MED ORDER — PREGABALIN 75 MG PO CAPS: 150.0000 mg | ORAL_CAPSULE | Freq: Every morning | ORAL | Status: DC

## 2021-11-16 MED ORDER — HEMOSTATIC AGENTS (NO CHARGE) OPTIME
TOPICAL | Status: DC | PRN
  Administered 2021-11-16: 1 via TOPICAL

## 2021-11-16 MED ORDER — PLASMA-LYTE A IV SOLN
INTRAVENOUS | Status: DC
  Filled 2021-11-16: qty 2.5

## 2021-11-16 MED ORDER — PROTAMINE SULFATE 10 MG/ML IV SOLN
INTRAVENOUS | Status: DC | PRN
  Administered 2021-11-16: 410 mg via INTRAVENOUS

## 2021-11-16 MED ORDER — VANCOMYCIN HCL 1500 MG/300ML IV SOLN: 1500.0000 mg | INTRAVENOUS | Status: DC

## 2021-11-16 MED ORDER — METOPROLOL TARTRATE 5 MG/5ML IV SOLN
INTRAVENOUS | Status: DC | PRN
  Administered 2021-11-16 (×2): 2.5 mg via INTRAVENOUS

## 2021-11-16 MED ORDER — SODIUM CHLORIDE 0.9% FLUSH: 3.0000 mL | INTRAVENOUS | Status: DC | PRN

## 2021-11-16 MED ORDER — HEMOSTATIC AGENTS (NO CHARGE) OPTIME
TOPICAL | Status: DC | PRN
  Administered 2021-11-16: 3 via TOPICAL

## 2021-11-16 MED ORDER — PHENYLEPHRINE HCL-NACL 20-0.9 MG/250ML-% IV SOLN
0.0000 ug/min | INTRAVENOUS | Status: DC
  Administered 2021-11-17: 20 ug/min via INTRAVENOUS

## 2021-11-16 MED ORDER — FENTANYL CITRATE (PF) 250 MCG/5ML IJ SOLN
INTRAMUSCULAR | Status: DC | PRN
  Administered 2021-11-16: 25 ug via INTRAVENOUS
  Administered 2021-11-16: 250 ug via INTRAVENOUS
  Administered 2021-11-16: 150 ug via INTRAVENOUS
  Administered 2021-11-16: 250 ug via INTRAVENOUS
  Administered 2021-11-16: 100 ug via INTRAVENOUS
  Administered 2021-11-16: 150 ug via INTRAVENOUS
  Administered 2021-11-16: 100 ug via INTRAVENOUS
  Administered 2021-11-16: 50 ug via INTRAVENOUS
  Administered 2021-11-16: 100 ug via INTRAVENOUS
  Administered 2021-11-16: 25 ug via INTRAVENOUS
  Administered 2021-11-16: 150 ug via INTRAVENOUS
  Administered 2021-11-16: 50 ug via INTRAVENOUS
  Administered 2021-11-16 (×2): 100 ug via INTRAVENOUS

## 2021-11-16 MED ORDER — MIDAZOLAM HCL 2 MG/2ML IJ SOLN: 2.0000 mg | INTRAMUSCULAR | Status: DC | PRN

## 2021-11-16 MED ORDER — INSULIN REGULAR(HUMAN) IN NACL 100-0.9 UT/100ML-% IV SOLN: INTRAVENOUS | Status: DC

## 2021-11-16 MED ORDER — LIDOCAINE 2% (20 MG/ML) 5 ML SYRINGE
INTRAMUSCULAR | Status: DC | PRN
  Administered 2021-11-16: 100 mg via INTRAVENOUS

## 2021-11-16 MED ORDER — ALBUMIN HUMAN 5 % IV SOLN: INTRAVENOUS | Status: DC | PRN

## 2021-11-16 MED ORDER — ACETAMINOPHEN 160 MG/5ML PO SOLN
1000.0000 mg | Freq: Four times a day (QID) | ORAL | Status: AC
  Administered 2021-11-17 (×2): 1000 mg
  Filled 2021-11-16 (×2): qty 40.6

## 2021-11-16 MED ORDER — MAGNESIUM SULFATE 50 % IJ SOLN
40.0000 meq | INTRAMUSCULAR | Status: DC
  Filled 2021-11-16: qty 9.85

## 2021-11-16 MED ORDER — LABETALOL HCL 5 MG/ML IV SOLN
INTRAVENOUS | Status: AC
  Filled 2021-11-16: qty 4

## 2021-11-16 MED ORDER — ETOMIDATE 2 MG/ML IV SOLN
INTRAVENOUS | Status: DC | PRN
  Administered 2021-11-16: 20 mg via INTRAVENOUS

## 2021-11-16 MED ORDER — SODIUM CHLORIDE 0.9 % IV SOLN: INTRAVENOUS | Status: AC

## 2021-11-16 MED ORDER — PROTAMINE SULFATE 10 MG/ML IV SOLN
INTRAVENOUS | Status: AC
  Filled 2021-11-16: qty 10

## 2021-11-16 MED ORDER — METOPROLOL TARTRATE 12.5 MG HALF TABLET
12.5000 mg | ORAL_TABLET | Freq: Two times a day (BID) | ORAL | Status: DC
  Administered 2021-11-18 (×2): 12.5 mg via ORAL
  Filled 2021-11-16 (×2): qty 1

## 2021-11-16 MED ORDER — NITROGLYCERIN IN D5W 200-5 MCG/ML-% IV SOLN
2.0000 ug/min | INTRAVENOUS | Status: AC
  Administered 2021-11-16: 40 ug/min via INTRAVENOUS
  Filled 2021-11-16: qty 250

## 2021-11-16 MED ORDER — TRANEXAMIC ACID 1000 MG/10ML IV SOLN
1.5000 mg/kg/h | INTRAVENOUS | Status: AC
  Administered 2021-11-16: 1.5 mg/kg/h via INTRAVENOUS
  Filled 2021-11-16: qty 25

## 2021-11-16 MED ORDER — LABETALOL HCL 5 MG/ML IV SOLN: 10.0000 mg | INTRAVENOUS | Status: AC | PRN

## 2021-11-16 MED ORDER — PHENYLEPHRINE HCL-NACL 20-0.9 MG/250ML-% IV SOLN
30.0000 ug/min | INTRAVENOUS | Status: DC
  Filled 2021-11-16: qty 250

## 2021-11-16 MED ORDER — EPINEPHRINE HCL 5 MG/250ML IV SOLN IN NS
0.0000 ug/min | INTRAVENOUS | Status: DC
  Filled 2021-11-16: qty 250

## 2021-11-16 MED ORDER — IOHEXOL 350 MG/ML SOLN
INTRAVENOUS | Status: DC | PRN
  Administered 2021-11-16: 45 mL

## 2021-11-16 MED ORDER — DOCUSATE SODIUM 50 MG/5ML PO LIQD
200.0000 mg | Freq: Every day | ORAL | Status: DC
Start: 2021-11-17 — End: 2021-11-19
  Administered 2021-11-19: 200 mg via ORAL
  Filled 2021-11-16: qty 20

## 2021-11-16 MED ORDER — ONDANSETRON HCL 4 MG/2ML IJ SOLN
4.0000 mg | Freq: Four times a day (QID) | INTRAMUSCULAR | Status: DC | PRN
  Administered 2021-11-18 – 2021-11-22 (×4): 4 mg via INTRAVENOUS
  Filled 2021-11-16 (×4): qty 2

## 2021-11-16 MED ORDER — CEFAZOLIN SODIUM-DEXTROSE 2-4 GM/100ML-% IV SOLN
2.0000 g | Freq: Three times a day (TID) | INTRAVENOUS | Status: AC
  Administered 2021-11-17 – 2021-11-18 (×6): 2 g via INTRAVENOUS
  Filled 2021-11-16 (×6): qty 100

## 2021-11-16 MED ORDER — METOPROLOL TARTRATE 5 MG/5ML IV SOLN: 2.5000 mg | INTRAVENOUS | Status: DC | PRN

## 2021-11-16 MED ORDER — PROPOFOL 10 MG/ML IV BOLUS
INTRAVENOUS | Status: DC | PRN
  Administered 2021-11-16: 30 mg via INTRAVENOUS

## 2021-11-16 MED ORDER — SODIUM CHLORIDE 0.9% FLUSH
3.0000 mL | Freq: Two times a day (BID) | INTRAVENOUS | Status: DC
Start: 2021-11-17 — End: 2021-11-19
  Administered 2021-11-17 – 2021-11-19 (×5): 3 mL via INTRAVENOUS

## 2021-11-16 MED ORDER — SODIUM CHLORIDE 0.9 % IV SOLN: INTRAVENOUS | Status: DC

## 2021-11-16 MED ORDER — DULOXETINE HCL 60 MG PO CPEP
60.0000 mg | ORAL_CAPSULE | Freq: Every day | ORAL | Status: DC
  Administered 2021-11-17 – 2021-11-23 (×7): 60 mg via ORAL
  Filled 2021-11-16 (×7): qty 1

## 2021-11-16 MED ORDER — CYCLOBENZAPRINE HCL 10 MG PO TABS: 10.0000 mg | ORAL_TABLET | Freq: Two times a day (BID) | ORAL | Status: DC | PRN

## 2021-11-16 MED ORDER — MORPHINE SULFATE (PF) 2 MG/ML IV SOLN: 2.0000 mg | Freq: Once | INTRAVENOUS | Status: AC

## 2021-11-16 MED ORDER — LACTATED RINGERS IV SOLN
INTRAVENOUS | Status: DC
Start: 2021-11-17 — End: 2021-11-19

## 2021-11-16 MED ORDER — MIDAZOLAM HCL (PF) 10 MG/2ML IJ SOLN
INTRAMUSCULAR | Status: AC
  Filled 2021-11-16: qty 2

## 2021-11-16 MED ORDER — INSULIN REGULAR(HUMAN) IN NACL 100-0.9 UT/100ML-% IV SOLN
INTRAVENOUS | Status: AC
  Administered 2021-11-16: 6.5 [IU]/h via INTRAVENOUS
  Filled 2021-11-16: qty 100

## 2021-11-16 MED ORDER — HEPARIN (PORCINE) 25000 UT/250ML-% IV SOLN
1000.0000 [IU]/h | INTRAVENOUS | Status: DC
  Administered 2021-11-16: 1000 [IU]/h via INTRAVENOUS
  Filled 2021-11-16: qty 250

## 2021-11-16 MED ORDER — 0.9 % SODIUM CHLORIDE (POUR BTL) OPTIME
TOPICAL | Status: DC | PRN
  Administered 2021-11-16: 1000 mL

## 2021-11-16 MED ORDER — HEPARIN 30,000 UNITS/1000 ML (OHS) CELLSAVER SOLUTION
Status: DC
  Filled 2021-11-16: qty 1000

## 2021-11-16 MED ORDER — HEPARIN SODIUM (PORCINE) 1000 UNIT/ML IJ SOLN
INTRAMUSCULAR | Status: AC
  Filled 2021-11-16: qty 10

## 2021-11-16 MED ORDER — SODIUM CHLORIDE 0.9 % IV SOLN: 250.0000 mL | INTRAVENOUS | Status: DC

## 2021-11-16 MED ORDER — VANCOMYCIN HCL 1500 MG/300ML IV SOLN
1500.0000 mg | INTRAVENOUS | Status: AC
  Administered 2021-11-16: 1500 mg via INTRAVENOUS
  Filled 2021-11-16: qty 300

## 2021-11-16 MED ORDER — OXYCODONE HCL 5 MG PO TABS
5.0000 mg | ORAL_TABLET | ORAL | Status: DC | PRN
  Administered 2021-11-17 – 2021-11-22 (×13): 10 mg via ORAL
  Filled 2021-11-16 (×13): qty 2

## 2021-11-16 MED ORDER — LIDOCAINE HCL (PF) 1 % IJ SOLN
INTRAMUSCULAR | Status: DC | PRN
  Administered 2021-11-16: 2 mL
  Administered 2021-11-16: 15 mL

## 2021-11-16 MED ORDER — MILRINONE LACTATE IN DEXTROSE 20-5 MG/100ML-% IV SOLN
0.3000 ug/kg/min | INTRAVENOUS | Status: DC
  Filled 2021-11-16: qty 100

## 2021-11-16 MED ORDER — POTASSIUM CHLORIDE 10 MEQ/50ML IV SOLN
10.0000 meq | INTRAVENOUS | Status: AC
  Administered 2021-11-17 (×3): 10 meq via INTRAVENOUS

## 2021-11-16 MED ORDER — BISACODYL 10 MG RE SUPP: 10.0000 mg | Freq: Every day | RECTAL | Status: DC

## 2021-11-16 MED ORDER — TRANEXAMIC ACID 1000 MG/10ML IV SOLN
1.5000 mg/kg/h | INTRAVENOUS | Status: DC
  Filled 2021-11-16: qty 25

## 2021-11-16 MED ORDER — METOPROLOL TARTRATE 25 MG/10 ML ORAL SUSPENSION: 12.5000 mg | Freq: Two times a day (BID) | ORAL | Status: DC

## 2021-11-16 MED ORDER — ALBUMIN HUMAN 5 % IV SOLN
250.0000 mL | INTRAVENOUS | Status: AC | PRN
  Administered 2021-11-17 (×4): 12.5 g via INTRAVENOUS
  Filled 2021-11-16: qty 250

## 2021-11-16 MED ORDER — BISACODYL 5 MG PO TBEC
10.0000 mg | DELAYED_RELEASE_TABLET | Freq: Every day | ORAL | Status: DC
  Administered 2021-11-18 – 2021-11-19 (×2): 10 mg via ORAL
  Filled 2021-11-16 (×2): qty 2

## 2021-11-16 MED ORDER — HYDRALAZINE HCL 20 MG/ML IJ SOLN: 10.0000 mg | INTRAMUSCULAR | Status: AC | PRN

## 2021-11-16 MED ORDER — PANTOPRAZOLE SODIUM 40 MG PO TBEC
40.0000 mg | DELAYED_RELEASE_TABLET | Freq: Every day | ORAL | Status: DC
  Administered 2021-11-18 – 2021-11-23 (×6): 40 mg via ORAL
  Filled 2021-11-16 (×6): qty 1

## 2021-11-16 MED ORDER — LACTATED RINGERS IV SOLN: INTRAVENOUS | Status: DC

## 2021-11-16 MED ORDER — CHLORHEXIDINE GLUCONATE 0.12 % MT SOLN
15.0000 mL | OROMUCOSAL | Status: AC
  Administered 2021-11-17: 15 mL via OROMUCOSAL
  Filled 2021-11-16: qty 15

## 2021-11-16 MED ORDER — SUCCINYLCHOLINE CHLORIDE 200 MG/10ML IV SOSY
PREFILLED_SYRINGE | INTRAVENOUS | Status: AC
  Filled 2021-11-16: qty 10

## 2021-11-16 MED ORDER — HEPARIN SODIUM (PORCINE) 1000 UNIT/ML IJ SOLN
INTRAMUSCULAR | Status: DC | PRN
  Administered 2021-11-16: 6000 [IU] via INTRAVENOUS

## 2021-11-16 MED ORDER — VERAPAMIL HCL 2.5 MG/ML IV SOLN
INTRAVENOUS | Status: DC | PRN
  Administered 2021-11-16: 10 mL via INTRA_ARTERIAL

## 2021-11-16 MED ORDER — CLEVIDIPINE BUTYRATE 0.5 MG/ML IV EMUL
0.0000 mg/h | INTRAVENOUS | Status: DC
  Administered 2021-11-16: 2 mg/h via INTRAVENOUS
  Filled 2021-11-16: qty 50

## 2021-11-16 MED ORDER — SUCCINYLCHOLINE CHLORIDE 200 MG/10ML IV SOSY
PREFILLED_SYRINGE | INTRAVENOUS | Status: DC | PRN
  Administered 2021-11-16: 140 mg via INTRAVENOUS

## 2021-11-16 MED ORDER — VERAPAMIL HCL 2.5 MG/ML IV SOLN
INTRAVENOUS | Status: AC
  Filled 2021-11-16: qty 2

## 2021-11-16 MED ORDER — ACETAMINOPHEN 160 MG/5ML PO SOLN: 650.0000 mg | Freq: Once | ORAL | Status: AC

## 2021-11-16 MED ORDER — HEPARIN (PORCINE) IN NACL 1000-0.9 UT/500ML-% IV SOLN
INTRAVENOUS | Status: AC
  Filled 2021-11-16: qty 1000

## 2021-11-16 MED ORDER — NITROGLYCERIN IN D5W 200-5 MCG/ML-% IV SOLN: 0.0000 ug/min | INTRAVENOUS | Status: DC

## 2021-11-16 MED ORDER — CEFAZOLIN SODIUM-DEXTROSE 2-4 GM/100ML-% IV SOLN: 2.0000 g | INTRAVENOUS | Status: DC

## 2021-11-16 MED ORDER — MORPHINE SULFATE (PF) 2 MG/ML IV SOLN
INTRAVENOUS | Status: AC
  Administered 2021-11-16: 2 mg via INTRAVENOUS
  Filled 2021-11-16: qty 1

## 2021-11-16 MED ORDER — ROCURONIUM BROMIDE 10 MG/ML (PF) SYRINGE
PREFILLED_SYRINGE | INTRAVENOUS | Status: DC | PRN
  Administered 2021-11-16: 50 mg via INTRAVENOUS
  Administered 2021-11-16: 60 mg via INTRAVENOUS
  Administered 2021-11-16: 40 mg via INTRAVENOUS
  Administered 2021-11-16 (×2): 50 mg via INTRAVENOUS
  Administered 2021-11-16: 30 mg via INTRAVENOUS

## 2021-11-16 SURGICAL SUPPLY — 113 items
ADAPTER CARDIO PERF ANTE/RETRO (ADAPTER) ×1 IMPLANT
ADPR PRFSN 84XANTGRD RTRGD (ADAPTER) ×2
BAG DECANTER FOR FLEXI CONT (MISCELLANEOUS) ×3 IMPLANT
BLADE CLIPPER SURG (BLADE) ×3 IMPLANT
BLADE STERNUM SYSTEM 6 (BLADE) ×3 IMPLANT
BNDG ELASTIC 4X5.8 VLCR STR LF (GAUZE/BANDAGES/DRESSINGS) ×3 IMPLANT
BNDG ELASTIC 6X5.8 VLCR STR LF (GAUZE/BANDAGES/DRESSINGS) ×3 IMPLANT
BNDG GAUZE DERMACEA FLUFF (GAUZE/BANDAGES/DRESSINGS) ×1
BNDG GAUZE DERMACEA FLUFF 4 (GAUZE/BANDAGES/DRESSINGS) IMPLANT
BNDG GAUZE ELAST 4 BULKY (GAUZE/BANDAGES/DRESSINGS) ×3 IMPLANT
BNDG GZE DERMACEA 4 6PLY (GAUZE/BANDAGES/DRESSINGS) ×2
CANISTER SUCT 3000ML PPV (MISCELLANEOUS) ×3 IMPLANT
CANNULA GUNDRY RETROGRADE 15FR (MISCELLANEOUS) ×1 IMPLANT
CATH CPB KIT VANTRIGT (MISCELLANEOUS) ×1 IMPLANT
CATH ROBINSON RED A/P 18FR (CATHETERS) ×6 IMPLANT
CATH THORACIC 28FR (CATHETERS) ×4 IMPLANT
CATH THORACIC 36FR (CATHETERS) ×3 IMPLANT
CATH THORACIC 36FR RT ANG (CATHETERS) ×3 IMPLANT
CLIP TI WIDE RED SMALL 24 (CLIP) ×2 IMPLANT
CLIP VESOCCLUDE MED 24/CT (CLIP) IMPLANT
CLIP VESOCCLUDE SM WIDE 24/CT (CLIP) IMPLANT
CNTNR URN SCR LID CUP LEK RST (MISCELLANEOUS) IMPLANT
CONT SPEC 4OZ STRL OR WHT (MISCELLANEOUS) ×6
CONTAINER PROTECT SURGISLUSH (MISCELLANEOUS) ×6 IMPLANT
DRAPE CARDIOVASCULAR INCISE (DRAPES) ×3
DRAPE SRG 135X102X78XABS (DRAPES) ×2 IMPLANT
DRAPE WARM FLUID 44X44 (DRAPES) ×3 IMPLANT
DRSG COVADERM 4X14 (GAUZE/BANDAGES/DRESSINGS) ×3 IMPLANT
ELECT CAUTERY BLADE 6.4 (BLADE) ×3 IMPLANT
ELECT REM PT RETURN 9FT ADLT (ELECTROSURGICAL) ×6
ELECTRODE REM PT RTRN 9FT ADLT (ELECTROSURGICAL) ×4 IMPLANT
FELT TEFLON 1X6 (MISCELLANEOUS) ×6 IMPLANT
GAUZE 4X4 16PLY ~~LOC~~+RFID DBL (SPONGE) ×3 IMPLANT
GAUZE SPONGE 4X4 12PLY STRL (GAUZE/BANDAGES/DRESSINGS) ×6 IMPLANT
GAUZE SPONGE 4X4 12PLY STRL LF (GAUZE/BANDAGES/DRESSINGS) ×2 IMPLANT
GLOVE BIO SURGEON STRL SZ 6 (GLOVE) IMPLANT
GLOVE BIO SURGEON STRL SZ 6.5 (GLOVE) ×6 IMPLANT
GLOVE BIO SURGEON STRL SZ7 (GLOVE) IMPLANT
GLOVE BIO SURGEON STRL SZ7.5 (GLOVE) IMPLANT
GLOVE BIOGEL PI IND STRL 6 (GLOVE) IMPLANT
GLOVE BIOGEL PI IND STRL 6.5 (GLOVE) IMPLANT
GLOVE BIOGEL PI IND STRL 7.0 (GLOVE) IMPLANT
GLOVE BIOGEL PI IND STRL 7.5 (GLOVE) IMPLANT
GLOVE BIOGEL PI INDICATOR 6 (GLOVE) ×1
GLOVE BIOGEL PI INDICATOR 6.5 (GLOVE) ×1
GLOVE BIOGEL PI INDICATOR 7.0 (GLOVE)
GLOVE BIOGEL PI INDICATOR 7.5 (GLOVE) ×3
GLOVE ORTHO TXT STRL SZ7.5 (GLOVE) IMPLANT
GLOVE SS BIOGEL STRL SZ 6 (GLOVE) IMPLANT
GLOVE SUPERSENSE BIOGEL SZ 6 (GLOVE) ×2
GLOVE SURG MICRO LTX SZ7 (GLOVE) ×6 IMPLANT
GOWN STRL REUS W/ TWL LRG LVL3 (GOWN DISPOSABLE) ×8 IMPLANT
GOWN STRL REUS W/ TWL XL LVL3 (GOWN DISPOSABLE) ×2 IMPLANT
GOWN STRL REUS W/TWL LRG LVL3 (GOWN DISPOSABLE) ×12
GOWN STRL REUS W/TWL XL LVL3 (GOWN DISPOSABLE) ×3
HEMOSTAT POWDER SURGIFOAM 1G (HEMOSTASIS) ×9 IMPLANT
HEMOSTAT SURGICEL 2X14 (HEMOSTASIS) ×3 IMPLANT
INSERT FOGARTY 61MM (MISCELLANEOUS) IMPLANT
INSERT FOGARTY XLG (MISCELLANEOUS) ×1 IMPLANT
KIT BASIN OR (CUSTOM PROCEDURE TRAY) ×3 IMPLANT
KIT CATH CPB BARTLE (MISCELLANEOUS) ×3 IMPLANT
KIT SUCTION CATH 14FR (SUCTIONS) ×3 IMPLANT
KIT TURNOVER KIT B (KITS) ×3 IMPLANT
KIT VASOVIEW HEMOPRO 2 VH 4000 (KITS) ×3 IMPLANT
NS IRRIG 1000ML POUR BTL (IV SOLUTION) ×15 IMPLANT
PACK E OPEN HEART (SUTURE) ×3 IMPLANT
PACK OPEN HEART (CUSTOM PROCEDURE TRAY) ×3 IMPLANT
PAD ARMBOARD 7.5X6 YLW CONV (MISCELLANEOUS) ×6 IMPLANT
PAD ELECT DEFIB RADIOL ZOLL (MISCELLANEOUS) ×3 IMPLANT
PENCIL BUTTON HOLSTER BLD 10FT (ELECTRODE) ×3 IMPLANT
POSITIONER HEAD DONUT 9IN (MISCELLANEOUS) ×3 IMPLANT
PUNCH AORTIC ROTATE 4.0MM (MISCELLANEOUS) IMPLANT
PUNCH AORTIC ROTATE 4.5MM 8IN (MISCELLANEOUS) ×3 IMPLANT
PUNCH AORTIC ROTATE 5MM 8IN (MISCELLANEOUS) IMPLANT
SET MPS 3-ND DEL (MISCELLANEOUS) ×1 IMPLANT
SPONGE INTESTINAL PEANUT (DISPOSABLE) IMPLANT
SPONGE T-LAP 18X18 ~~LOC~~+RFID (SPONGE) ×14 IMPLANT
SPONGE T-LAP 4X18 ~~LOC~~+RFID (SPONGE) ×7 IMPLANT
SUPPORT HEART JANKE-BARRON (MISCELLANEOUS) ×3 IMPLANT
SUT BONE WAX W31G (SUTURE) ×3 IMPLANT
SUT MNCRL AB 4-0 PS2 18 (SUTURE) IMPLANT
SUT PROLENE 3 0 SH DA (SUTURE) IMPLANT
SUT PROLENE 3 0 SH1 36 (SUTURE) ×4 IMPLANT
SUT PROLENE 4 0 RB 1 (SUTURE)
SUT PROLENE 4 0 SH DA (SUTURE) IMPLANT
SUT PROLENE 4-0 RB1 .5 CRCL 36 (SUTURE) IMPLANT
SUT PROLENE 5 0 C 1 36 (SUTURE) IMPLANT
SUT PROLENE 6 0 C 1 30 (SUTURE) IMPLANT
SUT PROLENE 7 0 BV 1 (SUTURE) IMPLANT
SUT PROLENE 7 0 BV1 MDA (SUTURE) ×4 IMPLANT
SUT PROLENE 8 0 BV175 6 (SUTURE) ×1 IMPLANT
SUT SILK  1 MH (SUTURE) ×3
SUT SILK 1 MH (SUTURE) IMPLANT
SUT SILK 2 0 SH (SUTURE) IMPLANT
SUT STEEL STERNAL CCS#1 18IN (SUTURE) ×1 IMPLANT
SUT STEEL SZ 6 DBL 3X14 BALL (SUTURE) ×2 IMPLANT
SUT VIC AB 1 CTX 36 (SUTURE) ×9
SUT VIC AB 1 CTX36XBRD ANBCTR (SUTURE) ×4 IMPLANT
SUT VIC AB 2-0 CT1 27 (SUTURE)
SUT VIC AB 2-0 CT1 TAPERPNT 27 (SUTURE) IMPLANT
SUT VIC AB 2-0 CTX 27 (SUTURE) IMPLANT
SUT VIC AB 3-0 SH 27 (SUTURE)
SUT VIC AB 3-0 SH 27X BRD (SUTURE) IMPLANT
SUT VIC AB 3-0 X1 27 (SUTURE) IMPLANT
SUT VICRYL 4-0 PS2 18IN ABS (SUTURE) IMPLANT
SYR 20CC LL (SYRINGE) ×1 IMPLANT
SYSTEM SAHARA CHEST DRAIN ATS (WOUND CARE) ×3 IMPLANT
TOWEL GREEN STERILE (TOWEL DISPOSABLE) ×3 IMPLANT
TOWEL GREEN STERILE FF (TOWEL DISPOSABLE) ×3 IMPLANT
TRAY FOLEY SLVR 16FR TEMP STAT (SET/KITS/TRAYS/PACK) ×4 IMPLANT
TUBING LAP HI FLOW INSUFFLATIO (TUBING) ×3 IMPLANT
UNDERPAD 30X36 HEAVY ABSORB (UNDERPADS AND DIAPERS) ×3 IMPLANT
WATER STERILE IRR 1000ML POUR (IV SOLUTION) ×6 IMPLANT

## 2021-11-16 SURGICAL SUPPLY — 13 items
BALLN IABP SENSA PLUS 7.5F 40C (BALLOONS) ×2
BALLOON IABP SENS PLUS 7.5F40C (BALLOONS) IMPLANT
CATH OPTITORQUE TIG 4.0 5F (CATHETERS) ×1 IMPLANT
GLIDESHEATH SLEND SS 6F .021 (SHEATH) ×1 IMPLANT
GUIDEWIRE INQWIRE 1.5J.035X260 (WIRE) IMPLANT
INQWIRE 1.5J .035X260CM (WIRE) ×2
KIT HEART LEFT (KITS) ×2 IMPLANT
KIT MICROPUNCTURE NIT STIFF (SHEATH) ×1 IMPLANT
PACK CARDIAC CATHETERIZATION (CUSTOM PROCEDURE TRAY) ×2 IMPLANT
SHEATH PINNACLE 6F 10CM (SHEATH) ×1 IMPLANT
TRANSDUCER W/STOPCOCK (MISCELLANEOUS) ×2 IMPLANT
TUBING CIL FLEX 10 FLL-RA (TUBING) ×2 IMPLANT
WIRE MICROINTRODUCER 60CM (WIRE) ×1 IMPLANT

## 2021-11-16 NOTE — Hospital Course (Addendum)
HPI: This is a 71 year old female with a past medical history of CAD and MI, obesity, COPD, essential hypertension, hyperlipidemia, OSA, tobacco abuse, and DM who presented to [facility] for an epidural steroid injection when she experienced chest pain radiating to her throat, diaphoretic, and shortness of breath. She was placed on supplemental oxygen and EMS was summonsed. . EKG showed diffuse ST segment depression and ST elevation in aVR, suggestive of diffuse ischemia. Initial Troponin I (high sensitivity) was 331. She ruled in for a NSTEMI. She underwent a cardiac catheterization and was found to have multivessel CAD. IABP was placed. Emergent cardiothoracic consultation was obtained with Dr. Cyndia Bent for the consideration of emergent coronary artery bypass grafting surgery.  Hospital Course:  Patient underwent an emergent CABG x 3 utilizing LIMA to LAD, SVG to OM, SVG to Ramus Intermediate.  She also underwent endoscopic harvest of greater saphenous vein from her right leg.  She tolerated the procedure without difficulty and was taken to the SICU in stable condition.   She required Neo-synephrine post operatively.  This was weaned off as hemodynamics allowed.  The patient was weaned and extubated on POD #1.  She IABP was weaned and removed on 11/17/2021.  The patient was started on lopressor once weaned off Neo-synephrine drip.  She was volume overloaded and started on Lasix, potassium.  She responded very well with removal of over 3L of fluid.  Her chest tubes and arterial lines were removed without difficulty.  PT consult was obtained to assess home needs.  She will be started on Plavix after pacing wires removal for ACS on presentation.  She was maintaining NSR and felt stable for transfer the progressive care unit on 11/20/2021.  She continues to maintain NSR.  Her pacing wires were removed without difficulty.  She was evaluated by PT who recommended home health.  She was transferred from the ICU  to 4E on 06/30. She has been tolerating a diet and has had a bowel movement. All wounds are clean, dry, healing without signs of infection. She is ambulating with good oxygenation on room air. She is deconditioned. She is felt surgically stable for discharge today.

## 2021-11-16 NOTE — Progress Notes (Signed)
Pt arrived at [facility] for epidural steroid injection and was sitting in lobby when the nursing staff was notified of the patient complaining of chest pain and shortness of breath. The pt was brought back into the nursing station, Vital signs obtained, Dr. Alfredo Batty notified. Pts sister was at her bedside. Pt reported chest pain 9-10/10. Radiating to throat. Pt was placed on 2L supplemental oxygen. Pt alert and oriented. Pt requested to be sent to the emergency room via ambulance. This phone call was made and the pt was transported to the emergency room.

## 2021-11-16 NOTE — ED Triage Notes (Signed)
Pt BIB [facility] from Drs visit after reporting chest pain and becoming diaphoretic in the waiting room. EMS reports EKG changes intermittently during chest pain episodes.

## 2021-11-16 NOTE — CV Procedure (Signed)
   NAME:  Katelyn Kaufman   MRN: [id number] DOB:  Jan 06, 1951   ADMIT DATE: 11/16/2021  Brief Cardiac Catheterization Note:  Referring Cardiologist: Dr. Excell Seltzer  Indication: NSTEMI Known CAD-PCI  Procedures: LEFT HEART Catheterization with Native Coroanry Angiography via 6 French right radial artery access (US Guided) -> Sheath inserted using direct ultrasound guidance with micropuncture needle and a 6 French glide sheath -> weight-based IV heparin administered (6000 units) Sheath sutured in place and attached as arterial line post procedure LCA Cineangiography: TIG 4.0 catheter RCA Cineangiography: TIG 4.0 catheter LV Hemodynamics / LV-gram: TIG 4.0 catheter IABP insertion via 7.5 French RFA sheath Fluoroscopic and direct ultrasound-guided RFA access using 5 [place] micropuncture kit upsized to 6 [place] sheath followed by 7.5 Xience IVP sheath. IVP inserted under fluoroscopic guidance into the aortic knob and confirmed fluoroscopically. Lines aspirated and flushed-placed at one-to-one: Catheter cover advanced to the hub.  Sheath sutured in place and StatLock's placed proximal and distal on the catheter cable  Medications: 50 mg Versed IV; 2 mcg Fentanyl IV 45 mL contrast Subcu lidocaine 3 mL for radial access, 15 mL for femoral.  Impression: Severe distal LM-trifurcation LAD, RI, LCx 95-99 % eccentric stenosis with minimal downstream disease. LAD has mid and distal vessel stent with diffuse calcification.  Stents are widely patent.  LAD reaches the apex.  1 very proximal ramus like small diagonal branch followed by 2 additional diagonal branches.  Too small for bypass. Ramus or medius is a large-caliber vessel that reaches the apex with distal branches. LCx courses as of the lateral OM/LPL distally after giving rise to small to moderate-sized OM1, OM 2 and OM 4 with minuscule OM 3.  Too small for grafting. RCA has proximal eccentric 45 to 50% stenosis with minimal distal disease giving  rise to RPDA and 2 PL branches.  No significant disease in the RCA system. Preserved LVEF with mild mid to apical anterior hypokinesis. Hemodynamics:  Central AoP: 176/89 with a MAP 124 mmHg LVP/EDP: 172/5-15 mmHg  Recommendations: Urgent/emergent CVTS consultation with Dr. Laneta Simmers performed in the Cath Lab.  Plan is for emergent CABG Continue IABP via RFA Radial sheath sutured in place to be used for arterial line during procedure.  Removal in the CVICU post CABG with TR band placed by catheter supervisor.   Full note to follow  Bryan Lemma, M.D., M.S. Interventional Cardiologist   Pager # [phone] Phone # [phone] [address] [place], [place] [address]  11/16/2021 5:19 PM

## 2021-11-17 ENCOUNTER — Inpatient Hospital Stay (HOSPITAL_COMMUNITY): Payer: Medicare HMO

## 2021-11-17 ENCOUNTER — Encounter (HOSPITAL_COMMUNITY): Payer: Self-pay | Admitting: Cardiology

## 2021-11-17 ENCOUNTER — Encounter: Payer: Self-pay | Admitting: Hematology

## 2021-11-17 DIAGNOSIS — I2101 ST elevation (STEMI) myocardial infarction involving left main coronary artery: Secondary | ICD-10-CM | POA: Diagnosis not present

## 2021-11-17 LAB — POCT I-STAT 7, (LYTES, BLD GAS, ICA,H+H)
Acid-base deficit: 4 mmol/L — ABNORMAL HIGH (ref 0.0–2.0)
Bicarbonate: 21.2 mmol/L (ref 20.0–28.0)
Calcium, Ion: 1.24 mmol/L (ref 1.15–1.40)
HCT: 30 % — ABNORMAL LOW (ref 36.0–46.0)
Hemoglobin: 10.2 g/dL — ABNORMAL LOW (ref 12.0–15.0)
O2 Saturation: 92 %
Patient temperature: 36
Potassium: 3.5 mmol/L (ref 3.5–5.1)
Sodium: 138 mmol/L (ref 135–145)
TCO2: 22 mmol/L (ref 22–32)
pCO2 arterial: 37.5 mmHg (ref 32–48)
pH, Arterial: 7.355 (ref 7.35–7.45)
pO2, Arterial: 64 mmHg — ABNORMAL LOW (ref 83–108)

## 2021-11-17 LAB — CBC
HCT: 27.5 % — ABNORMAL LOW (ref 36.0–46.0)
HCT: 27.7 % — ABNORMAL LOW (ref 36.0–46.0)
HCT: 30.9 % — ABNORMAL LOW (ref 36.0–46.0)
Hemoglobin: 10.9 g/dL — ABNORMAL LOW (ref 12.0–15.0)
Hemoglobin: 9.2 g/dL — ABNORMAL LOW (ref 12.0–15.0)
Hemoglobin: 9.7 g/dL — ABNORMAL LOW (ref 12.0–15.0)
MCH: 30.1 pg (ref 26.0–34.0)
MCH: 31 pg (ref 26.0–34.0)
MCH: 31.4 pg (ref 26.0–34.0)
MCHC: 33.5 g/dL (ref 30.0–36.0)
MCHC: 35 g/dL (ref 30.0–36.0)
MCHC: 35.3 g/dL (ref 30.0–36.0)
MCV: 87.8 fL (ref 80.0–100.0)
MCV: 89.6 fL (ref 80.0–100.0)
MCV: 89.9 fL (ref 80.0–100.0)
Platelets: 81 10*3/uL — ABNORMAL LOW (ref 150–400)
Platelets: 92 10*3/uL — ABNORMAL LOW (ref 150–400)
Platelets: DECREASED 10*3/uL (ref 150–400)
RBC: 3.06 MIL/uL — ABNORMAL LOW (ref 3.87–5.11)
RBC: 3.09 MIL/uL — ABNORMAL LOW (ref 3.87–5.11)
RBC: 3.52 MIL/uL — ABNORMAL LOW (ref 3.87–5.11)
RDW: 13.9 % (ref 11.5–15.5)
RDW: 14.6 % (ref 11.5–15.5)
RDW: 15 % (ref 11.5–15.5)
WBC: 4.8 10*3/uL (ref 4.0–10.5)
WBC: 6.3 10*3/uL (ref 4.0–10.5)
WBC: 7.4 10*3/uL (ref 4.0–10.5)
nRBC: 0 % (ref 0.0–0.2)
nRBC: 0 % (ref 0.0–0.2)
nRBC: 0 % (ref 0.0–0.2)

## 2021-11-17 LAB — POCT I-STAT, CHEM 8
BUN: 10 mg/dL (ref 8–23)
BUN: 7 mg/dL — ABNORMAL LOW (ref 8–23)
Calcium, Ion: 1.41 mmol/L — ABNORMAL HIGH (ref 1.15–1.40)
Calcium, Ion: 1.27 mmol/L (ref 1.15–1.40)
Chloride: 103 mmol/L (ref 98–111)
Chloride: 106 mmol/L (ref 98–111)
Creatinine, Ser: 0.9 mg/dL (ref 0.44–1.00)
Creatinine, Ser: 0.8 mg/dL (ref 0.44–1.00)
Glucose, Bld: 177 mg/dL — ABNORMAL HIGH (ref 70–99)
Glucose, Bld: 115 mg/dL — ABNORMAL HIGH (ref 70–99)
HCT: 31 % — ABNORMAL LOW (ref 36.0–46.0)
HCT: 25 % — ABNORMAL LOW (ref 36.0–46.0)
Hemoglobin: 10.5 g/dL — ABNORMAL LOW (ref 12.0–15.0)
Hemoglobin: 8.5 g/dL — ABNORMAL LOW (ref 12.0–15.0)
Potassium: 3.8 mmol/L (ref 3.5–5.1)
Potassium: 5.2 mmol/L — ABNORMAL HIGH (ref 3.5–5.1)
Sodium: 136 mmol/L (ref 135–145)
Sodium: 135 mmol/L (ref 135–145)
TCO2: 26 mmol/L (ref 22–32)
TCO2: 23 mmol/L (ref 22–32)

## 2021-11-17 LAB — POCT I-STAT EG7
Acid-base deficit: 4 mmol/L — ABNORMAL HIGH (ref 0.0–2.0)
Acid-base deficit: 5 mmol/L — ABNORMAL HIGH (ref 0.0–2.0)
Acid-base deficit: 4 mmol/L — ABNORMAL HIGH (ref 0.0–2.0)
Bicarbonate: 22.3 mmol/L (ref 20.0–28.0)
Bicarbonate: 21.3 mmol/L (ref 20.0–28.0)
Bicarbonate: 22.3 mmol/L (ref 20.0–28.0)
Calcium, Ion: 1.34 mmol/L (ref 1.15–1.40)
Calcium, Ion: 1.3 mmol/L (ref 1.15–1.40)
Calcium, Ion: 1.36 mmol/L (ref 1.15–1.40)
HCT: 25 % — ABNORMAL LOW (ref 36.0–46.0)
HCT: 24 % — ABNORMAL LOW (ref 36.0–46.0)
HCT: 26 % — ABNORMAL LOW (ref 36.0–46.0)
Hemoglobin: 8.5 g/dL — ABNORMAL LOW (ref 12.0–15.0)
Hemoglobin: 8.2 g/dL — ABNORMAL LOW (ref 12.0–15.0)
Hemoglobin: 8.8 g/dL — ABNORMAL LOW (ref 12.0–15.0)
O2 Saturation: 70 %
O2 Saturation: 58 %
O2 Saturation: 60 %
Patient temperature: 37.2
Patient temperature: 37.1
Patient temperature: 37.2
Potassium: 4.7 mmol/L (ref 3.5–5.1)
Potassium: 4.5 mmol/L (ref 3.5–5.1)
Potassium: 4.7 mmol/L (ref 3.5–5.1)
Sodium: 137 mmol/L (ref 135–145)
Sodium: 137 mmol/L (ref 135–145)
Sodium: 136 mmol/L (ref 135–145)
TCO2: 24 mmol/L (ref 22–32)
TCO2: 23 mmol/L (ref 22–32)
TCO2: 24 mmol/L (ref 22–32)
pCO2, Ven: 46.4 mmHg (ref 44–60)
pCO2, Ven: 44.1 mmHg (ref 44–60)
pCO2, Ven: 48.1 mmHg (ref 44–60)
pH, Ven: 7.292 (ref 7.25–7.43)
pH, Ven: 7.291 (ref 7.25–7.43)
pH, Ven: 7.276 (ref 7.25–7.43)
pO2, Ven: 41 mmHg (ref 32–45)
pO2, Ven: 34 mmHg (ref 32–45)
pO2, Ven: 36 mmHg (ref 32–45)

## 2021-11-17 LAB — BASIC METABOLIC PANEL
Anion gap: 7 (ref 5–15)
Anion gap: 6 (ref 5–15)
BUN: 7 mg/dL — ABNORMAL LOW (ref 8–23)
BUN: 9 mg/dL (ref 8–23)
CO2: 24 mmol/L (ref 22–32)
CO2: 25 mmol/L (ref 22–32)
Calcium: 8.5 mg/dL — ABNORMAL LOW (ref 8.9–10.3)
Calcium: 8.8 mg/dL — ABNORMAL LOW (ref 8.9–10.3)
Chloride: 106 mmol/L (ref 98–111)
Chloride: 107 mmol/L (ref 98–111)
Creatinine, Ser: 1.15 mg/dL — ABNORMAL HIGH (ref 0.44–1.00)
Creatinine, Ser: 1.06 mg/dL — ABNORMAL HIGH (ref 0.44–1.00)
GFR, Estimated: 51 mL/min — ABNORMAL LOW (ref >60)
GFR, Estimated: 57 mL/min — ABNORMAL LOW (ref >60)
Glucose, Bld: 154 mg/dL — ABNORMAL HIGH (ref 70–99)
Glucose, Bld: 168 mg/dL — ABNORMAL HIGH (ref 70–99)
Potassium: 3.7 mmol/L (ref 3.5–5.1)
Potassium: 4.4 mmol/L (ref 3.5–5.1)
Sodium: 137 mmol/L (ref 135–145)
Sodium: 138 mmol/L (ref 135–145)

## 2021-11-17 LAB — PROTIME-INR
INR: 1.7 — ABNORMAL HIGH (ref 0.8–1.2)
Prothrombin Time: 19.4 seconds — ABNORMAL HIGH (ref 11.4–15.2)

## 2021-11-17 LAB — MRSA NEXT GEN BY PCR, NASAL: MRSA by PCR Next Gen: DETECTED — AB

## 2021-11-17 LAB — GLUCOSE, CAPILLARY
Glucose-Capillary: 119 mg/dL — ABNORMAL HIGH (ref 70–99)
Glucose-Capillary: 120 mg/dL — ABNORMAL HIGH (ref 70–99)
Glucose-Capillary: 122 mg/dL — ABNORMAL HIGH (ref 70–99)
Glucose-Capillary: 125 mg/dL — ABNORMAL HIGH (ref 70–99)
Glucose-Capillary: 130 mg/dL — ABNORMAL HIGH (ref 70–99)
Glucose-Capillary: 131 mg/dL — ABNORMAL HIGH (ref 70–99)
Glucose-Capillary: 139 mg/dL — ABNORMAL HIGH (ref 70–99)
Glucose-Capillary: 148 mg/dL — ABNORMAL HIGH (ref 70–99)
Glucose-Capillary: 148 mg/dL — ABNORMAL HIGH (ref 70–99)
Glucose-Capillary: 164 mg/dL — ABNORMAL HIGH (ref 70–99)
Glucose-Capillary: 171 mg/dL — ABNORMAL HIGH (ref 70–99)
Glucose-Capillary: 177 mg/dL — ABNORMAL HIGH (ref 70–99)
Glucose-Capillary: 179 mg/dL — ABNORMAL HIGH (ref 70–99)
Glucose-Capillary: 71 mg/dL (ref 70–99)

## 2021-11-17 LAB — MAGNESIUM
Magnesium: 2.4 mg/dL (ref 1.7–2.4)
Magnesium: 2.6 mg/dL — ABNORMAL HIGH (ref 1.7–2.4)

## 2021-11-17 LAB — POCT ACTIVATED CLOTTING TIME: Activated Clotting Time: 119 seconds

## 2021-11-17 LAB — APTT: aPTT: 35 seconds (ref 24–36)

## 2021-11-17 MED ORDER — CHLORHEXIDINE GLUCONATE 0.12 % MT SOLN: 15.0000 mL | Freq: Once | OROMUCOSAL | Status: AC

## 2021-11-17 MED ORDER — SODIUM CHLORIDE 0.9% FLUSH: 10.0000 mL | INTRAVENOUS | Status: DC | PRN

## 2021-11-17 MED ORDER — PREGABALIN 75 MG PO CAPS: 75.0000 mg | ORAL_CAPSULE | Freq: Every day | ORAL | Status: DC

## 2021-11-17 MED ORDER — SODIUM BICARBONATE 8.4 % IV SOLN
50.0000 meq | Freq: Once | INTRAVENOUS | Status: AC
  Administered 2021-11-17: 50 meq via INTRAVENOUS

## 2021-11-17 MED ORDER — SODIUM CHLORIDE 0.9% FLUSH
10.0000 mL | Freq: Two times a day (BID) | INTRAVENOUS | Status: DC
  Administered 2021-11-17 – 2021-11-18 (×3): 10 mL
  Administered 2021-11-18: 20 mL
  Administered 2021-11-19: 10 mL

## 2021-11-17 MED ORDER — ORAL CARE MOUTH RINSE: 15.0000 mL | OROMUCOSAL | Status: DC | PRN

## 2021-11-17 MED ORDER — INSULIN ASPART 100 UNIT/ML IJ SOLN
0.0000 [IU] | INTRAMUSCULAR | Status: DC
  Administered 2021-11-17 (×2): 4 [IU] via SUBCUTANEOUS
  Administered 2021-11-17 (×2): 3 [IU] via SUBCUTANEOUS
  Administered 2021-11-18: 4 [IU] via SUBCUTANEOUS

## 2021-11-17 MED ORDER — MUPIROCIN 2 % EX OINT
1.0000 | TOPICAL_OINTMENT | Freq: Two times a day (BID) | CUTANEOUS | Status: AC
  Administered 2021-11-17 – 2021-11-21 (×10): 1 via NASAL
  Filled 2021-11-17 (×2): qty 22

## 2021-11-17 MED ORDER — PREGABALIN 75 MG PO CAPS
150.0000 mg | ORAL_CAPSULE | Freq: Every day | ORAL | Status: DC
  Administered 2021-11-17 – 2021-11-23 (×7): 150 mg
  Filled 2021-11-17 (×7): qty 2

## 2021-11-17 MED ORDER — CHLORHEXIDINE GLUCONATE CLOTH 2 % EX PADS
6.0000 | MEDICATED_PAD | Freq: Every day | CUTANEOUS | Status: DC
  Administered 2021-11-17 – 2021-11-19 (×3): 6 via TOPICAL

## 2021-11-17 MED ORDER — CLEVIDIPINE BUTYRATE 0.5 MG/ML IV EMUL
0.0000 mg/h | INTRAVENOUS | Status: DC
  Administered 2021-11-17: 5 mg/h via INTRAVENOUS
  Filled 2021-11-17: qty 50

## 2021-11-17 MED ORDER — ORAL CARE MOUTH RINSE
15.0000 mL | OROMUCOSAL | Status: DC
Start: 2021-11-17 — End: 2021-11-17
  Administered 2021-11-17 (×6): 15 mL via OROMUCOSAL

## 2021-11-17 MED ORDER — INSULIN DETEMIR 100 UNIT/ML ~~LOC~~ SOLN
30.0000 [IU] | Freq: Two times a day (BID) | SUBCUTANEOUS | Status: DC
  Administered 2021-11-17 – 2021-11-18 (×4): 30 [IU] via SUBCUTANEOUS
  Filled 2021-11-17 (×6): qty 0.3

## 2021-11-17 MED ORDER — CHLORHEXIDINE GLUCONATE CLOTH 2 % EX PADS
6.0000 | MEDICATED_PAD | Freq: Every day | CUTANEOUS | Status: AC
  Administered 2021-11-17 – 2021-11-20 (×3): 6 via TOPICAL

## 2021-11-17 MED ORDER — SODIUM CHLORIDE 0.9 % IV SOLN: INTRAVENOUS | Status: DC | PRN

## 2021-11-17 MED ORDER — PREGABALIN 75 MG PO CAPS
75.0000 mg | ORAL_CAPSULE | Freq: Every day | ORAL | Status: DC
  Administered 2021-11-17 – 2021-11-22 (×6): 75 mg
  Filled 2021-11-17 (×6): qty 1

## 2021-11-17 NOTE — Progress Notes (Signed)
Balloon pump aspirated and removed at 0813. Distal pulses present with doppler. Manual pressure held for . Tegaderm & guaze placed over site. No hematoma post removal, site level 0. Distal pulses checked again and present with doppler. Bedrest instructions given to patient & RN at bedside although patient intubated & sedated.    Bedrest began at 0840.

## 2021-11-17 NOTE — Progress Notes (Signed)
Orthopedic Tech Progress Note Patient Details:  Katelyn Kaufman 1950/09/15 [id number]  Ortho Devices Type of Ortho Device: Knee Immobilizer Ortho Device/Splint Interventions: Ordered  Delivered to rn.    Trinna Post 11/17/2021, 1:11 AM

## 2021-11-17 NOTE — Discharge Summary (Signed)
[address]       [place] [address]             [id number]    Physician Discharge Summary  Patient ID: Katelyn Kaufman MRN: [id number] DOB/AGE: [birth year] 71 y.o.  Admit date: 11/16/2021 Discharge date: 11/23/2021  Admission Diagnoses:  Patient Active Problem List   Diagnosis Date Noted   Type 2 diabetes mellitus with complication, with long-term current use of insulin ([place]) 11/16/2021   Hypertension 11/16/2021   Hyperlipidemia 11/16/2021   Morbid obesity ([place]) 11/16/2021   Acute myocardial infarction involving left main coronary artery ([place]) 11/16/2021   Coronary artery disease 11/16/2021   Non-ST elevation (NSTEMI) myocardial infarction [facility])    Discharge Diagnoses:  Patient Active Problem List   Diagnosis Date Noted   Acute coronary syndrome ([place])    Type 2 diabetes mellitus with complication, with long-term current use of insulin ([place]) 11/16/2021   Hypertension 11/16/2021   Hyperlipidemia 11/16/2021   Morbid obesity ([place]) 11/16/2021   Acute myocardial infarction involving left main coronary artery ([place]) 11/16/2021   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 She was placed on supplemental oxygen and EMS was summonsed. . EKG showed diffuse ST segment depression and ST elevation in aVR, suggestive of diffuse ischemia. Initial Troponin I (high sensitivity) was 331. She ruled in for a NSTEMI. She underwent a cardiac catheterization and was found to have multivessel CAD. IABP was placed. Emergent cardiothoracic consultation was  obtained with Dr. Cyndia Bent for the consideration of emergent coronary artery bypass grafting surgery.  Hospital Course:  Patient underwent an emergent CABG x 3 utilizing LIMA to LAD, SVG to OM, SVG to Ramus Intermediate.  She also underwent endoscopic harvest of greater saphenous vein from her right leg.  She tolerated the procedure without difficulty and was taken to the SICU in stable condition.   She required Neo-synephrine post operatively.  This was weaned off as hemodynamics allowed.  The patient was weaned and extubated on POD #1.  She IABP was weaned and removed on 11/17/2021.  The patient was started on lopressor once weaned off Neo-synephrine drip.  She was volume overloaded and started on Lasix, potassium.  She responded very well with removal of over 3L of fluid.  Her chest tubes and arterial lines were removed without difficulty.  PT consult was obtained to assess home needs.  She will be started on Plavix after pacing wires removal for ACS on presentation.  She was maintaining NSR and felt stable for transfer the progressive care unit on 11/20/2021.  She continues to maintain NSR.  Her pacing wires were removed without difficulty.   She was transferred from the ICU to 4E on 06/30. She has been tolerating a diet and has had a bowel movement. All wounds are clean, dry, healing without signs of infection. She is ambulating with good oxygenation on room air. She is deconditioned and home PT has been arranged. Sutures will be removed in the office after discharge. She is felt surgically stable for discharge today.  Consults: None  Significant Diagnostic  Studies: angiography:     Mid LM to Prox LAD lesion is 95% stenosed with 99% stenosed side branch in Ost Cx.  Ramus lesion is 95% stenosed. - Trifucation lesion   Mid LAD stent is 5% stenosed.  Dist LAD stent is 10% stenosed.   Prox RCA lesion is 45% stenosed.   The left ventricular systolic function is normal.  The left ventricular ejection  fraction is 50-55% by visual estimate.   There is no aortic valve stenosis.   ----------------------- Severe distal LM-trifurcation LAD, RI, LCx 95-99 % eccentric stenosis with minimal downstream disease. LAD has mid and distal vessel stent with diffuse calcification.  Stents are widely patent.  LAD reaches the apex.  1 very proximal ramus like small diagonal branch followed by 2 additional diagonal branches.  Too small for bypass. Ramus or medius is a large-caliber vessel that reaches the apex with distal branches. LCx courses as of the lateral OM/LPL distally after giving rise to small to moderate-sized OM1, OM 2 and OM 4 with minuscule OM 3.  Too small for grafting. RCA has proximal eccentric 45 to 50% stenosis with minimal distal disease giving rise to RPDA and 2 PL branches.  No significant disease in the RCA system. Preserved LVEF with mild mid to apical anterior hypokinesis. Hemodynamics:  Central AoP: 176/89 with a MAP 124 mmHg LVP/EDP: 172/5-15 mmHg   Recommendations: Urgent/emergent CVTS consultation with Dr. Cyndia Bent performed in the Cath Lab.  Plan is for emergent CABG Continue IABP via RFA Radial sheath sutured in place to be used for arterial line during procedure.  Removal in the CVICU post CABG with TR band placed by catheter supervisor.     Glenetta Hew, M.D., M.S. Interventional Cardiologist    Treatments: surgery:   11/17/2021   Surgeon:  Gaye Pollack, MD   First Assistant: Lars Pinks,  PA-C:   An experienced assistant was required given the complexity of this surgery and the standard of surgical care. The assistant was needed for endoscopic vein harvest, exposure, dissection, suctioning, retraction of delicate tissues and sutures, instrument exchange and for overall help during this procedure.     Preoperative Diagnosis:  Severe left main coronary artery disease s/p NSTEMI.     Postoperative Diagnosis:  Same     Procedure: Emergent from cath lab    Median Sternotomy Extracorporeal circulation 3.   Coronary artery bypass grafting x 3   Left internal mammary artery graft to the LAD SVG to Ramus SVG to OM   4.   Endoscopic vein harvest from the right leg     Discharge Exam: Blood pressure (!) 138/92, pulse 86, temperature 98 F (36.7 C), temperature source Oral, resp. rate 18, height '5\' 4"'$  (1.626 m), weight 110.3 kg, SpO2 96 %. Cardiovascular: RRR. Pulmonary: Mostly clear Abdomen: Soft, non tender, bowel sounds present. Extremities: Trace bilateral lower extremity edema. Wounds: Clean and dry.  No erythema or signs of infection.   Discharge Medications:  The patient has been discharged on:   1.Beta Blocker:  Yes [ x  ]                              No   [   ]                              If No, reason:  2.Ace Inhibitor/ARB: Yes [ x  ]  No  [    ]                                     If No, reason:  3.Statin:   Yes [ x  ]                  No  [   ]                  If No, reason:  4.Ecasa:  Yes  [ x  ]                  No   [   ]                  If No, reason:  Patient had ACS upon admission:YES  Plavix/P2Y12 inhibitor: Yes [  x ]                                      No  [   ]      Allergies as of 11/23/2021       Reactions   Latex Rash        Medication List     STOP taking these medications    carvedilol 25 MG tablet Commonly known as: COREG   furosemide 40 MG tablet Commonly known as: Lasix   isosorbide mononitrate 30 MG 24 hr tablet Commonly known as: IMDUR   isosorbide mononitrate 60 MG 24 hr tablet Commonly known as: IMDUR       TAKE these medications    aspirin EC 81 MG tablet Take 1 tablet (81 mg total) by mouth daily. Swallow whole.   Calcium-Vitamin D3 600-400 MG-UNIT Tabs Take 1 capsule by mouth daily.   cholecalciferol 25 MCG (1000 UNIT) tablet Commonly known as: VITAMIN D3 Take 1,000 Units by mouth daily.   clopidogrel 75 MG  tablet Commonly known as: PLAVIX TAKE 1 TABLET BY MOUTH EVERY DAY   cyclobenzaprine 10 MG tablet Commonly known as: FLEXERIL Take 10 mg by mouth 2 (two) times daily as needed for muscle spasms.   Dialyvite Omega-3 Concentrate 600 MG Caps Take 600 mg by mouth daily.   DULoxetine 60 MG capsule Commonly known as: CYMBALTA Take 60 mg by mouth daily.   ezetimibe 10 MG tablet Commonly known as: ZETIA Take 10 mg by mouth daily.   glimepiride 4 MG tablet Commonly known as: AMARYL Take 4 mg by mouth daily with breakfast.   hydrOXYzine 25 MG tablet Commonly known as: ATARAX Take 25 mg by mouth in the morning and at bedtime.   insulin glargine 100 UNIT/ML injection Commonly known as: LANTUS Inject 40 Units into the skin at bedtime.   losartan 25 MG tablet Commonly known as: COZAAR Take 1 tablet (25 mg total) by mouth daily. What changed:  medication strength how much to take when to take this   metFORMIN 500 MG tablet Commonly known as: GLUCOPHAGE Take 500-1,000 mg by mouth 2 (two) times daily with a meal. Take 2 tablets (1000 mg) in the morning and Take 1 tablets (500 mg) in the evening   metoprolol tartrate 25 MG tablet Commonly known as: LOPRESSOR Take 1 tablet (25 mg total) by mouth 2 (two) times daily.   mometasone 0.1 % ointment Commonly known  asLynne Leader Apply 1 Application topically daily.   nitroGLYCERIN 0.4 MG SL tablet Commonly known as: NITROSTAT Place 0.4 mg under the tongue every 5 (five) minutes as needed for chest pain.   nitroGLYCERIN 0.4 MG SL tablet Commonly known as: NITROSTAT Place 1 tablet (0.4 mg total) under the tongue every 5 (five) minutes as needed for chest pain.   oxyCODONE-acetaminophen 10-325 MG tablet Commonly known as: PERCOCET Take 1 tablet by mouth every 4 (four) hours as needed for pain.   pregabalin 75 MG capsule Commonly known as: LYRICA Take 75-150 mg by mouth 2 (two) times daily. Take 2 tablets (150 mg) in the morning & Take  1 tablet (75 mg) at bedtime   rosuvastatin 10 MG tablet Commonly known as: CRESTOR Take 10 mg by mouth daily.   triamcinolone ointment 0.1 % Commonly known as: KENALOG Apply 1 Application topically daily as needed (For rash).   vitamin B-12 1000 MCG tablet Commonly known as: CYANOCOBALAMIN Take 1,000 mcg by mouth daily.   vitamin C 1000 MG tablet Take 1,000 mg by mouth daily.        Follow-up Information     [facility] [place] Follow up on 11/26/2021.   Specialty: Cardiothoracic Surgery Why: Appointment is at 11:00, For suture removal Contact information: [employer], [place] [phone]        [facility] Follow up on 12/16/2021.   Specialty: Cardiothoracic Surgery Why: Appointment is at 2:00, please get CXR at 1:30 at [place] located on first floor of our office building Contact information: [place], [place], [place], PA-C Follow up on 12/02/2021.   Specialty: Cardiology Why: Appointment is at 10:15 Contact information: [address] [place]  [address] [phone]                 Signed:  Nani Skillern, PA-C  11/23/2021, 7:13 AM

## 2021-11-17 NOTE — TOC Initial Note (Signed)
Transition of Care [facility]) - Initial/Assessment Note    Patient Details  Name: Katelyn Kaufman MRN: [id number] Date of Birth: 26-Sep-1950  Transition of [facility]) CM/SW Contact:    Durenda Guthrie, RN Phone Number: 11/17/2021, 4:36 PM  Clinical Narrative:                 Transition of Care Screening Note:  Transition of Care Department [facility]) has reviewed patient and no TOC needs have been identified at this time. We will continue to monitor patient advancement through Interdisciplinary progressions. If new patient transition needs arise, please place a consult.          Patient Goals and CMS Choice        Expected Discharge Plan and Services                                                Prior Living Arrangements/Services                       Activities of Daily Living      Permission Sought/Granted                  Emotional Assessment              Admission diagnosis:  Acute myocardial infarction involving left main coronary artery (HCC) [I21.01] Coronary artery disease [I25.10] Patient Active Problem List   Diagnosis Date Noted   Type 2 diabetes mellitus with complication, with long-term current use of insulin (HCC) 11/16/2021   Hypertension 11/16/2021   Hyperlipidemia 11/16/2021   Morbid obesity (HCC) 11/16/2021   Acute myocardial infarction involving left main coronary artery (HCC) 11/16/2021   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   Coronary artery disease 11/16/2021   Non-ST elevation (NSTEMI) myocardial infarction [facility])    PCP:  Suzan Slick, MD Pharmacy:   [facility] [place], [place] - [address] [place] [address] Phone: [phone] Fax: [phone]  [facility] [phone] - [place], [place] - [address] AT [facility] [address] [place] [address] Phone: [phone] Fax: [phone]     Social Determinants of Health (SDOH) Interventions     Readmission Risk Interventions     No data to display

## 2021-11-18 DIAGNOSIS — I2101 ST elevation (STEMI) myocardial infarction involving left main coronary artery: Secondary | ICD-10-CM | POA: Diagnosis not present

## 2021-11-18 LAB — BASIC METABOLIC PANEL
Anion gap: 7 (ref 5–15)
BUN: 9 mg/dL (ref 8–23)
CO2: 24 mmol/L (ref 22–32)
Calcium: 9.5 mg/dL (ref 8.9–10.3)
Chloride: 104 mmol/L (ref 98–111)
Creatinine, Ser: 1.06 mg/dL — ABNORMAL HIGH (ref 0.44–1.00)
GFR, Estimated: 57 mL/min — ABNORMAL LOW (ref >60)
Glucose, Bld: 146 mg/dL — ABNORMAL HIGH (ref 70–99)
Potassium: 4.1 mmol/L (ref 3.5–5.1)
Sodium: 135 mmol/L (ref 135–145)

## 2021-11-18 LAB — CBC
HCT: 31.9 % — ABNORMAL LOW (ref 36.0–46.0)
Hemoglobin: 10.7 g/dL — ABNORMAL LOW (ref 12.0–15.0)
MCH: 30.3 pg (ref 26.0–34.0)
MCHC: 33.5 g/dL (ref 30.0–36.0)
MCV: 90.4 fL (ref 80.0–100.0)
Platelets: 104 10*3/uL — ABNORMAL LOW (ref 150–400)
RBC: 3.53 MIL/uL — ABNORMAL LOW (ref 3.87–5.11)
RDW: 15.1 % (ref 11.5–15.5)
WBC: 11.5 10*3/uL — ABNORMAL HIGH (ref 4.0–10.5)
nRBC: 0 % (ref 0.0–0.2)

## 2021-11-18 LAB — GLUCOSE, CAPILLARY
Glucose-Capillary: 152 mg/dL — ABNORMAL HIGH (ref 70–99)
Glucose-Capillary: 183 mg/dL — ABNORMAL HIGH (ref 70–99)
Glucose-Capillary: 184 mg/dL — ABNORMAL HIGH (ref 70–99)
Glucose-Capillary: 186 mg/dL — ABNORMAL HIGH (ref 70–99)
Glucose-Capillary: 190 mg/dL — ABNORMAL HIGH (ref 70–99)
Glucose-Capillary: 222 mg/dL — ABNORMAL HIGH (ref 70–99)

## 2021-11-18 LAB — HEMOGLOBIN A1C
Hgb A1c MFr Bld: 8.9 % — ABNORMAL HIGH (ref 4.8–5.6)
Mean Plasma Glucose: 209 mg/dL

## 2021-11-18 LAB — LIPOPROTEIN A (LPA): Lipoprotein (a): 36 nmol/L — ABNORMAL HIGH (ref <75.0)

## 2021-11-18 MED ORDER — POTASSIUM CHLORIDE CRYS ER 20 MEQ PO TBCR
20.0000 meq | EXTENDED_RELEASE_TABLET | Freq: Three times a day (TID) | ORAL | Status: AC
  Administered 2021-11-18 (×3): 20 meq via ORAL
  Filled 2021-11-18 (×3): qty 1

## 2021-11-18 MED ORDER — INSULIN ASPART 100 UNIT/ML IJ SOLN
0.0000 [IU] | Freq: Three times a day (TID) | INTRAMUSCULAR | Status: DC
  Administered 2021-11-18: 7 [IU] via SUBCUTANEOUS
  Administered 2021-11-18: 4 [IU] via SUBCUTANEOUS
  Administered 2021-11-19 (×3): 7 [IU] via SUBCUTANEOUS
  Administered 2021-11-20 (×2): 4 [IU] via SUBCUTANEOUS
  Administered 2021-11-20 – 2021-11-21 (×2): 3 [IU] via SUBCUTANEOUS
  Administered 2021-11-21: 7 [IU] via SUBCUTANEOUS
  Administered 2021-11-22: 4 [IU] via SUBCUTANEOUS
  Administered 2021-11-23: 3 [IU] via SUBCUTANEOUS

## 2021-11-18 MED ORDER — ENOXAPARIN SODIUM 40 MG/0.4ML IJ SOSY
40.0000 mg | PREFILLED_SYRINGE | Freq: Every day | INTRAMUSCULAR | Status: DC
Start: 2021-11-18 — End: 2021-11-23
  Administered 2021-11-18 – 2021-11-22 (×5): 40 mg via SUBCUTANEOUS
  Filled 2021-11-18 (×5): qty 0.4

## 2021-11-18 MED ORDER — FUROSEMIDE 10 MG/ML IJ SOLN
40.0000 mg | Freq: Two times a day (BID) | INTRAMUSCULAR | Status: AC
  Administered 2021-11-18 (×2): 40 mg via INTRAVENOUS
  Filled 2021-11-18 (×2): qty 4

## 2021-11-18 MED ORDER — METOLAZONE 2.5 MG PO TABS
2.5000 mg | ORAL_TABLET | Freq: Once | ORAL | Status: AC
  Administered 2021-11-18: 2.5 mg via ORAL
  Filled 2021-11-18: qty 1

## 2021-11-18 MED FILL — Sodium Bicarbonate IV Soln 8.4%: INTRAVENOUS | Qty: 50 | Status: AC

## 2021-11-18 MED FILL — Potassium Chloride Inj 2 mEq/ML: INTRAVENOUS | Qty: 40 | Status: AC

## 2021-11-18 MED FILL — Magnesium Sulfate Inj 50%: INTRAMUSCULAR | Qty: 10 | Status: AC

## 2021-11-18 MED FILL — Sodium Chloride IV Soln 0.9%: INTRAVENOUS | Qty: 2000 | Status: AC

## 2021-11-18 MED FILL — Mannitol IV Soln 20%: INTRAVENOUS | Qty: 500 | Status: AC

## 2021-11-18 MED FILL — Lidocaine HCl Local Soln Prefilled Syringe 100 MG/5ML (2%): INTRAMUSCULAR | Qty: 5 | Status: AC

## 2021-11-18 MED FILL — Heparin Sodium (Porcine) Inj 1000 Unit/ML: Qty: 1000 | Status: AC

## 2021-11-18 MED FILL — Albumin, Human Inj 5%: INTRAVENOUS | Qty: 250 | Status: AC

## 2021-11-18 MED FILL — Electrolyte-R (PH 7.4) Solution: INTRAVENOUS | Qty: 3000 | Status: AC

## 2021-11-18 NOTE — Progress Notes (Signed)
Progress Note  Patient Name: Katelyn Kaufman Date of Encounter: 11/18/2021  [facility] Cardiologist: Rozann Lesches, MD   Subjective   Sore all over, no specific complaints  Inpatient Medications    Scheduled Meds:  acetaminophen  1,000 mg Oral Q6H   Or   acetaminophen (TYLENOL) oral liquid 160 mg/5 mL  1,000 mg Per Tube Q6H   aspirin EC  325 mg Oral Daily   Or   aspirin  324 mg Per Tube Daily   bisacodyl  10 mg Oral Daily   Or   bisacodyl  10 mg Rectal Daily   Chlorhexidine Gluconate Cloth  6 each Topical Daily   Chlorhexidine Gluconate Cloth  6 each Topical Q0600   docusate  200 mg Oral Daily   DULoxetine  60 mg Oral Daily   enoxaparin (LOVENOX) injection  40 mg Subcutaneous QHS   ezetimibe  10 mg Oral Daily   furosemide  40 mg Intravenous BID   insulin aspart  0-20 Units Subcutaneous TID WC   insulin detemir  30 Units Subcutaneous BID   metolazone  2.5 mg Oral Once   metoprolol tartrate  12.5 mg Oral BID   Or   metoprolol tartrate  12.5 mg Per Tube BID   mupirocin ointment  1 Application Nasal BID   pantoprazole  40 mg Oral Daily   potassium chloride  20 mEq Oral TID   pregabalin  150 mg Per Tube Q0600   pregabalin  75 mg Per Tube QHS   rosuvastatin  10 mg Oral Daily   sodium chloride flush  10-40 mL Intracatheter Q12H   sodium chloride flush  3 mL Intravenous Q12H   sodium chloride flush  3 mL Intravenous Q12H   Continuous Infusions:  sodium chloride     sodium chloride     sodium chloride     sodium chloride 10 mL/hr at 11/18/21 0600   sodium chloride Stopped (11/18/21 0543)    ceFAZolin (ANCEF) IV Stopped (11/18/21 0538)   lactated ringers 20 mL/hr at 11/18/21 0600   lactated ringers     PRN Meds: sodium chloride, sodium chloride, Place/Maintain arterial line **AND** sodium chloride, acetaminophen, cyclobenzaprine, dextrose, metoprolol tartrate, morphine injection, ondansetron (ZOFRAN) IV, mouth rinse, oxyCODONE, sodium chloride flush, sodium  chloride flush, sodium chloride flush, traMADol   Vital Signs    Vitals:   11/18/21 0400 11/18/21 0500 11/18/21 0620 11/18/21 0700  BP: 114/66 (!) 144/71 129/77 (!) 103/58  Pulse: 75 78 81 83  Resp: '12 16 11 15  '$ Temp: 99.3 F (37.4 C) 99.3 F (37.4 C) 99.7 F (37.6 C) 99.9 F (37.7 C)  TempSrc:      SpO2: 100% 94% 99% 100%  Weight:  122.7 kg    Height:        Intake/Output Summary (Last 24 hours) at 11/18/2021 0753 Last data filed at 11/18/2021 0600 Gross per 24 hour  Intake 1197.38 ml  Output 1101 ml  Net 96.38 ml      11/18/2021    5:00 AM 11/17/2021    5:00 AM 11/16/2021    2:11 PM  Last 3 Weights  Weight (lbs) 270 lb 8.1 oz 272 lb 7.8 oz 252 lb 3.2 oz  Weight (kg) 122.7 kg 123.6 kg 114.397 kg      Telemetry    Sinus rhythm, some atrial pacing but now in normal sinus - Personally Reviewed   Physical Exam  Alert, oriented, sitting in chair, somewhat uncomfortable GEN: No acute distress.  Neck: No JVD Cardiac: RRR, no murmurs, rubs, or gallops.  Respiratory: Clear to auscultation bilaterally. GI: Soft, nontender, non-distended, obese MS: No edema; No deformity. Neuro:  Nonfocal  Psych: Normal affect   Labs    High Sensitivity Troponin:   Recent Labs  Lab 11/16/21 1411  TROPONINIHS 331*     Chemistry Recent Labs  Lab 11/16/21 1411 11/16/21 1850 11/17/21 0618 11/17/21 1253 11/17/21 1436 11/17/21 1619 11/18/21 0341  NA 136   < > 137   < > 136 138 135  K 4.8   < > 3.7   < > 4.7 4.4 4.1  CL 105   < > 106  --   --  107 104  CO2 25  --  24  --   --  25 24  GLUCOSE 243*   < > 154*  --   --  168* 146*  BUN 12   < > 7*  --   --  9 9  CREATININE 1.14*   < > 1.15*  --   --  1.06* 1.06*  CALCIUM 10.2  --  8.5*  --   --  8.8* 9.5  MG  --   --  2.4  --   --  2.6*  --   PROT 6.6  --   --   --   --   --   --   ALBUMIN 3.5  --   --   --   --   --   --   AST 16  --   --   --   --   --   --   ALT 15  --   --   --   --   --   --   ALKPHOS 61  --   --   --    --   --   --   BILITOT 0.4  --   --   --   --   --   --   GFRNONAA 52*  --  51*  --   --  57* 57*  ANIONGAP 6  --  7  --   --  6 7   < > = values in this interval not displayed.    Lipids No results for input(s): "CHOL", "TRIG", "HDL", "LABVLDL", "LDLCALC", "CHOLHDL" in the last 168 hours.  Hematology Recent Labs  Lab 11/17/21 0455 11/17/21 0504 11/17/21 1436 11/17/21 1619 11/18/21 0341  WBC 6.3  --   --  7.4 11.5*  RBC 3.09*  --   --  3.06* 3.53*  HGB 9.7*   < > 8.8* 9.2* 10.7*  HCT 27.7*   < > 26.0* 27.5* 31.9*  MCV 89.6  --   --  89.9 90.4  MCH 31.4  --   --  30.1 30.3  MCHC 35.0  --   --  33.5 33.5  RDW 14.6  --   --  15.0 15.1  PLT 81*  --   --  PLATELET CLUMPS NOTED ON SMEAR, COUNT APPEARS DECREASED 104*   < > = values in this interval not displayed.   Thyroid No results for input(s): "TSH", "FREET4" in the last 168 hours.  BNPNo results for input(s): "BNP", "PROBNP" in the last 168 hours.  DDimer No results for input(s): "DDIMER" in the last 168 hours.   Radiology    DG Chest Port 1 View  Result Date: 11/17/2021 CLINICAL DATA:  71 year old female with history of CABG yesterday.  Follow-up study. EXAM: PORTABLE CHEST 1 VIEW COMPARISON:  Chest x-ray 11/17/2021. FINDINGS: An endotracheal tube is in place with tip 4.8 cm above the carina. A nasogastric tube is seen extending into the stomach, however, the tip of the nasogastric tube extends below the lower margin of the image. Right internal jugular Cordis through which a Swan-Ganz catheter has been passed into the distal pulmonic trunk. Bilateral chest tubes are noted, with tips and side ports projecting over the mid thorax bilaterally. Lung volumes are low. No definite pneumothorax. Patchy areas of interstitial prominence are noted throughout the mid to lower lungs bilaterally. No pleural effusions. Pulmonary vasculature is mildly engorged. Heart size is mildly enlarged. Upper mediastinal contours are within normal limits.  Atherosclerotic calcifications are noted in the thoracic aorta. Status post median sternotomy. IMPRESSION: 1. Postoperative changes and support apparatus, as above. 2. Patchy areas of interstitial prominence noted throughout the mid to lower lungs bilaterally. Given the engorgement of the pulmonary vasculature, mild interstitial pulmonary edema is suspected. 3. Aortic atherosclerosis. Electronically Signed   By: Vinnie Langton M.D.   On: 11/17/2021 06:22   DG Abd 1 View  Result Date: 11/17/2021 CLINICAL DATA:  Tubes and lines EXAM: ABDOMEN - 1 VIEW COMPARISON:  None Available. FINDINGS: An enteric tube tip and side-port is at the level of the stomach. No evidence of bowel obstruction. History of aortic balloon pump. A marker overlaps the L5 right paramedian level, iliac artery region. IMPRESSION: 1. Enteric tube with tip at the stomach. 2. Balloon marker over the right iliac region. Electronically Signed   By: Jorje Guild M.D.   On: 11/17/2021 06:22   DG Chest Port 1 View  Result Date: 11/17/2021 CLINICAL DATA:  Pneumothorax EXAM: PORTABLE CHEST 1 VIEW COMPARISON:  11/17/2021 FINDINGS: Endotracheal tube is seen 4.4 cm above the carina. Nasogastric tube extends into the upper abdomen beyond the margin of the examination. Right internal jugular Swan-Ganz catheter tip is seen within the expected pulmonary arterial bifurcation. Mediastinal drain, single right and dual left chest tubes are unchanged. Lung volumes are small, but are symmetric. Lungs are clear. No pneumothorax or pleural effusion. Median sternotomy has been performed. Cardiac size within normal limits. IMPRESSION: 1. Support tubes in appropriate position. 2. No pneumothorax. 3. Pulmonary hypoinflation. Electronically Signed   By: Fidela Salisbury M.D.   On: 11/17/2021 01:09   ECHO INTRAOPERATIVE TEE  Result Date: 11/16/2021  *INTRAOPERATIVE TRANSESOPHAGEAL REPORT *  [name] [facility] Date of Exam: 11/16/2021 Medical Rec #:   [id number]        Height:       64.0 in Accession #:    [id number]       Weight:       252.2 lb Date of Birth:  1950-09-22         BSA:          2.16 m Patient Age:    71 years         BP:           227/86 mmHg Patient Gender: F                HR:           66 bpm. Exam Location:  Anesthesiology Transesophogeal exam was perform intraoperatively during surgical procedure. Patient was closely monitored under general anesthesia during the entirety of examination. Indications:     R07.9* Chest pain, unspecified; I10 Hypertension; R94.31  Abnormal EKG; I20.9 Angina pectoris, unspecified; I25.110                  Atherosclerotic heart disease of native coronary artery with                  unstable angina pectoris Performing Phys: [id number] BARTLE Diagnosing Phys: Oleta Mouse MD Complications: No known complications during this procedure. POST-OP IMPRESSIONS _ Left Ventricle: has hyperdynamic systolic function, with an ejection fraction of 70%. The cavity size was normal. The wall motion is normal. _ Right Ventricle: normal function. The cavity was normal. The wall motion is normal. _ Aorta: there is no dissection present in the aorta. _ Aortic Valve: The aortic valve appears unchanged from pre-bypass. _ Mitral Valve: There is mild regurgitation. _ Tricuspid Valve: There is mild regurgitation. PRE-OP FINDINGS  Left Ventricle: The left ventricle has hyperdynamic systolic function, with an ejection fraction of >65%. The cavity size was normal. No evidence of left ventricular regional wall motion abnormalities. There is severe concentric left ventricular hypertrophy. Right Ventricle: The right ventricle has normal systolic function. The cavity was normal. There is no increase in right ventricular wall thickness. Left Atrium: Left atrial size was normal in size. No left atrial/left atrial appendage thrombus was detected. Left atrial appendage velocity is normal at greater than 40 cm/s. Right Atrium:  Right atrial size was normal in size. Interatrial Septum: No atrial level shunt detected by color flow Doppler. There is no evidence of a patent foramen ovale. Pericardium: Trivial pericardial effusion is present. The pericardial effusion is circumferential. Mitral Valve: The mitral valve is normal in structure. Mitral valve regurgitation trace to mild. The MR jet is centrally-directed. There is No evidence of mitral stenosis. Tricuspid Valve: The tricuspid valve was normal in structure. Tricuspid valve regurgitation is trivial by color flow Doppler. No evidence of tricuspid stenosis is present. Aortic Valve: The aortic valve is tricuspid Aortic valve regurgitation was not visualized by color flow Doppler. There is no stenosis of the aortic valve, with a calculated valve area of 2.81 cm. Pulmonic Valve: The pulmonic valve was normal in structure No evidence of pumonic stenosis. Pulmonic valve regurgitation is mild by color flow Doppler. Aorta: There is evidence of a dissection in the none. Balloon pump seen in aorta distal to left subclavian take off. +--------------+--------++ LEFT VENTRICLE         +--------------+--------++ PLAX 2D                +--------------+--------++ LVOT diam:    2.40 cm  +--------------+--------++ LVOT Area:    4.52 cm +--------------+--------++                        +--------------+--------++ +------------------+-----------++ AORTIC VALVE                  +------------------+-----------++ AV Area (Vmax):   3.06 cm    +------------------+-----------++ AV Area (Vmean):  3.42 cm    +------------------+-----------++ AV Area (VTI):    2.81 cm    +------------------+-----------++ AV Vmax:          122.00 cm/s +------------------+-----------++ AV Vmean:         69.500 cm/s +------------------+-----------++ AV VTI:           0.247 m     +------------------+-----------++ AV Peak Grad:     6.0 mmHg    +------------------+-----------++ AV  Mean Grad:     2.0 mmHg    +------------------+-----------++  LVOT Vmax:        82.55 cm/s  +------------------+-----------++ LVOT Vmean:       52.550 cm/s +------------------+-----------++ LVOT VTI:         0.154 m     +------------------+-----------++ LVOT/AV VTI ratio:0.62        +------------------+-----------++ +-------------+---------++ MITRAL VALVE           +--------------+-------+ +-------------+---------++ SHUNTS                MV Peak grad:1.7 mmHg  +--------------+-------+ +-------------+---------++ Systemic VTI: 0.15 m  MV Mean grad:1.0 mmHg  +--------------+-------+ +-------------+---------++ Systemic Diam:2.40 cm MV Vmax:     0.66 m/s  +--------------+-------+ +-------------+---------++ MV Vmean:    44.2 cm/s +-------------+---------++ MV VTI:      0.23 m    +-------------+---------++  Oleta Mouse MD Electronically signed by Oleta Mouse MD Signature Date/Time: 11/16/2021/11:59:33 PM    Final    CARDIAC CATHETERIZATION  Result Date: 11/16/2021   Mid LM to Prox LAD lesion is 95% stenosed with 99% stenosed side branch in Ost Cx.  Ramus lesion is 95% stenosed. - Trifucation lesion   Mid LAD stent is 5% stenosed.  Dist LAD stent is 10% stenosed.   Prox RCA lesion is 45% stenosed.   The left ventricular systolic function is normal.  The left ventricular ejection fraction is 50-55% by visual estimate.   There is no aortic valve stenosis. ----------------------- Severe distal LM-trifurcation LAD, RI, LCx 95-99 % eccentric stenosis with minimal downstream disease. LAD has mid and distal vessel stent with diffuse calcification.  Stents are widely patent.  LAD reaches the apex.  1 very proximal ramus like small diagonal branch followed by 2 additional diagonal branches.  Too small for bypass. Ramus or medius is a large-caliber vessel that reaches the apex with distal branches. LCx courses as of the lateral OM/LPL distally after giving rise to  small to moderate-sized OM1, OM 2 and OM 4 with minuscule OM 3.  Too small for grafting. RCA has proximal eccentric 45 to 50% stenosis with minimal distal disease giving rise to RPDA and 2 PL branches.  No significant disease in the RCA system. Preserved LVEF with mild mid to apical anterior hypokinesis. Hemodynamics: Central AoP: 176/89 with a MAP 124 mmHg LVP/EDP: 172/5-15 mmHg Recommendations: Urgent/emergent CVTS consultation with Dr. Cyndia Bent performed in the Cath Lab.  Plan is for emergent CABG Continue IABP via RFA Radial sheath sutured in place to be used for arterial line during procedure.  Removal in the CVICU post CABG with TR band placed by catheter supervisor.  Glenetta Hew, M.D., M.S. Interventional Cardiologist   DG Chest Port 1 View  Result Date: 11/16/2021 CLINICAL DATA:  Chest pain EXAM: PORTABLE CHEST 1 VIEW COMPARISON:  06/04/2021 from [facility] [place] FINDINGS: Numerous leads and wires project over the chest. Midline trachea. Normal heart size. No pleural effusion or pneumothorax. Clear lungs. IMPRESSION: No active disease. Electronically Signed   By: Abigail Miyamoto M.D.   On: 11/16/2021 14:30     Patient Profile     71 y.o. 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  Non-STEMI: Status post emergent CABG, progressing well.  Continue current medical therapy.  Plans noted to resume clopidogrel once her pacing wires are out.  Management per surgical team. 2.  Mixed hyperlipidemia: Needs lifestyle modification, rosuvastatin  initiated.  Also treated with ezetimibe. 3.  Tobacco abuse: Cessation counseling done  Appears stable in early postoperative period.  Management per Dr. Cyndia Bent.  Heart rhythm stable.  We will follow.  For questions or updates, please contact [place] Please consult [email or web address] for  contact info under        Signed, Sherren Mocha, MD  11/18/2021, 7:53 AM

## 2021-11-18 NOTE — Progress Notes (Signed)
2 Days Post-Op Procedure(s) (LRB): CORONARY ARTERY BYPASS GRAFTING (CABG) times three using the left internal mammary and right saphenous vein. (N/A) TRANSESOPHAGEAL ECHOCARDIOGRAM (TEE) (N/A) Subjective: Complains of chest wall pain from surgery. Up in chair  Objective: Vital signs in last 24 hours: Temp:  [98.2 F (36.8 C)-99.9 F (37.7 C)] 99.9 F (37.7 C) (06/28 0700) Pulse Rate:  [75-83] 83 (06/28 0700) Cardiac Rhythm: Atrial paced;Normal sinus rhythm (06/28 0400) Resp:  [5-22] 15 (06/28 0700) BP: (84-144)/(52-97) 103/58 (06/28 0700) SpO2:  [92 %-100 %] 100 % (06/28 0700) FiO2 (%):  [40 %-70 %] 40 % (06/27 1412) Weight:  [122.7 kg] 122.7 kg (06/28 0500)  Hemodynamic parameters for last 24 hours: PAP: (26-38)/(11-20) 34/20 CO:  [3.8 L/min-4 L/min] 3.8 L/min CI:  [1.7 L/min/m2-1.9 L/min/m2] 1.7 L/min/m2  Intake/Output from previous day: 06/27 0701 - 06/28 0700 In: 1197.4 [P.O.:50; I.V.:797.4; IV Piggyback:350] Out: 1101 [Urine:520; Chest Tube:581] Intake/Output this shift: No intake/output data recorded.  General appearance: alert and cooperative Neurologic: intact Heart: regular rate and rhythm, S1, S2 normal, no murmur Lungs: clear to auscultation bilaterally Extremities: edema moderate Wound: dressing dry  Lab Results: Recent Labs    11/17/21 1619 11/18/21 0341  WBC 7.4 11.5*  HGB 9.2* 10.7*  HCT 27.5* 31.9*  PLT PLATELET CLUMPS NOTED ON SMEAR, COUNT APPEARS DECREASED 104*   BMET:  Recent Labs    11/17/21 1619 11/18/21 0341  NA 138 135  K 4.4 4.1  CL 107 104  CO2 25 24  GLUCOSE 168* 146*  BUN 9 9  CREATININE 1.06* 1.06*  CALCIUM 8.8* 9.5    PT/INR:  Recent Labs    11/17/21 0018  LABPROT 19.4*  INR 1.7*   ABG    Component Value Date/Time   PHART 7.355 11/17/2021 0018   HCO3 22.3 11/17/2021 1436   TCO2 24 11/17/2021 1436   ACIDBASEDEF 4.0 (H) 11/17/2021 1436   O2SAT 60 11/17/2021 1436   CBG (last 3)  Recent Labs    11/17/21 1942  11/17/21 2323 11/18/21 0312  GLUCAP 130* 164* 152*    Assessment/Plan: S/P Procedure(s) (LRB): CORONARY ARTERY BYPASS GRAFTING (CABG) times three using the left internal mammary and right saphenous vein. (N/A) TRANSESOPHAGEAL ECHOCARDIOGRAM (TEE) (N/A)  POD 1 Hemodynamically stable in sinus rhythm. Continue low dose Lopressor.  Volume excess: start diuresis and replace K+  DM: glucose under adequate control on Levemir and SSI.  DC chest tubes today.  IS, mobilization. Will request PT consult. 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  Plan to start Plavix with ASA after pacing wire removal due to NSTEMI, previous stents and severe disease.   LOS: 2 days    Gaye Pollack 11/18/2021

## 2021-11-18 NOTE — Discharge Instructions (Signed)

## 2021-11-18 NOTE — Progress Notes (Signed)
Patient ID: Katelyn Kaufman, female   DOB: [birth year], 71 y.o.   MRN: [id number] TCTS Evening Rounds:  Hemodynamically stable in sinus rhythm. Walked 150 ft Diuresing well Chest tubes out. Still complains of some chest wall pain but better.

## 2021-11-18 NOTE — Progress Notes (Signed)
Cardiology Office Note    Date:  12/02/2021   ID:  [name], DOB 09-05-1950, MRN [id number]   PCP:  Katelyn Rio, MD   [facility]  Cardiologist:  Katelyn Lesches, MD   Advanced Practice Provider:  No care team member to display Electrophysiologist:  None   [phone]   Chief Complaint  Patient presents with   Hospitalization Follow-up    History of Present Illness:  Katelyn Kaufman is a 71 y.o. female with history of HTN, HLD, DM,  reported history of CAD in [place]. She states that she had 3 separate heart attacks in 2017, 2018, and 2019, receiving a stent intervention with each of these events.   She saw Dr. Domenic Polite 07/08/20 with chest pain and limited with chronic back and leg pain followed in pain clinic. Lexiscan and echo both normal. She was maintained on Plavix, coreg, zetia, imdur losartan and crestor.  Patient was admitted with NSTEMI 11/16/21 and found to have severe LM and underwent emergency CABG x 3 LIMA-LAD, SVG-OM, SVG-RI.  Patient comes in with her girlfriend. Has a lot of chest soreness. Says she can't hold anything in her right hand-arthritis is acting up-sore all the time. Gripis good on exam.. Complains of DOE. Intraop TEE EF 70%. She Quit smoking!  Past Medical History:  Diagnosis Date   Arthritis    CAD (coronary artery disease) 2018   COPD (chronic obstructive pulmonary disease) ([place])    Essential hypertension    Hyperlipidemia    Myocardial infarction ([place])    had MIs in 2017, 2018, and 2019 while living in [place]. all intervened on   OSA (obstructive sleep apnea)    CPAP qHS   Type 2 diabetes mellitus ([place])     Past Surgical History:  Procedure Laterality Date   BACK SURGERY     BREAST SURGERY     CATARACT EXTRACTION W/PHACO Right 10/24/2020   Procedure: CATARACT EXTRACTION PHACO AND INTRAOCULAR LENS PLACEMENT ([place]);  Surgeon: Katelyn Goldmann, MD;  Location: [facility];  Service: Ophthalmology;  Laterality: Right;   CDE: 10.87   CATARACT EXTRACTION W/PHACO Left 12/01/2020   Procedure: CATARACT EXTRACTION PHACO AND INTRAOCULAR LENS PLACEMENT LEFT EYE;  Surgeon: Katelyn Goldmann, MD;  Location: [facility];  Service: Ophthalmology;  Laterality: Left;  CDE  8.62   CORONARY ARTERY BYPASS GRAFT N/A 11/16/2021   Procedure: CORONARY ARTERY BYPASS GRAFTING (CABG) times three using the left internal mammary and right saphenous vein.;  Surgeon: Katelyn Pollack, MD;  Location: [facility];  Service: Open Heart Surgery;  Laterality: N/A;   IABP INSERTION N/A 11/16/2021   Procedure: IABP Insertion;  Surgeon: Katelyn Man, MD;  Location: [facility];  Service: Cardiovascular;  Laterality: N/A;   LEFT HEART CATH AND CORONARY ANGIOGRAPHY N/A 11/16/2021   Procedure: LEFT HEART CATH AND CORONARY ANGIOGRAPHY;  Surgeon: Katelyn Man, MD;  Location: [facility];  Service: Cardiovascular;  Laterality: N/A;   REPLACEMENT TOTAL KNEE Right    TEE WITHOUT CARDIOVERSION N/A 11/16/2021   Procedure: TRANSESOPHAGEAL ECHOCARDIOGRAM (TEE);  Surgeon: Katelyn Pollack, MD;  Location: [facility];  Service: Open Heart Surgery;  Laterality: N/A;   TUBAL LIGATION      Current Medications: Current Meds  Medication Sig   Ascorbic Acid (VITAMIN C) 1000 MG tablet Take 1,000 mg by mouth daily.   aspirin EC 81 MG tablet Take 1 tablet (81 mg total) by mouth daily. Swallow whole.  Calcium Carb-Cholecalciferol (CALCIUM-VITAMIN D3) 600-400 MG-UNIT TABS Take 1 capsule by mouth daily.   cholecalciferol (VITAMIN D3) 25 MCG (1000 UNIT) tablet Take 1,000 Units by mouth daily.   clopidogrel (PLAVIX) 75 MG tablet TAKE 1 TABLET BY MOUTH EVERY DAY   cyclobenzaprine (FLEXERIL) 10 MG tablet Take 10 mg by mouth 2 (two) times daily as needed for muscle spasms.   DULoxetine (CYMBALTA) 60 MG capsule Take 60 mg by mouth daily.   ezetimibe (ZETIA) 10 MG tablet Take 10 mg by mouth daily.   glimepiride (AMARYL) 4 MG tablet Take 4 mg by mouth daily with breakfast.    hydrOXYzine (ATARAX) 25 MG tablet Take 25 mg by mouth in the morning and at bedtime.   insulin glargine (LANTUS) 100 UNIT/ML injection Inject 40 Units into the skin at bedtime.   isosorbide mononitrate (IMDUR) 60 MG 24 hr tablet Take 1 tablet by mouth daily.   losartan (COZAAR) 25 MG tablet Take 1 tablet (25 mg total) by mouth daily.   metFORMIN (GLUCOPHAGE) 500 MG tablet Take 500-1,000 mg by mouth 2 (two) times daily with a meal. Take 2 tablets (1000 mg) in the morning and Take 1 tablets (500 mg) in the evening   metoprolol tartrate (LOPRESSOR) 25 MG tablet Take 1 tablet (25 mg total) by mouth 2 (two) times daily.   mometasone (ELOCON) 0.1 % ointment Apply 1 Application topically daily.   nitroGLYCERIN (NITROSTAT) 0.4 MG SL tablet Place 0.4 mg under the tongue every 5 (five) minutes as needed for chest pain.   Omega-3 Fatty Acids (DIALYVITE OMEGA-3 CONCENTRATE) 600 MG CAPS Take 600 mg by mouth daily.   oxyCODONE-acetaminophen (PERCOCET) 10-325 MG tablet Take 1 tablet by mouth every 4 (four) hours as needed for pain.   pregabalin (LYRICA) 75 MG capsule Take 75-150 mg by mouth 2 (two) times daily. Take 2 tablets (150 mg) in the morning & Take 1 tablet (75 mg) at bedtime   rosuvastatin (CRESTOR) 10 MG tablet Take 10 mg by mouth daily.   triamcinolone ointment (KENALOG) 0.1 % Apply 1 Application topically daily as needed (For rash).   vitamin B-12 (CYANOCOBALAMIN) 1000 MCG tablet Take 1,000 mcg by mouth daily.     Allergies:   Latex   Social History   Socioeconomic History   Marital status: Widowed    Spouse name: Not on file   Number of children: Not on file   Years of education: Not on file   Highest education level: Not on file  Occupational History   Not on file  Tobacco Use   Smoking status: Every Day    Packs/day: 0.50    Years: 40.00    Total pack years: 20.00    Types: Cigarettes   Smokeless tobacco: Never  Vaping Use   Vaping Use: Never used  Substance and Sexual Activity    Alcohol use: Never   Drug use: Never   Sexual activity: Not Currently  Other Topics Concern   Not on file  Social History Narrative   Not on file   Social Determinants of Health   Financial Resource Strain: Not on file  Food Insecurity: Not on file  Transportation Needs: Not on file  Physical Activity: Not on file  Stress: Not on file  Social Connections: Not on file     Family History:  The patient's  family history includes Alcohol abuse in her father; COPD in her mother; Heart attack in her brother; Hypertension in her sister.   ROS:  Please see the history of present illness.    ROS All other systems reviewed and are negative.   PHYSICAL EXAM:   VS:  BP 111/67   Pulse 85   Ht '5\' 4"'$  (1.626 m)   Wt 254 lb (115.2 kg)   SpO2 97%   BMI 43.60 kg/m   Physical Exam  ZMO:QHUTM,LY no acute distress  Neck: no JVD, carotid bruits, or masses Cardiac:RRR; no murmurs, rubs, or gallops, scar looks good  Respiratory:  decreased breath sounds but clear to auscultation bilaterally, normal work of breathing GI: soft, nontender, nondistended, + BS Ext: without cyanosis, clubbing, or edema, Good distal pulses bilaterally Neuro:  Alert and Oriented x 3, excellent grip bilaterally Psych: euthymic mood, full affect  Wt Readings from Last 3 Encounters:  12/02/21 254 lb (115.2 kg)  11/23/21 243 lb 2.7 oz (110.3 kg)  08/11/21 252 lb 3.2 oz (114.4 kg)      Studies/Labs Reviewed:   EKG:  EKG is not ordered today.     Recent Labs: 07/28/2021: B Natriuretic Peptide 86.0 11/16/2021: ALT 15 11/17/2021: Magnesium 2.6 11/20/2021: BUN 7; Creatinine, Ser 1.03; Potassium 4.0; Sodium 134 11/22/2021: Hemoglobin 11.9; Platelets 194   Lipid Panel No results found for: "CHOL", "TRIG", "HDL", "CHOLHDL", "VLDL", "LDLCALC", "LDLDIRECT"  Additional studies/ records that were reviewed today include:    Cath 11/16/24   Mid LM to Prox LAD lesion is 95% stenosed with 99% stenosed side branch in Ost  Cx.  Ramus lesion is 95% stenosed. - Trifucation lesion   Mid LAD stent is 5% stenosed.  Dist LAD stent is 10% stenosed.   Prox RCA lesion is 45% stenosed.   The left ventricular systolic function is normal.  The left ventricular ejection fraction is 50-55% by visual estimate.   There is no aortic valve stenosis.   ----------------------- Severe distal LM-trifurcation LAD, RI, LCx 95-99 % eccentric stenosis with minimal downstream disease. LAD has mid and distal vessel stent with diffuse calcification.  Stents are widely patent.  LAD reaches the apex.  1 very proximal ramus like small diagonal branch followed by 2 additional diagonal branches.  Too small for bypass. Ramus or medius is a large-caliber vessel that reaches the apex with distal branches. LCx courses as of the lateral OM/LPL distally after giving rise to small to moderate-sized OM1, OM 2 and OM 4 with minuscule OM 3.  Too small for grafting. RCA has proximal eccentric 45 to 50% stenosis with minimal distal disease giving rise to RPDA and 2 PL branches.  No significant disease in the RCA system. Preserved LVEF with mild mid to apical anterior hypokinesis. Hemodynamics:  Central AoP: 176/89 with a MAP 124 mmHg LVP/EDP: 172/5-15 mmHg   Recommendations: Urgent/emergent CVTS consultation with Dr. Cyndia Bent performed in the Cath Lab.  Plan is for emergent CABG Continue IABP via RFA Radial sheath sutured in place to be used for arterial line during procedure.  Removal in the CVICU post CABG with TR band placed by catheter supervisor.     Glenetta Hew, M.D., M.S. Interventional Cardiologist   Echo 11/16/21 POST-OP IMPRESSIONS  _ Left Ventricle: has hyperdynamic systolic function, with an ejection  fraction  of 70%. The cavity size was normal. The wall motion is normal.  _ Right Ventricle: normal function. The cavity was normal. The wall motion  is  normal.  _ Aorta: there is no dissection present in the aorta.  _ Aortic Valve: The  aortic valve appears unchanged from pre-bypass.  _ Mitral  Valve: There is mild regurgitation.  _ Tricuspid Valve: There is mild regurgitation.   PRE-OP FINDINGS   Left Ventricle: The left ventricle has hyperdynamic systolic function,  with an ejection fraction of >65%. The cavity size was normal. No evidence  of left ventricular regional wall motion abnormalities. There is severe  concentric left ventricular  hypertrophy.    Right Ventricle: The right ventricle has normal systolic function. The  cavity was normal. There is no increase in right ventricular wall  thickness.   Left Atrium: Left atrial size was normal in size. No left atrial/left  atrial appendage thrombus was detected. Left atrial appendage velocity is  normal at greater than 40 cm/s.   Right Atrium: Right atrial size was normal in size.   Interatrial Septum: No atrial level shunt detected by color flow Doppler.  There is no evidence of a patent foramen ovale.   Pericardium: Trivial pericardial effusion is present. The pericardial  effusion is circumferential.   Mitral Valve: The mitral valve is normal in structure. Mitral valve  regurgitation trace to mild. The MR jet is centrally-directed. There is No  evidence of mitral stenosis.   Tricuspid Valve: The tricuspid valve was normal in structure. Tricuspid  valve regurgitation is trivial by color flow Doppler. No evidence of  tricuspid stenosis is present.   Aortic Valve: The aortic valve is tricuspid Aortic valve regurgitation was  not visualized by color flow Doppler. There is no stenosis of the aortic  valve, with a calculated valve area of 2.81 cm.    Pulmonic Valve: The pulmonic valve was normal in structure No evidence of  pumonic stenosis.  Pulmonic valve regurgitation is mild by color flow Doppler.    Aorta: There is evidence of a dissection in the none. Balloon pump seen in  aorta distal to left subclavian take off.   Risk  Assessment/Calculations:         ASSESSMENT:    1. Coronary artery disease involving native coronary artery of native heart without angina pectoris   2. Essential hypertension   3. Hyperlipidemia, unspecified hyperlipidemia type   4. Type 2 diabetes mellitus with complication, with long-term current use of insulin (HCC)      PLAN:  In order of problems listed above:  CAD S/P NSTEMI and emergency CABG x 3 11/16/21-overall doing well, sore in chest and working with PT. On Plavix and ASA, zetia, crestor, losartan, metoprol. F/u with Dr. Domenic Polite  HTN BP controlled  HLD now on zetia, crestor LDL 214 07/29/2020  DM2 A1C 8.9 managed by PCP  Shared Decision Making/Informed Consent        Medication Adjustments/Labs and Tests Ordered: Current medicines are reviewed at length with the patient today.  Concerns regarding medicines are outlined above.  Medication changes, Labs and Tests ordered today are listed in the Patient Instructions below. Patient Instructions  Medication Instructions:  Your physician recommends that you continue on your current medications as directed. Please refer to the Current Medication list given to you today.  *If you need a refill on your cardiac medications before your next appointment, please call your pharmacy*   Lab Work: None If you have labs (blood work) drawn today and your tests are completely normal, you will receive your results only by: [place] (if you have MyChart) OR A paper copy in the mail If you have any lab test that is abnormal or we need to change your treatment, we will call you to review the results.  Follow-Up: At [facility], you and your health needs are our priority.  As part of our continuing mission to provide you with exceptional heart care, we have created designated Provider Care Teams.  These Care Teams include your primary Cardiologist (physician) and Advanced Practice Providers (APPs -  Physician Assistants  and Nurse Practitioners) who all work together to provide you with the care you need, when you need it.  Your next appointment:   03/03/22    Signed, Ermalinda Barrios, PA-C  12/02/2021 10:29 AM    [facility] [address], [place], [place]  [address] Phone: [phone]; Fax: [phone]

## 2021-11-18 NOTE — Anesthesia Postprocedure Evaluation (Signed)
Anesthesia Post Note  Patient: Katelyn Kaufman  Procedure(s) Performed: CORONARY ARTERY BYPASS GRAFTING (CABG) times three using the left internal mammary and right saphenous vein. (Chest) TRANSESOPHAGEAL ECHOCARDIOGRAM (TEE)     Patient location during evaluation: SICU Anesthesia Type: General Level of consciousness: sedated Pain management: pain level controlled Vital Signs Assessment: post-procedure vital signs reviewed and stable Respiratory status: patient remains intubated per anesthesia plan Cardiovascular status: stable Postop Assessment: no apparent nausea or vomiting Anesthetic complications: no   No notable events documented.  Last Vitals:  Vitals:   11/18/21 0800 11/18/21 0900  BP: (!) 104/47 (!) 92/32  Pulse: 87 86  Resp: (!) 9 10  Temp: 37.7 C 37.7 C  SpO2: 90% 100%    Last Pain:  Vitals:   11/18/21 0800  TempSrc:   PainSc: Asleep                 Jozy Mcphearson

## 2021-11-19 ENCOUNTER — Inpatient Hospital Stay (HOSPITAL_COMMUNITY): Payer: Medicare HMO

## 2021-11-19 ENCOUNTER — Inpatient Hospital Stay: Payer: Medicare HMO | Admitting: Hematology

## 2021-11-19 LAB — GLUCOSE, CAPILLARY
Glucose-Capillary: 171 mg/dL — ABNORMAL HIGH (ref 70–99)
Glucose-Capillary: 204 mg/dL — ABNORMAL HIGH (ref 70–99)
Glucose-Capillary: 208 mg/dL — ABNORMAL HIGH (ref 70–99)
Glucose-Capillary: 226 mg/dL — ABNORMAL HIGH (ref 70–99)
Glucose-Capillary: 192 mg/dL — ABNORMAL HIGH (ref 70–99)

## 2021-11-19 LAB — BASIC METABOLIC PANEL
Anion gap: 6 (ref 5–15)
BUN: 10 mg/dL (ref 8–23)
CO2: 27 mmol/L (ref 22–32)
Calcium: 9.7 mg/dL (ref 8.9–10.3)
Chloride: 101 mmol/L (ref 98–111)
Creatinine, Ser: 1.1 mg/dL — ABNORMAL HIGH (ref 0.44–1.00)
GFR, Estimated: 54 mL/min — ABNORMAL LOW (ref >60)
Glucose, Bld: 183 mg/dL — ABNORMAL HIGH (ref 70–99)
Potassium: 4.2 mmol/L (ref 3.5–5.1)
Sodium: 134 mmol/L — ABNORMAL LOW (ref 135–145)

## 2021-11-19 LAB — CBC
HCT: 29 % — ABNORMAL LOW (ref 36.0–46.0)
Hemoglobin: 9.8 g/dL — ABNORMAL LOW (ref 12.0–15.0)
MCH: 30.6 pg (ref 26.0–34.0)
MCHC: 33.8 g/dL (ref 30.0–36.0)
MCV: 90.6 fL (ref 80.0–100.0)
Platelets: 101 10*3/uL — ABNORMAL LOW (ref 150–400)
RBC: 3.2 MIL/uL — ABNORMAL LOW (ref 3.87–5.11)
RDW: 14.5 % (ref 11.5–15.5)
WBC: 10 10*3/uL (ref 4.0–10.5)
nRBC: 0 % (ref 0.0–0.2)

## 2021-11-19 MED ORDER — SODIUM CHLORIDE 0.9% FLUSH: 3.0000 mL | INTRAVENOUS | Status: DC | PRN

## 2021-11-19 MED ORDER — SODIUM CHLORIDE 0.9 % IV SOLN: 250.0000 mL | INTRAVENOUS | Status: DC | PRN

## 2021-11-19 MED ORDER — FUROSEMIDE 40 MG PO TABS
40.0000 mg | ORAL_TABLET | Freq: Two times a day (BID) | ORAL | Status: DC
  Administered 2021-11-19 (×2): 40 mg via ORAL
  Filled 2021-11-19 (×2): qty 1

## 2021-11-19 MED ORDER — METOLAZONE 2.5 MG PO TABS
2.5000 mg | ORAL_TABLET | Freq: Every day | ORAL | Status: DC
  Administered 2021-11-19: 2.5 mg via ORAL
  Filled 2021-11-19: qty 1

## 2021-11-19 MED ORDER — ~~LOC~~ CARDIAC SURGERY, PATIENT & FAMILY EDUCATION: Freq: Once | Status: AC

## 2021-11-19 MED ORDER — METOPROLOL TARTRATE 25 MG PO TABS
25.0000 mg | ORAL_TABLET | Freq: Two times a day (BID) | ORAL | Status: DC
  Administered 2021-11-19 – 2021-11-23 (×8): 25 mg via ORAL
  Filled 2021-11-19 (×8): qty 1

## 2021-11-19 MED ORDER — BISACODYL 5 MG PO TBEC: 10.0000 mg | DELAYED_RELEASE_TABLET | Freq: Every day | ORAL | Status: DC | PRN

## 2021-11-19 MED ORDER — BISACODYL 10 MG RE SUPP: 10.0000 mg | Freq: Every day | RECTAL | Status: DC | PRN

## 2021-11-19 MED ORDER — INSULIN DETEMIR 100 UNIT/ML ~~LOC~~ SOLN
35.0000 [IU] | Freq: Two times a day (BID) | SUBCUTANEOUS | Status: DC
  Administered 2021-11-19 (×2): 35 [IU] via SUBCUTANEOUS
  Filled 2021-11-19 (×4): qty 0.35

## 2021-11-19 MED ORDER — ASPIRIN 325 MG PO TBEC
325.0000 mg | DELAYED_RELEASE_TABLET | Freq: Every day | ORAL | Status: DC
  Administered 2021-11-20: 325 mg via ORAL
  Filled 2021-11-19: qty 1

## 2021-11-19 MED ORDER — SODIUM CHLORIDE 0.9% FLUSH
3.0000 mL | Freq: Two times a day (BID) | INTRAVENOUS | Status: DC
Start: 2021-11-19 — End: 2021-11-23
  Administered 2021-11-19 – 2021-11-22 (×7): 3 mL via INTRAVENOUS

## 2021-11-19 MED ORDER — DOCUSATE SODIUM 100 MG PO CAPS
200.0000 mg | ORAL_CAPSULE | Freq: Every day | ORAL | Status: DC
  Administered 2021-11-20 – 2021-11-22 (×3): 200 mg via ORAL
  Filled 2021-11-19 (×3): qty 2

## 2021-11-19 MED ORDER — POTASSIUM CHLORIDE CRYS ER 20 MEQ PO TBCR
20.0000 meq | EXTENDED_RELEASE_TABLET | Freq: Two times a day (BID) | ORAL | Status: AC
  Administered 2021-11-19 – 2021-11-20 (×4): 20 meq via ORAL
  Filled 2021-11-19 (×4): qty 1

## 2021-11-19 MED ORDER — METOPROLOL TARTRATE 25 MG PO TABS
25.0000 mg | ORAL_TABLET | Freq: Two times a day (BID) | ORAL | Status: DC
  Administered 2021-11-19: 25 mg via ORAL
  Filled 2021-11-19: qty 1

## 2021-11-19 MED ORDER — METOPROLOL TARTRATE 25 MG/10 ML ORAL SUSPENSION: 25.0000 mg | Freq: Two times a day (BID) | ORAL | Status: DC

## 2021-11-19 MED FILL — Thrombin (Recombinant) For Soln 20000 Unit: CUTANEOUS | Qty: 1 | Status: AC

## 2021-11-19 NOTE — Progress Notes (Signed)
3 Days Post-Op Procedure(s) (LRB): CORONARY ARTERY BYPASS GRAFTING (CABG) times three using the left internal mammary and right saphenous vein. (N/A) TRANSESOPHAGEAL ECHOCARDIOGRAM (TEE) (N/A) Subjective: Sore chest but walked 100 ft this am. Sitting up eating breakfast.  Objective: Vital signs in last 24 hours: Temp:  [98.1 F (36.7 C)-100.2 F (37.9 C)] 98.6 F (37 C) (06/29 0700) Pulse Rate:  [72-89] 82 (06/29 0600) Cardiac Rhythm: Normal sinus rhythm (06/28 2000) Resp:  [9-21] 15 (06/29 0600) BP: (92-156)/(32-95) 156/67 (06/29 0600) SpO2:  [90 %-100 %] 94 % (06/29 0600) Weight:  [121.1 kg] 121.1 kg (06/29 0630)  Hemodynamic parameters for last 24 hours:    Intake/Output from previous day: 06/28 0701 - 06/29 0700 In: 1007.5 [P.O.:650; I.V.:257.5; IV Piggyback:100] Out: 1960 [[id number]] Intake/Output this shift: No intake/output data recorded.  General appearance: alert and cooperative Neurologic: intact Heart: regular rate and rhythm, S1, S2 normal, no murmur Lungs: clear to auscultation bilaterally Extremities: edema mild Wound: dressing dry  Lab Results: Recent Labs    11/18/21 0341 11/19/21 0407  WBC 11.5* 10.0  HGB 10.7* 9.8*  HCT 31.9* 29.0*  PLT 104* 101*   BMET:  Recent Labs    11/18/21 0341 11/19/21 0407  NA 135 134*  K 4.1 4.2  CL 104 101  CO2 24 27  GLUCOSE 146* 183*  BUN 9 10  CREATININE 1.06* 1.10*  CALCIUM 9.5 9.7    PT/INR:  Recent Labs    11/17/21 0018  LABPROT 19.4*  INR 1.7*   ABG    Component Value Date/Time   PHART 7.355 11/17/2021 0018   HCO3 22.3 11/17/2021 1436   TCO2 24 11/17/2021 1436   ACIDBASEDEF 4.0 (H) 11/17/2021 1436   O2SAT 60 11/17/2021 1436   CBG (last 3)  Recent Labs    11/18/21 2337 11/19/21 0407 11/19/21 0701  GLUCAP 190* 171* 204*   CXR: mild left base atelectasis  Assessment/Plan: S/P Procedure(s) (LRB): CORONARY ARTERY BYPASS GRAFTING (CABG) times three using the left internal mammary and  right saphenous vein. (N/A) TRANSESOPHAGEAL ECHOCARDIOGRAM (TEE) (N/A)  POD 2  Hemodynamically stable in sinus rhythm. Continue Lopressor.  Volume excess: -952 cc yesterday. Wt down 3.5 lbs. Still 15 lbs over preop recorded wt if accurate and she is edematous. Continue lasix bid for a couple days and low dose metolazone for two days. Replacing K+.  DM: glucose still 200 at times. Will increase Levemir and continue SSI. She is eating this am.  DC sleeve and foley.  Transfer to 4E and continue mobilization, PT, IS. She has chronic deconditioning and disability due to spine disease combined with morbid obesity.   LOS: 3 days    Gaye Pollack 11/19/2021

## 2021-11-19 NOTE — Plan of Care (Signed)
Problem: Education: Goal: Knowledge of General Education information will improve Description: Including pain rating scale, medication(s)/side effects and non-pharmacologic comfort measures Outcome: Progressing   Problem: Health Behavior/Discharge Planning: Goal: Ability to manage health-related needs will improve Outcome: Progressing   Problem: Clinical Measurements: Goal: Ability to maintain clinical measurements within normal limits will improve Outcome: Progressing Goal: Will remain free from infection Outcome: Progressing Goal: Diagnostic test results will improve Outcome: Progressing Goal: Respiratory complications will improve Outcome: Progressing Goal: Cardiovascular complication will be avoided Outcome: Progressing   Problem: Activity: Goal: Risk for activity intolerance will decrease Outcome: Progressing   Problem: Nutrition: Goal: Adequate nutrition will be maintained Outcome: Progressing   Problem: Coping: Goal: Level of anxiety will decrease Outcome: Progressing   Problem: Elimination: Goal: Will not experience complications related to bowel motility Outcome: Progressing Goal: Will not experience complications related to urinary retention Outcome: Progressing   Problem: Pain Managment: Goal: General experience of comfort will improve Outcome: Progressing   Problem: Safety: Goal: Ability to remain free from injury will improve Outcome: Progressing   Problem: Skin Integrity: Goal: Risk for impaired skin integrity will decrease Outcome: Progressing   Problem: Education: Goal: Understanding of CV disease, CV risk reduction, and recovery process will improve Outcome: Progressing Goal: Individualized Educational Video(s) Outcome: Progressing   Problem: Activity: Goal: Ability to return to baseline activity level will improve Outcome: Progressing   Problem: Cardiovascular: Goal: Ability to achieve and maintain adequate cardiovascular perfusion  will improve Outcome: Progressing Goal: Vascular access site(s) Level 0-1 will be maintained Outcome: Progressing   Problem: Health Behavior/Discharge Planning: Goal: Ability to safely manage health-related needs after discharge will improve Outcome: Progressing   Problem: Education: Goal: Will demonstrate proper wound care and an understanding of methods to prevent future damage Outcome: Progressing Goal: Knowledge of disease or condition will improve Outcome: Progressing Goal: Knowledge of the prescribed therapeutic regimen will improve Outcome: Progressing Goal: Individualized Educational Video(s) Outcome: Progressing   Problem: Activity: Goal: Risk for activity intolerance will decrease Outcome: Progressing   Problem: Cardiac: Goal: Will achieve and/or maintain hemodynamic stability Outcome: Progressing   Problem: Clinical Measurements: Goal: Postoperative complications will be avoided or minimized Outcome: Progressing   Problem: Respiratory: Goal: Respiratory status will improve Outcome: Progressing   Problem: Skin Integrity: Goal: Wound healing without signs and symptoms of infection Outcome: Progressing Goal: Risk for impaired skin integrity will decrease Outcome: Progressing   Problem: Urinary Elimination: Goal: Ability to achieve and maintain adequate renal perfusion and functioning will improve Outcome: Progressing   Problem: Education: Goal: Ability to describe self-care measures that may prevent or decrease complications (Diabetes Survival Skills Education) will improve Outcome: Progressing Goal: Individualized Educational Video(s) Outcome: Progressing   Problem: Coping: Goal: Ability to adjust to condition or change in health will improve Outcome: Progressing   Problem: Fluid Volume: Goal: Ability to maintain a balanced intake and output will improve Outcome: Progressing   Problem: Health Behavior/Discharge Planning: Goal: Ability to identify  and utilize available resources and services will improve Outcome: Progressing Goal: Ability to manage health-related needs will improve Outcome: Progressing   Problem: Metabolic: Goal: Ability to maintain appropriate glucose levels will improve Outcome: Progressing   Problem: Nutritional: Goal: Maintenance of adequate nutrition will improve Outcome: Progressing Goal: Progress toward achieving an optimal weight will improve Outcome: Progressing   Problem: Skin Integrity: Goal: Risk for impaired skin integrity will decrease Outcome: Progressing   Problem: Tissue Perfusion: Goal: Adequacy of tissue perfusion will improve Outcome: Progressing   Problem: Education: Goal:  Ability to describe self-care measures that may prevent or decrease complications (Diabetes Survival Skills Education) will improve Outcome: Progressing Goal: Individualized Educational Video(s) Outcome: Progressing

## 2021-11-19 NOTE — Progress Notes (Signed)
EVENING ROUNDS NOTE :     [address]       [place] [address]             [phone]                 3 Days Post-Op Procedure(s) (LRB): CORONARY ARTERY BYPASS GRAFTING (CABG) times three using the left internal mammary and right saphenous vein. (N/A) TRANSESOPHAGEAL ECHOCARDIOGRAM (TEE) (N/A)   Total Length of Stay:  LOS: 3 days  Events:   No events Awaiting floor bed    BP (!) 138/107 (BP Location: Right Arm)   Pulse 83   Temp 98.2 F (36.8 C) (Oral)   Resp 20   Ht '5\' 4"'$  (1.626 m)   Wt 121.1 kg   SpO2 93%   BMI 45.83 kg/m          sodium chloride     lactated ringers Stopped (11/18/21 1338)    I/O last 3 completed shifts: In: 1566.7 [P.O.:650; I.V.:616.7; IV Piggyback:300] Out: 2400 [Urine:2170; Chest Tube:230]      Latest Ref Rng & Units 11/19/2021    4:07 AM 11/18/2021    3:41 AM 11/17/2021    4:19 PM  CBC  WBC 4.0 - 10.5 K/uL 10.0  11.5  7.4   Hemoglobin 12.0 - 15.0 g/dL 9.8  10.7  9.2   Hematocrit 36.0 - 46.0 % 29.0  31.9  27.5   Platelets 150 - 400 K/uL 101  104  PLATELET CLUMPS NOTED ON SMEAR, COUNT APPEARS DECREASED        Latest Ref Rng & Units 11/19/2021    4:07 AM 11/18/2021    3:41 AM 11/17/2021    4:19 PM  BMP  Glucose 70 - 99 mg/dL 183  146  168   BUN 8 - 23 mg/dL '10  9  9   '$ Creatinine 0.44 - 1.00 mg/dL 1.10  1.06  1.06   Sodium 135 - 145 mmol/L 134  135  138   Potassium 3.5 - 5.1 mmol/L 4.2  4.1  4.4   Chloride 98 - 111 mmol/L 101  104  107   CO2 22 - 32 mmol/L '27  24  25   '$ Calcium 8.9 - 10.3 mg/dL 9.7  9.5  8.8     ABG    Component Value Date/Time   PHART 7.355 11/17/2021 0018   PCO2ART 37.5 11/17/2021 0018   PO2ART 64 (L) 11/17/2021 0018   HCO3 22.3 11/17/2021 1436   TCO2 24 11/17/2021 1436   ACIDBASEDEF 4.0 (H) 11/17/2021 1436   O2SAT 60 11/17/2021 1436       Melodie Bouillon, MD 11/19/2021 4:31 PM

## 2021-11-19 NOTE — Evaluation (Signed)
Physical Therapy Evaluation Patient Details Name: Katelyn Kaufman MRN: [id number] DOB: [birth year] Today's Date: 11/19/2021  History of Present Illness  Pt is a 71 y.o. female admitted 11/16/21 with NSTEMI. LHC showed multivessel disease; s/p IABP insertion. S/p emergent CABGx3, TEE 6/26. PMH includes COPD, CAD, HTN, MI, DM2, OA, arthritis, back sx.   Clinical Impression  Pt presents with an overall decrease in functional mobility secondary to above. PTA, pt mod indep with SPC/RW, lives alone with sister next door and available to assist as needed upon return home. Initiated educ re: sternal precautions, positioning, activity recommendations and importance of mobility. Today, pt able to initiate transfer and gait training with RW, frequent minA for stability and walker management. Pt would benefit from continued acute PT services to maximize functional mobility and independence prior to d/c with HHPT services.     BP 136/84, HR 94, SpO2 100% on RA    Recommendations for follow up therapy are one component of a multi-disciplinary discharge planning process, led by the attending physician.  Recommendations may be updated based on patient status, additional functional criteria and insurance authorization.  Follow Up Recommendations Home health PT (TBD - may progress and not need)      Assistance Recommended at Discharge Intermittent Supervision/Assistance  Patient can return home with the following  A little help with walking and/or transfers;A little help with bathing/dressing/bathroom;Assistance with cooking/housework;Assist for transportation;Help with stairs or ramp for entrance    Equipment Recommendations  (TBD)  Recommendations for Other Services       Functional Status Assessment Patient has had a recent decline in their functional status and demonstrates the ability to make significant improvements in function in a reasonable and predictable amount of time.     Precautions /  Restrictions Precautions Precautions: Fall;Sternal      Mobility  Bed Mobility Overal bed mobility: Needs Assistance Bed Mobility: Supine to Sit     Supine to sit: Min assist, HOB elevated     General bed mobility comments: cues for sternal precautions, minA for trunk elevation    Transfers Overall transfer level: Needs assistance Equipment used: Rolling walker (2 wheels) Transfers: Sit to/from Stand Sit to Stand: Min assist           General transfer comment: repeated cues for hand placement, pt still pulling up on RW requiring minA to stabilize RW to allow trunk elevation    Ambulation/Gait Ambulation/Gait assistance: Min assist, +2 safety/equipment Gait Distance (Feet): 72 Feet Assistive device: Rolling walker (2 wheels) Gait Pattern/deviations: Step-to pattern, Step-through pattern, Decreased stride length, Trunk flexed Gait velocity: Decreased     General Gait Details: slow, unsteady, labored gait with RW and intermittent minA for stability and RW management; pt frequently holding onto front of walker with elbows resting against handles, repeated cues for upright posture, hand placement and maintaining closer proximity to RW, to which pt responds, "This is how I do it." chair follow by RN as pt declining further distance in hallway secondary to fatigue and dizziness  Stairs            Wheelchair Mobility    Modified Rankin (Stroke Patients Only)       Balance Overall balance assessment: Needs assistance   Sitting balance-Leahy Scale: Fair     Standing balance support: Reliant on assistive device for balance Standing balance-Leahy Scale: Poor  Pertinent Vitals/Pain Pain Assessment Pain Assessment: Faces Faces Pain Scale: Hurts even more Pain Location: sternal incision Pain Descriptors / Indicators: Discomfort, Grimacing, Guarding Pain Intervention(s): Monitored during session, Limited activity within  patient's tolerance    Home Living Family/patient expects to be discharged to:: Private residence Living Arrangements: Alone Available Help at Discharge: Family;Available PRN/intermittently Type of Home: House Home Access: Level entry       Home Layout: One level Home Equipment: Conservation officer, nature (2 wheels);Cane - single point Additional Comments: sister lives next door and can help when needed; able to stay overnight if needed too    Prior Function Prior Level of Function : Independent/Modified Independent             Mobility Comments: typically mod indep with SPC       Hand Dominance        Extremity/Trunk Assessment   Upper Extremity Assessment Upper Extremity Assessment: Generalized weakness    Lower Extremity Assessment Lower Extremity Assessment: Generalized weakness       Communication   Communication: No difficulties  Cognition Arousal/Alertness: Awake/alert Behavior During Therapy: WFL for tasks assessed/performed, Flat affect Overall Cognitive Status: Within Functional Limits for tasks assessed                                 General Comments: WFL for simple tasks, not formally assessed        General Comments General comments (skin integrity, edema, etc.): post-ambulation BP 136/84, HR 94, SpO2 100% on RA. educ re: sternal precautions, activity recommendations, importance of mobility. pt encouraged to walk at least 1x more today (walked with night shift this AM), pt reports, "I don't know about that, I shouldn't have even done that with you now." Max education/encouragement on importance of OOB mobility and walking    Exercises     Assessment/Plan    PT Assessment Patient needs continued PT services  PT Problem List Decreased strength;Decreased activity tolerance;Decreased balance;Decreased mobility;Decreased knowledge of use of DME;Decreased knowledge of precautions;Cardiopulmonary status limiting activity;Pain       PT  Treatment Interventions DME instruction;Gait training;Stair training;Functional mobility training;Therapeutic activities;Therapeutic exercise;Balance training;Patient/family education    PT Goals (Current goals can be found in the Care Plan section)  Acute Rehab PT Goals Patient Stated Goal: return home PT Goal Formulation: With patient Time For Goal Achievement: 12/03/21 Potential to Achieve Goals: Good    Frequency Min 3X/week     Co-evaluation               AM-PAC PT "6 Clicks" Mobility  Outcome Measure Help needed turning from your back to your side while in a flat bed without using bedrails?: A Lot Help needed moving from lying on your back to sitting on the side of a flat bed without using bedrails?: A Lot Help needed moving to and from a bed to a chair (including a wheelchair)?: A Little Help needed standing up from a chair using your arms (e.g., wheelchair or bedside chair)?: A Little Help needed to walk in hospital room?: A Little Help needed climbing 3-5 steps with a railing? : A Little 6 Click Score: 16    End of Session Equipment Utilized During Treatment: Gait belt Activity Tolerance: Patient tolerated treatment well;Patient limited by fatigue Patient left: in chair;with call bell/phone within reach;with nursing/sitter in room Nurse Communication: Mobility status PT Visit Diagnosis: Other abnormalities of gait and mobility (R26.89);Muscle weakness (generalized) (M62.81)  Time: 8889-1694 PT Time Calculation (min) (ACUTE ONLY): 25 min   Charges:   PT Evaluation $PT Eval Moderate Complexity: 1 Mod PT Treatments $Gait Training: 8-22 mins    Mabeline Caras, PT, DPT Acute Rehabilitation Services  Personal: [facility] Office: [place] 11/19/2021, 4:02 PM

## 2021-11-20 DIAGNOSIS — I249 Acute ischemic heart disease, unspecified: Secondary | ICD-10-CM

## 2021-11-20 LAB — BPAM RBC
Blood Product Expiration Date: 202307112359
Blood Product Expiration Date: 202307132359
ISSUE DATE / TIME: 202306262147
ISSUE DATE / TIME: 202306262147
Unit Type and Rh: 6200
Unit Type and Rh: 6200

## 2021-11-20 LAB — TYPE AND SCREEN
ABO/RH(D): A POS
Antibody Screen: NEGATIVE
Unit division: 0
Unit division: 0

## 2021-11-20 LAB — BASIC METABOLIC PANEL
Anion gap: 12 (ref 5–15)
BUN: 7 mg/dL — ABNORMAL LOW (ref 8–23)
CO2: 28 mmol/L (ref 22–32)
Calcium: 10.5 mg/dL — ABNORMAL HIGH (ref 8.9–10.3)
Chloride: 94 mmol/L — ABNORMAL LOW (ref 98–111)
Creatinine, Ser: 1.03 mg/dL — ABNORMAL HIGH (ref 0.44–1.00)
GFR, Estimated: 58 mL/min — ABNORMAL LOW (ref >60)
Glucose, Bld: 170 mg/dL — ABNORMAL HIGH (ref 70–99)
Potassium: 4 mmol/L (ref 3.5–5.1)
Sodium: 134 mmol/L — ABNORMAL LOW (ref 135–145)

## 2021-11-20 LAB — GLUCOSE, CAPILLARY
Glucose-Capillary: 179 mg/dL — ABNORMAL HIGH (ref 70–99)
Glucose-Capillary: 165 mg/dL — ABNORMAL HIGH (ref 70–99)
Glucose-Capillary: 141 mg/dL — ABNORMAL HIGH (ref 70–99)
Glucose-Capillary: 150 mg/dL — ABNORMAL HIGH (ref 70–99)

## 2021-11-20 MED ORDER — CLOPIDOGREL BISULFATE 75 MG PO TABS
75.0000 mg | ORAL_TABLET | Freq: Every day | ORAL | Status: DC
  Administered 2021-11-21 – 2021-11-23 (×3): 75 mg via ORAL
  Filled 2021-11-20 (×3): qty 1

## 2021-11-20 MED ORDER — METFORMIN HCL 500 MG PO TABS
1000.0000 mg | ORAL_TABLET | Freq: Two times a day (BID) | ORAL | Status: DC
  Administered 2021-11-20 – 2021-11-23 (×7): 1000 mg via ORAL
  Filled 2021-11-20 (×7): qty 2

## 2021-11-20 MED ORDER — INSULIN DETEMIR 100 UNIT/ML ~~LOC~~ SOLN
25.0000 [IU] | Freq: Two times a day (BID) | SUBCUTANEOUS | Status: DC
  Administered 2021-11-20 – 2021-11-23 (×7): 25 [IU] via SUBCUTANEOUS
  Filled 2021-11-20 (×9): qty 0.25

## 2021-11-20 MED ORDER — GLIMEPIRIDE 4 MG PO TABS
4.0000 mg | ORAL_TABLET | Freq: Every day | ORAL | Status: DC
Start: 2021-11-20 — End: 2021-11-23
  Administered 2021-11-20 – 2021-11-23 (×4): 4 mg via ORAL
  Filled 2021-11-20 (×5): qty 1

## 2021-11-20 MED ORDER — ASPIRIN 81 MG PO TBEC
81.0000 mg | DELAYED_RELEASE_TABLET | Freq: Every day | ORAL | Status: DC
  Administered 2021-11-21 – 2021-11-23 (×3): 81 mg via ORAL
  Filled 2021-11-20 (×3): qty 1

## 2021-11-20 NOTE — Progress Notes (Signed)
Patient ID: Katelyn Kaufman, female   DOB: 05-Dec-1950, 71 y.o.   MRN: [id number] TCTS Evening Rounds:  Hemodynamically stable in sinus rhythm.   Still making a lot of urine from lasix and metolazone yesterday. -1200 cc so far today.  She has bed on 4E this afternoon.

## 2021-11-20 NOTE — Progress Notes (Signed)
4 Days Post-Op Procedure(s) (LRB): CORONARY ARTERY BYPASS GRAFTING (CABG) times three using the left internal mammary and right saphenous vein. (N/A) TRANSESOPHAGEAL ECHOCARDIOGRAM (TEE) (N/A) Subjective:  Only complaint is that she was up all night peeing from diuretic. -3575 cc yesterday. Refused diuretic this am. Wt down 10 lbs.  Her bedside chair tipped over forward this am when she scooted forward. She did not hit head and was able to stand up with help. She ambulated one lap this am.  Objective: Vital signs in last 24 hours: Temp:  [97.7 F (36.5 C)-99 F (37.2 C)] 98.1 F (36.7 C) (06/30 0650) Pulse Rate:  [74-102] 81 (06/30 0700) Cardiac Rhythm: Normal sinus rhythm (06/29 1600) Resp:  [10-32] 16 (06/30 0719) BP: (108-164)/(61-146) 125/90 (06/30 0719) SpO2:  [90 %-99 %] 93 % (06/30 0700) Weight:  [116.9 kg] 116.9 kg (06/30 0500)  Hemodynamic parameters for last 24 hours:    Intake/Output from previous day: 06/29 0701 - 06/30 0700 In: 300 [P.O.:300] Out: 3875 [Urine:3875] Intake/Output this shift: No intake/output data recorded.  General appearance: alert and cooperative Neurologic: intact Heart: regular rate and rhythm, S1, S2 normal, no murmur Lungs: clear to auscultation bilaterally Extremities: edema resolved with diuresis. Wound: dressing dry  Lab Results: Recent Labs    11/18/21 0341 11/19/21 0407  WBC 11.5* 10.0  HGB 10.7* 9.8*  HCT 31.9* 29.0*  PLT 104* 101*   BMET:  Recent Labs    11/19/21 0407 11/20/21 0143  NA 134* 134*  K 4.2 4.0  CL 101 94*  CO2 27 28  GLUCOSE 183* 170*  BUN 10 7*  CREATININE 1.10* 1.03*  CALCIUM 9.7 10.5*    PT/INR: No results for input(s): "LABPROT", "INR" in the last 72 hours. ABG    Component Value Date/Time   PHART 7.355 11/17/2021 0018   HCO3 22.3 11/17/2021 1436   TCO2 24 11/17/2021 1436   ACIDBASEDEF 4.0 (H) 11/17/2021 1436   O2SAT 60 11/17/2021 1436   CBG (last 3)  Recent Labs    11/19/21 1609  11/19/21 2154 11/20/21 0651  GLUCAP 226* 192* 179*    Assessment/Plan: S/P Procedure(s) (LRB): CORONARY ARTERY BYPASS GRAFTING (CABG) times three using the left internal mammary and right saphenous vein. (N/A) TRANSESOPHAGEAL ECHOCARDIOGRAM (TEE) (N/A)  POD 3  Hemodynamically stable in sinus rhythm. Continue Lopressor.  Big diuresis yesterday and wt down 10 lbs. She is 5.5 lbs over preop but will hold off on further diuresis for now since edema resolved and she is still making a lot of urine.  DM: glucose under adequate control and she is eating so will resume Amaryl and Metformin. Decrease Levemir dose.  Awaiting bed on 4E. Continue IS, ambulation.  DC pacing wires and resume Plavix with ASA tomorrow.   LOS: 4 days    Gaye Pollack 11/20/2021

## 2021-11-20 NOTE — Progress Notes (Signed)
Pt sister Kennyth Lose notified that pt slipped out the chair this morning and no injuries.

## 2021-11-20 NOTE — Progress Notes (Signed)
At shift change/ beside handoff. Noted pt to be on the floor attempting to get up with chair tipped forward. Pt attempting to reposition herself in chair and chair tipped forward causing pt to fall on the floor.  Assessment completed. Moves all 4 extremities and denies any pain or limited movement. Denies hitting her head. Vs obtained and stable. Dr. Cyndia Bent notified. Will continue to monitor

## 2021-11-20 NOTE — Progress Notes (Signed)
CARDIAC REHAB PHASE I   PRE:  Rate/Rhythm: 80 Sr  BP:  Sitting: 137/80      SaO2: 97 RA  MODE:  Ambulation: 120 ft   POST:  Rate/Rhythm: 96 Sr  BP:  Sitting: 154/99      SaO2: 93 RA   Pt ambulated in hall with max. encouragement. Pt tolerated with several sitting breaks due to fatigue and mild SOB. Pt noted some chest tenderness with movement. RN will medicate after walk.  Pt back to room in chair with call bell in reach and floor pads.Stressed importance of continued walks and IS use.  All question and concerns addressed. Will continue to follow.   [id number]  Vanessa Barbara, RN BSN 11/20/2021 1:59 PM

## 2021-11-21 LAB — GLUCOSE, CAPILLARY
Glucose-Capillary: 123 mg/dL — ABNORMAL HIGH (ref 70–99)
Glucose-Capillary: 132 mg/dL — ABNORMAL HIGH (ref 70–99)
Glucose-Capillary: 225 mg/dL — ABNORMAL HIGH (ref 70–99)
Glucose-Capillary: 130 mg/dL — ABNORMAL HIGH (ref 70–99)
Glucose-Capillary: 116 mg/dL — ABNORMAL HIGH (ref 70–99)

## 2021-11-21 MED ORDER — FUROSEMIDE 40 MG PO TABS
40.0000 mg | ORAL_TABLET | Freq: Every day | ORAL | Status: DC
  Administered 2021-11-21: 40 mg via ORAL
  Filled 2021-11-21: qty 1

## 2021-11-21 MED ORDER — POTASSIUM CHLORIDE CRYS ER 20 MEQ PO TBCR
20.0000 meq | EXTENDED_RELEASE_TABLET | Freq: Every day | ORAL | Status: DC
  Administered 2021-11-21: 20 meq via ORAL
  Filled 2021-11-21: qty 1

## 2021-11-21 MED ORDER — SORBITOL 70 % SOLN
30.0000 mL | Freq: Once | Status: AC
  Administered 2021-11-21: 30 mL via ORAL
  Filled 2021-11-21: qty 30

## 2021-11-21 NOTE — Progress Notes (Signed)
CARDIAC REHAB PHASE I   Offered to walk with pt. Pt in BR having BM. Waited for pt to finish. Pt assisted with getting cleaned up. Strongly encouraged ambulation while exiting BR. Pt needing a seated rest break. Before we were able to walk, pt needing to use BR again. BR call light in reach. Stressed importance of ambulation. Pt stating she is "too weak". RN aware. Will continue to follow.  [id number] Rufina Falco, RN BSN 11/21/2021 11:39 AM

## 2021-11-21 NOTE — Progress Notes (Addendum)
      [address]       [place] [address]             [phone]        5 Days Post-Op Procedure(s) (LRB): CORONARY ARTERY BYPASS GRAFTING (CABG) times three using the left internal mammary and right saphenous vein. (N/A) TRANSESOPHAGEAL ECHOCARDIOGRAM (TEE) (N/A)  Subjective: Patient "walked a little" yesterday. She has not had a bowel movement yet.  Objective: Vital signs in last 24 hours: Temp:  [97.7 F (36.5 C)-98.6 F (37 C)] 97.7 F (36.5 C) (07/01 0320) Pulse Rate:  [78-98] 84 (07/01 0320) Cardiac Rhythm: Normal sinus rhythm (06/30 1949) Resp:  [12-26] 18 (07/01 0320) BP: (70-162)/(41-110) 148/75 (07/01 0320) SpO2:  [81 %-100 %] 92 % (07/01 0320) Weight:  [956 kg] 114 kg (07/01 0600)  Pre op weight 114.4 kg Current Weight  11/21/21 114 kg       Intake/Output from previous day: 06/30 0701 - 07/01 0700 In: -  Out: 1200 [Urine:1200]   Physical Exam:  Cardiovascular: RRR. Pulmonary: Slightly diminished bibasilar breath sounds Abdomen: Soft, non tender, bowel sounds present. Extremities: Mild bilateral lower extremity edema. Wounds: Clean and dry.  No erythema or signs of infection.  Lab Results: CBC: Recent Labs    11/19/21 0407  WBC 10.0  HGB 9.8*  HCT 29.0*  PLT 101*   BMET:  Recent Labs    11/19/21 0407 11/20/21 0143  NA 134* 134*  K 4.2 4.0  CL 101 94*  CO2 27 28  GLUCOSE 183* 170*  BUN 10 7*  CREATININE 1.10* 1.03*  CALCIUM 9.7 10.5*    PT/INR:  Lab Results  Component Value Date   INR 1.7 (H) 11/17/2021   INR 1.1 11/16/2021   ABG:  INR: Will add last result for INR, ABG once components are confirmed Will add last 4 CBG results once components are confirmed  Assessment/Plan:  1. CV - S/p NSTEMI. SR. On Plavix 75 mg daily and Lopressor 25 mg bid. Will restart Losartan in am if BP allows. 2.  Pulmonary - On room air. Encourage incentive spirometer 3. Volume Overload - Lasix 40 mg daily 4.  Expected  post op acute blood loss anemia - last H and H 9.8 and 29 5. DM-CBGs 141/150/123. On Insulin, Glimepiride 4 mg daily and Metformin 1000 mg bid. Pre op HGA1C 8.9. She will need close medical follow up with her doctor after discharge 6. Mild thrombocytopenia-Last platelets 101,000 7. LOC constipation 8. Deconditioned-ambulate tid, home PT  Donielle M ZimmermanPA-C 7:30 AM    Chart reviewed, patient examined, agree with above. She looks good overall. -1200 cc yesterday and wt down to preop.  No BM yet but passing gas.

## 2021-11-21 NOTE — Progress Notes (Signed)
Patient up to chair after assistance to rest room. Call bell with in reach. Toshi Ishii, Bettina Gavia rN

## 2021-11-22 DIAGNOSIS — I249 Acute ischemic heart disease, unspecified: Secondary | ICD-10-CM

## 2021-11-22 LAB — GLUCOSE, CAPILLARY
Glucose-Capillary: 97 mg/dL (ref 70–99)
Glucose-Capillary: 114 mg/dL — ABNORMAL HIGH (ref 70–99)
Glucose-Capillary: 160 mg/dL — ABNORMAL HIGH (ref 70–99)
Glucose-Capillary: 118 mg/dL — ABNORMAL HIGH (ref 70–99)

## 2021-11-22 LAB — CBC
HCT: 33.3 % — ABNORMAL LOW (ref 36.0–46.0)
Hemoglobin: 11.9 g/dL — ABNORMAL LOW (ref 12.0–15.0)
MCH: 31.2 pg (ref 26.0–34.0)
MCHC: 35.7 g/dL (ref 30.0–36.0)
MCV: 87.4 fL (ref 80.0–100.0)
Platelets: 194 10*3/uL (ref 150–400)
RBC: 3.81 MIL/uL — ABNORMAL LOW (ref 3.87–5.11)
RDW: 13.8 % (ref 11.5–15.5)
WBC: 7.9 10*3/uL (ref 4.0–10.5)
nRBC: 0 % (ref 0.0–0.2)

## 2021-11-22 MED ORDER — LOSARTAN POTASSIUM 25 MG PO TABS
25.0000 mg | ORAL_TABLET | Freq: Every day | ORAL | Status: DC
  Administered 2021-11-22 – 2021-11-23 (×2): 25 mg via ORAL
  Filled 2021-11-22 (×2): qty 1

## 2021-11-22 NOTE — Progress Notes (Signed)
Patient assisted to the chair for lunch. Call bell within reach. [name], Bettina Gavia RN

## 2021-11-22 NOTE — Progress Notes (Signed)
Patient with an episode of nausea. Patient with BM today. Zofran given as ordered as needed for nausea. Natali Lavallee, Bettina Gavia RN

## 2021-11-22 NOTE — Progress Notes (Signed)
Patient with episodes of nausea again today, patient states she was eating and then got nauseated. Zofran given as ordered as needed for nausea. Patient with BM today. Patient currently resting in bed call bell within reach. Clearance Chenault, Bettina Gavia RN

## 2021-11-22 NOTE — Progress Notes (Signed)
Encouraged patient ambulation, patient declines at this time. Will continue to encourage. Blimy Napoleon, Bettina Gavia RN

## 2021-11-22 NOTE — Progress Notes (Addendum)
      [address]       [place] [address]             [phone]        6 Days Post-Op Procedure(s) (LRB): CORONARY ARTERY BYPASS GRAFTING (CABG) times three using the left internal mammary and right saphenous vein. (N/A) TRANSESOPHAGEAL ECHOCARDIOGRAM (TEE) (N/A)  Subjective: Patient did not walk much yesterday. She did have a bowel movement.  Objective: Vital signs in last 24 hours: Temp:  [97.5 F (36.4 C)-98.4 F (36.9 C)] 98.1 F (36.7 C) (07/02 0731) Pulse Rate:  [81-100] 88 (07/02 0731) Cardiac Rhythm: Normal sinus rhythm (07/01 1952) Resp:  [17-20] 17 (07/02 0731) BP: (128-166)/(80-95) 158/90 (07/02 0731) SpO2:  [90 %-97 %] 97 % (07/02 0731) Weight:  [113.9 kg] 113.9 kg (07/02 0603)  Pre op weight 114.4 kg Current Weight  11/22/21 113.9 kg       Intake/Output from previous day: 07/01 0701 - 07/02 0700 In: 940 [P.O.:940] Out: 1400 [Urine:1400]   Physical Exam:  Cardiovascular: RRR. Pulmonary: Slightly diminished bibasilar breath sounds Abdomen: Soft, non tender, bowel sounds present. Extremities: Trace bilateral lower extremity edema. Wounds: Clean and dry.  No erythema or signs of infection.  Lab Results: CBC: Recent Labs    11/22/21 0248  WBC 7.9  HGB 11.9*  HCT 33.3*  PLT 194    BMET:  Recent Labs    11/20/21 0143  NA 134*  K 4.0  CL 94*  CO2 28  GLUCOSE 170*  BUN 7*  CREATININE 1.03*  CALCIUM 10.5*     PT/INR:  Lab Results  Component Value Date   INR 1.7 (H) 11/17/2021   INR 1.1 11/16/2021   ABG:  INR: Will add last result for INR, ABG once components are confirmed Will add last 4 CBG results once components are confirmed  Assessment/Plan:  1. CV - S/p NSTEMI. SR. On Plavix 75 mg daily and Lopressor 25 mg bid. Will restart low dose Losartan for better BP control. 2.  Pulmonary - On room air. Encourage incentive spirometer and flutter valve 3. Volume Overload - Lasix 40 mg daily 4.  Expected post  op acute blood loss anemia - H and H this am stable at 11.9 and 33.3 5. DM-CBGs 130/116/97. On Insulin, Glimepiride 4 mg daily and Metformin 1000 mg bid. Pre op HGA1C 8.9. She will need close medical follow up with her doctor after discharge 6. Mild thrombocytopenia resolved-Platelets this am up to 194,000 7. Deconditioned-ambulate tid, home PT. Will discharge once ambulatory status improves-possibly in am  Katelyn M ZimmermanPA-C 7:43 AM    Chart reviewed, patient examined, agree with above. She is progressing slowly due to morbid obesity and bad back but looks good overall. Wt is below preop and no edema. DC lasix and KCL. Continue ambulation. Possibly home in am.

## 2021-11-22 NOTE — Progress Notes (Signed)
Mobility Specialist Progress Note    11/22/21 1553  Mobility  Activity Ambulated with assistance in hallway  Level of Assistance +2 (takes two people) (chair follow)  Assistive Device Front wheel walker  Distance Ambulated (ft) 85 ft (10+20+55)  Activity Response Tolerated fair  $Mobility charge 1 Mobility   During Mobility: 107 HR Post-Mobility: 100 HR  Pt received in chair requesting to attempt BM. Pt ModA to stand and needing frequent cues to maintain sternal precautions. Took a seated rest break after coming out of BR. Took another seated rest break in the hall. Rolled back in chair with call bell in reach.   Hildred Alamin Mobility Specialist

## 2021-11-23 LAB — POCT I-STAT 7, (LYTES, BLD GAS, ICA,H+H)
Acid-Base Excess: 2 mmol/L (ref 0.0–2.0)
Acid-Base Excess: 1 mmol/L (ref 0.0–2.0)
Acid-base deficit: 1 mmol/L (ref 0.0–2.0)
Bicarbonate: 26.5 mmol/L (ref 20.0–28.0)
Bicarbonate: 24.8 mmol/L (ref 20.0–28.0)
Bicarbonate: 23.2 mmol/L (ref 20.0–28.0)
Calcium, Ion: 1.14 mmol/L — ABNORMAL LOW (ref 1.15–1.40)
Calcium, Ion: 1.18 mmol/L (ref 1.15–1.40)
Calcium, Ion: 1.22 mmol/L (ref 1.15–1.40)
HCT: 23 % — ABNORMAL LOW (ref 36.0–46.0)
HCT: 21 % — ABNORMAL LOW (ref 36.0–46.0)
HCT: 20 % — ABNORMAL LOW (ref 36.0–46.0)
Hemoglobin: 7.8 g/dL — ABNORMAL LOW (ref 12.0–15.0)
Hemoglobin: 7.1 g/dL — ABNORMAL LOW (ref 12.0–15.0)
Hemoglobin: 6.8 g/dL — CL (ref 12.0–15.0)
O2 Saturation: 100 %
O2 Saturation: 100 %
O2 Saturation: 100 %
Potassium: 4.1 mmol/L (ref 3.5–5.1)
Potassium: 4.5 mmol/L (ref 3.5–5.1)
Potassium: 3.7 mmol/L (ref 3.5–5.1)
Sodium: 138 mmol/L (ref 135–145)
Sodium: 137 mmol/L (ref 135–145)
Sodium: 139 mmol/L (ref 135–145)
TCO2: 28 mmol/L (ref 22–32)
TCO2: 26 mmol/L (ref 22–32)
TCO2: 24 mmol/L (ref 22–32)
pCO2 arterial: 40.6 mmHg (ref 32–48)
pCO2 arterial: 34.3 mmHg (ref 32–48)
pCO2 arterial: 37.7 mmHg (ref 32–48)
pH, Arterial: 7.424 (ref 7.35–7.45)
pH, Arterial: 7.467 — ABNORMAL HIGH (ref 7.35–7.45)
pH, Arterial: 7.398 (ref 7.35–7.45)
pO2, Arterial: 469 mmHg — ABNORMAL HIGH (ref 83–108)
pO2, Arterial: 366 mmHg — ABNORMAL HIGH (ref 83–108)
pO2, Arterial: 304 mmHg — ABNORMAL HIGH (ref 83–108)

## 2021-11-23 LAB — POCT I-STAT, CHEM 8
BUN: 9 mg/dL (ref 8–23)
BUN: 9 mg/dL (ref 8–23)
BUN: 10 mg/dL (ref 8–23)
Calcium, Ion: 1.2 mmol/L (ref 1.15–1.40)
Calcium, Ion: 1.21 mmol/L (ref 1.15–1.40)
Calcium, Ion: 1.22 mmol/L (ref 1.15–1.40)
Chloride: 102 mmol/L (ref 98–111)
Chloride: 100 mmol/L (ref 98–111)
Chloride: 100 mmol/L (ref 98–111)
Creatinine, Ser: 0.8 mg/dL (ref 0.44–1.00)
Creatinine, Ser: 0.7 mg/dL (ref 0.44–1.00)
Creatinine, Ser: 0.7 mg/dL (ref 0.44–1.00)
Glucose, Bld: 136 mg/dL — ABNORMAL HIGH (ref 70–99)
Glucose, Bld: 129 mg/dL — ABNORMAL HIGH (ref 70–99)
Glucose, Bld: 140 mg/dL — ABNORMAL HIGH (ref 70–99)
HCT: 23 % — ABNORMAL LOW (ref 36.0–46.0)
HCT: 22 % — ABNORMAL LOW (ref 36.0–46.0)
HCT: 21 % — ABNORMAL LOW (ref 36.0–46.0)
Hemoglobin: 7.8 g/dL — ABNORMAL LOW (ref 12.0–15.0)
Hemoglobin: 7.5 g/dL — ABNORMAL LOW (ref 12.0–15.0)
Hemoglobin: 7.1 g/dL — ABNORMAL LOW (ref 12.0–15.0)
Potassium: 4.1 mmol/L (ref 3.5–5.1)
Potassium: 4.2 mmol/L (ref 3.5–5.1)
Potassium: 3.7 mmol/L (ref 3.5–5.1)
Sodium: 138 mmol/L (ref 135–145)
Sodium: 136 mmol/L (ref 135–145)
Sodium: 139 mmol/L (ref 135–145)
TCO2: 25 mmol/L (ref 22–32)
TCO2: 26 mmol/L (ref 22–32)
TCO2: 22 mmol/L (ref 22–32)

## 2021-11-23 LAB — POCT I-STAT EG7
Acid-Base Excess: 0 mmol/L (ref 0.0–2.0)
Bicarbonate: 24.2 mmol/L (ref 20.0–28.0)
Calcium, Ion: 1.19 mmol/L (ref 1.15–1.40)
HCT: 23 % — ABNORMAL LOW (ref 36.0–46.0)
Hemoglobin: 7.8 g/dL — ABNORMAL LOW (ref 12.0–15.0)
O2 Saturation: 85 %
Potassium: 4.2 mmol/L (ref 3.5–5.1)
Sodium: 138 mmol/L (ref 135–145)
TCO2: 25 mmol/L (ref 22–32)
pCO2, Ven: 39.1 mmHg — ABNORMAL LOW (ref 44–60)
pH, Ven: 7.4 (ref 7.25–7.43)
pO2, Ven: 50 mmHg — ABNORMAL HIGH (ref 32–45)

## 2021-11-23 LAB — POCT ACTIVATED CLOTTING TIME: Activated Clotting Time: >1000 seconds

## 2021-11-23 LAB — GLUCOSE, CAPILLARY
Glucose-Capillary: 95 mg/dL (ref 70–99)
Glucose-Capillary: 129 mg/dL — ABNORMAL HIGH (ref 70–99)

## 2021-11-23 MED ORDER — LOSARTAN POTASSIUM 25 MG PO TABS: 25.0000 mg | ORAL_TABLET | Freq: Every day | ORAL | 1 refills | Status: DC

## 2021-11-23 MED ORDER — OXYCODONE-ACETAMINOPHEN 10-325 MG PO TABS: 1.0000 | ORAL_TABLET | ORAL | 0 refills | Status: DC | PRN

## 2021-11-23 MED ORDER — METOPROLOL TARTRATE 25 MG PO TABS: 25.0000 mg | ORAL_TABLET | Freq: Two times a day (BID) | ORAL | 1 refills | Status: DC

## 2021-11-23 MED ORDER — ASPIRIN 81 MG PO TBEC: 81.0000 mg | DELAYED_RELEASE_TABLET | Freq: Every day | ORAL | 12 refills | Status: DC

## 2021-11-23 NOTE — Progress Notes (Addendum)
      [address]       [place] [address]             [phone]        7 Days Post-Op Procedure(s) (LRB): CORONARY ARTERY BYPASS GRAFTING (CABG) times three using the left internal mammary and right saphenous vein. (N/A) TRANSESOPHAGEAL ECHOCARDIOGRAM (TEE) (N/A)  Subjective: Patient just waking up this am. She has some incisional, sternal pain;otherwise, no complaint.  Objective: Vital signs in last 24 hours: Temp:  [97.6 F (36.4 C)-98.1 F (36.7 C)] 98 F (36.7 C) (07/03 0353) Pulse Rate:  [86-98] 86 (07/03 0353) Cardiac Rhythm: Normal sinus rhythm (07/02 1939) Resp:  [17-18] 18 (07/03 0353) BP: (135-158)/(70-92) 138/92 (07/03 0353) SpO2:  [92 %-97 %] 96 % (07/03 0353) Weight:  [110.3 kg] 110.3 kg (07/03 0550)  Pre op weight 114.4 kg Current Weight  11/23/21 110.3 kg       Intake/Output from previous day: 07/02 0701 - 07/03 0700 In: 361 [P.O.:361] Out: 100 [Urine:100]   Physical Exam:  Cardiovascular: RRR. Pulmonary: Mostly clear Abdomen: Soft, non tender, bowel sounds present. Extremities: Trace bilateral lower extremity edema. Wounds: Clean and dry.  No erythema or signs of infection.  Lab Results: CBC: Recent Labs    11/22/21 0248  WBC 7.9  HGB 11.9*  HCT 33.3*  PLT 194    BMET:  No results for input(s): "NA", "K", "CL", "CO2", "GLUCOSE", "BUN", "CREATININE", "CALCIUM" in the last 72 hours.   PT/INR:  Lab Results  Component Value Date   INR 1.7 (H) 11/17/2021   INR 1.1 11/16/2021   ABG:  INR: Will add last result for INR, ABG once components are confirmed Will add last 4 CBG results once components are confirmed  Assessment/Plan:  1. CV - S/p NSTEMI. SR. On Plavix 75 mg daily and Lopressor 25 mg bid and Losartan 25 mg daily. 2.  Pulmonary - On room air. Encourage incentive spirometer and flutter valve 3.  Expected post op acute blood loss anemia - H and H this am stable at 11.9 and 33.3 4. DM-CBGs 160/118/95.  On Insulin, Glimepiride 4 mg daily and Metformin 1000 mg bid. Pre op HGA1C 8.9. She will need close medical follow up with her doctor after discharge 5. Mild thrombocytopenia resolved-Platelets this am up to 194,000 6. Deconditioned-ambulate tid, home PT. Hope to discharge 7. Sutures to remain;will remove in the office  Donielle M ZimmermanPA-C 6:56 AM     Chart reviewed, patient examined, agree with above. She has been stable in sinus rhythm and ambulating about like she did preop. She would like to go home and has her two sisters at home to help her.

## 2021-11-23 NOTE — TOC Transition Note (Signed)
Transition of Care [facility]) - CM/SW Discharge Note   Patient Details  Name: Katelyn Kaufman MRN: [id number] Date of Birth: 01-29-1951  Transition of Care [facility]) CM/SW Contact:  Cyndi Bender, RN Phone Number: 11/23/2021, 9:39 AM   Clinical Narrative:     Patient stable for discharge. Orders for Home health PT. Spoke to patient and offered choice. Patient defers to [facility] to find highly rated agency. Tommi Rumps with [facility] accepted referral. Patient states she will call her ride. Address, Phone number and PCP verified.   Final next level of care: [place] Barriers to Discharge: Barriers Resolved   Patient Goals and CMS Choice Patient states their goals for this hospitalization and ongoing recovery are:: return home CMS Medicare.gov Compare Post Acute Care list provided to:: Patient Choice offered to / list presented to : Patient  Discharge Placement                       Discharge Plan and Services                          HH Arranged: PT [facility] Agency: [place] [facility] Agency Contacted: 11/23/21 Time [place]: [phone] Representative spoke with at [place]: [place] ([place]) Interventions     Readmission Risk Interventions    11/23/2021    9:39 AM  Readmission Risk Prevention Plan  Post Dischage Appt Complete  Medication Screening Complete  Transportation Screening Complete

## 2021-11-23 NOTE — Progress Notes (Signed)
Patient and family given discharge instructions medication list, prescriptions sent to personal pharmacy. and follow up appointments. All questions answered IV and tele were dcd will discharge home as ordered. Apoorva Bugay, Bettina Gavia RN

## 2021-11-23 NOTE — Progress Notes (Signed)
CARDIOLOGY RECOMMENDATIONS:  Discharge is anticipated in the next 48 hours. Recommendations for medications and follow up:  Discharge Medications: Continue medications as they are currently listed in the [facility]. Exceptions to the above: none  Follow Up: The patient's Primary Cardiologist is Rozann Lesches, MD  Follow up in the office in 2 week(s).  Signed,  Janet Decesare [place], MD  9:57 AM 11/23/2021  [facility]

## 2021-11-23 NOTE — Progress Notes (Signed)
CARDIAC REHAB PHASE I   PRE:  Rate/Rhythm: 96 SR  BP:  Sitting: 125/64      SaO2: 95  MODE:  Ambulation: 200 ft   POST:  Rate/Rhythm: 112 ST  BP:  Sitting: 138/92      SaO2: 94 RA   Pt ambulated in hall with front wheel walker chair follow for several seated rest breaks. Pt has improved tolerance with no SOB. Pt back to room in chair. Pt D/C home education including IS use, restrictions, exercise guidelines, nutrition, diabetic diet, smoking handout, site care CRP2 referral AP.   [id number]  Vanessa Barbara, RN BSN 11/23/2021 9:14 AM

## 2021-11-23 NOTE — Progress Notes (Signed)
PT Cancellation Note  Patient Details Name: Katelyn Kaufman MRN: [id number] DOB: 10-22-1950   Cancelled Treatment:    Reason Eval/Treat Not Completed: Other (comment) (Declined as she had walked this am and is going home today per pt.)   Alvira Philips 11/23/2021, 12:07 PM Sanita Estrada M,PT [place]

## 2021-11-23 NOTE — Progress Notes (Signed)
Attempted to ambulate pt but she refused, dicussed importance of ambulation to pt she continues to refuse

## 2021-11-23 NOTE — Care Management Important Message (Signed)
Important Message  Patient Details  Name: Katelyn Kaufman MRN: [id number] Date of Birth: 07/15/1950   Medicare Important Message Given:  Yes     Shelda Altes 11/23/2021, 9:38 AM

## 2021-11-26 ENCOUNTER — Encounter (INDEPENDENT_AMBULATORY_CARE_PROVIDER_SITE_OTHER): Payer: Self-pay

## 2021-11-26 DIAGNOSIS — Z4802 Encounter for removal of sutures: Secondary | ICD-10-CM

## 2021-11-30 DIAGNOSIS — Z951 Presence of aortocoronary bypass graft: Secondary | ICD-10-CM | POA: Diagnosis not present

## 2021-11-30 DIAGNOSIS — Z48812 Encounter for surgical aftercare following surgery on the circulatory system: Secondary | ICD-10-CM | POA: Diagnosis not present

## 2021-12-02 ENCOUNTER — Encounter: Payer: Self-pay | Admitting: Physician Assistant

## 2021-12-02 ENCOUNTER — Ambulatory Visit: Payer: Medicare HMO | Admitting: Physician Assistant

## 2021-12-02 VITALS — BP 111/67 | HR 85 | Ht 64.0 in | Wt 254.0 lb

## 2021-12-02 DIAGNOSIS — E118 Type 2 diabetes mellitus with unspecified complications: Secondary | ICD-10-CM

## 2021-12-02 DIAGNOSIS — I10 Essential (primary) hypertension: Secondary | ICD-10-CM | POA: Diagnosis not present

## 2021-12-02 DIAGNOSIS — Z794 Long term (current) use of insulin: Secondary | ICD-10-CM

## 2021-12-02 DIAGNOSIS — I2510 Atherosclerotic heart disease of native coronary artery without angina pectoris: Secondary | ICD-10-CM

## 2021-12-02 DIAGNOSIS — E785 Hyperlipidemia, unspecified: Secondary | ICD-10-CM

## 2021-12-02 NOTE — Patient Instructions (Addendum)
Medication Instructions:  Your physician recommends that you continue on your current medications as directed. Please refer to the Current Medication list given to you today.  *If you need a refill on your cardiac medications before your next appointment, please call your pharmacy*   Lab Work: None If you have labs (blood work) drawn today and your tests are completely normal, you will receive your results only by: Impact (if you have MyChart) OR A paper copy in the mail If you have any lab test that is abnormal or we need to change your treatment, we will call you to review the results.   Follow-Up: At [facility], you and your health needs are our priority.  As part of our continuing mission to provide you with exceptional heart care, we have created designated Provider Care Teams.  These Care Teams include your primary Cardiologist (physician) and Advanced Practice Providers (APPs -  Physician Assistants and Nurse Practitioners) who all work together to provide you with the care you need, when you need it.  Your next appointment:   03/03/22

## 2021-12-09 ENCOUNTER — Ambulatory Visit: Payer: Medicare HMO

## 2021-12-10 ENCOUNTER — Encounter (HOSPITAL_COMMUNITY): Payer: Self-pay | Admitting: Hematology

## 2021-12-10 ENCOUNTER — Inpatient Hospital Stay (HOSPITAL_COMMUNITY): Payer: Medicare HMO

## 2021-12-10 ENCOUNTER — Inpatient Hospital Stay (HOSPITAL_COMMUNITY): Payer: Medicare HMO | Attending: Hematology | Admitting: Hematology

## 2021-12-10 DIAGNOSIS — Z6841 Body Mass Index (BMI) 40.0 and over, adult: Secondary | ICD-10-CM | POA: Diagnosis not present

## 2021-12-10 DIAGNOSIS — C50412 Malignant neoplasm of upper-outer quadrant of left female breast: Secondary | ICD-10-CM | POA: Insufficient documentation

## 2021-12-10 DIAGNOSIS — Z951 Presence of aortocoronary bypass graft: Secondary | ICD-10-CM | POA: Insufficient documentation

## 2021-12-10 DIAGNOSIS — Z170 Estrogen receptor positive status [ER+]: Secondary | ICD-10-CM

## 2021-12-10 DIAGNOSIS — E119 Type 2 diabetes mellitus without complications: Secondary | ICD-10-CM | POA: Insufficient documentation

## 2021-12-10 DIAGNOSIS — F17210 Nicotine dependence, cigarettes, uncomplicated: Secondary | ICD-10-CM | POA: Diagnosis not present

## 2021-12-10 DIAGNOSIS — I10 Essential (primary) hypertension: Secondary | ICD-10-CM | POA: Insufficient documentation

## 2021-12-10 DIAGNOSIS — Z809 Family history of malignant neoplasm, unspecified: Secondary | ICD-10-CM | POA: Diagnosis not present

## 2021-12-10 LAB — VITAMIN D 25 HYDROXY (VIT D DEFICIENCY, FRACTURES): Vit D, 25-Hydroxy: 26.71 ng/mL — ABNORMAL LOW (ref 30–100)

## 2021-12-10 NOTE — Progress Notes (Signed)
[name] [address], [place] [address]   Patient Care Team: Leeanne Rio, MD as PCP - General (Family Medicine) Satira Sark, MD as PCP - Cardiology (Cardiology) Derek Jack, MD as Medical Oncologist (Medical Oncology) Brien Mates, RN as Oncology Nurse Navigator (Medical Oncology)  CHIEF COMPLAINTS/PURPOSE OF CONSULTATION:  Newly diagnosed left breast cancer  HISTORY OF PRESENTING ILLNESS:  Katelyn Kaufman 71 y.o. female is here because of recent diagnosis of left breast cancer.  Today she reports feeling good, and she is accompanied by her roommate. She reports a fall yesterday while trying to stand up in her bathroom. She walks with a walker or cane. She reports a prior miscarriage following which she had a hysterectomy. She received Depo injections for 2-3 years. She reports she has has a previous left breast lumpectomy which was benign. She reports a prior MI. She reports her activity levels were low even prior to her surgery on 6/26 due to back pain from a pinched nerve. She takes vitamin D.   She lives at home with a roommate. Her brother had cancer of an unknown type. 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 She currently smokes 2-3 cigarettes daily, and previously she smoked 1/2 ppd for 40 years.  In terms of breast cancer risk profile:  She menarched at early age of 71 and had a hysterectomy age 39  She had 4 children, her first child was born at age 53  She never received birth control pills.  She was never exposed to fertility medications or hormone replacement therapy.  She does not have family history of Breast/GYN/GI cancer  I reviewed her records extensively and collaborated the history with the patient.  SUMMARY OF ONCOLOGIC HISTORY: Oncology History   No history exists.   MEDICAL HISTORY:  Past Medical History:  Diagnosis Date   Arthritis    CAD (coronary artery disease) 2018   COPD  (chronic obstructive pulmonary disease) ([place])    Essential hypertension    Hyperlipidemia    Myocardial infarction ([place])    had MIs in 2017, 2018, and 2019 while living in [place]. all intervened on   OSA (obstructive sleep apnea)    CPAP qHS   Type 2 diabetes mellitus ([place])     SURGICAL HISTORY: Past Surgical History:  Procedure Laterality Date   BACK SURGERY     BREAST SURGERY     CATARACT EXTRACTION W/PHACO Right 10/24/2020   Procedure: CATARACT EXTRACTION PHACO AND INTRAOCULAR LENS PLACEMENT ([place]);  Surgeon: Baruch Goldmann, MD;  Location: [facility];  Service: Ophthalmology;  Laterality: Right;  CDE: 10.87   CATARACT EXTRACTION W/PHACO Left 12/01/2020   Procedure: CATARACT EXTRACTION PHACO AND INTRAOCULAR LENS PLACEMENT LEFT EYE;  Surgeon: Baruch Goldmann, MD;  Location: [facility];  Service: Ophthalmology;  Laterality: Left;  CDE  8.62   CORONARY ARTERY BYPASS GRAFT N/A 11/16/2021   Procedure: CORONARY ARTERY BYPASS GRAFTING (CABG) times three using the left internal mammary and right saphenous vein.;  Surgeon: Gaye Pollack, MD;  Location: [facility];  Service: Open Heart Surgery;  Laterality: N/A;   IABP INSERTION N/A 11/16/2021   Procedure: IABP Insertion;  Surgeon: Leonie Man, MD;  Location: [facility];  Service: Cardiovascular;  Laterality: N/A;   LEFT HEART CATH AND CORONARY ANGIOGRAPHY N/A 11/16/2021   Procedure: LEFT HEART CATH AND CORONARY ANGIOGRAPHY;  Surgeon: Leonie Man, MD;  Location: [facility];  Service: Cardiovascular;  Laterality: N/A;   REPLACEMENT TOTAL KNEE Right    TEE WITHOUT CARDIOVERSION N/A 11/16/2021   Procedure: TRANSESOPHAGEAL ECHOCARDIOGRAM (TEE);  Surgeon: Gaye Pollack, MD;  Location: [facility];  Service: Open Heart Surgery;  Laterality: N/A;   TUBAL LIGATION      SOCIAL HISTORY: Social History   Socioeconomic History   Marital status: Widowed    Spouse name: Not on file   Number of children: Not on file   Years of education: Not on file    Highest education level: Not on file  Occupational History   Not on file  Tobacco Use   Smoking status: Every Day    Packs/day: 0.50    Years: 40.00    Total pack years: 20.00    Types: Cigarettes   Smokeless tobacco: Never  Vaping Use   Vaping Use: Never used  Substance and Sexual Activity   Alcohol use: Never   Drug use: Never   Sexual activity: Not Currently  Other Topics Concern   Not on file  Social History Narrative   Not on file   Social Determinants of Health   Financial Resource Strain: Not on file  Food Insecurity: Not on file  Transportation Needs: Not on file  Physical Activity: Not on file  Stress: Not on file  Social Connections: Not on file  Intimate Partner Violence: Not on file    FAMILY HISTORY: Family History  Problem Relation Age of Onset   COPD Mother    Alcohol abuse Father    Hypertension Sister    Heart attack Brother     ALLERGIES:  is allergic to latex.  MEDICATIONS:  Current Outpatient Medications  Medication Sig Dispense Refill   Ascorbic Acid (VITAMIN C) 1000 MG tablet Take 1,000 mg by mouth daily.     aspirin EC 81 MG tablet Take 1 tablet (81 mg total) by mouth daily. Swallow whole. 30 tablet 12   Calcium Carb-Cholecalciferol (CALCIUM-VITAMIN D3) 600-400 MG-UNIT TABS Take 1 capsule by mouth daily.     cholecalciferol (VITAMIN D3) 25 MCG (1000 UNIT) tablet Take 1,000 Units by mouth daily.     clopidogrel (PLAVIX) 75 MG tablet TAKE 1 TABLET BY MOUTH EVERY DAY 90 tablet 3   cyclobenzaprine (FLEXERIL) 10 MG tablet Take 10 mg by mouth 2 (two) times daily as needed for muscle spasms.     DULoxetine (CYMBALTA) 60 MG capsule Take 60 mg by mouth daily.     ezetimibe (ZETIA) 10 MG tablet Take 10 mg by mouth daily.     glimepiride (AMARYL) 4 MG tablet Take 4 mg by mouth daily with breakfast.     hydrOXYzine (ATARAX) 25 MG tablet Take 25 mg by mouth in the morning and at bedtime.     insulin glargine (LANTUS) 100 UNIT/ML injection Inject 40  Units into the skin at bedtime.     isosorbide mononitrate (IMDUR) 60 MG 24 hr tablet Take 1 tablet by mouth daily.     losartan (COZAAR) 25 MG tablet Take 1 tablet (25 mg total) by mouth daily. 30 tablet 1   metFORMIN (GLUCOPHAGE) 500 MG tablet Take 500-1,000 mg by mouth 2 (two) times daily with a meal. Take 2 tablets (1000 mg) in the morning and Take 1 tablets (500 mg) in the evening     metoprolol tartrate (LOPRESSOR) 25 MG tablet Take 1 tablet (25 mg total) by mouth 2 (two) times daily. 60 tablet 1   mometasone (ELOCON) 0.1 % ointment Apply  1 Application topically daily.     nitroGLYCERIN (NITROSTAT) 0.4 MG SL tablet Place 0.4 mg under the tongue every 5 (five) minutes as needed for chest pain.     Omega-3 Fatty Acids (DIALYVITE OMEGA-3 CONCENTRATE) 600 MG CAPS Take 600 mg by mouth daily.     oxyCODONE-acetaminophen (PERCOCET) 10-325 MG tablet Take 1 tablet by mouth every 4 (four) hours as needed for pain. 28 tablet 0   pregabalin (LYRICA) 75 MG capsule Take 75-150 mg by mouth 2 (two) times daily. Take 2 tablets (150 mg) in the morning & Take 1 tablet (75 mg) at bedtime     rosuvastatin (CRESTOR) 10 MG tablet Take 10 mg by mouth daily.     triamcinolone ointment (KENALOG) 0.1 % Apply 1 Application topically daily as needed (For rash).     vitamin B-12 (CYANOCOBALAMIN) 1000 MCG tablet Take 1,000 mcg by mouth daily.     No current facility-administered medications for this visit.    REVIEW OF SYSTEMS:   Review of Systems  Constitutional:  Negative for appetite change and fatigue.  Respiratory:  Positive for cough.   Cardiovascular:  Positive for chest pain (8/10 R breast).  Gastrointestinal:  Positive for nausea and vomiting.  Musculoskeletal:  Positive for arthralgias (8/10 L hip and R knee) and back pain.  Neurological:  Positive for dizziness and numbness.  Psychiatric/Behavioral:  Positive for depression and sleep disturbance. The patient is nervous/anxious.   All other systems  reviewed and are negative.   PHYSICAL EXAMINATION: ECOG PERFORMANCE STATUS: 2 - Symptomatic, <50% confined to bed  Vitals:   12/10/21 1329  BP: 139/88  Pulse: 87  Resp: 18  Temp: 98.8 F (37.1 C)  SpO2: 99%   Filed Weights   12/10/21 1329  Weight: 254 lb 8 oz (115.4 kg)   Physical Exam Vitals reviewed.  Constitutional:      Appearance: Normal appearance. She is obese.  Cardiovascular:     Rate and Rhythm: Normal rate and regular rhythm.     Pulses: Normal pulses.     Heart sounds: Normal heart sounds.  Pulmonary:     Effort: Pulmonary effort is normal.     Breath sounds: Normal breath sounds.  Chest:  Breasts:    Right: No swelling, bleeding, inverted nipple, mass, nipple discharge, skin change or tenderness.     Left: Tenderness (above areola) present. No swelling, bleeding, inverted nipple, mass, nipple discharge or skin change (lumpectomy scar above areola; UOQ lumpectomy scar).  Abdominal:     General: A surgical scar is present.     Palpations: Abdomen is soft.  Musculoskeletal:     Right lower leg: No edema.     Left lower leg: No edema.  Lymphadenopathy:     Upper Body:     Right upper body: No supraclavicular or axillary adenopathy.     Left upper body: No supraclavicular or axillary adenopathy.  Neurological:     General: No focal deficit present.     Mental Status: She is alert and oriented to person, place, and time.  Psychiatric:        Mood and Affect: Mood normal.        Behavior: Behavior normal.     Breast Exam Chaperone: Thana Ates    LABORATORY DATA:  I have reviewed the data as listed   RADIOGRAPHIC STUDIES: I have personally reviewed the radiological reports and agreed with the findings in the report. DG Chest Port 1 View  Result Date: 11/19/2021 CLINICAL  DATA:  Shortness of breath, chest pain EXAM: PORTABLE CHEST 1 VIEW COMPARISON:  11/17/2021 FINDINGS: Interval removal of endotracheal tube, enteric tube, bilateral chest tubes,  mediastinal drain, and pulmonary arterial catheter. Right IJ sheath remains in place. Stable heart size status post sternotomy and CABG. Bandlike atelectasis within the left lung base and right mid lung. No pleural effusion or pneumothorax. IMPRESSION: Band-like atelectasis within the right mid lung and left lung base. Otherwise, no acute cardiopulmonary findings. No pneumothorax. Electronically Signed   By: Davina Poke D.O.   On: 11/19/2021 08:22   DG Chest Port 1 View  Result Date: 11/17/2021 CLINICAL DATA:  71 year old female with history of CABG yesterday. Follow-up study. EXAM: PORTABLE CHEST 1 VIEW COMPARISON:  Chest x-ray 11/17/2021. FINDINGS: An endotracheal tube is in place with tip 4.8 cm above the carina. A nasogastric tube is seen extending into the stomach, however, the tip of the nasogastric tube extends below the lower margin of the image. Right internal jugular Cordis through which a Swan-Ganz catheter has been passed into the distal pulmonic trunk. Bilateral chest tubes are noted, with tips and side ports projecting over the mid thorax bilaterally. Lung volumes are low. No definite pneumothorax. Patchy areas of interstitial prominence are noted throughout the mid to lower lungs bilaterally. No pleural effusions. Pulmonary vasculature is mildly engorged. Heart size is mildly enlarged. Upper mediastinal contours are within normal limits. Atherosclerotic calcifications are noted in the thoracic aorta. Status post median sternotomy. IMPRESSION: 1. Postoperative changes and support apparatus, as above. 2. Patchy areas of interstitial prominence noted throughout the mid to lower lungs bilaterally. Given the engorgement of the pulmonary vasculature, mild interstitial pulmonary edema is suspected. 3. Aortic atherosclerosis. Electronically Signed   By: Vinnie Langton M.D.   On: 11/17/2021 06:22   DG Abd 1 View  Result Date: 11/17/2021 CLINICAL DATA:  Tubes and lines EXAM: ABDOMEN - 1 VIEW  COMPARISON:  None Available. FINDINGS: An enteric tube tip and side-port is at the level of the stomach. No evidence of bowel obstruction. History of aortic balloon pump. A marker overlaps the L5 right paramedian level, iliac artery region. IMPRESSION: 1. Enteric tube with tip at the stomach. 2. Balloon marker over the right iliac region. Electronically Signed   By: Jorje Guild M.D.   On: 11/17/2021 06:22   DG Chest Port 1 View  Result Date: 11/17/2021 CLINICAL DATA:  Pneumothorax EXAM: PORTABLE CHEST 1 VIEW COMPARISON:  11/17/2021 FINDINGS: Endotracheal tube is seen 4.4 cm above the carina. Nasogastric tube extends into the upper abdomen beyond the margin of the examination. Right internal jugular Swan-Ganz catheter tip is seen within the expected pulmonary arterial bifurcation. Mediastinal drain, single right and dual left chest tubes are unchanged. Lung volumes are small, but are symmetric. Lungs are clear. No pneumothorax or pleural effusion. Median sternotomy has been performed. Cardiac size within normal limits. IMPRESSION: 1. Support tubes in appropriate position. 2. No pneumothorax. 3. Pulmonary hypoinflation. Electronically Signed   By: Fidela Salisbury M.D.   On: 11/17/2021 01:09   ECHO INTRAOPERATIVE TEE  Result Date: 11/16/2021  *INTRAOPERATIVE TRANSESOPHAGEAL REPORT *  [name] [facility] Date of Exam: 11/16/2021 Medical Rec #:  [id number]        Height:       64.0 in Accession #:    [id number]       Weight:       252.2 lb Date of Birth:  March 29, 1951  BSA:          2.16 m Patient Age:    79 years         BP:           227/86 mmHg Patient Gender: F                HR:           66 bpm. Exam Location:  Anesthesiology Transesophogeal exam was perform intraoperatively during surgical procedure. Patient was closely monitored under general anesthesia during the entirety of examination. Indications:     R07.9* Chest pain, unspecified; I10 Hypertension; R94.31                  Abnormal  EKG; I20.9 Angina pectoris, unspecified; I25.110                  Atherosclerotic heart disease of native coronary artery with                  unstable angina pectoris Performing Phys: [id number] BARTLE Diagnosing Phys: Oleta Mouse MD Complications: No known complications during this procedure. POST-OP IMPRESSIONS _ Left Ventricle: has hyperdynamic systolic function, with an ejection fraction of 70%. The cavity size was normal. The wall motion is normal. _ Right Ventricle: normal function. The cavity was normal. The wall motion is normal. _ Aorta: there is no dissection present in the aorta. _ Aortic Valve: The aortic valve appears unchanged from pre-bypass. _ Mitral Valve: There is mild regurgitation. _ Tricuspid Valve: There is mild regurgitation. PRE-OP FINDINGS  Left Ventricle: The left ventricle has hyperdynamic systolic function, with an ejection fraction of >65%. The cavity size was normal. No evidence of left ventricular regional wall motion abnormalities. There is severe concentric left ventricular hypertrophy. Right Ventricle: The right ventricle has normal systolic function. The cavity was normal. There is no increase in right ventricular wall thickness. Left Atrium: Left atrial size was normal in size. No left atrial/left atrial appendage thrombus was detected. Left atrial appendage velocity is normal at greater than 40 cm/s. Right Atrium: Right atrial size was normal in size. Interatrial Septum: No atrial level shunt detected by color flow Doppler. There is no evidence of a patent foramen ovale. Pericardium: Trivial pericardial effusion is present. The pericardial effusion is circumferential. Mitral Valve: The mitral valve is normal in structure. Mitral valve regurgitation trace to mild. The MR jet is centrally-directed. There is No evidence of mitral stenosis. Tricuspid Valve: The tricuspid valve was normal in structure. Tricuspid valve regurgitation is trivial by color flow Doppler. No  evidence of tricuspid stenosis is present. Aortic Valve: The aortic valve is tricuspid Aortic valve regurgitation was not visualized by color flow Doppler. There is no stenosis of the aortic valve, with a calculated valve area of 2.81 cm. Pulmonic Valve: The pulmonic valve was normal in structure No evidence of pumonic stenosis. Pulmonic valve regurgitation is mild by color flow Doppler. Aorta: There is evidence of a dissection in the none. Balloon pump seen in aorta distal to left subclavian take off. +--------------+--------++ LEFT VENTRICLE         +--------------+--------++ PLAX 2D                +--------------+--------++ LVOT diam:    2.40 cm  +--------------+--------++ LVOT Area:    4.52 cm +--------------+--------++                        +--------------+--------++ +------------------+-----------++ AORTIC  VALVE                  +------------------+-----------++ AV Area (Vmax):   3.06 cm    +------------------+-----------++ AV Area (Vmean):  3.42 cm    +------------------+-----------++ AV Area (VTI):    2.81 cm    +------------------+-----------++ AV Vmax:          122.00 cm/s +------------------+-----------++ AV Vmean:         69.500 cm/s +------------------+-----------++ AV VTI:           0.247 m     +------------------+-----------++ AV Peak Grad:     6.0 mmHg    +------------------+-----------++ AV Mean Grad:     2.0 mmHg    +------------------+-----------++ LVOT Vmax:        82.55 cm/s  +------------------+-----------++ LVOT Vmean:       52.550 cm/s +------------------+-----------++ LVOT VTI:         0.154 m     +------------------+-----------++ LVOT/AV VTI ratio:0.62        +------------------+-----------++ +-------------+---------++ MITRAL VALVE           +--------------+-------+ +-------------+---------++ SHUNTS                MV Peak grad:1.7 mmHg  +--------------+-------+ +-------------+---------++ Systemic VTI:  0.15 m  MV Mean grad:1.0 mmHg  +--------------+-------+ +-------------+---------++ Systemic Diam:2.40 cm MV Vmax:     0.66 m/s  +--------------+-------+ +-------------+---------++ MV Vmean:    44.2 cm/s +-------------+---------++ MV VTI:      0.23 m    +-------------+---------++  Oleta Mouse MD Electronically signed by Oleta Mouse MD Signature Date/Time: 11/16/2021/11:59:33 PM    Final    CARDIAC CATHETERIZATION  Result Date: 11/16/2021   Mid LM to Prox LAD lesion is 95% stenosed with 99% stenosed side branch in Ost Cx.  Ramus lesion is 95% stenosed. - Trifucation lesion   Mid LAD stent is 5% stenosed.  Dist LAD stent is 10% stenosed.   Prox RCA lesion is 45% stenosed.   The left ventricular systolic function is normal.  The left ventricular ejection fraction is 50-55% by visual estimate.   There is no aortic valve stenosis. ----------------------- Severe distal LM-trifurcation LAD, RI, LCx 95-99 % eccentric stenosis with minimal downstream disease. LAD has mid and distal vessel stent with diffuse calcification.  Stents are widely patent.  LAD reaches the apex.  1 very proximal ramus like small diagonal branch followed by 2 additional diagonal branches.  Too small for bypass. Ramus or medius is a large-caliber vessel that reaches the apex with distal branches. LCx courses as of the lateral OM/LPL distally after giving rise to small to moderate-sized OM1, OM 2 and OM 4 with minuscule OM 3.  Too small for grafting. RCA has proximal eccentric 45 to 50% stenosis with minimal distal disease giving rise to RPDA and 2 PL branches.  No significant disease in the RCA system. Preserved LVEF with mild mid to apical anterior hypokinesis. Hemodynamics: Central AoP: 176/89 with a MAP 124 mmHg LVP/EDP: 172/5-15 mmHg Recommendations: Urgent/emergent CVTS consultation with Dr. Cyndia Bent performed in the Cath Lab.  Plan is for emergent CABG Continue IABP via RFA Radial sheath sutured in place to be  used for arterial line during procedure.  Removal in the CVICU post CABG with TR band placed by catheter supervisor.  Glenetta Hew, M.D., M.S. Interventional Cardiologist   DG Chest Port 1 View  Result Date: 11/16/2021 CLINICAL DATA:  Chest pain EXAM: PORTABLE CHEST 1 VIEW COMPARISON:  06/04/2021 from [facility] FINDINGS: Numerous leads and wires project over the chest. Midline trachea. Normal heart size. No pleural effusion or pneumothorax. Clear lungs. IMPRESSION: No active disease. Electronically Signed   By: Abigail Miyamoto M.D.   On: 11/16/2021 14:30     ASSESSMENT:  Stage Ib (T2N0G2) left breast cancer: - Abnormal screening mammogram on 10/14/2021, followed by diagnostic mammogram on 10/27/2021 - Left breast ultrasound (11/16/2021): Irregular mass containing calcifications in the left breast at 2 o'clock position 2 cm from nipple measuring 1.7 x 0.6 x 1 cm.  In addition there are several similar-appearing masses extending from this mass towards the nipple with a mass in the left breast 2:00 retroareolar measuring 0.4 x 0.4 x 0.4 cm.  Together these masses span a distance of approximately 2.7 cm.  No lymphadenopathy in the axilla. 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 - She will underwent CABG x3 on 11/16/2021   Social/family history: - She lives at home with roommate.  She worked in Electronics engineer.  Current smoker, half pack per day for 40 years. - Brother had cancer.   PLAN:  Stage Ib (T2N0G2) left breast cancer, ER/PR +, HER2-: - We reviewed her imaging studies and biopsy results in detail. - It is highly likely that it is a 1 mass extending from 2 o'clock position to retroareolar region.  I will leave it up to surgery to see if she needs an MRI of the breast. - I have recommended surgical  consultation for lumpectomy with or without SLNB.  As she is more than 71 years old, based on choosing wisely initiated to, SLNB may be omitted. - I have recommended follow-up with me 1 month after surgery.  2.  Bone health: - I have recommended baseline bone density test and vitamin D levels.    Derek Jack, MD 12/10/21 5:09 PM  [place] [phone]   I, Thana Ates, am acting as a scribe for Dr. Derek Jack.  I, Derek Jack MD, have reviewed the above documentation for accuracy and completeness, and I agree with the above.

## 2021-12-10 NOTE — Patient Instructions (Addendum)
[place] at [facility] Discharge Instructions  You were seen and examined today by Dr. Delton Coombes. Dr. Delton Coombes is a medical oncologist, meaning that he specializes in the treatment of cancer diagnoses. Dr. Delton Coombes discussed your past medical history, family history of cancers, and the events that led to you being here today.  You were referred to Dr. Delton Coombes due to your new diagnosis of hormone receptor positive invasive mammary carcinoma. This is a common type of breast cancer that is being fed by the hormones (estrogen and progesterone) that occur naturally in your body.  The best course of action is surgery. Surgery would completely remove the cancer and result in the cancer being cured. Without surgery, cure would not be achievable. Dr. Delton Coombes will refer you to meet with a general surgeon to discuss surgical options.  Following surgery, Dr. Delton Coombes will discuss ongoing options to prevent the cancer from returning. This would be in the form of anti-estrogen medications taken once daily for at least 5 years. This will protect both breasts from developing breast cancer. Prior to starting that, Dr. Delton Coombes would like for you to have a bone density scan and a vitamin d level.  Follow-up with Dr. Delton Coombes approximately 4 weeks after surgery. Call us earlier if needed.  Thank you for choosing [place] at [facility] to provide your oncology and hematology care.  To afford each patient quality time with our provider, please arrive at least 15 minutes before your scheduled appointment time.   If you have a lab appointment with the [facility] please come in thru the Main Entrance and check in at the main information desk.  You need to re-schedule your appointment should you arrive 10 or more minutes late.  We strive to give you quality time with our providers, and arriving late affects you and other patients whose appointments are  after yours.  Also, if you no show three or more times for appointments you may be dismissed from the clinic at the providers discretion.     Again, thank you for choosing [facility].  Our hope is that these requests will decrease the amount of time that you wait before being seen by our physicians.       _____________________________________________________________  Should you have questions after your visit to [facility], please contact our office at [phone] and follow the prompts.  Our office hours are 8:00 a.m. and 4:30 p.m. Monday - Friday.  Please note that voicemails left after 4:00 p.m. may not be returned until the following business day.  We are closed weekends and major holidays.  You do have access to a nurse 24-7, just call the main number to the clinic [phone] and do not press any options, hold on the line and a nurse will answer the phone.    For prescription refill requests, have your pharmacy contact our office and allow 72 hours.

## 2021-12-11 NOTE — Progress Notes (Signed)
[address]       [place] [address]             [phone]       HPI: This is a 71 year old female with a past medical history of hypertension, Hyperlipidemia, DM, COPD, OSA, and arthritis who presented to [facility] ED with a NSTEMI on 11/16/2021. Patient returns for routine postoperative follow-up having undergone an emergent CABG x 3 (LIMA to LAD, SVG to OM, SVG to ramus intermediate) by Dr. Laneta Simmers on 11/17/2021. She was discharged in stable condition on 11/23/2021. She denies chest pain.  Current Outpatient Medications  Medication Sig Dispense Refill   Ascorbic Acid (VITAMIN C) 1000 MG tablet Take 1,000 mg by mouth daily.     aspirin EC 81 MG tablet Take 1 tablet (81 mg total) by mouth daily. Swallow whole. 30 tablet 12   Calcium Carb-Cholecalciferol (CALCIUM-VITAMIN D3) 600-400 MG-UNIT TABS Take 1 capsule by mouth daily.     cholecalciferol (VITAMIN D3) 25 MCG (1000 UNIT) tablet Take 1,000 Units by mouth daily.     clopidogrel (PLAVIX) 75 MG tablet TAKE 1 TABLET BY MOUTH EVERY DAY 90 tablet 3   cyclobenzaprine (FLEXERIL) 10 MG tablet Take 10 mg by mouth 2 (two) times daily as needed for muscle spasms.     DULoxetine (CYMBALTA) 60 MG capsule Take 60 mg by mouth daily.     ezetimibe (ZETIA) 10 MG tablet Take 10 mg by mouth daily.     glimepiride (AMARYL) 4 MG tablet Take 4 mg by mouth daily with breakfast.     hydrOXYzine (ATARAX) 25 MG tablet Take 25 mg by mouth in the morning and at bedtime.     insulin glargine (LANTUS) 100 UNIT/ML injection Inject 40 Units into the skin at bedtime.     isosorbide mononitrate (IMDUR) 60 MG 24 hr tablet Take 1 tablet by mouth daily.     losartan (COZAAR) 25 MG tablet Take 1 tablet (25 mg total) by mouth daily. 30 tablet 1   metFORMIN (GLUCOPHAGE) 500 MG tablet Take 500-1,000 mg by mouth 2 (two) times daily with a meal. Take 2 tablets (1000 mg) in the morning and Take 1 tablets (500 mg) in the evening     metoprolol tartrate  (LOPRESSOR) 25 MG tablet Take 1 tablet (25 mg total) by mouth 2 (two) times daily. 60 tablet 1   mometasone (ELOCON) 0.1 % ointment Apply 1 Application topically daily.     nitroGLYCERIN (NITROSTAT) 0.4 MG SL tablet Place 0.4 mg under the tongue every 5 (five) minutes as needed for chest pain.     Omega-3 Fatty Acids (DIALYVITE OMEGA-3 CONCENTRATE) 600 MG CAPS Take 600 mg by mouth daily.     oxyCODONE-acetaminophen (PERCOCET) 10-325 MG tablet Take 1 tablet by mouth every 4 (four) hours as needed for pain. 28 tablet 0   pregabalin (LYRICA) 75 MG capsule Take 75-150 mg by mouth 2 (two) times daily. Take 2 tablets (150 mg) in the morning & Take 1 tablet (75 mg) at bedtime     rosuvastatin (CRESTOR) 10 MG tablet Take 10 mg by mouth daily.     triamcinolone ointment (KENALOG) 0.1 % Apply 1 Application topically daily as needed (For rash).     vitamin B-12 (CYANOCOBALAMIN) 1000 MCG tablet Take 1,000 mcg by mouth daily.    Vital Signs: Vitals:   12/16/21 1359  BP: (!) 170/100  Pulse: 90  Resp: 20  SpO2: 97%  Physical Exam: CV-RRR Pulmonary-Clear to auscultation bilaterally Abdomen-Soft, obese, non tender Extremities-No LE edema Wounds-Clean, dry, well healed  Diagnostic Tests:   CLINICAL DATA:  Status post CABG x3 11/16/2021.   EXAM: CHEST - 2 VIEW   COMPARISON:  [facility] chest 11/19/2021   FINDINGS: Interval removal of right internal jugular central venous catheter. Status post median sternotomy. Cardiac silhouette and mediastinal contours are within normal limits. Mild calcification within the aortic arch. The lungs are clear. Resolution of the prior bibasilar subsegmental atelectasis. No pleural effusion or pneumothorax. Mild multilevel degenerative disc changes of the thoracic spine. No acute skeletal abnormality.   IMPRESSION: Status post median sternotomy.  No acute lung process.     Electronically Signed   By: Neita Garnet M.D.   On: 12/16/2021 14:02    Impression and  Plan: She is hypertensive. She states she has ordered a BP device. I instructed her to keep a record. If first number is 140 or higher consistently, she is to call cardiology or [facility] office as will need to titrate medication. She was instructed on the importance of BP control as at increased risk of stroke etc. I sent via EMR a new prescription for Losartan 50 mg daily (on 25 mg daily). Also, she was on Carvedilol prior to surgery. We may need to stop Lopressor and start this if BP is not well controlled. She was seen by Jacolyn Reedy NP from cardiology on 07/12. Her BP was "normal" at this visit.  She was instructed to make every effort to keep your diabetes under very tight control.  Follow up closely with your primary care physician or endocrinologist and strive to keep their hemoglobin A1c levels as low as possible, preferably near or below 6.0.   The long term benefits of strict control of diabetes are far reaching and critically important for your overall health and survival.  You are encouraged to enroll and participate in the outpatient cardiac rehab program beginning as soon as practical.  Continue to avoid any heavy lifting or strenuous use of your arms or shoulders for at least a total of three months from the time of surgery.  After three months, you may gradually increase how much you lift or otherwise use your arms or chest as tolerated, with limits based upon whether or not activities lead to the return of significant discomfort. Patient requests to return to see Dr. Laneta Simmers in about one month.   Ardelle Balls, PA-C [facility] [phone]

## 2021-12-14 ENCOUNTER — Ambulatory Visit: Payer: Medicare HMO | Admitting: Hematology

## 2021-12-15 ENCOUNTER — Encounter: Payer: Self-pay | Admitting: General Surgery

## 2021-12-15 ENCOUNTER — Ambulatory Visit: Payer: Medicare HMO | Admitting: General Surgery

## 2021-12-15 VITALS — BP 151/84 | HR 80 | Temp 98.3°F | Resp 16 | Ht 64.0 in | Wt 256.0 lb

## 2021-12-15 DIAGNOSIS — C50912 Malignant neoplasm of unspecified site of left female breast: Secondary | ICD-10-CM | POA: Diagnosis not present

## 2021-12-16 ENCOUNTER — Other Ambulatory Visit: Payer: Self-pay | Admitting: Surgery

## 2021-12-16 ENCOUNTER — Ambulatory Visit
Admission: RE | Admit: 2021-12-16 | Discharge: 2021-12-16 | Disposition: A | Discharge status: Home / Self Care | Payer: Medicare HMO | Source: Ambulatory Visit | Attending: Surgery | Admitting: Surgery

## 2021-12-16 ENCOUNTER — Other Ambulatory Visit: Payer: Self-pay | Admitting: Physician Assistant

## 2021-12-16 ENCOUNTER — Ambulatory Visit (INDEPENDENT_AMBULATORY_CARE_PROVIDER_SITE_OTHER): Payer: Self-pay | Admitting: Physician Assistant

## 2021-12-16 VITALS — BP 181/109 | HR 83 | Resp 20 | Ht 64.0 in | Wt 255.0 lb

## 2021-12-16 DIAGNOSIS — Z951 Presence of aortocoronary bypass graft: Secondary | ICD-10-CM

## 2021-12-16 MED ORDER — LOSARTAN POTASSIUM 50 MG PO TABS
50.0000 mg | ORAL_TABLET | Freq: Every day | ORAL | 1 refills | Status: DC
Start: 2021-12-16 — End: 2022-01-05

## 2021-12-16 NOTE — Progress Notes (Signed)
Katelyn Kaufman; [id number]; 07/11/1950   HPI Patient is a 71 year old black female who was referred to my care by Dr. Delton Coombes of Oncology for evaluation and treatment of newly diagnosed left breast carcinoma.  This was found on routine mammography.  Several biopsies in the left breast revealed invasive ductal carcinoma, ER positive, PR positive, HER2 positive.  Several days after this procedure, she presented emergency room with worsening chest pain.  She was diagnosed with a non-STEMI with ongoing ischemic symptoms and ultimately underwent emergent CABG.  This was performed on 11/16/2021.  She has since recovered.  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  She denies any family history of breast cancer. Past Medical History:  Diagnosis Date   Arthritis    CAD (coronary artery disease) 2018   COPD (chronic obstructive pulmonary disease) ([place])    Essential hypertension    Hyperlipidemia    Myocardial infarction ([place])    had MIs in 2017, 2018, and 2019 while living in [place]. all intervened on   OSA (obstructive sleep apnea)    CPAP qHS   Type 2 diabetes mellitus ([place])     Past Surgical History:  Procedure Laterality Date   BACK SURGERY     BREAST SURGERY     CATARACT EXTRACTION W/PHACO Right 10/24/2020   Procedure: CATARACT EXTRACTION PHACO AND INTRAOCULAR LENS PLACEMENT ([place]);  Surgeon: Baruch Goldmann, MD;  Location: [facility];  Service: Ophthalmology;  Laterality: Right;  CDE: 10.87   CATARACT EXTRACTION W/PHACO Left 12/01/2020   Procedure: CATARACT EXTRACTION PHACO AND INTRAOCULAR LENS PLACEMENT LEFT EYE;  Surgeon: Baruch Goldmann, MD;  Location: [facility];  Service: Ophthalmology;  Laterality: Left;  CDE  8.62   CORONARY ARTERY BYPASS GRAFT N/A 11/16/2021   Procedure: CORONARY ARTERY BYPASS GRAFTING (CABG) times three using the left internal mammary and right saphenous vein.;  Surgeon: Gaye Pollack, MD;  Location: [facility];  Service: Open Heart Surgery;  Laterality: N/A;   IABP INSERTION  N/A 11/16/2021   Procedure: IABP Insertion;  Surgeon: Leonie Man, MD;  Location: [facility];  Service: Cardiovascular;  Laterality: N/A;   LEFT HEART CATH AND CORONARY ANGIOGRAPHY N/A 11/16/2021   Procedure: LEFT HEART CATH AND CORONARY ANGIOGRAPHY;  Surgeon: Leonie Man, MD;  Location: [facility];  Service: Cardiovascular;  Laterality: N/A;   REPLACEMENT TOTAL KNEE Right    TEE WITHOUT CARDIOVERSION N/A 11/16/2021   Procedure: TRANSESOPHAGEAL ECHOCARDIOGRAM (TEE);  Surgeon: Gaye Pollack, MD;  Location: [facility];  Service: Open Heart Surgery;  Laterality: N/A;   TUBAL LIGATION      Family History  Problem Relation Age of Onset   COPD Mother    Alcohol abuse Father    Hypertension Sister    Heart attack Brother     Current Outpatient Medications on File Prior to Visit  Medication Sig Dispense Refill   Ascorbic Acid (VITAMIN C) 1000 MG tablet Take 1,000 mg by mouth daily.     aspirin EC 81 MG tablet Take 1 tablet (81 mg total) by mouth daily. Swallow whole. 30 tablet 12   Calcium Carb-Cholecalciferol (CALCIUM-VITAMIN D3) 600-400 MG-UNIT TABS Take 1 capsule by mouth daily.     cholecalciferol (VITAMIN D3) 25 MCG (1000 UNIT) tablet Take 1,000 Units by mouth daily.     clopidogrel (PLAVIX) 75 MG tablet TAKE 1 TABLET BY MOUTH EVERY DAY 90 tablet 3   cyclobenzaprine (FLEXERIL) 10 MG tablet Take 10 mg by mouth 2 (  two) times daily as needed for muscle spasms.     DULoxetine (CYMBALTA) 60 MG capsule Take 60 mg by mouth daily.     ezetimibe (ZETIA) 10 MG tablet Take 10 mg by mouth daily.     glimepiride (AMARYL) 4 MG tablet Take 4 mg by mouth daily with breakfast.     hydrOXYzine (ATARAX) 25 MG tablet Take 25 mg by mouth in the morning and at bedtime.     insulin glargine (LANTUS) 100 UNIT/ML injection Inject 40 Units into the skin at bedtime.     isosorbide mononitrate (IMDUR) 60 MG 24 hr tablet Take 1 tablet by mouth daily.     losartan (COZAAR) 25 MG tablet Take 1  tablet (25 mg total) by mouth daily. 30 tablet 1   metFORMIN (GLUCOPHAGE) 500 MG tablet Take 500-1,000 mg by mouth 2 (two) times daily with a meal. Take 2 tablets (1000 mg) in the morning and Take 1 tablets (500 mg) in the evening     metoprolol tartrate (LOPRESSOR) 25 MG tablet Take 1 tablet (25 mg total) by mouth 2 (two) times daily. 60 tablet 1   mometasone (ELOCON) 0.1 % ointment Apply 1 Application topically daily.     nitroGLYCERIN (NITROSTAT) 0.4 MG SL tablet Place 0.4 mg under the tongue every 5 (five) minutes as needed for chest pain.     Omega-3 Fatty Acids (DIALYVITE OMEGA-3 CONCENTRATE) 600 MG CAPS Take 600 mg by mouth daily.     oxyCODONE-acetaminophen (PERCOCET) 10-325 MG tablet Take 1 tablet by mouth every 4 (four) hours as needed for pain. 28 tablet 0   pregabalin (LYRICA) 75 MG capsule Take 75-150 mg by mouth 2 (two) times daily. Take 2 tablets (150 mg) in the morning & Take 1 tablet (75 mg) at bedtime     rosuvastatin (CRESTOR) 10 MG tablet Take 10 mg by mouth daily.     triamcinolone ointment (KENALOG) 0.1 % Apply 1 Application topically daily as needed (For rash).     vitamin B-12 (CYANOCOBALAMIN) 1000 MCG tablet Take 1,000 mcg by mouth daily.     No current facility-administered medications on file prior to visit.    Allergies  Allergen Reactions   Latex Rash    Social History   Substance and Sexual Activity  Alcohol Use Never    Social History   Tobacco Use  Smoking Status Every Day   Packs/day: 0.50   Years: 40.00   Total pack years: 20.00   Types: Cigarettes  Smokeless Tobacco Never    Review of Systems  HENT: Negative.    Eyes:  Positive for blurred vision.  Respiratory:  Positive for cough and wheezing.   Cardiovascular:  Positive for chest pain.  Gastrointestinal:  Positive for abdominal pain, heartburn and nausea.  Genitourinary:  Positive for frequency and urgency.  Musculoskeletal:  Positive for back pain, joint pain and neck pain.  Skin:   Positive for rash.  Neurological:  Positive for dizziness, sensory change and headaches.  Endo/Heme/Allergies: Negative.   Psychiatric/Behavioral: Negative.      Objective   Vitals:   12/15/21 1122  BP: (!) 151/84  Pulse: 80  Resp: 16  Temp: 98.3 F (36.8 C)  SpO2: 90%    Physical Exam Vitals reviewed. Exam conducted with a chaperone present.  Constitutional:      Appearance: She is obese. She is not ill-appearing.     Comments: Uses a cane.  HENT:     Head: Normocephalic and atraumatic.  Cardiovascular:  Rate and Rhythm: Normal rate and regular rhythm.     Heart sounds: Normal heart sounds. No murmur heard.    No friction rub. No gallop.  Pulmonary:     Effort: Pulmonary effort is normal. No respiratory distress.     Breath sounds: Normal breath sounds. No stridor. No wheezing, rhonchi or rales.  Skin:    General: Skin is warm and dry.  Neurological:     Mental Status: She is alert and oriented to person, place, and time.   Breast: Increased density at the 2 o'clock position just outside the areola of the left breast.  No axillary lymphadenopathy noted.  No dimpling noted.  No nipple discharge noted.  Old surgical scars present. Left breast reveals no dominant mass, nipple discharge, or dimpling.  The axilla is negative for palpable nodes. Mammogram, ultrasound, and surgical pathology reports reviewed Assessment  Invasive ductal carcinoma of the left breast, ER/PR positive Status post CABG for non-STEMI, 1 month postop. Morbid obesity, hypertension, non-insulin-dependent diabetes mellitus Plan  I agree with Dr. Delton Coombes about proceeding with only a left partial mastectomy, pending an MRI of the breast.  Given the fact that she just had a significant cardiac event with a non-STEMI, any surgery needs to be delayed by at least 3 months.  I do not feel this will impact her ultimate breast cancer disease course.  She is scheduled to see cardiology again in October.  Will  then see the patient after that and schedule the MRI closer to the time of surgical intervention.  She understands and agrees to the treatment plan.

## 2021-12-16 NOTE — Patient Instructions (Addendum)
She was instructed to make every effort to keep your diabetes under very tight control.  Follow up closely with your primary care physician or endocrinologist and strive to keep their hemoglobin A1c levels as low as possible, preferably near or below 6.0.   The long term benefits of strict control of diabetes are far reaching and critically important for your overall health and survival.    You are encouraged to enroll and participate in the outpatient cardiac rehab program beginning as soon as practical.   Continue to avoid any heavy lifting or strenuous use of your arms or shoulders for at least a total of three months from the time of surgery.  After three months, you may gradually increase how much you lift or otherwise use your arms or chest as tolerated, with limits based upon whether or not activities lead to the return of significant discomfort.

## 2021-12-17 ENCOUNTER — Other Ambulatory Visit (HOSPITAL_COMMUNITY): Payer: Self-pay

## 2021-12-17 MED ORDER — ANASTROZOLE 1 MG PO TABS: 1.0000 mg | ORAL_TABLET | Freq: Every day | ORAL | 1 refills | Status: DC

## 2021-12-17 NOTE — Telephone Encounter (Signed)
Order placed for Anastrozole to be taken daily per Dr. Delton Coombes. Detailed VM left asking that the patient return my call.

## 2021-12-18 ENCOUNTER — Ambulatory Visit (HOSPITAL_COMMUNITY)
Admission: RE | Admit: 2021-12-18 | Discharge: 2021-12-18 | Disposition: A | Discharge status: Home / Self Care | Payer: Medicare HMO | Source: Ambulatory Visit | Attending: Hematology | Admitting: Hematology

## 2021-12-18 DIAGNOSIS — M8588 Other specified disorders of bone density and structure, other site: Secondary | ICD-10-CM | POA: Insufficient documentation

## 2021-12-18 DIAGNOSIS — Z780 Asymptomatic menopausal state: Secondary | ICD-10-CM | POA: Insufficient documentation

## 2021-12-18 DIAGNOSIS — Z13820 Encounter for screening for osteoporosis: Secondary | ICD-10-CM | POA: Insufficient documentation

## 2021-12-18 DIAGNOSIS — Z794 Long term (current) use of insulin: Secondary | ICD-10-CM | POA: Insufficient documentation

## 2021-12-18 DIAGNOSIS — E119 Type 2 diabetes mellitus without complications: Secondary | ICD-10-CM | POA: Diagnosis not present

## 2021-12-18 DIAGNOSIS — Z170 Estrogen receptor positive status [ER+]: Secondary | ICD-10-CM

## 2021-12-18 DIAGNOSIS — C50412 Malignant neoplasm of upper-outer quadrant of left female breast: Secondary | ICD-10-CM | POA: Diagnosis not present

## 2021-12-18 DIAGNOSIS — F17200 Nicotine dependence, unspecified, uncomplicated: Secondary | ICD-10-CM | POA: Diagnosis not present

## 2021-12-21 ENCOUNTER — Telehealth: Payer: Self-pay

## 2021-12-21 NOTE — Telephone Encounter (Signed)
Alexia OT with [facility] [phone] contacted the office for verbal orders for occupational therapy 1x week for 4 weeks. Patient is s/p Cabg with Dr. Cyndia Bent 10/2021. Verbal orders given, will await faxed copy for physician signature.

## 2022-01-05 ENCOUNTER — Telehealth: Payer: Self-pay

## 2022-01-05 ENCOUNTER — Telehealth: Payer: Self-pay | Admitting: Cardiology

## 2022-01-05 DIAGNOSIS — R42 Dizziness and giddiness: Secondary | ICD-10-CM

## 2022-01-05 DIAGNOSIS — I9589 Other hypotension: Secondary | ICD-10-CM

## 2022-01-05 MED ORDER — LOSARTAN POTASSIUM 50 MG PO TABS: 25.0000 mg | ORAL_TABLET | Freq: Every day | ORAL | Status: DC

## 2022-01-05 NOTE — Telephone Encounter (Signed)
Notified Manuela Schwartz w/ [facility] - will fax order to [facility] she can go this week.

## 2022-01-05 NOTE — Telephone Encounter (Signed)
Susan w/ [facility] calling [phone]) - states they have been seeing her since post hospital (end of July).  Last seen in office by Gerrianne Scale on 12/02/2021.  Patient is seeing Dr. Cyndia Bent for f/u this Thursday.  Not due to see Dr. Domenic Polite till October.  No c/o chest pain or SOB.  No fever, pain, or drainage at incision site.  Biggest issue is her low BP - 92/52.  Nurse states she did try standing her up, but was unable to get BP on her as she had to sit back down due to dizziness & having blurry vision.  After sitting for a minutes, she did start to feeling better.  States was only able to get BP up to 100/58.  HR's running 65.  Nurse states that the low BP has been going x 2-3 days and this is abnormal for her.  Typically she has been running in the 130's - 140's / 80's.  States that she also c/o low energy, tiredness, feeing really cold, and no bm in a few days.  [facility] nurse did advise her on going to ED if symptoms worsened.

## 2022-01-05 NOTE — Telephone Encounter (Signed)
Pt c/o BP issue: STAT if pt c/o blurred vision, one-sided weakness or slurred speech  1. What are your last 5 BP readings? 92/52  2. Are you having any other symptoms (ex. Dizziness, headache, blurred vision, passed out)? Dizziness, light headedness, blurred vision   3. What is your BP issue?

## 2022-01-05 NOTE — Telephone Encounter (Signed)
Vinnie Level, OT with [facility] contacted the office [phone] stating patient's BP's have been running lower than normal at 92/52 and 100/58. She states that patient says she does not feel well and has symptoms of dizziness/ lightheadedness. Advised that she should contact patient's Cardiologist, Dr. Domenic Polite for further guidance. She acknowledged receipt.

## 2022-01-07 ENCOUNTER — Encounter: Payer: Self-pay | Admitting: Surgery

## 2022-01-07 ENCOUNTER — Ambulatory Visit (INDEPENDENT_AMBULATORY_CARE_PROVIDER_SITE_OTHER): Payer: Self-pay | Admitting: Surgery

## 2022-01-07 VITALS — BP 132/80 | HR 83 | Resp 20 | Ht 64.0 in | Wt 246.0 lb

## 2022-01-07 DIAGNOSIS — Z951 Presence of aortocoronary bypass graft: Secondary | ICD-10-CM

## 2022-01-07 NOTE — Progress Notes (Signed)
HPI: Patient returns for routine postoperative follow-up having undergone coronary bypass graft surgery x3 on 11/17/2021. The patient's early postoperative recovery while in the hospital was notable for an uncomplicated postoperative course.  She was seen in our office by one of the PAs on 12/16/2021 and was noted to be hypertensive with a blood pressure 170/100.  She is otherwise feeling well.  Her losartan was increased to 50 mg daily.  She continues to feel well and is walking short distances using her rolling walker.  She denies any chest pain or shortness of breath.    Current Outpatient Medications  Medication Sig Dispense Refill   anastrozole (ARIMIDEX) 1 MG tablet Take 1 tablet (1 mg total) by mouth daily. 90 tablet 1   Ascorbic Acid (VITAMIN C) 1000 MG tablet Take 1,000 mg by mouth daily.     aspirin EC 81 MG tablet Take 1 tablet (81 mg total) by mouth daily. Swallow whole. 30 tablet 12   Calcium Carb-Cholecalciferol (CALCIUM-VITAMIN D3) 600-400 MG-UNIT TABS Take 1 capsule by mouth daily.     cholecalciferol (VITAMIN D3) 25 MCG (1000 UNIT) tablet Take 1,000 Units by mouth daily.     clopidogrel (PLAVIX) 75 MG tablet TAKE 1 TABLET BY MOUTH EVERY DAY 90 tablet 3   cyclobenzaprine (FLEXERIL) 10 MG tablet Take 10 mg by mouth 2 (two) times daily as needed for muscle spasms.     DULoxetine (CYMBALTA) 60 MG capsule Take 60 mg by mouth daily.     ezetimibe (ZETIA) 10 MG tablet Take 10 mg by mouth daily.     glimepiride (AMARYL) 4 MG tablet Take 4 mg by mouth daily with breakfast.     hydrOXYzine (ATARAX) 25 MG tablet Take 25 mg by mouth in the morning and at bedtime.     insulin glargine (LANTUS) 100 UNIT/ML injection Inject 40 Units into the skin at bedtime.     isosorbide mononitrate (IMDUR) 60 MG 24 hr tablet Take 1 tablet by mouth daily.     losartan (COZAAR) 50 MG tablet Take 0.5 tablets (25 mg total) by mouth daily.     metFORMIN (GLUCOPHAGE) 500 MG tablet Take 500-1,000 mg by mouth 2  (two) times daily with a meal. Take 2 tablets (1000 mg) in the morning and Take 1 tablets (500 mg) in the evening     metoprolol tartrate (LOPRESSOR) 25 MG tablet Take 1 tablet (25 mg total) by mouth 2 (two) times daily. 60 tablet 1   mometasone (ELOCON) 0.1 % ointment Apply 1 Application topically daily.     nitroGLYCERIN (NITROSTAT) 0.4 MG SL tablet Place 0.4 mg under the tongue every 5 (five) minutes as needed for chest pain.     Omega-3 Fatty Acids (DIALYVITE OMEGA-3 CONCENTRATE) 600 MG CAPS Take 600 mg by mouth daily.     pregabalin (LYRICA) 75 MG capsule Take 75-150 mg by mouth 2 (two) times daily. Take 2 tablets (150 mg) in the morning & Take 1 tablet (75 mg) at bedtime     rosuvastatin (CRESTOR) 10 MG tablet Take 10 mg by mouth daily.     triamcinolone ointment (KENALOG) 0.1 % Apply 1 Application topically daily as needed (For rash).     vitamin B-12 (CYANOCOBALAMIN) 1000 MCG tablet Take 1,000 mcg by mouth daily.     oxyCODONE-acetaminophen (PERCOCET) 10-325 MG tablet Take 1 tablet by mouth every 4 (four) hours as needed for pain. (Patient not taking: Reported on 01/07/2022) 28 tablet 0   No current facility-administered medications  for this visit.    Physical Exam: BP 132/80   Pulse 83   Resp 20   Ht '5\' 4"'$  (1.626 m)   Wt 246 lb (111.6 kg)   SpO2 96% Comment: RA  BMI 42.23 kg/m  She looks well. Cardiac exam shows regular rate and rhythm with normal heart sounds. Lungs are clear. The chest incision is well-healed and the sternum is stable. There is no peripheral edema.  Her right leg incision is well-healed.  Diagnostic Tests:  None today  Impression:  She is doing well almost 2 months out from her coronary bypass surgery.  I encouraged her to continue increasing the distance that she is walking to build up more stamina.  I asked her not to lift anything heavier than 10 pounds for 3 months postoperatively.  Plan:  She will continue to follow-up with cardiology and will  return to see me if she has any problems with her incisions.   Gaye Pollack, MD [facility] [phone]

## 2022-01-28 ENCOUNTER — Encounter: Payer: Self-pay | Admitting: *Deleted

## 2022-02-16 ENCOUNTER — Inpatient Hospital Stay: Payer: Medicare HMO | Admitting: Hematology

## 2022-03-03 ENCOUNTER — Ambulatory Visit: Payer: Medicare HMO | Admitting: Cardiology

## 2022-03-03 ENCOUNTER — Encounter: Payer: Self-pay | Admitting: Cardiology

## 2022-03-03 NOTE — Progress Notes (Deleted)
Cardiology Office Note  Date: 03/03/2022   ID: Katelyn Kaufman, DOB 07-27-1950, MRN [id number]  PCP:  Leeanne Rio, MD  Cardiologist:  Rozann Lesches, MD Electrophysiologist:  None   No chief complaint on file.   History of Present Illness: Katelyn Kaufman is a 71 y.o. female last seen in July by Katelyn Kaufman.  I reviewed interval records since our last encounter and updated the chart.  She is status post NSTEMI in June with documentation of severe trifurcation disease involving the left main, ostial circumflex, and ostial ramus status post emergent CABG with LIMA to LAD, SVG to OM, and SVG to ramus intermedius.  LVEF 70% by intraoperative TEE.  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   COPD (chronic obstructive pulmonary disease) ([place])    Essential hypertension    Hyperlipidemia    Myocardial infarction ([place])    [place] to the LAD   OSA (obstructive sleep apnea)    CPAP qHS   Type 2 diabetes mellitus ([place])     Past Surgical History:  Procedure Laterality Date   BACK SURGERY     BREAST SURGERY     CATARACT EXTRACTION W/PHACO Right 10/24/2020   Procedure: CATARACT EXTRACTION PHACO AND INTRAOCULAR LENS PLACEMENT (IOC);  Surgeon: Baruch Goldmann, MD;  Location: [facility];  Service: Ophthalmology;  Laterality: Right;  CDE: 10.87   CATARACT EXTRACTION W/PHACO Left 12/01/2020   Procedure: CATARACT EXTRACTION PHACO AND INTRAOCULAR LENS PLACEMENT LEFT EYE;  Surgeon: Baruch Goldmann, MD;  Location: [facility];  Service: Ophthalmology;  Laterality: Left;  CDE  8.62   CORONARY ARTERY BYPASS GRAFT N/A 11/16/2021   Procedure: CORONARY ARTERY BYPASS GRAFTING (CABG) times three using the left internal mammary and right saphenous vein.;  Surgeon: Gaye Pollack, MD;  Location: [facility];  Service: Open Heart Surgery;  Laterality: N/A;   IABP INSERTION N/A 11/16/2021   Procedure: IABP Insertion;  Surgeon: Leonie Man, MD;  Location: [facility];  Service: Cardiovascular;  Laterality: N/A;   LEFT HEART CATH AND CORONARY ANGIOGRAPHY N/A 11/16/2021   Procedure: LEFT HEART CATH AND CORONARY ANGIOGRAPHY;  Surgeon: Leonie Man, MD;  Location: [facility];  Service: Cardiovascular;  Laterality: N/A;   REPLACEMENT TOTAL KNEE Right    TEE WITHOUT CARDIOVERSION N/A 11/16/2021   Procedure: TRANSESOPHAGEAL ECHOCARDIOGRAM (TEE);  Surgeon: Gaye Pollack, MD;  Location: [facility];  Service: Open Heart Surgery;  Laterality: N/A;   TUBAL LIGATION      Current Outpatient Medications  Medication Sig Dispense Refill   anastrozole (ARIMIDEX) 1 MG tablet Take 1 tablet (1 mg total) by mouth daily. 90 tablet 1   Ascorbic Acid (VITAMIN C) 1000 MG tablet Take 1,000 mg by mouth daily.     aspirin EC 81 MG tablet Take 1 tablet (81 mg total) by mouth daily. Swallow whole. 30 tablet 12   Calcium Carb-Cholecalciferol (CALCIUM-VITAMIN D3) 600-400 MG-UNIT TABS Take 1 capsule by mouth daily.     cholecalciferol (VITAMIN D3) 25 MCG (1000 UNIT) tablet Take 1,000 Units by mouth daily.     clopidogrel (PLAVIX) 75 MG tablet TAKE 1 TABLET BY MOUTH EVERY DAY 90 tablet 3   cyclobenzaprine (FLEXERIL) 10 MG tablet Take 10 mg  by mouth 2 (two) times daily as needed for muscle spasms.     DULoxetine (CYMBALTA) 60 MG capsule Take 60 mg by mouth daily.     ezetimibe (ZETIA) 10 MG tablet Take 10 mg by mouth daily.     glimepiride (AMARYL) 4 MG tablet Take 4 mg by mouth daily with breakfast.     hydrOXYzine (ATARAX) 25 MG tablet Take 25 mg by mouth in the morning and at bedtime.     insulin glargine (LANTUS) 100 UNIT/ML injection Inject 40 Units into the skin at bedtime.     isosorbide mononitrate (IMDUR) 60 MG 24 hr tablet Take 1 tablet by mouth daily.     losartan (COZAAR) 50 MG  tablet Take 0.5 tablets (25 mg total) by mouth daily.     metFORMIN (GLUCOPHAGE) 500 MG tablet Take 500-1,000 mg by mouth 2 (two) times daily with a meal. Take 2 tablets (1000 mg) in the morning and Take 1 tablets (500 mg) in the evening     metoprolol tartrate (LOPRESSOR) 25 MG tablet Take 1 tablet (25 mg total) by mouth 2 (two) times daily. 60 tablet 1   mometasone (ELOCON) 0.1 % ointment Apply 1 Application topically daily.     nitroGLYCERIN (NITROSTAT) 0.4 MG SL tablet Place 0.4 mg under the tongue every 5 (five) minutes as needed for chest pain.     Omega-3 Fatty Acids (DIALYVITE OMEGA-3 CONCENTRATE) 600 MG CAPS Take 600 mg by mouth daily.     oxyCODONE-acetaminophen (PERCOCET) 10-325 MG tablet Take 1 tablet by mouth every 4 (four) hours as needed for pain. (Patient not taking: Reported on 01/07/2022) 28 tablet 0   pregabalin (LYRICA) 75 MG capsule Take 75-150 mg by mouth 2 (two) times daily. Take 2 tablets (150 mg) in the morning & Take 1 tablet (75 mg) at bedtime     rosuvastatin (CRESTOR) 10 MG tablet Take 10 mg by mouth daily.     triamcinolone ointment (KENALOG) 0.1 % Apply 1 Application topically daily as needed (For rash).     vitamin B-12 (CYANOCOBALAMIN) 1000 MCG tablet Take 1,000 mcg by mouth daily.     No current facility-administered medications for this visit.   Allergies:  Latex   Social History: The patient  reports that she has been smoking cigarettes. She has a 20.00 pack-year smoking history. She has never used smokeless tobacco. She reports that she does not drink alcohol and does not use drugs.   Family History: The patient's family history includes Alcohol abuse in her father; COPD in her mother; Heart attack in her brother; Hypertension in her sister.   ROS:  Please see the history of present illness. Otherwise, complete review of systems is positive for {NONE DEFAULTED:18576}.  All other systems are reviewed and negative.   Physical Exam: VS:  There were no vitals  taken for this visit., BMI There is no height or weight on file to calculate BMI.  Wt Readings from Last 3 Encounters:  01/07/22 246 lb (111.6 kg)  12/16/21 255 lb (115.7 kg)  12/15/21 256 lb (116.1 kg)    General: Patient appears comfortable at rest. HEENT: Conjunctiva and lids normal, oropharynx clear with moist mucosa. Neck: Supple, no elevated JVP or carotid bruits, no thyromegaly. Lungs: Clear to auscultation, nonlabored breathing at rest. Cardiac: Regular rate and rhythm, no S3 or significant systolic murmur, no pericardial rub. Abdomen: Soft, nontender, no hepatomegaly, bowel sounds present, no guarding or rebound. Extremities: No pitting edema, distal pulses 2+. Skin: Warm  and dry. Musculoskeletal: No kyphosis. Neuropsychiatric: Alert and oriented x3, affect grossly appropriate.  ECG:  An ECG dated 11/16/2021 was personally reviewed today and demonstrated:  Sinus rhythm with nonspecific ST changes.  Recent Labwork: March 2023: Cholesterol 144, triglycerides 142, HDL 43, LDL 76 07/28/2021: B Natriuretic Peptide 86.0 11/16/2021: ALT 15; AST 16 11/17/2021: Magnesium 2.6 11/20/2021: BUN 7; Creatinine, Ser 1.03; Potassium 4.0; Sodium 134 11/22/2021: Hemoglobin 11.9; Platelets 194  June 2023: LP(a) 36  Other Studies Reviewed Today:  Cardiac catheterization 11/16/2021:   Mid LM to Prox LAD lesion is 95% stenosed with 99% stenosed side branch in Ost Cx.  Ramus lesion is 95% stenosed. - Trifucation lesion   Mid LAD stent is 5% stenosed.  Dist LAD stent is 10% stenosed.   Prox RCA lesion is 45% stenosed.   The left ventricular systolic function is normal.  The left ventricular ejection fraction is 50-55% by visual estimate.   There is no aortic valve stenosis.   ----------------------- Severe distal LM-trifurcation LAD, RI, LCx 95-99 % eccentric stenosis with minimal downstream disease. LAD has mid and distal vessel stent with diffuse calcification.  Stents are widely patent.  LAD  reaches the apex.  1 very proximal ramus like small diagonal branch followed by 2 additional diagonal branches.  Too small for bypass. Ramus or medius is a large-caliber vessel that reaches the apex with distal branches. LCx courses as of the lateral OM/LPL distally after giving rise to small to moderate-sized OM1, OM 2 and OM 4 with minuscule OM 3.  Too small for grafting. RCA has proximal eccentric 45 to 50% stenosis with minimal distal disease giving rise to RPDA and 2 PL branches.  No significant disease in the RCA system. Preserved LVEF with mild mid to apical anterior hypokinesis. Hemodynamics:  Central AoP: 176/89 with a MAP 124 mmHg LVP/EDP: 172/5-15 mmHg  TEE 11/16/2021: POST-OP IMPRESSIONS  _ Left Ventricle: has hyperdynamic systolic function, with an ejection  fraction  of 70%. The cavity size was normal. The wall motion is normal.  _ Right Ventricle: normal function. The cavity was normal. The wall motion  is  normal.  _ Aorta: there is no dissection present in the aorta.  _ Aortic Valve: The aortic valve appears unchanged from pre-bypass.  _ Mitral Valve: There is mild regurgitation.  _ Tricuspid Valve: There is mild regurgitation.   Assessment and Plan:    Medication Adjustments/Labs and Tests Ordered: Current medicines are reviewed at length with the patient today.  Concerns regarding medicines are outlined above.   Tests Ordered: No orders of the defined types were placed in this encounter.   Medication Changes: No orders of the defined types were placed in this encounter.   Disposition:  Follow up {follow up:15908}  Signed, Satira Sark, MD, [facility] 03/03/2022 8:20 AM    [address], [place], [place] [address] Phone: [phone]; Fax: [phone]

## 2022-04-06 ENCOUNTER — Ambulatory Visit: Payer: Medicare HMO | Admitting: General Surgery

## 2022-04-13 ENCOUNTER — Encounter: Payer: Self-pay | Admitting: General Surgery

## 2022-04-13 ENCOUNTER — Ambulatory Visit (INDEPENDENT_AMBULATORY_CARE_PROVIDER_SITE_OTHER): Payer: Medicare HMO | Admitting: General Surgery

## 2022-04-13 VITALS — BP 164/88 | HR 76 | Temp 98.1°F | Resp 16 | Ht 64.0 in | Wt 241.0 lb

## 2022-04-13 DIAGNOSIS — C50912 Malignant neoplasm of unspecified site of left female breast: Secondary | ICD-10-CM

## 2022-04-13 NOTE — Progress Notes (Signed)
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  She has been on Arimdex for her breast cancer.  Her cancer treatment has been delayed due to an MI with CABG in June of this year.  She has been recovering well.  She is having a follow-up with Dr. Domenic Polite of Cardiology in December.  As she will be 6 months out from her MI, I anticipate proceeding with a left simple mastectomy in January once cleared by cardiology.  We will see her soon after her visit with Dr. Domenic Polite on 05/18/2022.

## 2022-04-16 ENCOUNTER — Other Ambulatory Visit: Payer: Self-pay | Admitting: Physician Assistant

## 2022-04-21 ENCOUNTER — Telehealth: Payer: Self-pay | Admitting: Cardiology

## 2022-04-21 MED ORDER — NITROGLYCERIN 0.4 MG SL SUBL: 0.4000 mg | SUBLINGUAL_TABLET | SUBLINGUAL | 3 refills | Status: DC | PRN

## 2022-04-21 NOTE — Telephone Encounter (Signed)
 *  STAT* If patient is at the pharmacy, call can be transferred to refill team.   1. Which medications need to be refilled? (please list name of each medication and dose if known)   nitroGLYCERIN (NITROSTAT) 0.4 MG SL tablet    2. Which pharmacy/location (including street and city if local pharmacy) is medication to be sent to?  [facility] [address]    3. Do they need a 30 day or 90 day supply? 1 bottle

## 2022-04-21 NOTE — Telephone Encounter (Signed)
Completed.

## 2022-04-29 ENCOUNTER — Ambulatory Visit: Payer: Medicare HMO | Admitting: Hematology

## 2022-05-18 ENCOUNTER — Other Ambulatory Visit (HOSPITAL_COMMUNITY)
Admission: RE | Admit: 2022-05-18 | Discharge: 2022-05-18 | Disposition: A | Discharge status: Home / Self Care | Payer: Medicare HMO | Source: Ambulatory Visit | Attending: Medical | Admitting: Medical

## 2022-05-18 ENCOUNTER — Ambulatory Visit: Payer: Medicare HMO | Attending: Cardiology | Admitting: Medical

## 2022-05-18 ENCOUNTER — Encounter: Payer: Self-pay | Admitting: Medical

## 2022-05-18 VITALS — BP 156/84 | HR 70 | Ht 64.0 in | Wt 237.0 lb

## 2022-05-18 DIAGNOSIS — I2510 Atherosclerotic heart disease of native coronary artery without angina pectoris: Secondary | ICD-10-CM | POA: Diagnosis present

## 2022-05-18 DIAGNOSIS — E782 Mixed hyperlipidemia: Secondary | ICD-10-CM | POA: Diagnosis not present

## 2022-05-18 DIAGNOSIS — I10 Essential (primary) hypertension: Secondary | ICD-10-CM | POA: Diagnosis not present

## 2022-05-18 DIAGNOSIS — Z01818 Encounter for other preprocedural examination: Secondary | ICD-10-CM | POA: Diagnosis present

## 2022-05-18 DIAGNOSIS — Z794 Long term (current) use of insulin: Secondary | ICD-10-CM

## 2022-05-18 DIAGNOSIS — E118 Type 2 diabetes mellitus with unspecified complications: Secondary | ICD-10-CM

## 2022-05-18 LAB — LIPID PANEL
Cholesterol: 130 mg/dL (ref 0–200)
HDL: 37 mg/dL — ABNORMAL LOW (ref >40)
LDL Cholesterol: 74 mg/dL (ref 0–99)
Total CHOL/HDL Ratio: 3.5 RATIO
Triglycerides: 97 mg/dL (ref <150)
VLDL: 19 mg/dL (ref 0–40)

## 2022-05-18 LAB — CBC
HCT: 38.6 % (ref 36.0–46.0)
Hemoglobin: 13.1 g/dL (ref 12.0–15.0)
MCH: 30.8 pg (ref 26.0–34.0)
MCHC: 33.9 g/dL (ref 30.0–36.0)
MCV: 90.6 fL (ref 80.0–100.0)
Platelets: 220 10*3/uL (ref 150–400)
RBC: 4.26 MIL/uL (ref 3.87–5.11)
RDW: 14.7 % (ref 11.5–15.5)
WBC: 5.6 10*3/uL (ref 4.0–10.5)
nRBC: 0 % (ref 0.0–0.2)

## 2022-05-18 LAB — COMPREHENSIVE METABOLIC PANEL
ALT: 12 U/L (ref 0–44)
AST: 18 U/L (ref 15–41)
Albumin: 3.6 g/dL (ref 3.5–5.0)
Alkaline Phosphatase: 51 U/L (ref 38–126)
Anion gap: 8 (ref 5–15)
BUN: 10 mg/dL (ref 8–23)
CO2: 24 mmol/L (ref 22–32)
Calcium: 9.9 mg/dL (ref 8.9–10.3)
Chloride: 104 mmol/L (ref 98–111)
Creatinine, Ser: 1.01 mg/dL — ABNORMAL HIGH (ref 0.44–1.00)
GFR, Estimated: 60 mL/min — ABNORMAL LOW (ref >60)
Glucose, Bld: 133 mg/dL — ABNORMAL HIGH (ref 70–99)
Potassium: 3.6 mmol/L (ref 3.5–5.1)
Sodium: 136 mmol/L (ref 135–145)
Total Bilirubin: 0.4 mg/dL (ref 0.3–1.2)
Total Protein: 7.3 g/dL (ref 6.5–8.1)

## 2022-05-18 NOTE — Progress Notes (Signed)
Cardiology Office Note:    Date:  05/18/2022   ID:  [name], DOB 11-May-1951, MRN [id number]  PCP:  Leeanne Rio, MD  [facility] Cardiologist:  Rozann Lesches, MD  [place] Electrophysiologist:  None   Referring MD: Leeanne Rio, MD   Chief Complaint: 2 month follow-up  History of Present Illness:    Katelyn Kaufman is a 71 y.o. female with a hx of HTN, HLD, DM, prior tobacco use, reported h/o CAD in [place] s/p CABG x3 in 10/2021 who presents for follow-up.   Patient reported 3 separate heart attacks in 2017, 2018, and 2019, receiving a stent with each of these events. She saw Dr. Domenic Polite 06/2020 with chest pain. Lexiscan and echo were both normal.   She was admitted with NSTEMI 10/2021 and found to have severe LM and underwent emergency CABG x 3, LIMA-LAD, SVG-OM, SVG-RI.   Last seen 11/2021 for hospital follow-up and was doing well with PT.  Today, the patient reports she is having chronic back pain that affects her activity level. She has rare chest pain when she is moving around too much. She finished cardiac rehab. She reports some tingling/numbness discomfort on her upper chest near her CABG scar. She reports active breast cancer and needs a surgery, left simple mastectomy. This surgery has been deferred for 6 months due to NSTEMI/CABG. She reports night sweats since the surgery. We will need to ask cardiologist how many days to hold plavix. No date has been set for the surgery.   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   COPD (chronic obstructive pulmonary disease) ([place])    Essential hypertension    Hyperlipidemia    Myocardial infarction ([place])    [place] to the LAD   OSA (obstructive sleep apnea)    CPAP qHS   Type 2  diabetes mellitus ([place])     Past Surgical History:  Procedure Laterality Date   BACK SURGERY     BREAST SURGERY     CATARACT EXTRACTION W/PHACO Right 10/24/2020   Procedure: CATARACT EXTRACTION PHACO AND INTRAOCULAR LENS PLACEMENT (IOC);  Surgeon: Baruch Goldmann, MD;  Location: [facility];  Service: Ophthalmology;  Laterality: Right;  CDE: 10.87   CATARACT EXTRACTION W/PHACO Left 12/01/2020   Procedure: CATARACT EXTRACTION PHACO AND INTRAOCULAR LENS PLACEMENT LEFT EYE;  Surgeon: Baruch Goldmann, MD;  Location: [facility];  Service: Ophthalmology;  Laterality: Left;  CDE  8.62   CORONARY ARTERY BYPASS GRAFT N/A 11/16/2021   Procedure: CORONARY ARTERY BYPASS GRAFTING (CABG) times three using the left internal mammary and right saphenous vein.;  Surgeon: Gaye Pollack, MD;  [facility];  Service: Open Heart Surgery;  Laterality: N/A;   IABP INSERTION N/A 11/16/2021   Procedure: IABP Insertion;  Surgeon: Leonie Man, MD;  Location: [place] [facility];  Service: Cardiovascular;  Laterality: N/A;   LEFT HEART CATH AND CORONARY ANGIOGRAPHY N/A 11/16/2021   Procedure: LEFT HEART CATH AND CORONARY ANGIOGRAPHY;  Surgeon: Leonie Man, MD;  Location: [place] [facility];  Service: Cardiovascular;  Laterality: N/A;   REPLACEMENT TOTAL KNEE Right    TEE WITHOUT CARDIOVERSION N/A 11/16/2021   Procedure: TRANSESOPHAGEAL ECHOCARDIOGRAM (TEE);  Surgeon: Gaye Pollack, MD;  Location: [place];  Service: Open Heart Surgery;  Laterality: N/A;   TUBAL LIGATION      Current Medications: Current Meds  Medication Sig   anastrozole (ARIMIDEX) 1 MG tablet Take 1 tablet (1 mg total) by mouth daily.   Ascorbic Acid (VITAMIN C) 1000 MG tablet Take 1,000 mg by mouth daily.   aspirin EC 81 MG tablet Take 1 tablet (81 mg total) by mouth daily. Swallow whole.   Calcium Carb-Cholecalciferol (CALCIUM-VITAMIN D3) 600-400 MG-UNIT TABS Take 1 capsule by mouth daily.   cholecalciferol (VITAMIN D3) 25 MCG (1000 UNIT) tablet Take  1,000 Units by mouth daily.   clopidogrel (PLAVIX) 75 MG tablet TAKE 1 TABLET BY MOUTH EVERY DAY   cyclobenzaprine (FLEXERIL) 10 MG tablet Take 10 mg by mouth 2 (two) times daily as needed for muscle spasms.   DULoxetine (CYMBALTA) 60 MG capsule Take 60 mg by mouth daily.   ezetimibe (ZETIA) 10 MG tablet Take 10 mg by mouth daily.   glimepiride (AMARYL) 4 MG tablet Take 4 mg by mouth daily with breakfast.   hydrOXYzine (ATARAX) 25 MG tablet Take 25 mg by mouth in the morning and at bedtime.   insulin glargine (LANTUS) 100 UNIT/ML injection Inject 40 Units into the skin at bedtime.   isosorbide mononitrate (IMDUR) 60 MG 24 hr tablet Take 1 tablet by mouth daily.   losartan (COZAAR) 50 MG tablet Take 0.5 tablets (25 mg total) by mouth daily.   metFORMIN (GLUCOPHAGE) 500 MG tablet Take 500-1,000 mg by mouth 2 (two) times daily with a meal. Take 2 tablets (1000 mg) in the morning and Take 1 tablets (500 mg) in the evening   metoprolol tartrate (LOPRESSOR) 25 MG tablet Take 1 tablet (25 mg total) by mouth 2 (two) times daily.   mometasone (ELOCON) 0.1 % ointment Apply 1 Application topically daily.   nitroGLYCERIN (NITROSTAT) 0.4 MG SL tablet Place 1 tablet (0.4 mg total) under the tongue every 5 (five) minutes as needed for chest pain.   Omega-3 Fatty Acids (DIALYVITE OMEGA-3 CONCENTRATE) 600 MG CAPS Take 600 mg by mouth daily.   oxyCODONE-acetaminophen (PERCOCET) 10-325 MG tablet Take 1 tablet by mouth every 4 (four) hours as needed for pain.   pregabalin (LYRICA) 75 MG capsule Take 75-150 mg by mouth 2 (two) times daily. Take 2 tablets (150 mg) in the morning & Take 1 tablet (75 mg) at bedtime   rosuvastatin (CRESTOR) 10 MG tablet Take 10 mg by mouth daily.   triamcinolone ointment (KENALOG) 0.1 % Apply 1 Application topically daily as needed (For rash).   vitamin B-12 (CYANOCOBALAMIN) 1000 MCG tablet Take 1,000 mcg by mouth daily.     Allergies:   Latex   Social History   Socioeconomic  History   Marital status: Widowed    Spouse name: Not on file   Number of children: Not on file   Years of education: Not on file   Highest education level: Not on file  Occupational History   Not on file  Tobacco Use   Smoking status: Every Day    Packs/day: 0.50    Years: 40.00    Total pack years: 20.00    Types: Cigarettes   Smokeless tobacco: Never  Vaping Use   Vaping Use: Never used  Substance and Sexual Activity   Alcohol use: Never   Drug use: Never   Sexual activity: Not Currently  Other Topics Concern   Not on file  Social History Narrative   Not on file  Social Determinants of Health   Financial Resource Strain: Not on file  Food Insecurity: Not on file  Transportation Needs: Not on file  Physical Activity: Not on file  Stress: Not on file  Social Connections: Not on file     Family History: The patient's family history includes Alcohol abuse in her father; COPD in her mother; Heart attack in her brother; Hypertension in her sister.  ROS:   Please see the history of present illness.     All other systems reviewed and are negative.  EKGs/Labs/Other Studies Reviewed:    The following studies were reviewed today:  Echo Intraoperative 06/1306 Complications: No known complications during this procedure.  POST-OP IMPRESSIONS  _ Left Ventricle: has hyperdynamic systolic function, with an ejection  fraction  of 70%. The cavity size was normal. The wall motion is normal.  _ Right Ventricle: normal function. The cavity was normal. The wall motion  is  normal.  _ Aorta: there is no dissection present in the aorta.  _ Aortic Valve: The aortic valve appears unchanged from pre-bypass.  _ Mitral Valve: There is mild regurgitation.  _ Tricuspid Valve: There is mild regurgitation.   PRE-OP FINDINGS   Left Ventricle: The left ventricle has hyperdynamic systolic function,  with an ejection fraction of >65%. The cavity size was normal. No evidence  of left  ventricular regional wall motion abnormalities. There is severe  concentric left ventricular  hypertrophy.   Cardiac cath 11/16/21    Mid LM to Prox LAD lesion is 95% stenosed with 99% stenosed side branch in Ost Cx.  Ramus lesion is 95% stenosed. - Trifucation lesion   Mid LAD stent is 5% stenosed.  Dist LAD stent is 10% stenosed.   Prox RCA lesion is 45% stenosed.   The left ventricular systolic function is normal.  The left ventricular ejection fraction is 50-55% by visual estimate.   There is no aortic valve stenosis.   ----------------------- Severe distal LM-trifurcation LAD, RI, LCx 95-99 % eccentric stenosis with minimal downstream disease. LAD has mid and distal vessel stent with diffuse calcification.  Stents are widely patent.  LAD reaches the apex.  1 very proximal ramus like small diagonal branch followed by 2 additional diagonal branches.  Too small for bypass. Ramus or medius is a large-caliber vessel that reaches the apex with distal branches. LCx courses as of the lateral OM/LPL distally after giving rise to small to moderate-sized OM1, OM 2 and OM 4 with minuscule OM 3.  Too small for grafting. RCA has proximal eccentric 45 to 50% stenosis with minimal distal disease giving rise to RPDA and 2 PL branches.  No significant disease in the RCA system. Preserved LVEF with mild mid to apical anterior hypokinesis. Hemodynamics:  Central AoP: 176/89 with a MAP 124 mmHg LVP/EDP: 172/5-15 mmHg   Recommendations: Urgent/emergent CVTS consultation with Dr. Cyndia Bent performed in the Cath Lab.  Plan is for emergent CABG Continue IABP via RFA Radial sheath sutured in place to be used for arterial line during procedure.  Removal in the CVICU post CABG with TR band placed by catheter supervisor.     Glenetta Hew, M.D., M.S. Interventional Cardiologist    Antiplatelet/Anticoag Recommend dual antiplatelet therapy with Aspirin '81mg'$  daily and Clopidogrel '75mg'$  daily long-term (beyond 12  months) because of Severe native vessel disease. Restart Plavix prior to discharge  Discharge Date Anticipated discharge date to be determined.   Echo 07/2021 1. Left ventricular ejection fraction, by estimation, is 60 to 65%.  The  left ventricle has normal function. The left ventricle has no regional  wall motion abnormalities. There is mild left ventricular hypertrophy.  Left ventricular diastolic parameters  are consistent with Grade I diastolic dysfunction (impaired relaxation).   2. Right ventricular systolic function is normal. The right ventricular  size is normal. Tricuspid regurgitation signal is inadequate for assessing  PA pressure.   3. The mitral valve is grossly normal. Trivial mitral valve  regurgitation.   4. The aortic valve is tricuspid. Aortic valve regurgitation is not  visualized.   5. The inferior vena cava is normal in size with greater than 50%  respiratory variability, suggesting right atrial pressure of 3 mmHg.   Comparison(s): No significant change from prior study.   EKG:  EKG is ordered today.  The ekg ordered today demonstrates NSR 72bpm, TWI aVL  Recent Labs: 07/28/2021: B Natriuretic Peptide 86.0 11/17/2021: Magnesium 2.6 05/18/2022: ALT 12; BUN 10; Creatinine, Ser 1.01; Hemoglobin 13.1; Platelets 220; Potassium 3.6; Sodium 136  Recent Lipid Panel    Component Value Date/Time   CHOL 130 05/18/2022 1428   TRIG 97 05/18/2022 1428   HDL 37 (L) 05/18/2022 1428   CHOLHDL 3.5 05/18/2022 1428   VLDL 19 05/18/2022 1428   LDLCALC 74 05/18/2022 1428     Physical Exam:    VS:  BP (!) 156/84   Pulse 70   Ht '5\' 4"'$  (1.626 m)   Wt 237 lb (107.5 kg)   SpO2 98%   BMI 40.68 kg/m     Wt Readings from Last 3 Encounters:  05/18/22 237 lb (107.5 kg)  04/13/22 241 lb (109.3 kg)  01/07/22 246 lb (111.6 kg)     GEN:  Well nourished, well developed in no acute distress HEENT: Normal NECK: No JVD; No carotid bruits LYMPHATICS: No lymphadenopathy CARDIAC:  RRR, no murmurs, rubs, gallops RESPIRATORY:  Clear to auscultation without rales, wheezing or rhonchi  ABDOMEN: Soft, non-tender, non-distended MUSCULOSKELETAL:  No edema; No deformity  SKIN: Warm and dry NEUROLOGIC:  Alert and oriented x 3 PSYCHIATRIC:  Normal affect   ASSESSMENT:    1. Pre-op evaluation   2. Coronary artery disease involving native coronary artery of native heart without angina pectoris   3. Essential hypertension   4. Hyperlipidemia, mixed   5. Type 2 diabetes mellitus with complication, with long-term current use of insulin (HCC)    PLAN:    In order of problems listed above:  Preoperative Cardiac evaluation CAD s/p NSTEMI and CABGx3 in 10/2021 Patient had a non-STEMI with emergency CABG x 3 on 11/16/2021.  Patient is overall doing well. She reports rare sharp chest pain with movement.  She also has palpable chest pain on exam, she describes it as numbness and tingling, suspect this may be nerve pain.  She is on Lyrica.  Patient has breast cancer and is to undergo left simple mastectomy.  Initial preop clearance was 10/30/2021.  Surgery was subsequently deferred due to non-STEMI/CABG.  She is now 6 months out.  Patient is taking aspirin and Plavix.  I will discuss with MD how long to hold Plavix prior to surgery.  Can continue aspirin perioperatively.  Patient uses a cane at baseline, not very active.  METs are greater than 4.  EKG today shows normal sinus rhythm with no ischemic changes.  According to the revised cardiac risk index patient is class IV risk, 15% 30-day risk of death, MI, or cardiac arrest.  No further cardiac workup required prior to  surgery.  We will contact surgeons office (Dr. Arnoldo Morale) with further instructions from MD regarding antiplatelet therapy.  HTN BP high, but she has not had medications today. Continue Imdur '60mg'$  daily, Losartan '50mg'$  daily, Lopressor '25mg'$ BID.  HLD LDL 214 on 07/2020. Re-check fasting lipid panel today.  DM2 Followed by PCP.   Recent A1c was 7.1.  Disposition: Follow up in 3 month(s) with MD/APP    Signed, Dhiya Smits Ninfa Meeker, PA-C  05/18/2022 3:11 PM    [facility]

## 2022-05-18 NOTE — Patient Instructions (Signed)
Medication Instructions:  Your physician recommends that you continue on your current medications as directed. Please refer to the Current Medication list given to you today.   Labwork: CBC,CMET,Lipids   Testing/Procedures: None today  Follow-Up: 3 months  Any Other Special Instructions Will Be Listed Below (If Applicable).  If you need a refill on your cardiac medications before your next appointment, please call your pharmacy.

## 2022-06-08 ENCOUNTER — Ambulatory Visit: Payer: Medicare PPO | Admitting: General Surgery

## 2022-06-10 ENCOUNTER — Ambulatory Visit (INDEPENDENT_AMBULATORY_CARE_PROVIDER_SITE_OTHER): Payer: Medicare PPO | Admitting: General Surgery

## 2022-06-10 ENCOUNTER — Encounter: Payer: Self-pay | Admitting: General Surgery

## 2022-06-10 VITALS — BP 166/76 | HR 81 | Temp 97.8°F | Resp 18 | Ht 64.0 in | Wt 236.0 lb

## 2022-06-10 DIAGNOSIS — Z170 Estrogen receptor positive status [ER+]: Secondary | ICD-10-CM

## 2022-06-10 DIAGNOSIS — C50112 Malignant neoplasm of central portion of left female breast: Secondary | ICD-10-CM | POA: Diagnosis not present

## 2022-06-10 DIAGNOSIS — C50912 Malignant neoplasm of unspecified site of left female breast: Secondary | ICD-10-CM

## 2022-06-10 NOTE — Patient Instructions (Signed)
Stop Plavix and aspirin one week prior to surgery.

## 2022-06-10 NOTE — Progress Notes (Signed)
Subjective:     Katelyn Kaufman  Patient returns to schedule her left simple mastectomy.  She has been cleared by cardiology for surgery. Objective:    BP (!) 166/76   Pulse 81   Temp 97.8 F (36.6 C) (Oral)   Resp 18   Ht '5\' 4"'$  (1.626 m)   Wt 236 lb (107 kg)   SpO2 96%   BMI 40.51 kg/m   General:  alert, cooperative, and no distress       Assessment:    Invasive mammary carcinoma, left breast, ER positive    Plan:   Patient is scheduled for a left simple mastectomy on 06/25/2022.  The risks and benefits of the procedure including bleeding, infection, and cardiopulmonary difficulties were fully explained to the patient, who gave informed consent.  She should stop her Plavix and aspirin 1 week prior to the surgery.

## 2022-06-15 NOTE — H&P (Signed)
Katelyn Kaufman; [id number]; [birth year]   HPI Patient is a 72 year old black female who was referred to my care by Dr. Delton Coombes of Oncology for evaluation and treatment of newly diagnosed left breast carcinoma.  This was found on routine mammography.  Several biopsies in the left breast revealed invasive ductal carcinoma, ER positive, PR positive, HER2 positive.  Several days after this procedure, she presented emergency room with worsening chest pain.  She was diagnosed with a non-STEMI with ongoing ischemic symptoms and ultimately underwent emergent CABG.  This was performed on 11/16/2021.  She has since recovered.  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  She denies any family history of breast cancer. Patient has since recovered from her MI and has been cleared by cardiology for surgical intervention. Past Medical History:  Diagnosis Date   Arthritis    CAD (coronary artery disease) 2018   COPD (chronic obstructive pulmonary disease) ([place])    Essential hypertension    Hyperlipidemia    Myocardial infarction ([place])    had MIs in 2017, 2018, and 2019 while living in [place]. all intervened on   OSA (obstructive sleep apnea)    CPAP qHS   Type 2 diabetes mellitus ([place])     Past Surgical History:  Procedure Laterality Date   BACK SURGERY     BREAST SURGERY     CATARACT EXTRACTION W/PHACO Right 10/24/2020   Procedure: CATARACT EXTRACTION PHACO AND INTRAOCULAR LENS PLACEMENT ([place]);  Surgeon: Baruch Goldmann, MD;  Location: [facility];  Service: Ophthalmology;  Laterality: Right;  CDE: 10.87   CATARACT EXTRACTION W/PHACO Left 12/01/2020   Procedure: CATARACT EXTRACTION PHACO AND INTRAOCULAR LENS PLACEMENT LEFT EYE;  Surgeon: Baruch Goldmann, MD;  Location: [facility];  Service: Ophthalmology;  Laterality: Left;  CDE  8.62   CORONARY ARTERY BYPASS GRAFT N/A 11/16/2021   Procedure: CORONARY ARTERY BYPASS GRAFTING (CABG) times three using the left internal mammary and right saphenous vein.;  Surgeon:  Gaye Pollack, MD;  [facility];  Service: Open Heart Surgery;  Laterality: N/A;   IABP INSERTION N/A 11/16/2021   Procedure: IABP Insertion;  Surgeon: Leonie Man, MD;  Location: [facility];  Service: Cardiovascular;  Laterality: N/A;   LEFT HEART CATH AND CORONARY ANGIOGRAPHY N/A 11/16/2021   Procedure: LEFT HEART CATH AND CORONARY ANGIOGRAPHY;  Surgeon: Leonie Man, MD;  Location: [facility];  Service: Cardiovascular;  Laterality: N/A;   REPLACEMENT TOTAL KNEE Right    TEE WITHOUT CARDIOVERSION N/A 11/16/2021   Procedure: TRANSESOPHAGEAL ECHOCARDIOGRAM (TEE);  Surgeon: Gaye Pollack, MD;  Location: [facility];  Service: Open Heart Surgery;  Laterality: N/A;   TUBAL LIGATION      Family History  Problem Relation Age of Onset   COPD Mother    Alcohol abuse Father    Hypertension Sister    Heart attack Brother     Current Outpatient Medications on File Prior to Visit  Medication Sig Dispense Refill   Ascorbic Acid (VITAMIN C) 1000 MG tablet Take 1,000 mg by mouth daily.     aspirin EC 81 MG tablet Take 1 tablet (81 mg total) by mouth daily. Swallow whole. 30 tablet 12   Calcium Carb-Cholecalciferol (CALCIUM-VITAMIN D3) 600-400 MG-UNIT TABS Take 1 capsule by mouth daily.     cholecalciferol (VITAMIN D3) 25 MCG (1000 UNIT) tablet Take 1,000 Units by mouth daily.     clopidogrel (PLAVIX) 75 MG tablet TAKE 1 TABLET BY MOUTH EVERY DAY  90 tablet 3   cyclobenzaprine (FLEXERIL) 10 MG tablet Take 10 mg by mouth 2 (two) times daily as needed for muscle spasms.     DULoxetine (CYMBALTA) 60 MG capsule Take 60 mg by mouth daily.     ezetimibe (ZETIA) 10 MG tablet Take 10 mg by mouth daily.     glimepiride (AMARYL) 4 MG tablet Take 4 mg by mouth daily with breakfast.     hydrOXYzine (ATARAX) 25 MG tablet Take 25 mg by mouth in the morning and at bedtime.     insulin glargine (LANTUS) 100 UNIT/ML injection Inject 40 Units into the skin at bedtime.     isosorbide mononitrate  (IMDUR) 60 MG 24 hr tablet Take 1 tablet by mouth daily.     losartan (COZAAR) 25 MG tablet Take 1 tablet (25 mg total) by mouth daily. 30 tablet 1   metFORMIN (GLUCOPHAGE) 500 MG tablet Take 500-1,000 mg by mouth 2 (two) times daily with a meal. Take 2 tablets (1000 mg) in the morning and Take 1 tablets (500 mg) in the evening     metoprolol tartrate (LOPRESSOR) 25 MG tablet Take 1 tablet (25 mg total) by mouth 2 (two) times daily. 60 tablet 1   mometasone (ELOCON) 0.1 % ointment Apply 1 Application topically daily.     nitroGLYCERIN (NITROSTAT) 0.4 MG SL tablet Place 0.4 mg under the tongue every 5 (five) minutes as needed for chest pain.     Omega-3 Fatty Acids (DIALYVITE OMEGA-3 CONCENTRATE) 600 MG CAPS Take 600 mg by mouth daily.     oxyCODONE-acetaminophen (PERCOCET) 10-325 MG tablet Take 1 tablet by mouth every 4 (four) hours as needed for pain. 28 tablet 0   pregabalin (LYRICA) 75 MG capsule Take 75-150 mg by mouth 2 (two) times daily. Take 2 tablets (150 mg) in the morning & Take 1 tablet (75 mg) at bedtime     rosuvastatin (CRESTOR) 10 MG tablet Take 10 mg by mouth daily.     triamcinolone ointment (KENALOG) 0.1 % Apply 1 Application topically daily as needed (For rash).     vitamin B-12 (CYANOCOBALAMIN) 1000 MCG tablet Take 1,000 mcg by mouth daily.     No current facility-administered medications on file prior to visit.    Allergies  Allergen Reactions   Latex Rash    Social History   Substance and Sexual Activity  Alcohol Use Never    Social History   Tobacco Use  Smoking Status Every Day   Packs/day: 0.50   Years: 40.00   Total pack years: 20.00   Types: Cigarettes  Smokeless Tobacco Never    Review of Systems  HENT: Negative.    Eyes:  Positive for blurred vision.  Respiratory:  Positive for cough and wheezing.   Cardiovascular:  Positive for chest pain.  Gastrointestinal:  Positive for abdominal pain, heartburn and nausea.  Genitourinary:  Positive for  frequency and urgency.  Musculoskeletal:  Positive for back pain, joint pain and neck pain.  Skin:  Positive for rash.  Neurological:  Positive for dizziness, sensory change and headaches.  Endo/Heme/Allergies: Negative.   Psychiatric/Behavioral: Negative.      Objective   Vitals:   12/15/21 1122  BP: (!) 151/84  Pulse: 80  Resp: 16  Temp: 98.3 F (36.8 C)  SpO2: 90%    Physical Exam Vitals reviewed. Exam conducted with a chaperone present.  Constitutional:      Appearance: She is obese. She is not ill-appearing.     Comments:  Uses a cane.  HENT:     Head: Normocephalic and atraumatic.  Cardiovascular:     Rate and Rhythm: Normal rate and regular rhythm.     Heart sounds: Normal heart sounds. No murmur heard.    No friction rub. No gallop.  Pulmonary:     Effort: Pulmonary effort is normal. No respiratory distress.     Breath sounds: Normal breath sounds. No stridor. No wheezing, rhonchi or rales.  Skin:    General: Skin is warm and dry.  Neurological:     Mental Status: She is alert and oriented to person, place, and time.   Breast: Increased density at the 2 o'clock position just outside the areola of the left breast.  No axillary lymphadenopathy noted.  No dimpling noted.  No nipple discharge noted.  Old surgical scars present. Left breast reveals no dominant mass, nipple discharge, or dimpling.  The axilla is negative for palpable nodes. Mammogram, ultrasound, and surgical pathology reports reviewed Assessment  Invasive ductal carcinoma of the left breast, ER/PR positive Status post CABG for non-STEMI, 1 month postop. Morbid obesity, hypertension, non-insulin-dependent diabetes mellitus Plan  I agree with Dr. Delton Coombes about proceeding with only a left partial mastectomy.  Patient is scheduled for left simple mastectomy on 06/25/2022.  The risks and benefits of the procedure including bleeding, infection, cardiopulmonary difficulties, and the possibility of a blood  transfusion were fully explained to the patient, who gave informed consent.  She will hold her aspirin and Plavix 1 week prior to surgery.

## 2022-06-22 NOTE — Patient Instructions (Signed)
Katelyn Kaufman  06/22/2022     '@PREFPERIOPPHARMACY'$ @   Your procedure is scheduled on  06/25/2022.   Report to [facility] at  0830  A.M.   Call this number if you have problems the morning of surgery:  [phone]  If you experience any cold or flu symptoms such as cough, fever, chills, shortness of breath, etc. between now and your scheduled surgery, please notify us at the above number.   Remember:  Do not eat or drink after midnight.      Your last dose of aspirin and plavix should be on 06/17/2022.    Take 20 units of your glargine the night before your procedure.     DO NOT take any medications for diabetes the morning of your procedure.    Take these medicines the morning of surgery with A SIP OF WATER         arimidex, flexeril(if needed), cymbalta, hydroxyzine, isosorbide, metoprolol, oxycodone(if needed), lyrica.     Do not wear jewelry, make-up or nail polish.  Do not wear lotions, powders, or perfumes, or deodorant.  Do not shave 48 hours prior to surgery.  Men may shave face and neck.  Do not bring valuables to the hospital.  [facility] is not responsible for any belongings or valuables.  Contacts, dentures or bridgework may not be worn into surgery.  Leave your suitcase in the car.  After surgery it may be brought to your room.  For patients admitted to the hospital, discharge time will be determined by your treatment team.  Patients discharged the day of surgery will not be allowed to drive home and must have someone with them for 24 hours.    Special instructions:   DO NOT smoke tobacco or vape for 24 hours before your procedure.  Please read over the following fact sheets that you were given. Pain Booklet, Coughing and Deep Breathing, Blood Transfusion Information, Surgical Site Infection Prevention, Anesthesia Post-op Instructions, and Care and Recovery After Surgery        Lumpectomy, Care After The following information offers  guidance on how to care for yourself after your procedure. Your health care provider may also give you more specific instructions. If you have problems or questions, contact your health care provider. What can I expect after the procedure? After the procedure, it is common to have: Some pain or redness at the incision site. Breast swelling. Breast tenderness. Stiffness in your arm or shoulder. A change in the shape and feel of your breast. Scar tissue that feels hard to the touch in the area where the lump was removed. Follow these instructions at home: Medicines Take over-the-counter and prescription medicines only as told by your health care provider. If you were prescribed an antibiotic, take it as told by your health care provider. Do not stop taking the antibiotic even if you start to feel better. Ask your health care provider if the medicine prescribed to you: Requires you to avoid driving or using machinery. Can cause constipation. You may need to take these actions to prevent or treat constipation: Drink enough fluid to keep your urine pale yellow. Take over-the-counter or prescription medicines. Eat foods that are high in fiber, such as beans, whole grains, and fresh fruits and vegetables. Limit foods that are high in fat and processed sugars, such as fried or sweet foods. Incision care     Follow instructions from your health care provider about how to take  care of your incision. Make sure you: Wash your hands with soap and water for at least 20 seconds before and after you change your bandage (dressing). If soap and water are not available, use hand sanitizer. Change your dressing as told by your health care provider. Leave stitches (sutures), skin glue, or adhesive strips in place. These skin closures may need to stay in place for 2 weeks or longer. If adhesive strip edges start to loosen and curl up, you may trim the loose edges. Do not remove adhesive strips completely unless  your health care provider tells you to do that. Check your incision area every day for signs of infection. Check for: More redness, swelling, or pain. Fluid or blood. Warmth. Pus or a bad smell. Keep your dressing clean and dry. If you were sent home with a surgical drain in place, follow instructions from your health care provider about emptying it. Bathing Do not take baths, swim, or use a hot tub until your health care provider approves. Ask your health care provider if you may take showers. You may only be allowed to take sponge baths. Activity Rest as told by your health care provider. Do not sit for a long time without moving. Get up to take short walks every 1-2 hours. This will improve blood flow and breathing. Ask for help if you feel weak or unsteady. Be careful to avoid any activities that could cause an injury to your arm on the side of your surgery. Do not lift anything that is heavier than 10 lb (4.5 kg), or the limit that you are told, until your health care provider says that it is safe. Avoid lifting with the arm that is on the side of your surgery. Do not carry heavy objects on your shoulder on the side of your surgery. Do exercises to keep your shoulder and arm from getting stiff and swollen. Talk with your health care provider about which exercises are safe for you. Return to your normal activities as told by your health care provider. Ask your health care provider what activities are safe for you. General instructions Wear a supportive bra as told by your health care provider. Raise (elevate) your arm above the level of your heart while you are sitting or lying down. Do not wear tight jewelry on your arm, wrist, or fingers on the side of your surgery. Wear compression stockings as told by your health care provider. These stockings help to prevent blood clots and reduce swelling in your legs. If you had any lymph nodes removed during your procedure, be sure to tell all of  your health care providers. It is important to share this information before you have certain procedures, such as blood tests or blood pressure measurements. Keep all follow-up visits. You may need to be screened for extra fluid around the lymph nodes and swelling in the breast and arm (lymphedema). Contact a health care provider if: You develop a rash. You have a fever. Your pain worsens or pain medicine is not working. You have swelling, weakness, or numbness in your arm that does not improve after a few weeks. You have new swelling in your breast. You have any of these signs of infection: More redness, swelling, or pain in your incision area. Fluid or blood coming from your incision. Warmth coming from the incision area. Pus or a bad smell coming from your incision. Get help right away if: You have very bad pain in your breast or arm. You have  swelling in your legs or arms. You have redness, warmth, or pain in your leg or arm. You have chest pain. You have difficulty breathing. These symptoms may be an emergency. Get help right away. Call 911. Do not wait to see if the symptoms will go away. Do not drive yourself to the hospital. Summary After the procedure, it is common to have breast tenderness, swelling in your breast, and stiffness in your arm and shoulder. Follow instructions from your health care provider about how to take care of your incision. Do not lift anything that is heavier than 10 lb (4.5 kg), or the limit that you are told, until your health care provider says that it is safe. Avoid lifting with the arm that is on the side of your surgery. If you had any lymph nodes removed during your procedure, be sure to tell all of your health care providers. This information is not intended to replace advice given to you by your health care provider. Make sure you discuss any questions you have with your health care provider. Document Revised: 07/19/2021 Document Reviewed:  07/19/2021 Elsevier Patient Education  [place] Anesthesia, Adult, Care After The following information offers guidance on how to care for yourself after your procedure. Your health care provider may also give you more specific instructions. If you have problems or questions, contact your health care provider. What can I expect after the procedure? After the procedure, it is common for people to: Have pain or discomfort at the IV site. Have nausea or vomiting. Have a sore throat or hoarseness. Have trouble concentrating. Feel cold or chills. Feel weak, sleepy, or tired (fatigue). Have soreness and body aches. These can affect parts of the body that were not involved in surgery. Follow these instructions at home: For the time period you were told by your health care provider:  Rest. Do not participate in activities where you could fall or become injured. Do not drive or use machinery. Do not drink alcohol. Do not take sleeping pills or medicines that cause drowsiness. Do not make important decisions or sign legal documents. Do not take care of children on your own. General instructions Drink enough fluid to keep your urine pale yellow. If you have sleep apnea, surgery and certain medicines can increase your risk for breathing problems. Follow instructions from your health care provider about wearing your sleep device: Anytime you are sleeping, including during daytime naps. While taking prescription pain medicines, sleeping medicines, or medicines that make you drowsy. Return to your normal activities as told by your health care provider. Ask your health care provider what activities are safe for you. Take over-the-counter and prescription medicines only as told by your health care provider. Do not use any products that contain nicotine or tobacco. These products include cigarettes, chewing tobacco, and vaping devices, such as e-cigarettes. These can delay incision  healing after surgery. If you need help quitting, ask your health care provider. Contact a health care provider if: You have nausea or vomiting that does not get better with medicine. You vomit every time you eat or drink. You have pain that does not get better with medicine. You cannot urinate or have bloody urine. You develop a skin rash. You have a fever. Get help right away if: You have trouble breathing. You have chest pain. You vomit blood. These symptoms may be an emergency. Get help right away. Call 911. Do not wait to see if the symptoms will go away. Do  not drive yourself to the hospital. Summary After the procedure, it is common to have a sore throat, hoarseness, nausea, vomiting, or to feel weak, sleepy, or fatigue. For the time period you were told by your health care provider, do not drive or use machinery. Get help right away if you have difficulty breathing, have chest pain, or vomit blood. These symptoms may be an emergency. This information is not intended to replace advice given to you by your health care provider. Make sure you discuss any questions you have with your health care provider. Document Revised: 08/07/2021 Document Reviewed: 08/07/2021 Elsevier Patient Education  [place]. How to Use Chlorhexidine Before Surgery Chlorhexidine gluconate (CHG) is a germ-killing (antiseptic) solution that is used to clean the skin. It can get rid of the bacteria that normally live on the skin and can keep them away for about 24 hours. To clean your skin with CHG, you may be given: A CHG solution to use in the shower or as part of a sponge bath. A prepackaged cloth that contains CHG. Cleaning your skin with CHG may help lower the risk for infection: While you are staying in the intensive care unit of the hospital. If you have a vascular access, such as a central line, to provide short-term or long-term access to your veins. If you have a catheter to drain urine from  your bladder. If you are on a ventilator. A ventilator is a machine that helps you breathe by moving air in and out of your lungs. After surgery. What are the risks? Risks of using CHG include: A skin reaction. Hearing loss, if CHG gets in your ears and you have a perforated eardrum. Eye injury, if CHG gets in your eyes and is not rinsed out. The CHG product catching fire. Make sure that you avoid smoking and flames after applying CHG to your skin. Do not use CHG: If you have a chlorhexidine allergy or have previously reacted to chlorhexidine. On babies younger than 63 months of age. How to use CHG solution Use CHG only as told by your health care provider, and follow the instructions on the label. Use the full amount of CHG as directed. Usually, this is one bottle. During a shower Follow these steps when using CHG solution during a shower (unless your health care provider gives you different instructions): Start the shower. Use your normal soap and shampoo to wash your face and hair. Turn off the shower or move out of the shower stream. Pour the CHG onto a clean washcloth. Do not use any type of brush or rough-edged sponge. Starting at your neck, lather your body down to your toes. Make sure you follow these instructions: If you will be having surgery, pay special attention to the part of your body where you will be having surgery. Scrub this area for at least 1 minute. Do not use CHG on your head or face. If the solution gets into your ears or eyes, rinse them well with water. Avoid your genital area. Avoid any areas of skin that have broken skin, cuts, or scrapes. Scrub your back and under your arms. Make sure to wash skin folds. Let the lather sit on your skin for 1-2 minutes or as long as told by your health care provider. Thoroughly rinse your entire body in the shower. Make sure that all body creases and crevices are rinsed well. Dry off with a clean towel. Do not put any  substances on your body afterward--such  as powder, lotion, or perfume--unless you are told to do so by your health care provider. Only use lotions that are recommended by the manufacturer. Put on clean clothes or pajamas. If it is the night before your surgery, sleep in clean sheets.  During a sponge bath Follow these steps when using CHG solution during a sponge bath (unless your health care provider gives you different instructions): Use your normal soap and shampoo to wash your face and hair. Pour the CHG onto a clean washcloth. Starting at your neck, lather your body down to your toes. Make sure you follow these instructions: If you will be having surgery, pay special attention to the part of your body where you will be having surgery. Scrub this area for at least 1 minute. Do not use CHG on your head or face. If the solution gets into your ears or eyes, rinse them well with water. Avoid your genital area. Avoid any areas of skin that have broken skin, cuts, or scrapes. Scrub your back and under your arms. Make sure to wash skin folds. Let the lather sit on your skin for 1-2 minutes or as long as told by your health care provider. Using a different clean, wet washcloth, thoroughly rinse your entire body. Make sure that all body creases and crevices are rinsed well. Dry off with a clean towel. Do not put any substances on your body afterward--such as powder, lotion, or perfume--unless you are told to do so by your health care provider. Only use lotions that are recommended by the manufacturer. Put on clean clothes or pajamas. If it is the night before your surgery, sleep in clean sheets. How to use CHG prepackaged cloths Only use CHG cloths as told by your health care provider, and follow the instructions on the label. Use the CHG cloth on clean, dry skin. Do not use the CHG cloth on your head or face unless your health care provider tells you to. When washing with the CHG cloth: Avoid  your genital area. Avoid any areas of skin that have broken skin, cuts, or scrapes. Before surgery Follow these steps when using a CHG cloth to clean before surgery (unless your health care provider gives you different instructions): Using the CHG cloth, vigorously scrub the part of your body where you will be having surgery. Scrub using a back-and-forth motion for 3 minutes. The area on your body should be completely wet with CHG when you are done scrubbing. Do not rinse. Discard the cloth and let the area air-dry. Do not put any substances on the area afterward, such as powder, lotion, or perfume. Put on clean clothes or pajamas. If it is the night before your surgery, sleep in clean sheets.  For general bathing Follow these steps when using CHG cloths for general bathing (unless your health care provider gives you different instructions). Use a separate CHG cloth for each area of your body. Make sure you wash between any folds of skin and between your fingers and toes. Wash your body in the following order, switching to a new cloth after each step: The front of your neck, shoulders, and chest. Both of your arms, under your arms, and your hands. Your stomach and groin area, avoiding the genitals. Your right leg and foot. Your left leg and foot. The back of your neck, your back, and your buttocks. Do not rinse. Discard the cloth and let the area air-dry. Do not put any substances on your body afterward--such as powder, lotion,  or perfume--unless you are told to do so by your health care provider. Only use lotions that are recommended by the manufacturer. Put on clean clothes or pajamas. Contact a health care provider if: Your skin gets irritated after scrubbing. You have questions about using your solution or cloth. You swallow any chlorhexidine. Call your local poison control center (1-(601) 297-3940 in the [place] Get help right away if: Your eyes itch badly, or they become very red or  swollen. Your skin itches badly and is red or swollen. Your hearing changes. You have trouble seeing. You have swelling or tingling in your mouth or throat. You have trouble breathing. These symptoms may represent a serious problem that is an emergency. Do not wait to see if the symptoms will go away. Get medical help right away. Call your local emergency services (911 in the [place] Do not drive yourself to the hospital. Summary Chlorhexidine gluconate (CHG) is a germ-killing (antiseptic) solution that is used to clean the skin. Cleaning your skin with CHG may help to lower your risk for infection. You may be given CHG to use for bathing. It may be in a bottle or in a prepackaged cloth to use on your skin. Carefully follow your health care provider's instructions and the instructions on the product label. Do not use CHG if you have a chlorhexidine allergy. Contact your health care provider if your skin gets irritated after scrubbing. This information is not intended to replace advice given to you by your health care provider. Make sure you discuss any questions you have with your health care provider. Document Revised: 09/07/2021 Document Reviewed: 07/21/2020 Elsevier Patient Education  [place].

## 2022-06-23 ENCOUNTER — Encounter (HOSPITAL_COMMUNITY): Payer: Self-pay

## 2022-06-23 ENCOUNTER — Encounter (HOSPITAL_COMMUNITY)
Admission: RE | Admit: 2022-06-23 | Discharge: 2022-06-23 | Disposition: A | Discharge status: Home / Self Care | Payer: Medicare PPO | Source: Ambulatory Visit | Attending: General Surgery | Admitting: General Surgery

## 2022-06-23 ENCOUNTER — Other Ambulatory Visit (HOSPITAL_COMMUNITY): Admission: RE | Admit: 2022-06-23 | Payer: Medicare PPO | Source: Home / Self Care

## 2022-06-23 DIAGNOSIS — Z01818 Encounter for other preprocedural examination: Secondary | ICD-10-CM | POA: Diagnosis not present

## 2022-06-23 HISTORY — DX: Depression, unspecified: F32.A

## 2022-06-23 HISTORY — DX: Other specified postprocedural states: Z98.890

## 2022-06-23 HISTORY — DX: Anxiety disorder, unspecified: F41.9

## 2022-06-23 HISTORY — DX: Nausea with vomiting, unspecified: R11.2

## 2022-06-23 LAB — BASIC METABOLIC PANEL
Anion gap: 8 (ref 5–15)
BUN: 11 mg/dL (ref 8–23)
CO2: 25 mmol/L (ref 22–32)
Calcium: 10.2 mg/dL (ref 8.9–10.3)
Chloride: 107 mmol/L (ref 98–111)
Creatinine, Ser: 1.01 mg/dL — ABNORMAL HIGH (ref 0.44–1.00)
GFR, Estimated: 60 mL/min — ABNORMAL LOW (ref >60)
Glucose, Bld: 170 mg/dL — ABNORMAL HIGH (ref 70–99)
Potassium: 3.3 mmol/L — ABNORMAL LOW (ref 3.5–5.1)
Sodium: 140 mmol/L (ref 135–145)

## 2022-06-23 LAB — CBC WITH DIFFERENTIAL/PLATELET
Abs Immature Granulocytes: 0.01 10*3/uL (ref 0.00–0.07)
Basophils Absolute: 0 10*3/uL (ref 0.0–0.1)
Basophils Relative: 0 %
Eosinophils Absolute: 0.1 10*3/uL (ref 0.0–0.5)
Eosinophils Relative: 1 %
HCT: 37.2 % (ref 36.0–46.0)
Hemoglobin: 12.7 g/dL (ref 12.0–15.0)
Immature Granulocytes: 0 %
Lymphocytes Relative: 57 %
Lymphs Abs: 3.4 10*3/uL (ref 0.7–4.0)
MCH: 31.2 pg (ref 26.0–34.0)
MCHC: 34.1 g/dL (ref 30.0–36.0)
MCV: 91.4 fL (ref 80.0–100.0)
Monocytes Absolute: 0.6 10*3/uL (ref 0.1–1.0)
Monocytes Relative: 11 %
Neutro Abs: 1.9 10*3/uL (ref 1.7–7.7)
Neutrophils Relative %: 31 %
Platelets: 205 10*3/uL (ref 150–400)
RBC: 4.07 MIL/uL (ref 3.87–5.11)
RDW: 14.7 % (ref 11.5–15.5)
WBC: 6 10*3/uL (ref 4.0–10.5)
nRBC: 0 % (ref 0.0–0.2)

## 2022-06-23 LAB — TYPE AND SCREEN
ABO/RH(D): A POS
Antibody Screen: NEGATIVE

## 2022-06-25 ENCOUNTER — Observation Stay (HOSPITAL_COMMUNITY)
Admission: RE | Admit: 2022-06-25 | Discharge: 2022-06-26 | Disposition: A | Discharge status: Home / Self Care | Payer: Medicare PPO | Attending: General Surgery | Admitting: General Surgery

## 2022-06-25 ENCOUNTER — Ambulatory Visit (HOSPITAL_BASED_OUTPATIENT_CLINIC_OR_DEPARTMENT_OTHER): Payer: Medicare PPO | Admitting: Anesthesiology

## 2022-06-25 ENCOUNTER — Encounter (HOSPITAL_COMMUNITY)
Admission: RE | Disposition: A | Discharge status: Home / Self Care | Payer: Self-pay | Source: Home / Self Care | Attending: General Surgery

## 2022-06-25 ENCOUNTER — Other Ambulatory Visit: Payer: Self-pay

## 2022-06-25 ENCOUNTER — Encounter (HOSPITAL_COMMUNITY): Payer: Self-pay | Admitting: General Surgery

## 2022-06-25 ENCOUNTER — Ambulatory Visit (HOSPITAL_COMMUNITY): Payer: Medicare PPO | Admitting: Anesthesiology

## 2022-06-25 DIAGNOSIS — Z170 Estrogen receptor positive status [ER+]: Secondary | ICD-10-CM

## 2022-06-25 DIAGNOSIS — Z794 Long term (current) use of insulin: Secondary | ICD-10-CM | POA: Insufficient documentation

## 2022-06-25 DIAGNOSIS — C50412 Malignant neoplasm of upper-outer quadrant of left female breast: Secondary | ICD-10-CM

## 2022-06-25 DIAGNOSIS — F17210 Nicotine dependence, cigarettes, uncomplicated: Secondary | ICD-10-CM | POA: Insufficient documentation

## 2022-06-25 DIAGNOSIS — Z7982 Long term (current) use of aspirin: Secondary | ICD-10-CM | POA: Diagnosis not present

## 2022-06-25 DIAGNOSIS — Z7984 Long term (current) use of oral hypoglycemic drugs: Secondary | ICD-10-CM | POA: Insufficient documentation

## 2022-06-25 DIAGNOSIS — Z79899 Other long term (current) drug therapy: Secondary | ICD-10-CM | POA: Insufficient documentation

## 2022-06-25 DIAGNOSIS — Z7902 Long term (current) use of antithrombotics/antiplatelets: Secondary | ICD-10-CM | POA: Insufficient documentation

## 2022-06-25 DIAGNOSIS — I2510 Atherosclerotic heart disease of native coronary artery without angina pectoris: Secondary | ICD-10-CM | POA: Insufficient documentation

## 2022-06-25 DIAGNOSIS — Z96651 Presence of right artificial knee joint: Secondary | ICD-10-CM | POA: Insufficient documentation

## 2022-06-25 DIAGNOSIS — I10 Essential (primary) hypertension: Secondary | ICD-10-CM | POA: Insufficient documentation

## 2022-06-25 DIAGNOSIS — E119 Type 2 diabetes mellitus without complications: Secondary | ICD-10-CM | POA: Insufficient documentation

## 2022-06-25 DIAGNOSIS — Z951 Presence of aortocoronary bypass graft: Secondary | ICD-10-CM | POA: Diagnosis not present

## 2022-06-25 DIAGNOSIS — Z91040 Latex allergy status: Secondary | ICD-10-CM | POA: Insufficient documentation

## 2022-06-25 DIAGNOSIS — J449 Chronic obstructive pulmonary disease, unspecified: Secondary | ICD-10-CM

## 2022-06-25 DIAGNOSIS — D0592 Unspecified type of carcinoma in situ of left breast: Principal | ICD-10-CM | POA: Insufficient documentation

## 2022-06-25 DIAGNOSIS — Z9012 Acquired absence of left breast and nipple: Secondary | ICD-10-CM

## 2022-06-25 DIAGNOSIS — C50912 Malignant neoplasm of unspecified site of left female breast: Secondary | ICD-10-CM

## 2022-06-25 DIAGNOSIS — Z01818 Encounter for other preprocedural examination: Secondary | ICD-10-CM

## 2022-06-25 HISTORY — PX: SIMPLE MASTECTOMY WITH AXILLARY SENTINEL NODE BIOPSY: SHX6098

## 2022-06-25 LAB — GLUCOSE, CAPILLARY
Glucose-Capillary: 140 mg/dL — ABNORMAL HIGH (ref 70–99)
Glucose-Capillary: 87 mg/dL (ref 70–99)
Glucose-Capillary: 342 mg/dL — ABNORMAL HIGH (ref 70–99)
Glucose-Capillary: 299 mg/dL — ABNORMAL HIGH (ref 70–99)

## 2022-06-25 LAB — HEMOGLOBIN A1C
Hgb A1c MFr Bld: 7.1 % — ABNORMAL HIGH (ref 4.8–5.6)
Mean Plasma Glucose: 157.07 mg/dL

## 2022-06-25 SURGERY — SIMPLE MASTECTOMY
Anesthesia: General | Site: Breast | Laterality: Left

## 2022-06-25 MED ORDER — ONDANSETRON HCL 4 MG/2ML IJ SOLN
INTRAMUSCULAR | Status: DC | PRN
  Administered 2022-06-25: 4 mg via INTRAVENOUS

## 2022-06-25 MED ORDER — EZETIMIBE 10 MG PO TABS
10.0000 mg | ORAL_TABLET | Freq: Every day | ORAL | Status: DC
  Administered 2022-06-26: 10 mg via ORAL
  Filled 2022-06-25: qty 1

## 2022-06-25 MED ORDER — LIDOCAINE HCL (PF) 2 % IJ SOLN
INTRAMUSCULAR | Status: AC
  Filled 2022-06-25: qty 5

## 2022-06-25 MED ORDER — DEXMEDETOMIDINE HCL IN NACL 80 MCG/20ML IV SOLN
INTRAVENOUS | Status: AC
  Filled 2022-06-25: qty 20

## 2022-06-25 MED ORDER — DEXAMETHASONE SODIUM PHOSPHATE 10 MG/ML IJ SOLN
INTRAMUSCULAR | Status: AC
  Filled 2022-06-25: qty 1

## 2022-06-25 MED ORDER — CYCLOBENZAPRINE HCL 10 MG PO TABS: 10.0000 mg | ORAL_TABLET | Freq: Two times a day (BID) | ORAL | Status: DC | PRN

## 2022-06-25 MED ORDER — METOPROLOL TARTRATE 25 MG PO TABS
25.0000 mg | ORAL_TABLET | Freq: Two times a day (BID) | ORAL | Status: DC
  Administered 2022-06-25 – 2022-06-26 (×2): 25 mg via ORAL
  Filled 2022-06-25 (×2): qty 1

## 2022-06-25 MED ORDER — METOPROLOL TARTRATE 5 MG/5ML IV SOLN
INTRAVENOUS | Status: DC | PRN
  Administered 2022-06-25 (×3): 1 mg via INTRAVENOUS

## 2022-06-25 MED ORDER — ONDANSETRON 4 MG PO TBDP: 4.0000 mg | ORAL_TABLET | Freq: Four times a day (QID) | ORAL | Status: DC | PRN

## 2022-06-25 MED ORDER — ACETAMINOPHEN 650 MG RE SUPP: 650.0000 mg | Freq: Four times a day (QID) | RECTAL | Status: DC | PRN

## 2022-06-25 MED ORDER — VITAMIN D 25 MCG (1000 UNIT) PO TABS
1000.0000 [IU] | ORAL_TABLET | Freq: Every day | ORAL | Status: DC
  Administered 2022-06-26: 1000 [IU] via ORAL
  Filled 2022-06-25: qty 1

## 2022-06-25 MED ORDER — ACETAMINOPHEN 325 MG PO TABS: 650.0000 mg | ORAL_TABLET | Freq: Four times a day (QID) | ORAL | Status: DC | PRN

## 2022-06-25 MED ORDER — INSULIN ASPART 100 UNIT/ML IJ SOLN
0.0000 [IU] | Freq: Three times a day (TID) | INTRAMUSCULAR | Status: DC
  Administered 2022-06-25: 15 [IU] via SUBCUTANEOUS
  Administered 2022-06-26: 7 [IU] via SUBCUTANEOUS

## 2022-06-25 MED ORDER — METOPROLOL TARTRATE 5 MG/5ML IV SOLN
INTRAVENOUS | Status: AC
  Filled 2022-06-25: qty 5

## 2022-06-25 MED ORDER — METOCLOPRAMIDE HCL 5 MG/ML IJ SOLN
10.0000 mg | Freq: Once | INTRAMUSCULAR | Status: AC
  Administered 2022-06-25: 10 mg via INTRAVENOUS

## 2022-06-25 MED ORDER — ACETAMINOPHEN 10 MG/ML IV SOLN
INTRAVENOUS | Status: AC
  Filled 2022-06-25: qty 100

## 2022-06-25 MED ORDER — SUGAMMADEX SODIUM 200 MG/2ML IV SOLN
INTRAVENOUS | Status: DC | PRN
  Administered 2022-06-25: 200 mg via INTRAVENOUS

## 2022-06-25 MED ORDER — FENTANYL CITRATE (PF) 100 MCG/2ML IJ SOLN
INTRAMUSCULAR | Status: DC | PRN
  Administered 2022-06-25 (×2): 50 ug via INTRAVENOUS

## 2022-06-25 MED ORDER — CHLORHEXIDINE GLUCONATE 0.12 % MT SOLN
15.0000 mL | Freq: Once | OROMUCOSAL | Status: AC
  Administered 2022-06-25: 15 mL via OROMUCOSAL

## 2022-06-25 MED ORDER — HYDROXYZINE HCL 25 MG PO TABS
25.0000 mg | ORAL_TABLET | Freq: Every day | ORAL | Status: DC
  Administered 2022-06-26: 25 mg via ORAL
  Filled 2022-06-25: qty 1

## 2022-06-25 MED ORDER — 0.9 % SODIUM CHLORIDE (POUR BTL) OPTIME
TOPICAL | Status: DC | PRN
  Administered 2022-06-25: 1000 mL

## 2022-06-25 MED ORDER — VITAMIN C 500 MG PO TABS
1000.0000 mg | ORAL_TABLET | Freq: Every day | ORAL | Status: DC
  Administered 2022-06-26: 1000 mg via ORAL
  Filled 2022-06-25: qty 2

## 2022-06-25 MED ORDER — OXYCODONE HCL 5 MG PO TABS
5.0000 mg | ORAL_TABLET | ORAL | Status: DC | PRN
  Administered 2022-06-25 – 2022-06-26 (×3): 10 mg via ORAL
  Filled 2022-06-25 (×3): qty 2

## 2022-06-25 MED ORDER — CHLORHEXIDINE GLUCONATE CLOTH 2 % EX PADS
6.0000 | MEDICATED_PAD | Freq: Once | CUTANEOUS | Status: DC
  Administered 2022-06-25: 6 via TOPICAL

## 2022-06-25 MED ORDER — FENTANYL CITRATE (PF) 100 MCG/2ML IJ SOLN
INTRAMUSCULAR | Status: AC
  Filled 2022-06-25: qty 2

## 2022-06-25 MED ORDER — DULOXETINE HCL 60 MG PO CPEP
60.0000 mg | ORAL_CAPSULE | Freq: Every day | ORAL | Status: DC
  Administered 2022-06-25 – 2022-06-26 (×2): 60 mg via ORAL
  Filled 2022-06-25 (×2): qty 1

## 2022-06-25 MED ORDER — ONDANSETRON HCL 4 MG/2ML IJ SOLN: 4.0000 mg | Freq: Once | INTRAMUSCULAR | Status: DC | PRN

## 2022-06-25 MED ORDER — DIPHENHYDRAMINE HCL 25 MG PO CAPS
50.0000 mg | ORAL_CAPSULE | Freq: Four times a day (QID) | ORAL | Status: DC | PRN
  Administered 2022-06-25 – 2022-06-26 (×3): 50 mg via ORAL
  Filled 2022-06-25 (×3): qty 2

## 2022-06-25 MED ORDER — ACETAMINOPHEN 10 MG/ML IV SOLN
INTRAVENOUS | Status: DC | PRN
  Administered 2022-06-25: 1000 mg via INTRAVENOUS

## 2022-06-25 MED ORDER — PROPOFOL 10 MG/ML IV BOLUS
INTRAVENOUS | Status: DC | PRN
  Administered 2022-06-25: 150 mg via INTRAVENOUS

## 2022-06-25 MED ORDER — ROSUVASTATIN CALCIUM 10 MG PO TABS
10.0000 mg | ORAL_TABLET | Freq: Every day | ORAL | Status: DC
  Administered 2022-06-25 – 2022-06-26 (×2): 10 mg via ORAL
  Filled 2022-06-25 (×2): qty 1

## 2022-06-25 MED ORDER — POVIDONE-IODINE 10 % EX OINT
TOPICAL_OINTMENT | CUTANEOUS | Status: AC
  Filled 2022-06-25: qty 1

## 2022-06-25 MED ORDER — METFORMIN HCL 500 MG PO TABS
500.0000 mg | ORAL_TABLET | Freq: Every day | ORAL | Status: DC
  Administered 2022-06-25: 500 mg via ORAL
  Filled 2022-06-25: qty 1

## 2022-06-25 MED ORDER — KETOROLAC TROMETHAMINE 15 MG/ML IJ SOLN: 15.0000 mg | Freq: Four times a day (QID) | INTRAMUSCULAR | Status: DC | PRN

## 2022-06-25 MED ORDER — SIMETHICONE 80 MG PO CHEW: 40.0000 mg | CHEWABLE_TABLET | Freq: Four times a day (QID) | ORAL | Status: DC | PRN

## 2022-06-25 MED ORDER — ONDANSETRON HCL 4 MG/2ML IJ SOLN: 4.0000 mg | Freq: Four times a day (QID) | INTRAMUSCULAR | Status: DC | PRN

## 2022-06-25 MED ORDER — ROCURONIUM BROMIDE 100 MG/10ML IV SOLN
INTRAVENOUS | Status: DC | PRN
  Administered 2022-06-25: 50 mg via INTRAVENOUS

## 2022-06-25 MED ORDER — LACTATED RINGERS IV SOLN: INTRAVENOUS | Status: DC

## 2022-06-25 MED ORDER — MEPERIDINE HCL 50 MG/ML IJ SOLN: 6.2500 mg | INTRAMUSCULAR | Status: DC | PRN

## 2022-06-25 MED ORDER — ORAL CARE MOUTH RINSE: 15.0000 mL | Freq: Once | OROMUCOSAL | Status: AC

## 2022-06-25 MED ORDER — ROCURONIUM BROMIDE 10 MG/ML (PF) SYRINGE
PREFILLED_SYRINGE | INTRAVENOUS | Status: AC
  Filled 2022-06-25: qty 10

## 2022-06-25 MED ORDER — ENOXAPARIN SODIUM 40 MG/0.4ML IJ SOSY
40.0000 mg | PREFILLED_SYRINGE | INTRAMUSCULAR | Status: DC
  Administered 2022-06-26: 40 mg via SUBCUTANEOUS
  Filled 2022-06-25: qty 0.4

## 2022-06-25 MED ORDER — PREGABALIN 75 MG PO CAPS
75.0000 mg | ORAL_CAPSULE | Freq: Every day | ORAL | Status: DC
  Administered 2022-06-25: 75 mg via ORAL
  Filled 2022-06-25: qty 1

## 2022-06-25 MED ORDER — ISOSORBIDE MONONITRATE ER 60 MG PO TB24
60.0000 mg | ORAL_TABLET | Freq: Every day | ORAL | Status: DC
  Administered 2022-06-26: 60 mg via ORAL
  Filled 2022-06-25: qty 1

## 2022-06-25 MED ORDER — SODIUM CHLORIDE 0.9 % IV SOLN: INTRAVENOUS | Status: DC

## 2022-06-25 MED ORDER — NITROGLYCERIN 0.4 MG SL SUBL: 0.4000 mg | SUBLINGUAL_TABLET | SUBLINGUAL | Status: DC | PRN

## 2022-06-25 MED ORDER — LIDOCAINE HCL (CARDIAC) PF 100 MG/5ML IV SOSY
PREFILLED_SYRINGE | INTRAVENOUS | Status: DC | PRN
  Administered 2022-06-25: 100 mg via INTRAVENOUS

## 2022-06-25 MED ORDER — GLIMEPIRIDE 2 MG PO TABS
4.0000 mg | ORAL_TABLET | Freq: Every day | ORAL | Status: DC
  Administered 2022-06-26: 4 mg via ORAL
  Filled 2022-06-25: qty 2

## 2022-06-25 MED ORDER — VITAMIN B-12 1000 MCG PO TABS
1000.0000 ug | ORAL_TABLET | Freq: Every day | ORAL | Status: DC
  Administered 2022-06-26: 1000 ug via ORAL
  Filled 2022-06-25: qty 1

## 2022-06-25 MED ORDER — PHENYLEPHRINE 80 MCG/ML (10ML) SYRINGE FOR IV PUSH (FOR BLOOD PRESSURE SUPPORT)
PREFILLED_SYRINGE | INTRAVENOUS | Status: AC
  Filled 2022-06-25: qty 10

## 2022-06-25 MED ORDER — METFORMIN HCL 500 MG PO TABS
1000.0000 mg | ORAL_TABLET | Freq: Every day | ORAL | Status: DC
  Administered 2022-06-26: 1000 mg via ORAL
  Filled 2022-06-25: qty 2

## 2022-06-25 MED ORDER — IPRATROPIUM-ALBUTEROL 0.5-2.5 (3) MG/3ML IN SOLN
3.0000 mL | Freq: Once | RESPIRATORY_TRACT | Status: AC
  Administered 2022-06-25: 3 mL via RESPIRATORY_TRACT

## 2022-06-25 MED ORDER — METOCLOPRAMIDE HCL 5 MG/ML IJ SOLN
INTRAMUSCULAR | Status: AC
  Filled 2022-06-25: qty 2

## 2022-06-25 MED ORDER — SUCCINYLCHOLINE CHLORIDE 200 MG/10ML IV SOSY
PREFILLED_SYRINGE | INTRAVENOUS | Status: AC
  Filled 2022-06-25: qty 10

## 2022-06-25 MED ORDER — FUROSEMIDE 40 MG PO TABS
60.0000 mg | ORAL_TABLET | Freq: Every day | ORAL | Status: DC
  Administered 2022-06-25 – 2022-06-26 (×2): 60 mg via ORAL
  Filled 2022-06-25 (×2): qty 1

## 2022-06-25 MED ORDER — CEFAZOLIN SODIUM-DEXTROSE 2-4 GM/100ML-% IV SOLN
2.0000 g | INTRAVENOUS | Status: AC
  Administered 2022-06-25: 2 g via INTRAVENOUS

## 2022-06-25 MED ORDER — HYDROXYZINE HCL 25 MG PO TABS: 25.0000 mg | ORAL_TABLET | Freq: Every day | ORAL | Status: DC

## 2022-06-25 MED ORDER — DEXAMETHASONE SODIUM PHOSPHATE 4 MG/ML IJ SOLN
INTRAMUSCULAR | Status: DC | PRN
  Administered 2022-06-25: 4 mg via INTRAVENOUS

## 2022-06-25 MED ORDER — KETOROLAC TROMETHAMINE 15 MG/ML IJ SOLN
15.0000 mg | Freq: Four times a day (QID) | INTRAMUSCULAR | Status: AC
  Administered 2022-06-25: 15 mg via INTRAVENOUS
  Filled 2022-06-25: qty 1

## 2022-06-25 MED ORDER — FENTANYL CITRATE PF 50 MCG/ML IJ SOSY: 50.0000 ug | PREFILLED_SYRINGE | INTRAMUSCULAR | Status: DC | PRN

## 2022-06-25 MED ORDER — FENTANYL CITRATE PF 50 MCG/ML IJ SOSY
PREFILLED_SYRINGE | INTRAMUSCULAR | Status: AC
  Filled 2022-06-25: qty 1

## 2022-06-25 MED ORDER — POVIDONE-IODINE 10 % OINT PACKET
TOPICAL_OINTMENT | CUTANEOUS | Status: DC | PRN
  Administered 2022-06-25: 1 via TOPICAL

## 2022-06-25 MED ORDER — HYDROMORPHONE HCL 1 MG/ML IJ SOLN
1.0000 mg | INTRAMUSCULAR | Status: DC | PRN
  Administered 2022-06-25 (×2): 1 mg via INTRAVENOUS
  Filled 2022-06-25 (×2): qty 1

## 2022-06-25 MED ORDER — ASPIRIN 81 MG PO TBEC
81.0000 mg | DELAYED_RELEASE_TABLET | Freq: Every day | ORAL | Status: DC
  Administered 2022-06-25 – 2022-06-26 (×2): 81 mg via ORAL
  Filled 2022-06-25 (×2): qty 1

## 2022-06-25 MED ORDER — CEFAZOLIN SODIUM-DEXTROSE 2-4 GM/100ML-% IV SOLN
INTRAVENOUS | Status: AC
  Filled 2022-06-25: qty 100

## 2022-06-25 MED ORDER — EPHEDRINE SULFATE-NACL 50-0.9 MG/10ML-% IV SOSY
PREFILLED_SYRINGE | INTRAVENOUS | Status: DC | PRN
  Administered 2022-06-25: 5 mg via INTRAVENOUS

## 2022-06-25 MED ORDER — PREGABALIN 75 MG PO CAPS
150.0000 mg | ORAL_CAPSULE | Freq: Every day | ORAL | Status: DC
  Administered 2022-06-26: 150 mg via ORAL
  Filled 2022-06-25: qty 2

## 2022-06-25 MED ORDER — BUPIVACAINE LIPOSOME 1.3 % IJ SUSP
INTRAMUSCULAR | Status: DC | PRN
  Administered 2022-06-25: 20 mL

## 2022-06-25 MED ORDER — BUPIVACAINE LIPOSOME 1.3 % IJ SUSP
INTRAMUSCULAR | Status: AC
  Filled 2022-06-25: qty 20

## 2022-06-25 MED ORDER — SUCCINYLCHOLINE CHLORIDE 200 MG/10ML IV SOSY
PREFILLED_SYRINGE | INTRAVENOUS | Status: DC | PRN
  Administered 2022-06-25: 100 mg via INTRAVENOUS

## 2022-06-25 MED ORDER — HYDROMORPHONE HCL 1 MG/ML IJ SOLN: 0.2500 mg | INTRAMUSCULAR | Status: DC | PRN

## 2022-06-25 MED ORDER — IPRATROPIUM-ALBUTEROL 0.5-2.5 (3) MG/3ML IN SOLN
RESPIRATORY_TRACT | Status: AC
  Filled 2022-06-25: qty 3

## 2022-06-25 MED ORDER — ONDANSETRON HCL 4 MG/2ML IJ SOLN
INTRAMUSCULAR | Status: AC
  Filled 2022-06-25: qty 2

## 2022-06-25 SURGICAL SUPPLY — 38 items
APL PRP STRL LF DISP 70% ISPRP (MISCELLANEOUS) ×1
BINDER BREAST XLRG (GAUZE/BANDAGES/DRESSINGS) IMPLANT
CHLORAPREP W/TINT 26 (MISCELLANEOUS) ×1 IMPLANT
CLOTH BEACON ORANGE TIMEOUT ST (SAFETY) ×1 IMPLANT
COVER LIGHT HANDLE STERIS (MISCELLANEOUS) ×2 IMPLANT
ELECT REM PT RETURN 9FT ADLT (ELECTROSURGICAL) ×1
ELECTRODE REM PT RTRN 9FT ADLT (ELECTROSURGICAL) ×1 IMPLANT
EVACUATOR DRAINAGE 10X20 100CC (DRAIN) ×1 IMPLANT
EVACUATOR SILICONE 100CC (DRAIN) ×1
GLOVE BIOGEL PI IND STRL 7.0 (GLOVE) ×2 IMPLANT
GLOVE SURG SS PI 6.5 STRL IVOR (GLOVE) IMPLANT
GLOVE SURG SS PI 7.0 STRL IVOR (GLOVE) IMPLANT
GLOVE SURG SS PI 7.5 STRL IVOR (GLOVE) ×2 IMPLANT
GOWN STRL REUS W/TWL LRG LVL3 (GOWN DISPOSABLE) ×3 IMPLANT
INST SET MINOR GENERAL (KITS) ×1 IMPLANT
KIT TURNOVER KIT A (KITS) ×1 IMPLANT
MANIFOLD NEPTUNE II (INSTRUMENTS) ×1 IMPLANT
NDL HYPO 21X1.5 SAFETY (NEEDLE) IMPLANT
NEEDLE HYPO 21X1.5 SAFETY (NEEDLE) ×1 IMPLANT
NS IRRIG 1000ML POUR BTL (IV SOLUTION) ×1 IMPLANT
PACK MINOR (CUSTOM PROCEDURE TRAY) ×1 IMPLANT
PAD ABD 5X9 TENDERSORB (GAUZE/BANDAGES/DRESSINGS) IMPLANT
PAD ARMBOARD 7.5X6 YLW CONV (MISCELLANEOUS) ×1 IMPLANT
PENCIL SMOKE EVACUATOR (MISCELLANEOUS) ×1 IMPLANT
SET BASIN LINEN APH (SET/KITS/TRAYS/PACK) ×1 IMPLANT
SPONGE DRAIN TRACH 4X4 STRL 2S (GAUZE/BANDAGES/DRESSINGS) ×1 IMPLANT
SPONGE T-LAP 18X18 ~~LOC~~+RFID (SPONGE) ×2 IMPLANT
STAPLER VISISTAT (STAPLE) ×1 IMPLANT
SUT ETHILON 3 0 FSL (SUTURE) IMPLANT
SUT SILK 2 0 (SUTURE) ×1
SUT SILK 2 0 SH (SUTURE) ×1 IMPLANT
SUT SILK 2-0 18XBRD TIE 12 (SUTURE) ×1 IMPLANT
SUT VIC AB 2-0 CT1 27 (SUTURE) ×6
SUT VIC AB 2-0 CT1 TAPERPNT 27 (SUTURE) ×4 IMPLANT
SUT VIC AB 3-0 SH 27 (SUTURE) ×1
SUT VIC AB 3-0 SH 27X BRD (SUTURE) ×1 IMPLANT
SYR 20ML LL LF (SYRINGE) ×1 IMPLANT
SYR BULB IRRIG 60ML STRL (SYRINGE) ×1 IMPLANT

## 2022-06-25 NOTE — Interval H&P Note (Signed)
History and Physical Interval Note:  06/25/2022 9:02 AM  Katelyn Kaufman  has presented today for surgery, with the diagnosis of MALIGNANT NEWPLASM LEFT BREAST.  The various methods of treatment have been discussed with the patient and family. After consideration of risks, benefits and other options for treatment, the patient has consented to  Procedure(s): SIMPLE MASTECTOMY (Left) as a surgical intervention.  The patient's history has been reviewed, patient examined, no change in status, stable for surgery.  I have reviewed the patient's chart and labs.  Questions were answered to the patient's satisfaction.     Aviva Signs

## 2022-06-25 NOTE — Op Note (Signed)
Patient:  Katelyn Kaufman  DOB:  05-16-1951  MRN:  [id number]   Preop Diagnosis: Left breast carcinoma, invasive ductal, ER positive  Postop Diagnosis: Same  Procedure: Left simple mastectomy  Surgeon: Aviva Signs, MD  Anes: General endotracheal  Indications: Patient is a 72 year old black female who presents for left simple mastectomy.  The risks and benefits of the procedure including bleeding, infection, cardiopulmonary difficulties, and the possibility of a blood transfusion were fully explained to the patient, who gave informed consent.  Procedure note: The patient was placed in the supine position.  After induction of general endotracheal anesthesia, the left breast was prepped and draped using usual sterile technique with ChloraPrep.  Surgical site confirmation was performed.  An elliptical incision was made medial to lateral around the left nipple.  A superior flap was formed to the clavicle and an inferior flap formed to the chest wall.  The breast along with the fascia was removed medial to lateral using Bovie electrocautery.  A suture was placed superiorly for orientation purposes.  The specimen was then removed from the operative field and sent to pathology.  A bleeding was controlled using Bovie electrocautery.  The wound was irrigated with normal saline.  A #10 flat Jackson-Pratt drain was placed under the flap and brought out through a separate stab wound inferior to the incision line.  It was secured at the skin level using a 3-0 nylon interrupted suture.  The subcutaneous layer was reapproximated using a 2-0 Vicryl interrupted suture.  The skin was closed using staples.  Exparel was instilled into the surrounding wound.  Betadine ointment and a dry sterile dressing were applied.  All tape and needle counts were correct at the end of the procedure.  The patient was extubated in the operating room and transferred to PACU in stable condition.  Complications: None  EBL: 50  cc  Specimen: Left breast  Drains: Jackson-Pratt drain to left mastectomy flap

## 2022-06-25 NOTE — Anesthesia Postprocedure Evaluation (Signed)
Anesthesia Post Note  Patient: Katelyn Kaufman  Procedure(s) Performed: SIMPLE MASTECTOMY (Left: Breast)  Patient location during evaluation: Phase II Anesthesia Type: General Level of consciousness: awake and alert and oriented Pain management: pain level controlled Vital Signs Assessment: post-procedure vital signs reviewed and stable Respiratory status: spontaneous breathing, nonlabored ventilation and respiratory function stable Cardiovascular status: blood pressure returned to baseline and stable Postop Assessment: no apparent nausea or vomiting Anesthetic complications: no  No notable events documented.   Last Vitals:  Vitals:   06/25/22 1145 06/25/22 1200  BP: (!) 172/82 (!) 183/81  Pulse: 72 68  Resp: 14 15  Temp:    SpO2: 100% 100%    Last Pain:  Vitals:   06/25/22 1200  TempSrc:   PainSc: Asleep                 Iyannah Blake C Rebecah Dangerfield

## 2022-06-25 NOTE — Anesthesia Procedure Notes (Signed)
Procedure Name: Intubation Date/Time: 06/25/2022 9:53 AM  Performed by: Lieutenant Diego, CRNAPre-anesthesia Checklist: Patient identified, Emergency Drugs available, Suction available and Patient being monitored Patient Re-evaluated:Patient Re-evaluated prior to induction Oxygen Delivery Method: Circle system utilized Preoxygenation: Pre-oxygenation with 100% oxygen Induction Type: IV induction and Rapid sequence Laryngoscope Size: Glidescope and 3 Grade View: Grade I Tube type: Oral Number of attempts: 1 Airway Equipment and Method: Stylet Placement Confirmation: ETT inserted through vocal cords under direct vision, positive ETCO2 and breath sounds checked- equal and bilateral Secured at: 23 cm Tube secured with: Tape Dental Injury: Teeth and Oropharynx as per pre-operative assessment

## 2022-06-25 NOTE — Anesthesia Preprocedure Evaluation (Addendum)
Anesthesia Evaluation  Patient identified by MRN, date of birth, ID band Patient awake    Reviewed: Allergy & Precautions, H&P , NPO status , Patient's Chart, lab work & pertinent test results, reviewed documented beta blocker date and time   History of Anesthesia Complications (+) PONV and history of anesthetic complications  Airway Mallampati: III  TM Distance: >3 FB Neck ROM: Full    Dental  (+) Dental Advisory Given, Missing, Chipped   Pulmonary sleep apnea (non cmplaint) , COPD,  COPD inhaler, Current Smoker and Patient abstained from smoking.   Pulmonary exam normal breath sounds clear to auscultation       Cardiovascular Exercise Tolerance: Poor hypertension, Pt. on medications and Pt. on home beta blockers + CAD, + Past MI and + CABG  Normal cardiovascular exam Rhythm:Regular Rate:Normal   1. Left ventricular ejection fraction, by estimation, is 60 to 65%. The  left ventricle has normal function. The left ventricle has no regional  wall motion abnormalities. There is mild left ventricular hypertrophy.  Left ventricular diastolic parameters  are consistent with Grade I diastolic dysfunction (impaired relaxation).   2. Right ventricular systolic function is normal. The right ventricular  size is normal. Tricuspid regurgitation signal is inadequate for assessing  PA pressure.   3. The mitral valve is grossly normal. Trivial mitral valve  regurgitation.   4. The aortic valve is tricuspid. Aortic valve regurgitation is not  visualized.   5. The inferior vena cava is normal in size with greater than 50%  respiratory variability, suggesting right atrial pressure of 3 mmHg.     Neuro/Psych  PSYCHIATRIC DISORDERS Anxiety      Neuromuscular disease    GI/Hepatic Neg liver ROS,GERD  Controlled,,  Endo/Other  diabetes, Well Controlled, Type 2, Insulin Dependent, Oral Hypoglycemic Agents    Renal/GU negative Renal ROS   negative genitourinary   Musculoskeletal  (+) Arthritis  (chronic back pain and sciatica, takes oxycodone), Osteoarthritis,    Abdominal   Peds negative pediatric ROS (+)  Hematology negative hematology ROS (+)   Anesthesia Other Findings Preoperative Cardiac evaluation CAD s/p NSTEMI and CABGx3 in 10/2021 Patient had a non-STEMI with emergency CABG x 3 on 11/16/2021.  Patient is overall doing well. She reports rare sharp chest pain with movement.  She also has palpable chest pain on exam, she describes it as numbness and tingling, suspect this may be nerve pain.  She is on Lyrica.  Patient has breast cancer and is to undergo left simple mastectomy.  Initial preop clearance was 10/30/2021.  Surgery was subsequently deferred due to non-STEMI/CABG.  She is now 6 months out.  Patient is taking aspirin and Plavix.  I will discuss with MD how long to hold Plavix prior to surgery.  Can continue aspirin perioperatively.  Patient uses a cane at baseline, not very active.  METs are greater than 4.  EKG today shows normal sinus rhythm with no ischemic changes.  According to the revised cardiac risk index patient is class IV risk, 15% 30-day risk of death, MI, or cardiac arrest.  No further cardiac workup required prior to surgery.  We will contact surgeons office (Dr. Arnoldo Morale) with further instructions from MD regarding antiplatelet therapy.   HTN BP high, but she has not had medications today. Continue Imdur '60mg'$  daily, Losartan '50mg'$  daily, Lopressor '25mg'$ BID.   HLD LDL 214 on 07/2020. Re-check fasting lipid panel today.   DM2 Followed by PCP.  Recent A1c was 7.1.   Disposition: Follow  up in 3 month(s) with MD/APP      Signed, Cadence Ninfa Meeker, PA-C  05/18/2022 3:11 PM    [facility]    Reproductive/Obstetrics negative OB ROS                             Anesthesia Physical Anesthesia Plan  ASA: 3  Anesthesia Plan: General   Post-op Pain  Management: Dilaudid IV   Induction: Intravenous and Rapid sequence  PONV Risk Score and Plan: 4 or greater and Ondansetron, Dexamethasone and Metaclopromide  Airway Management Planned: Oral ETT and Video Laryngoscope Planned  Additional Equipment:   Intra-op Plan:   Post-operative Plan: Extubation in OR  Informed Consent: I have reviewed the patients History and Physical, chart, labs and discussed the procedure including the risks, benefits and alternatives for the proposed anesthesia with the patient or authorized representative who has indicated his/her understanding and acceptance.       Plan Discussed with: CRNA and Surgeon  Anesthesia Plan Comments: (Will give metoprolol in OR)        Anesthesia Quick Evaluation

## 2022-06-25 NOTE — Transfer of Care (Signed)
Immediate Anesthesia Transfer of Care Note  Patient: Katelyn Kaufman  Procedure(s) Performed: SIMPLE MASTECTOMY (Left: Breast)  Patient Location: PACU  Anesthesia Type:General  Level of Consciousness: awake, alert , and oriented  Airway & Oxygen Therapy: Patient connected to face mask oxygen  Post-op Assessment: Report given to RN and Post -op Vital signs reviewed and stable  Post vital signs: Reviewed and stable  Last Vitals:  Vitals Value Taken Time  BP 133/90 06/25/22 1104  Temp 98.3   Pulse 94 06/25/22 1107  Resp 16 06/25/22 1107  SpO2 98 % 06/25/22 1107  Vitals shown include unvalidated device data.  Last Pain:  Vitals:   06/25/22 0915  TempSrc: Oral  PainSc: 8       Patients Stated Pain Goal: 6 (43/83/77 9396)  Complications: No notable events documented.

## 2022-06-26 DIAGNOSIS — D0592 Unspecified type of carcinoma in situ of left breast: Secondary | ICD-10-CM | POA: Diagnosis not present

## 2022-06-26 LAB — BASIC METABOLIC PANEL
Anion gap: 10 (ref 5–15)
BUN: 13 mg/dL (ref 8–23)
CO2: 22 mmol/L (ref 22–32)
Calcium: 10 mg/dL (ref 8.9–10.3)
Chloride: 103 mmol/L (ref 98–111)
Creatinine, Ser: 1.15 mg/dL — ABNORMAL HIGH (ref 0.44–1.00)
GFR, Estimated: 51 mL/min — ABNORMAL LOW (ref >60)
Glucose, Bld: 226 mg/dL — ABNORMAL HIGH (ref 70–99)
Potassium: 4.1 mmol/L (ref 3.5–5.1)
Sodium: 135 mmol/L (ref 135–145)

## 2022-06-26 LAB — CBC
HCT: 36.1 % (ref 36.0–46.0)
Hemoglobin: 12 g/dL (ref 12.0–15.0)
MCH: 31.6 pg (ref 26.0–34.0)
MCHC: 33.2 g/dL (ref 30.0–36.0)
MCV: 95 fL (ref 80.0–100.0)
Platelets: 191 10*3/uL (ref 150–400)
RBC: 3.8 MIL/uL — ABNORMAL LOW (ref 3.87–5.11)
RDW: 14.8 % (ref 11.5–15.5)
WBC: 12.7 10*3/uL — ABNORMAL HIGH (ref 4.0–10.5)
nRBC: 0 % (ref 0.0–0.2)

## 2022-06-26 LAB — GLUCOSE, CAPILLARY: Glucose-Capillary: 243 mg/dL — ABNORMAL HIGH (ref 70–99)

## 2022-06-26 MED ORDER — ORAL CARE MOUTH RINSE: 15.0000 mL | OROMUCOSAL | Status: DC | PRN

## 2022-06-26 NOTE — Discharge Summary (Signed)
Physician Discharge Summary  Patient ID: Katelyn Kaufman MRN: [id number] DOB/AGE: 1950-09-26 72 y.o.  Admit date: 06/25/2022 Discharge date: 06/26/2022  Admission Diagnoses: Left breast cancer  Discharge Diagnoses:  Principal Problem:   S/P left mastectomy Insulin-dependent diabetes mellitus, coronary artery disease, hypertension, arthritis, sleep apnea, morbid obesity  Discharged Condition: good  Hospital Course: Patient is a 72 year old black female who presented for treatment of her left breast cancer.  She underwent a left simple mastectomy on 06/25/2022.  Her postoperative course was unremarkable.  Her diet was advanced out difficulty.  Her hemoglobin was stable on postoperative day 1.  She is being discharged home on 06/26/2022 in good and improving condition.  Treatments: surgery: Left simple mastectomy on 06/25/2022  Discharge Exam: Blood pressure (!) 127/91, pulse 67, temperature 97.6 F (36.4 C), temperature source Oral, resp. rate 16, height '5\' 4"'$  (1.626 m), weight 107 kg, SpO2 98 %. General appearance: alert, cooperative, and no distress Resp: clear to auscultation bilaterally Breasts: Left mastectomy incision healing well.  No seroma or hematoma.  JP drainage sanguinous in nature. Cardio: regular rate and rhythm, S1, S2 normal, no murmur, click, rub or gallop  Disposition: Discharge disposition: 01-Home or Self Care        Allergies as of 06/26/2022       Reactions   Latex Rash        Medication List     TAKE these medications    anastrozole 1 MG tablet Commonly known as: ARIMIDEX Take 1 tablet (1 mg total) by mouth daily.   aspirin EC 81 MG tablet Take 1 tablet (81 mg total) by mouth daily. Swallow whole.   cholecalciferol 25 MCG (1000 UNIT) tablet Commonly known as: VITAMIN D3 Take 1,000 Units by mouth daily.   clopidogrel 75 MG tablet Commonly known as: PLAVIX TAKE 1 TABLET BY MOUTH EVERY DAY   cyanocobalamin 1000 MCG tablet Commonly known as:  VITAMIN B12 Take 1,000 mcg by mouth daily.   cyclobenzaprine 10 MG tablet Commonly known as: FLEXERIL Take 10 mg by mouth 2 (two) times daily as needed for muscle spasms.   DULoxetine 60 MG capsule Commonly known as: CYMBALTA Take 60 mg by mouth daily.   ezetimibe 10 MG tablet Commonly known as: ZETIA Take 10 mg by mouth daily.   furosemide 40 MG tablet Commonly known as: LASIX Take 60 mg by mouth daily.   glimepiride 4 MG tablet Commonly known as: AMARYL Take 4 mg by mouth daily with breakfast.   hydrOXYzine 25 MG tablet Commonly known as: ATARAX Take 25 mg by mouth daily.   insulin glargine 100 UNIT/ML injection Commonly known as: LANTUS Inject 40 Units into the skin at bedtime.   isosorbide mononitrate 60 MG 24 hr tablet Commonly known as: IMDUR Take 60 mg by mouth daily.   losartan 50 MG tablet Commonly known as: COZAAR Take 0.5 tablets (25 mg total) by mouth daily.   metFORMIN 500 MG tablet Commonly known as: GLUCOPHAGE Take 500-1,000 mg by mouth See admin instructions. Take 2 tablets (1000 mg) in the morning and Take 1 tablets (500 mg) in the evening   metoprolol tartrate 25 MG tablet Commonly known as: LOPRESSOR Take 1 tablet (25 mg total) by mouth 2 (two) times daily.   mometasone 0.1 % ointment Commonly known as: ELOCON Apply 1 Application topically daily.   nitroGLYCERIN 0.4 MG SL tablet Commonly known as: NITROSTAT Place 1 tablet (0.4 mg total) under the tongue every 5 (five) minutes as needed for  chest pain.   ondansetron 4 MG tablet Commonly known as: ZOFRAN Take 4 mg by mouth every 8 (eight) hours as needed for nausea or vomiting.   oxyCODONE-acetaminophen 10-325 MG tablet Commonly known as: PERCOCET Take 1 tablet by mouth every 4 (four) hours as needed for pain.   pregabalin 75 MG capsule Commonly known as: LYRICA Take 75-150 mg by mouth See admin instructions. Take 2 tablets (150 mg) in the morning & Take 1 tablet (75 mg) at bedtime    rosuvastatin 10 MG tablet Commonly known as: CRESTOR Take 10 mg by mouth daily.   triamcinolone ointment 0.1 % Commonly known as: KENALOG Apply 1 Application topically daily as needed (For rash).   vitamin C 1000 MG tablet Take 1,000 mg by mouth daily.        Follow-up Information     Aviva Signs, MD. Schedule an appointment as soon as possible for a visit on 07/06/2022.   Specialty: General Surgery Contact information: [address] [place] [address] [phone]                 Signed: Aviva Signs 06/26/2022, 9:49 AM

## 2022-06-26 NOTE — Progress Notes (Signed)
Pt having bright red bloody drainage in JP drain. 15m output overnight. PRN pain meds given with some relief. Pt c/o itching all over. Bath completed, lotion applied to back, and benadryl administered. Pt ambulatory to restroom with 1 assist and walker. BBryson CoronaSEdd Fabian

## 2022-06-29 LAB — SURGICAL PATHOLOGY

## 2022-07-01 ENCOUNTER — Encounter (HOSPITAL_COMMUNITY): Payer: Self-pay | Admitting: General Surgery

## 2022-07-06 ENCOUNTER — Ambulatory Visit (INDEPENDENT_AMBULATORY_CARE_PROVIDER_SITE_OTHER): Payer: Medicare HMO | Admitting: General Surgery

## 2022-07-06 ENCOUNTER — Encounter: Payer: Self-pay | Admitting: General Surgery

## 2022-07-06 VITALS — BP 163/85 | HR 87 | Temp 98.5°F | Resp 16 | Ht 64.0 in | Wt 234.0 lb

## 2022-07-06 DIAGNOSIS — Z09 Encounter for follow-up examination after completed treatment for conditions other than malignant neoplasm: Secondary | ICD-10-CM

## 2022-07-06 NOTE — Progress Notes (Signed)
Subjective:     Katelyn Kaufman  Patient here for postoperative visit, status post left mastectomy.  She is doing well.  She has had a lot of leakage at the JP site.  She is otherwise doing okay.  She denies any fever or chills. Objective:    BP (!) 163/85   Pulse 87   Temp 98.5 F (36.9 C) (Oral)   Resp 16   Ht 5' 4"$  (1.626 m)   Wt 234 lb (106.1 kg)   SpO2 92%   BMI 40.17 kg/m   General:  alert, cooperative, and no distress  Left breast is healing well.  Incision intact.  Some swelling present.  JP drain has dislodged and thus was removed.  Some of the staples were removed. 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  Margins are negative.     Assessment:    Doing well postoperatively.    Plan:   Follow-up here in 1 week for wound check.  Will refer to oncology at that time.

## 2022-07-08 ENCOUNTER — Other Ambulatory Visit: Payer: Self-pay | Admitting: *Deleted

## 2022-07-08 MED ORDER — CLOPIDOGREL BISULFATE 75 MG PO TABS: 75.0000 mg | ORAL_TABLET | Freq: Every day | ORAL | 1 refills | Status: DC

## 2022-07-08 MED ORDER — NITROGLYCERIN 0.4 MG SL SUBL: 0.4000 mg | SUBLINGUAL_TABLET | SUBLINGUAL | 1 refills | Status: DC | PRN

## 2022-07-13 ENCOUNTER — Encounter: Payer: Self-pay | Admitting: General Surgery

## 2022-07-13 ENCOUNTER — Ambulatory Visit (INDEPENDENT_AMBULATORY_CARE_PROVIDER_SITE_OTHER): Payer: Medicare HMO | Admitting: General Surgery

## 2022-07-13 VITALS — BP 136/78 | HR 98 | Temp 98.0°F | Resp 18 | Ht 64.0 in | Wt 234.0 lb

## 2022-07-13 DIAGNOSIS — Z09 Encounter for follow-up examination after completed treatment for conditions other than malignant neoplasm: Secondary | ICD-10-CM

## 2022-07-13 NOTE — Progress Notes (Signed)
Subjective:     Katelyn Kaufman  Patient here for follow-up wound check.  She has no complaints. Objective:    BP 136/78   Pulse 98   Temp 98 F (36.7 C) (Oral)   Resp 18   Ht 5' 4"$  (1.626 m)   Wt 234 lb (106.1 kg)   SpO2 94%   BMI 40.17 kg/m   General:  alert, cooperative, and no distress  Left mastectomy incision line healing well.  No seroma or hematoma present.  Remaining staples removed.     Assessment:    Doing well postoperatively.    Plan:   Will refer to Dr. Delton Coombes of oncology for follow-up evaluation and treatment.  Follow-up here as needed.

## 2022-07-18 ENCOUNTER — Other Ambulatory Visit: Payer: Self-pay | Admitting: Physician Assistant

## 2022-07-18 ENCOUNTER — Other Ambulatory Visit: Payer: Self-pay | Admitting: Cardiology

## 2022-07-22 ENCOUNTER — Telehealth: Payer: Self-pay

## 2022-07-22 ENCOUNTER — Telehealth: Payer: Self-pay | Admitting: *Deleted

## 2022-07-22 NOTE — Telephone Encounter (Signed)
Received referral from Dr. Luciano Cutter at [place] for sleep apnea assessment. Placed in sleep mailbox

## 2022-07-22 NOTE — Telephone Encounter (Signed)
Received call from nurse from [facility] advising that patient had been having chills.  Spoke to patient and she admits to chills and generalized malaise.  Does not know if she has had a fever.  Has a call in to PCP for appointment.  Will follow up with our office to advise if PCP feels she needs follow up with [place].

## 2022-07-25 ENCOUNTER — Inpatient Hospital Stay (HOSPITAL_COMMUNITY)
Admission: EM | Admit: 2022-07-25 | Discharge: 2022-07-31 | DRG: 857 | Disposition: A | Discharge status: Skilled Nursing Facility | Payer: Medicare HMO | Attending: Family Medicine | Admitting: Family Medicine

## 2022-07-25 ENCOUNTER — Other Ambulatory Visit: Payer: Self-pay

## 2022-07-25 ENCOUNTER — Emergency Department (HOSPITAL_COMMUNITY): Payer: Medicare HMO

## 2022-07-25 ENCOUNTER — Encounter (HOSPITAL_COMMUNITY): Payer: Self-pay

## 2022-07-25 DIAGNOSIS — B9562 Methicillin resistant Staphylococcus aureus infection as the cause of diseases classified elsewhere: Secondary | ICD-10-CM | POA: Diagnosis present

## 2022-07-25 DIAGNOSIS — F419 Anxiety disorder, unspecified: Secondary | ICD-10-CM | POA: Diagnosis present

## 2022-07-25 DIAGNOSIS — E1169 Type 2 diabetes mellitus with other specified complication: Secondary | ICD-10-CM | POA: Diagnosis present

## 2022-07-25 DIAGNOSIS — B965 Pseudomonas (aeruginosa) (mallei) (pseudomallei) as the cause of diseases classified elsewhere: Secondary | ICD-10-CM | POA: Diagnosis present

## 2022-07-25 DIAGNOSIS — Z6841 Body Mass Index (BMI) 40.0 and over, adult: Secondary | ICD-10-CM

## 2022-07-25 DIAGNOSIS — Z79899 Other long term (current) drug therapy: Secondary | ICD-10-CM

## 2022-07-25 DIAGNOSIS — I10 Essential (primary) hypertension: Secondary | ICD-10-CM | POA: Diagnosis present

## 2022-07-25 DIAGNOSIS — F32A Depression, unspecified: Secondary | ICD-10-CM | POA: Diagnosis present

## 2022-07-25 DIAGNOSIS — T8141XA Infection following a procedure, superficial incisional surgical site, initial encounter: Secondary | ICD-10-CM | POA: Diagnosis not present

## 2022-07-25 DIAGNOSIS — Z825 Family history of asthma and other chronic lower respiratory diseases: Secondary | ICD-10-CM

## 2022-07-25 DIAGNOSIS — T8149XA Infection following a procedure, other surgical site, initial encounter: Principal | ICD-10-CM | POA: Diagnosis present

## 2022-07-25 DIAGNOSIS — E782 Mixed hyperlipidemia: Secondary | ICD-10-CM

## 2022-07-25 DIAGNOSIS — B964 Proteus (mirabilis) (morganii) as the cause of diseases classified elsewhere: Secondary | ICD-10-CM | POA: Diagnosis present

## 2022-07-25 DIAGNOSIS — Z8249 Family history of ischemic heart disease and other diseases of the circulatory system: Secondary | ICD-10-CM

## 2022-07-25 DIAGNOSIS — I96 Gangrene, not elsewhere classified: Secondary | ICD-10-CM | POA: Diagnosis present

## 2022-07-25 DIAGNOSIS — E119 Type 2 diabetes mellitus without complications: Secondary | ICD-10-CM | POA: Diagnosis present

## 2022-07-25 DIAGNOSIS — Z91040 Latex allergy status: Secondary | ICD-10-CM

## 2022-07-25 DIAGNOSIS — E785 Hyperlipidemia, unspecified: Secondary | ICD-10-CM | POA: Diagnosis present

## 2022-07-25 DIAGNOSIS — Z7984 Long term (current) use of oral hypoglycemic drugs: Secondary | ICD-10-CM

## 2022-07-25 DIAGNOSIS — Y838 Other surgical procedures as the cause of abnormal reaction of the patient, or of later complication, without mention of misadventure at the time of the procedure: Secondary | ICD-10-CM | POA: Diagnosis present

## 2022-07-25 DIAGNOSIS — I252 Old myocardial infarction: Secondary | ICD-10-CM

## 2022-07-25 DIAGNOSIS — I2510 Atherosclerotic heart disease of native coronary artery without angina pectoris: Secondary | ICD-10-CM | POA: Diagnosis present

## 2022-07-25 DIAGNOSIS — L7634 Postprocedural seroma of skin and subcutaneous tissue following other procedure: Secondary | ICD-10-CM | POA: Diagnosis present

## 2022-07-25 DIAGNOSIS — Z79811 Long term (current) use of aromatase inhibitors: Secondary | ICD-10-CM

## 2022-07-25 DIAGNOSIS — J449 Chronic obstructive pulmonary disease, unspecified: Secondary | ICD-10-CM | POA: Diagnosis present

## 2022-07-25 DIAGNOSIS — C50912 Malignant neoplasm of unspecified site of left female breast: Secondary | ICD-10-CM | POA: Diagnosis present

## 2022-07-25 DIAGNOSIS — E010 Iodine-deficiency related diffuse (endemic) goiter: Secondary | ICD-10-CM | POA: Insufficient documentation

## 2022-07-25 DIAGNOSIS — I25119 Atherosclerotic heart disease of native coronary artery with unspecified angina pectoris: Secondary | ICD-10-CM | POA: Diagnosis present

## 2022-07-25 DIAGNOSIS — F17200 Nicotine dependence, unspecified, uncomplicated: Secondary | ICD-10-CM | POA: Insufficient documentation

## 2022-07-25 DIAGNOSIS — R112 Nausea with vomiting, unspecified: Secondary | ICD-10-CM | POA: Diagnosis present

## 2022-07-25 DIAGNOSIS — Z951 Presence of aortocoronary bypass graft: Secondary | ICD-10-CM

## 2022-07-25 DIAGNOSIS — Z9012 Acquired absence of left breast and nipple: Secondary | ICD-10-CM

## 2022-07-25 DIAGNOSIS — Z1623 Resistance to quinolones and fluoroquinolones: Secondary | ICD-10-CM | POA: Diagnosis present

## 2022-07-25 DIAGNOSIS — Z794 Long term (current) use of insulin: Secondary | ICD-10-CM

## 2022-07-25 DIAGNOSIS — Z7902 Long term (current) use of antithrombotics/antiplatelets: Secondary | ICD-10-CM

## 2022-07-25 DIAGNOSIS — G4733 Obstructive sleep apnea (adult) (pediatric): Secondary | ICD-10-CM | POA: Diagnosis present

## 2022-07-25 DIAGNOSIS — E118 Type 2 diabetes mellitus with unspecified complications: Secondary | ICD-10-CM

## 2022-07-25 DIAGNOSIS — T8189XA Other complications of procedures, not elsewhere classified, initial encounter: Secondary | ICD-10-CM | POA: Diagnosis not present

## 2022-07-25 DIAGNOSIS — T8131XA Disruption of external operation (surgical) wound, not elsewhere classified, initial encounter: Secondary | ICD-10-CM | POA: Diagnosis present

## 2022-07-25 DIAGNOSIS — Z96651 Presence of right artificial knee joint: Secondary | ICD-10-CM | POA: Diagnosis present

## 2022-07-25 DIAGNOSIS — L7632 Postprocedural hematoma of skin and subcutaneous tissue following other procedure: Secondary | ICD-10-CM | POA: Diagnosis present

## 2022-07-25 DIAGNOSIS — Z7982 Long term (current) use of aspirin: Secondary | ICD-10-CM

## 2022-07-25 DIAGNOSIS — N6489 Other specified disorders of breast: Secondary | ICD-10-CM

## 2022-07-25 DIAGNOSIS — F17210 Nicotine dependence, cigarettes, uncomplicated: Secondary | ICD-10-CM | POA: Diagnosis present

## 2022-07-25 DIAGNOSIS — R1033 Periumbilical pain: Secondary | ICD-10-CM | POA: Diagnosis present

## 2022-07-25 LAB — CBC WITH DIFFERENTIAL/PLATELET
Abs Immature Granulocytes: 0.02 10*3/uL (ref 0.00–0.07)
Basophils Absolute: 0 10*3/uL (ref 0.0–0.1)
Basophils Relative: 1 %
Eosinophils Absolute: 0 10*3/uL (ref 0.0–0.5)
Eosinophils Relative: 0 %
HCT: 35.9 % — ABNORMAL LOW (ref 36.0–46.0)
Hemoglobin: 12.3 g/dL (ref 12.0–15.0)
Immature Granulocytes: 0 %
Lymphocytes Relative: 38 %
Lymphs Abs: 2.5 10*3/uL (ref 0.7–4.0)
MCH: 30.9 pg (ref 26.0–34.0)
MCHC: 34.3 g/dL (ref 30.0–36.0)
MCV: 90.2 fL (ref 80.0–100.0)
Monocytes Absolute: 1.7 10*3/uL — ABNORMAL HIGH (ref 0.1–1.0)
Monocytes Relative: 26 %
Neutro Abs: 2.3 10*3/uL (ref 1.7–7.7)
Neutrophils Relative %: 35 %
Platelets: 208 10*3/uL (ref 150–400)
RBC: 3.98 MIL/uL (ref 3.87–5.11)
RDW: 13.3 % (ref 11.5–15.5)
WBC: 6.6 10*3/uL (ref 4.0–10.5)
nRBC: 0 % (ref 0.0–0.2)

## 2022-07-25 LAB — COMPREHENSIVE METABOLIC PANEL
ALT: 14 U/L (ref 0–44)
AST: 18 U/L (ref 15–41)
Albumin: 3 g/dL — ABNORMAL LOW (ref 3.5–5.0)
Alkaline Phosphatase: 51 U/L (ref 38–126)
Anion gap: 8 (ref 5–15)
BUN: 13 mg/dL (ref 8–23)
CO2: 25 mmol/L (ref 22–32)
Calcium: 10.2 mg/dL (ref 8.9–10.3)
Chloride: 97 mmol/L — ABNORMAL LOW (ref 98–111)
Creatinine, Ser: 1.11 mg/dL — ABNORMAL HIGH (ref 0.44–1.00)
GFR, Estimated: 53 mL/min — ABNORMAL LOW (ref >60)
Glucose, Bld: 171 mg/dL — ABNORMAL HIGH (ref 70–99)
Potassium: 4.1 mmol/L (ref 3.5–5.1)
Sodium: 130 mmol/L — ABNORMAL LOW (ref 135–145)
Total Bilirubin: 0.7 mg/dL (ref 0.3–1.2)
Total Protein: 7 g/dL (ref 6.5–8.1)

## 2022-07-25 LAB — URINALYSIS, W/ REFLEX TO CULTURE (INFECTION SUSPECTED)
Bacteria, UA: NONE SEEN
Bilirubin Urine: NEGATIVE
Glucose, UA: NEGATIVE mg/dL
Hgb urine dipstick: NEGATIVE
Ketones, ur: NEGATIVE mg/dL
Nitrite: NEGATIVE
Protein, ur: NEGATIVE mg/dL
Specific Gravity, Urine: 1.018 (ref 1.005–1.030)
pH: 6 (ref 5.0–8.0)

## 2022-07-25 LAB — LACTIC ACID, PLASMA
Lactic Acid, Venous: 1.4 mmol/L (ref 0.5–1.9)
Lactic Acid, Venous: 1.2 mmol/L (ref 0.5–1.9)

## 2022-07-25 LAB — TROPONIN I (HIGH SENSITIVITY): Troponin I (High Sensitivity): 43 ng/L — ABNORMAL HIGH (ref <18)

## 2022-07-25 LAB — PROTIME-INR
INR: 1.1 (ref 0.8–1.2)
Prothrombin Time: 14.1 seconds (ref 11.4–15.2)

## 2022-07-25 LAB — GLUCOSE, CAPILLARY: Glucose-Capillary: 165 mg/dL — ABNORMAL HIGH (ref 70–99)

## 2022-07-25 LAB — APTT: aPTT: 35 seconds (ref 24–36)

## 2022-07-25 MED ORDER — ONDANSETRON HCL 4 MG/2ML IJ SOLN: 4.0000 mg | Freq: Four times a day (QID) | INTRAMUSCULAR | Status: DC | PRN

## 2022-07-25 MED ORDER — ACETAMINOPHEN 325 MG PO TABS
650.0000 mg | ORAL_TABLET | Freq: Four times a day (QID) | ORAL | Status: DC | PRN
  Administered 2022-07-28 – 2022-07-30 (×3): 650 mg via ORAL
  Filled 2022-07-25 (×3): qty 2

## 2022-07-25 MED ORDER — MORPHINE SULFATE (PF) 2 MG/ML IV SOLN
2.0000 mg | INTRAVENOUS | Status: DC | PRN
  Administered 2022-07-28 (×2): 2 mg via INTRAVENOUS
  Filled 2022-07-25 (×2): qty 1

## 2022-07-25 MED ORDER — PREGABALIN 75 MG PO CAPS: 75.0000 mg | ORAL_CAPSULE | ORAL | Status: DC

## 2022-07-25 MED ORDER — INSULIN GLARGINE-YFGN 100 UNIT/ML ~~LOC~~ SOLN
25.0000 [IU] | Freq: Every day | SUBCUTANEOUS | Status: DC
  Administered 2022-07-25 – 2022-07-30 (×6): 25 [IU] via SUBCUTANEOUS
  Filled 2022-07-25 (×7): qty 0.25

## 2022-07-25 MED ORDER — EZETIMIBE 10 MG PO TABS
10.0000 mg | ORAL_TABLET | Freq: Every day | ORAL | Status: DC
  Administered 2022-07-26 – 2022-07-31 (×6): 10 mg via ORAL
  Filled 2022-07-25 (×6): qty 1

## 2022-07-25 MED ORDER — DULOXETINE HCL 60 MG PO CPEP
60.0000 mg | ORAL_CAPSULE | Freq: Every day | ORAL | Status: DC
  Administered 2022-07-26 – 2022-07-31 (×6): 60 mg via ORAL
  Filled 2022-07-25 (×6): qty 1

## 2022-07-25 MED ORDER — NICOTINE 14 MG/24HR TD PT24
14.0000 mg | MEDICATED_PATCH | Freq: Every day | TRANSDERMAL | Status: DC
  Administered 2022-07-26 – 2022-07-31 (×6): 14 mg via TRANSDERMAL
  Filled 2022-07-25 (×6): qty 1

## 2022-07-25 MED ORDER — ACETAMINOPHEN 325 MG PO TABS
650.0000 mg | ORAL_TABLET | Freq: Once | ORAL | Status: AC
  Administered 2022-07-25: 650 mg via ORAL
  Filled 2022-07-25: qty 2

## 2022-07-25 MED ORDER — VANCOMYCIN HCL 750 MG/150ML IV SOLN
750.0000 mg | Freq: Two times a day (BID) | INTRAVENOUS | Status: DC
  Filled 2022-07-25 (×2): qty 150

## 2022-07-25 MED ORDER — ONDANSETRON HCL 4 MG PO TABS: 4.0000 mg | ORAL_TABLET | Freq: Four times a day (QID) | ORAL | Status: DC | PRN

## 2022-07-25 MED ORDER — INSULIN ASPART 100 UNIT/ML IJ SOLN
0.0000 [IU] | Freq: Three times a day (TID) | INTRAMUSCULAR | Status: DC
  Administered 2022-07-26: 2 [IU] via SUBCUTANEOUS
  Administered 2022-07-26: 5 [IU] via SUBCUTANEOUS
  Administered 2022-07-27: 2 [IU] via SUBCUTANEOUS
  Administered 2022-07-27 – 2022-07-28 (×4): 3 [IU] via SUBCUTANEOUS
  Administered 2022-07-29: 2 [IU] via SUBCUTANEOUS
  Administered 2022-07-29: 3 [IU] via SUBCUTANEOUS
  Administered 2022-07-29: 2 [IU] via SUBCUTANEOUS
  Administered 2022-07-30 – 2022-07-31 (×2): 3 [IU] via SUBCUTANEOUS

## 2022-07-25 MED ORDER — HEPARIN SODIUM (PORCINE) 5000 UNIT/ML IJ SOLN
5000.0000 [IU] | Freq: Three times a day (TID) | INTRAMUSCULAR | Status: DC
  Administered 2022-07-25 – 2022-07-31 (×17): 5000 [IU] via SUBCUTANEOUS
  Filled 2022-07-25 (×16): qty 1

## 2022-07-25 MED ORDER — VANCOMYCIN HCL 750 MG/150ML IV SOLN
750.0000 mg | Freq: Two times a day (BID) | INTRAVENOUS | Status: DC
  Filled 2022-07-25: qty 150

## 2022-07-25 MED ORDER — ACETAMINOPHEN 650 MG RE SUPP: 650.0000 mg | Freq: Four times a day (QID) | RECTAL | Status: DC | PRN

## 2022-07-25 MED ORDER — VANCOMYCIN HCL IN DEXTROSE 1-5 GM/200ML-% IV SOLN
1000.0000 mg | Freq: Once | INTRAVENOUS | Status: AC
  Administered 2022-07-25: 1000 mg via INTRAVENOUS
  Filled 2022-07-25: qty 200

## 2022-07-25 MED ORDER — INSULIN ASPART 100 UNIT/ML IJ SOLN
0.0000 [IU] | Freq: Every day | INTRAMUSCULAR | Status: DC
  Administered 2022-07-26 – 2022-07-28 (×2): 2 [IU] via SUBCUTANEOUS

## 2022-07-25 MED ORDER — HYDROXYZINE HCL 25 MG PO TABS
25.0000 mg | ORAL_TABLET | Freq: Every day | ORAL | Status: DC
  Administered 2022-07-25 – 2022-07-31 (×7): 25 mg via ORAL
  Filled 2022-07-25 (×7): qty 1

## 2022-07-25 MED ORDER — OXYCODONE HCL 5 MG PO TABS
5.0000 mg | ORAL_TABLET | ORAL | Status: DC | PRN
  Administered 2022-07-25 – 2022-07-31 (×10): 5 mg via ORAL
  Filled 2022-07-25 (×10): qty 1

## 2022-07-25 MED ORDER — METOPROLOL TARTRATE 25 MG PO TABS
25.0000 mg | ORAL_TABLET | Freq: Two times a day (BID) | ORAL | Status: DC
  Administered 2022-07-25 – 2022-07-31 (×12): 25 mg via ORAL
  Filled 2022-07-25 (×12): qty 1

## 2022-07-25 MED ORDER — LACTATED RINGERS IV BOLUS (SEPSIS)
1000.0000 mL | Freq: Once | INTRAVENOUS | Status: AC
  Administered 2022-07-25: 1000 mL via INTRAVENOUS

## 2022-07-25 MED ORDER — LOSARTAN POTASSIUM 50 MG PO TABS
25.0000 mg | ORAL_TABLET | Freq: Every day | ORAL | Status: DC
  Administered 2022-07-26: 25 mg via ORAL
  Filled 2022-07-25 (×2): qty 1

## 2022-07-25 MED ORDER — ROSUVASTATIN CALCIUM 20 MG PO TABS
10.0000 mg | ORAL_TABLET | Freq: Every day | ORAL | Status: DC
  Administered 2022-07-26 – 2022-07-31 (×6): 10 mg via ORAL
  Filled 2022-07-25 (×6): qty 1

## 2022-07-25 MED ORDER — IOHEXOL 300 MG/ML  SOLN
75.0000 mL | Freq: Once | INTRAMUSCULAR | Status: AC | PRN
  Administered 2022-07-25: 75 mL via INTRAVENOUS

## 2022-07-25 MED ORDER — ASPIRIN 81 MG PO TBEC
81.0000 mg | DELAYED_RELEASE_TABLET | Freq: Every day | ORAL | Status: DC
  Administered 2022-07-26 – 2022-07-31 (×6): 81 mg via ORAL
  Filled 2022-07-25 (×6): qty 1

## 2022-07-25 MED ORDER — ISOSORBIDE MONONITRATE ER 60 MG PO TB24
60.0000 mg | ORAL_TABLET | Freq: Every day | ORAL | Status: DC
  Administered 2022-07-26: 60 mg via ORAL
  Filled 2022-07-25 (×2): qty 1

## 2022-07-25 NOTE — Assessment & Plan Note (Signed)
-   Continue aspirin, metoprolol and adjusted dose of Imdur  -Will also continue the use of Crestor - Currently not demonstrating any complaints that would suggest ACS.

## 2022-07-25 NOTE — Progress Notes (Signed)
   07/25/22 2311  Provider Notification  Provider Name/Title Clearence Ped, MD  Date Provider Notified 07/25/22  Time Provider Notified 2311  Method of Notification Page  Notification Reason New onset of dysrhythmia  Type of New Onset of Dysrhythmia Supraventricular tachycardia  Symptoms of New Onset of Dysrhythmia Asymptomatic  Provider response No new orders  Date of Provider Response 07/25/22  Time of Provider Response 2312

## 2022-07-25 NOTE — Assessment & Plan Note (Signed)
-  Continue to follow general surgery recommendations and postoperative management.

## 2022-07-25 NOTE — ED Notes (Signed)
Pt up to bedside commode, tolerated well. Pt's gown saturated with drainage. ABD dressing applied.

## 2022-07-25 NOTE — H&P (Signed)
History and Physical    Patient: Katelyn Kaufman [id number] DOB: 1950/10/07 DOA: 07/25/2022 DOS: the patient was seen and examined on 07/25/2022 PCP: Luciano Cutter, DO  Patient coming from: Home  Chief Complaint:  Chief Complaint  Patient presents with   Post-op Problem   HPI: Katelyn Kaufman is a 72 y.o. female with medical history significant of anxiety, coronary artery disease, COPD, depression, hypertension, hyperlipidemia, OSA, type 2 diabetes mellitus, presents the ED with a chief complaint of left breast pain.  Patient reports that she had a mastectomy on June 25, 2022 due to breast cancer.  She has had pain since the surgery but it got acutely worse last night.  She reports she has had golden colored drainage and large amounts since the staples were taken out.  She developed a fever 2 days ago.  She reports a measured temperature of 99.9 but feeling feverish with chills.  She has had some nausea and vomiting for the last 4-5 days.  Any solid food she eats comes right back up.  She had no appetite.  She denies any hematemesis.  She does have some periumbilical abdominal pain that is crampy.  It is worse when she is heaving.  Patient reports that she has infrequent bowel movements at baseline but her last bowel movement was 2 days ago.  Patient has noticed no erythema at the surgical site.  She reports she has not been on any antibiotics recently.  On review of systems she reports some shortness of breath that is chronic.  She is not on any oxygen supplementation at home.  She has had some palpitations and chest pain yesterday.  She took a nitro and they were resolved.  She reports that the palpitations and chest pain were associated with nausea, diaphoresis, dizziness.  The dizziness has been 1 week intermittent.  She has no other complaints at this time.  Patient does smoke.  She request nicotine patch.  Patient does not drink alcohol and does not use illicit drugs.  She is vaccinated  for COVID.  Patient is full code. Review of Systems: As mentioned in the history of present illness. All other systems reviewed and are negative. 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   COPD (chronic obstructive pulmonary disease) ([place])    Depression    Essential hypertension    Hyperlipidemia    Myocardial infarction ([place])    [place] interventions to the LAD   OSA (obstructive sleep apnea)    CPAP qHS   PONV (postoperative nausea and vomiting)    Type 2 diabetes mellitus ([place])    Past Surgical History:  Procedure Laterality Date   BACK SURGERY     BREAST SURGERY     CATARACT EXTRACTION W/PHACO Right 10/24/2020   Procedure: CATARACT EXTRACTION PHACO AND INTRAOCULAR LENS PLACEMENT (IOC);  Surgeon: Baruch Goldmann, MD;  Location: [facility];  Service: Ophthalmology;  Laterality: Right;  CDE: 10.87   CATARACT EXTRACTION W/PHACO Left 12/01/2020   Procedure: CATARACT EXTRACTION PHACO AND INTRAOCULAR LENS PLACEMENT LEFT EYE;  Surgeon: Baruch Goldmann, MD;  Location: [facility];  Service: Ophthalmology;  Laterality: Left;  CDE  8.62   CORONARY ARTERY BYPASS GRAFT N/A 11/16/2021   Procedure: CORONARY ARTERY BYPASS GRAFTING (CABG) times  three using the left internal mammary and right saphenous vein.;  Surgeon: Gaye Pollack, MD;  Location: [facility];  Service: Open Heart Surgery;  Laterality: N/A;   IABP INSERTION N/A 11/16/2021   Procedure: IABP Insertion;  Surgeon: Leonie Man, MD;  Location: [facility];  Service: Cardiovascular;  Laterality: N/A;   LEFT HEART CATH AND CORONARY ANGIOGRAPHY N/A 11/16/2021   Procedure: LEFT HEART CATH AND CORONARY ANGIOGRAPHY;  Surgeon: Leonie Man, MD;  Location: [facility];  Service: Cardiovascular;  Laterality: N/A;    REPLACEMENT TOTAL KNEE Right    SIMPLE MASTECTOMY WITH AXILLARY SENTINEL NODE BIOPSY Left 06/25/2022   Procedure: SIMPLE MASTECTOMY;  Surgeon: Aviva Signs, MD;  Location: [facility];  Service: General;  Laterality: Left;   TEE WITHOUT CARDIOVERSION N/A 11/16/2021   Procedure: TRANSESOPHAGEAL ECHOCARDIOGRAM (TEE);  Surgeon: Gaye Pollack, MD;  Location: [facility];  Service: Open Heart Surgery;  Laterality: N/A;   TUBAL LIGATION     Social History:  reports that she has been smoking cigarettes. She has a 20.00 pack-year smoking history. She has never used smokeless tobacco. She reports that she does not drink alcohol and does not use drugs.  Allergies  Allergen Reactions   Latex Rash    Family History  Problem Relation Age of Onset   COPD Mother    Alcohol abuse Father    Hypertension Sister    Heart attack Brother     Prior to Admission medications   Medication Sig Start Date End Date Taking? Authorizing Provider  anastrozole (ARIMIDEX) 1 MG tablet Take 1 tablet (1 mg total) by mouth daily. Patient not taking: Reported on 06/24/2022 12/17/21   Derek Jack, MD  Ascorbic Acid (VITAMIN C) 1000 MG tablet Take 1,000 mg by mouth daily.    [provider]  aspirin EC 81 MG tablet Take 1 tablet (81 mg total) by mouth daily. Swallow whole. 11/23/21   Nani Skillern, PA-C  cholecalciferol (VITAMIN D3) 25 MCG (1000 UNIT) tablet Take 1,000 Units by mouth daily.    [provider]  clopidogrel (PLAVIX) 75 MG tablet TAKE 1 TABLET BY MOUTH EVERY DAY 07/19/22   Satira Sark, MD  cyclobenzaprine (FLEXERIL) 10 MG tablet Take 10 mg by mouth 2 (two) times daily as needed for muscle spasms. 07/17/20   [provider]  DULoxetine (CYMBALTA) 60 MG capsule Take 60 mg by mouth daily.    [provider]  ezetimibe (ZETIA) 10 MG tablet Take 10 mg by mouth daily.    [provider]  furosemide (LASIX) 40 MG tablet TAKE 1 AND 1/2 TABLETS(60 MG) BY MOUTH DAILY  07/19/22   Satira Sark, MD  glimepiride (AMARYL) 4 MG tablet Take 4 mg by mouth daily with breakfast.    [provider]  hydrOXYzine (ATARAX) 25 MG tablet Take 25 mg by mouth daily.    [provider]  insulin glargine (LANTUS) 100 UNIT/ML injection Inject 40 Units into the skin at bedtime.    [provider]  isosorbide mononitrate (IMDUR) 60 MG 24 hr tablet TAKE 1 TABLET(60 MG) BY MOUTH DAILY 07/19/22   Satira Sark, MD  losartan (COZAAR) 50 MG tablet Take 0.5 tablets (25 mg total) by mouth daily. 01/05/22   Satira Sark, MD  metFORMIN (GLUCOPHAGE) 500 MG tablet Take 500-1,000 mg by mouth See admin instructions. Take 2 tablets (1000 mg) in the morning and Take 1 tablets (500 mg) in the evening  [provider]  metoprolol tartrate (LOPRESSOR) 25 MG tablet Take 1 tablet (25 mg total) by mouth 2 (two) times daily. 11/23/21   Lars Pinks M, PA-C  mometasone (ELOCON) 0.1 % ointment Apply 1 Application topically daily. 11/05/21   [provider]  nitroGLYCERIN (NITROSTAT) 0.4 MG SL tablet Place 1 tablet (0.4 mg total) under the tongue every 5 (five) minutes x 3 doses as needed for chest pain (if no relief after 3rd dose, proceed to ED or call 911). 07/08/22   Satira Sark, MD  ondansetron (ZOFRAN) 4 MG tablet Take 4 mg by mouth every 8 (eight) hours as needed for nausea or vomiting.    [provider]  oxyCODONE-acetaminophen (PERCOCET) 10-325 MG tablet Take 1 tablet by mouth every 4 (four) hours as needed for pain. 11/23/21   Nani Skillern, PA-C  pregabalin (LYRICA) 75 MG capsule Take 75-150 mg by mouth See admin instructions. Take 2 tablets (150 mg) in the morning & Take 1 tablet (75 mg) at bedtime    [provider]  rosuvastatin (CRESTOR) 10 MG tablet Take 10 mg by mouth daily.    [provider]  triamcinolone ointment (KENALOG) 0.1 % Apply 1 Application topically daily as needed (For rash).  08/10/21 08/10/22  [provider]  vitamin B-12 (CYANOCOBALAMIN) 1000 MCG tablet Take 1,000 mcg by mouth daily.    [provider]    Physical Exam: Vitals:   07/25/22 1900 07/25/22 1930 07/25/22 1954 07/25/22 2057  BP: 139/71 (!) 156/105 127/70 (!) 140/59  Pulse: 79 78 77 75  Resp: (!) 27 (!) 27 (!) 21 20  Temp:    98.7 F (37.1 C)  TempSrc:    Oral  SpO2: 95% 96% 98% 97%  Weight:      Height:       1.  General: Patient lying supine in bed,  no acute distress   2. Psychiatric: Alert and oriented x 3, mood and behavior normal for situation, pleasant and cooperative with exam   3. Neurologic: Speech and language are normal, face is symmetric, moves all 4 extremities voluntarily, at baseline without acute deficits on limited exam   4. HEENMT:  Head is atraumatic, normocephalic, pupils reactive to light, neck is supple, trachea is midline, mucous membranes are moist   5. Respiratory : Lungs are clear to auscultation bilaterally without wheezing, rhonchi, rales, no cyanosis, no increase in work of breathing or accessory muscle use   6. Cardiovascular : Heart rate normal, rhythm is regular, no murmurs, rubs or gallops, no peripheral edema, peripheral pulses palpated   7. Gastrointestinal:  Abdomen is soft, nondistended, nontender to palpation bowel sounds active, no masses or organomegaly palpated   8. Skin:  Clean dry and intact bandage over left breast outer quadrants   9.Musculoskeletal:  No acute deformities or trauma, no asymmetry in tone, no peripheral edema, peripheral pulses palpated, no tenderness to palpation in the extremities  Data Reviewed: In the ED - Temp 98.9-100.4, heart rate 78-82, respiratory rate 16-27, blood pressure 139/71-153/76, satting 95-96% on room air No leukocytosis at 6.6, hemoglobin 12.3, platelets 208 Chemistry is unremarkable Albumin is slightly low at 3.0 Lactic acid normal x 2 Wound culture taken in the ED CT chest  shows status post left mastectomy with a large abscess 5.5 x 14.4 x 7.9 cm.  There is also an 8 mm reactive lymph node in the axilla Chest x-ray shows no cardiopulmonary disease Vancomycin started in the ED 1 L  LR given in the ED Tylenol given for pain and fever in the ED Admission requested by general surgery for postop abscess Assessment and Plan: * Abscess after procedure - Seen on CT left mastectomy surgical site - Abscess size 5.5 x 14.4 x 7.9 cm - Wound culture taken in the ED - General surgery consulted and initially advised to discharge patient home, patient was not comfortable going home so they advised hospitalist admission - Patient was febrile at presentation and tachypnic but there is no sign of endorgan damage - Continue vancomycin -GEN surge to see in the a.m., holding Plavix keeping patient n.p.o.  S/P left mastectomy - Surgery done 2/2 - Abscess at surgical site - General surgery to see in the a.m.  Coronary artery disease - Continue aspirin, losartan, Imdur, Crestor - Holding Plavix for possible procedure in the a.m. - Continue to monitor  Hyperlipidemia - Continue Zetia and Crestor  Hypertension - Continue losartan  Type 2 diabetes mellitus with complication, with long-term current use of insulin (HCC) - 40 units of insulin at baseline - Continue reduced dose of 25 units basal insulin - Sliding scale coverage - Currently n.p.o. for possible procedure in the a.m.      Advance Care Planning:   Code Status: Full Code  Consults: General surgery  Family Communication: No family at bedside  Severity of Illness: The appropriate patient status for this patient is OBSERVATION. Observation status is judged to be reasonable and necessary in order to provide the required intensity of service to ensure the patient's safety. The patient's presenting symptoms, physical exam findings, and initial radiographic and laboratory data in the context of their medical  condition is felt to place them at decreased risk for further clinical deterioration. Furthermore, it is anticipated that the patient will be medically stable for discharge from the hospital within 2 midnights of admission.   Author: Rolla Plate, DO 07/25/2022 9:27 PM  For on call review [email or web address].

## 2022-07-25 NOTE — Assessment & Plan Note (Addendum)
-  Continue modified carbohydrate diet -Continue to follow CBGs fluctuation and adjust hypoglycemic regimen as needed -Continue sliding scale insulin and long acting.

## 2022-07-25 NOTE — ED Provider Notes (Signed)
[place] EMERGENCY DEPARTMENT AT [facility] Provider Note   CSN: [id number] Arrival date & time: 07/25/22  1631     History  Chief Complaint  Patient presents with   Post-op Problem    Katelyn Kaufman is a 72 y.o. female.  HPI      Katelyn Kaufman is a 72 y.o. female with past medical history of COPD, type 2 diabetes, hypertension, coronary artery disease status post CABG x 3, left breast cancer, diagnosed last summer, who presents to the Emergency Department complaining of pain, redness and copious drainage from surgical wound.  She had left mastectomy on 06/25/2022 by Dr. Lovell Sheehan had follow-up and staple removal 2 weeks ago.  She notes drainage from the wound that has gradually worsened over the last several days.  Fever and chills at home.  She endorses nausea and vomiting today.  Decreased appetite.  She states the drainage from the wound is constant and she has pain to the left chest area that is significantly tender when touched.  States she was diagnosed with breast cancer last summer but has not started any cancer therapies.  Has an appointment this week with her oncologist, Dr. Ellin Saba    Home Medications Prior to Admission medications   Medication Sig Start Date End Date Taking? Authorizing Provider  anastrozole (ARIMIDEX) 1 MG tablet Take 1 tablet (1 mg total) by mouth daily. Patient not taking: Reported on 06/24/2022 12/17/21   Doreatha Massed, MD  Ascorbic Acid (VITAMIN C) 1000 MG tablet Take 1,000 mg by mouth daily.    [provider]  aspirin EC 81 MG tablet Take 1 tablet (81 mg total) by mouth daily. Swallow whole. 11/23/21   Ardelle Balls, PA-C  cholecalciferol (VITAMIN D3) 25 MCG (1000 UNIT) tablet Take 1,000 Units by mouth daily.    [provider]  clopidogrel (PLAVIX) 75 MG tablet TAKE 1 TABLET BY MOUTH EVERY DAY 07/19/22   Jonelle Sidle, MD  cyclobenzaprine (FLEXERIL) 10 MG tablet Take 10 mg by mouth 2 (two) times  daily as needed for muscle spasms. 07/17/20   [provider]  DULoxetine (CYMBALTA) 60 MG capsule Take 60 mg by mouth daily.    [provider]  ezetimibe (ZETIA) 10 MG tablet Take 10 mg by mouth daily.    [provider]  furosemide (LASIX) 40 MG tablet TAKE 1 AND 1/2 TABLETS(60 MG) BY MOUTH DAILY 07/19/22   Jonelle Sidle, MD  glimepiride (AMARYL) 4 MG tablet Take 4 mg by mouth daily with breakfast.    [provider]  hydrOXYzine (ATARAX) 25 MG tablet Take 25 mg by mouth daily.    [provider]  insulin glargine (LANTUS) 100 UNIT/ML injection Inject 40 Units into the skin at bedtime.    [provider]  isosorbide mononitrate (IMDUR) 60 MG 24 hr tablet TAKE 1 TABLET(60 MG) BY MOUTH DAILY 07/19/22   Jonelle Sidle, MD  losartan (COZAAR) 50 MG tablet Take 0.5 tablets (25 mg total) by mouth daily. 01/05/22   Jonelle Sidle, MD  metFORMIN (GLUCOPHAGE) 500 MG tablet Take 500-1,000 mg by mouth See admin instructions. Take 2 tablets (1000 mg) in the morning and Take 1 tablets (500 mg) in the evening    [provider]  metoprolol tartrate (LOPRESSOR) 25 MG tablet Take 1 tablet (25 mg total) by mouth 2 (two) times daily. 11/23/21   Doree Fudge M, PA-C  mometasone (ELOCON) 0.1 % ointment Apply 1 Application topically  daily. 11/05/21   [provider]  nitroGLYCERIN (NITROSTAT) 0.4 MG SL tablet Place 1 tablet (0.4 mg total) under the tongue every 5 (five) minutes x 3 doses as needed for chest pain (if no relief after 3rd dose, proceed to ED or call 911). 07/08/22   Jonelle Sidle, MD  ondansetron (ZOFRAN) 4 MG tablet Take 4 mg by mouth every 8 (eight) hours as needed for nausea or vomiting.    [provider]  oxyCODONE-acetaminophen (PERCOCET) 10-325 MG tablet Take 1 tablet by mouth every 4 (four) hours as needed for pain. 11/23/21   Ardelle Balls, PA-C  pregabalin (LYRICA) 75 MG capsule Take 75-150 mg  by mouth See admin instructions. Take 2 tablets (150 mg) in the morning & Take 1 tablet (75 mg) at bedtime    [provider]  rosuvastatin (CRESTOR) 10 MG tablet Take 10 mg by mouth daily.    [provider]  triamcinolone ointment (KENALOG) 0.1 % Apply 1 Application topically daily as needed (For rash). 08/10/21 08/10/22  [provider]  vitamin B-12 (CYANOCOBALAMIN) 1000 MCG tablet Take 1,000 mcg by mouth daily.    [provider]      Allergies    Latex    Review of Systems   Review of Systems  Constitutional:  Positive for appetite change, chills and fever.  Respiratory:  Negative for cough and shortness of breath.   Cardiovascular:  Positive for chest pain (Left chest wall pain drainage and redness).  Gastrointestinal:  Positive for nausea and vomiting. Negative for abdominal pain.  Genitourinary:  Negative for difficulty urinating.  Musculoskeletal:  Negative for arthralgias.  Skin:  Positive for color change and wound.  Neurological:  Negative for dizziness, weakness and numbness.  Psychiatric/Behavioral:  Negative for confusion.     Physical Exam Updated Vital Signs BP (!) 153/75   Pulse 82   Temp (!) 100.4 F (38 C) (Oral)   Resp 20   Ht 5\' 4"  (1.626 m)   Wt 106.1 kg   SpO2 96%   BMI 40.17 kg/m  Physical Exam Vitals and nursing note reviewed.  Constitutional:      Appearance: She is ill-appearing. She is not diaphoretic.  HENT:     Mouth/Throat:     Mouth: Mucous membranes are moist.     Pharynx: Oropharynx is clear.  Cardiovascular:     Rate and Rhythm: Normal rate and regular rhythm.     Pulses: Normal pulses.  Pulmonary:     Effort: Pulmonary effort is normal. No respiratory distress.  Chest:     Chest wall: Tenderness present. No crepitus.     Comments: Patient is status post left mastectomy.  There is some central wound dehiscence with significant serous drainage.  There is induration and some erythema along the  incision line. She has tenderness of the upper breast area as well. No lymphangitis  See attached photos Abdominal:     General: There is no distension.     Palpations: Abdomen is soft.     Tenderness: There is no abdominal tenderness.  Musculoskeletal:        General: Normal range of motion.     Cervical back: Normal range of motion.  Skin:    General: Skin is warm.     Capillary Refill: Capillary refill takes less than 2 seconds.     Findings: Erythema present.  Neurological:     General: No focal deficit present.     Mental Status: She  is alert.     Sensory: No sensory deficit.     Motor: No weakness.        ED Results / Procedures / Treatments   Labs (all labs ordered are listed, but only abnormal results are displayed) Labs Reviewed  COMPREHENSIVE METABOLIC PANEL - Abnormal; Notable for the following components:      Result Value   Sodium 130 (*)    Chloride 97 (*)    Glucose, Bld 171 (*)    Creatinine, Ser 1.11 (*)    Albumin 3.0 (*)    GFR, Estimated 53 (*)    All other components within normal limits  CBC WITH DIFFERENTIAL/PLATELET - Abnormal; Notable for the following components:   HCT 35.9 (*)    Monocytes Absolute 1.7 (*)    All other components within normal limits  CULTURE, BLOOD (ROUTINE X 2)  CULTURE, BLOOD (ROUTINE X 2)  AEROBIC/ANAEROBIC CULTURE W GRAM STAIN (SURGICAL/DEEP WOUND)  LACTIC ACID, PLASMA  PROTIME-INR  APTT  LACTIC ACID, PLASMA  URINALYSIS, W/ REFLEX TO CULTURE (INFECTION SUSPECTED)    EKG None  Radiology CT Chest W Contrast  Result Date: 07/25/2022 CLINICAL DATA:  Status post left mastectomy on 2/5, now with infection, cellulitis versus abscess EXAM: CT CHEST WITH CONTRAST TECHNIQUE: Multidetector CT imaging of the chest was performed during intravenous contrast administration. RADIATION DOSE REDUCTION: This exam was performed according to the departmental dose-optimization program which includes automated exposure control,  adjustment of the mA and/or kV according to patient size and/or use of iterative reconstruction technique. CONTRAST:  75mL OMNIPAQUE IOHEXOL 300 MG/ML  SOLN COMPARISON:  None Available. FINDINGS: Cardiovascular: The heart is normal in size. No pericardial effusion. No evidence of thoracic aortic aneurysm. Atherosclerotic calcifications of the aortic arch. Severe three-vessel coronary atherosclerosis. Postsurgical changes related to prior CABG. Mediastinum/Nodes: Small left axillary nodes measuring up to 8 mm short axis (series 2/images 30 and 52), warranting attention on follow-up. No suspicious mediastinal lymphadenopathy. Visualized thyroid is unremarkable. Lungs/Pleura: Mild centrilobular and paraseptal emphysematous changes, upper lung predominant. No suspicious pulmonary nodules. No focal consolidation. No pleural effusion or pneumothorax. Upper Abdomen: Visualized upper abdomen is grossly unremarkable, noting vascular calcifications. Musculoskeletal: Status post left mastectomy. Associated 5.5 x 14.4 x 7.9 cm fluid and gas collection in the surgical bed. While postoperative seroma is possible, given the clinical setting (approximally 1 month since initial surgery), this appearance is worrisome for postoperative infection/abscess. Overlying subcutaneous stranding with cutaneous thickening (series 2/image 87), suggesting associated cellulitis. Degenerative changes of the mid/lower thoracic spine. Median sternotomy. IMPRESSION: Status post left mastectomy. 5.5 x 14.4 x 7.9 cm fluid and gas collection in the surgical bed, worrisome for postoperative infection/abscess. Associated cellulitis. Small left axillary nodes measuring up to 8 mm short axis, possibly reactive, but warranting attention on follow-up. Aortic Atherosclerosis (ICD10-I70.0) and Emphysema (ICD10-J43.9). Electronically Signed   By: Charline Bills M.D.   On: 07/25/2022 18:09   DG Chest Port 1 View  Result Date: 07/25/2022 CLINICAL DATA:   Sepsis. EXAM: PORTABLE CHEST 1 VIEW COMPARISON:  Chest x-ray 12/06/2021 and older FINDINGS: Status post median sternotomy. Mediastinal clips. Overlapping cardiac leads. No consolidation, pneumothorax or effusion. No edema. Normal cardiopericardial silhouette. Eventration of the right hemidiaphragm. IMPRESSION: No acute cardiopulmonary disease Electronically Signed   By: Karen Kays M.D.   On: 07/25/2022 17:23    Procedures Procedures    Medications Ordered in ED Medications  lactated ringers bolus 1,000 mL (0 mLs Intravenous Stopped 07/25/22 1835)  acetaminophen (TYLENOL)  tablet 650 mg (650 mg Oral Given 07/25/22 1727)  iohexol (OMNIPAQUE) 300 MG/ML solution 75 mL (75 mLs Intravenous Contrast Given 07/25/22 1751)  vancomycin (VANCOCIN) IVPB 1000 mg/200 mL premix (1,000 mg Intravenous New Bag/Given 07/25/22 1835)    ED Course/ Medical Decision Making/ A&P                             Medical Decision Making Patient here from home with reported fever, chills, nausea vomiting, copious drainage and redness along surgical incision.  History of left mastectomy in early February.  States she had wound VAC and staples removed mid February and area has been draining since that time.  Worse over the last several days.  States the incision opened up few days ago.  States her breast cancer was diagnosed last summer, she is scheduled to see oncology this week.  Has not started any cancer treatments  On exam, patient is ill-appearing, low-grade fever no tachycardia or tachypnea at this time.  There is copious serous drainage of the wound with erythema and induration along the distal aspect of the incision.  Some wound dehiscence noted.  Patient concerning for sepsis, abscess versus cellulitis of postop wound.  Her symptoms include high risk of complications.  Will obtain labs, EKG and CT of the chest for further evaluation.  Wound culture also obtained  Amount and/or Complexity of Data Reviewed Labs: ordered.     Details: Labs interpreted by me, no evidence of leukocytosis, hemoglobin unremarkable.  Lactic acid 1.4.  Chemistries mild hyponatremia with sodium of 130.  Serum creatinine near baseline at 1.1.  Aerobic/anaerobic wound culture pending Radiology: ordered.    Details: Chest x-ray without acute cardiopulmonary disease.  CT chest ordered for further evaluation of possible abscess versus cellulitis.  CT shows 5.5 x 14 x 8 cm gas and fluid collection in the surgical bed worrisome for postop infection/abscess ECG/medicine tests: ordered. Discussion of management or test interpretation with external provider(s): Patient with postop infection.  Receiving IV vancomycin.  Consulted with general surgery, Dr. Robyne Peers, felt that patient could be managed as outpatient, but if the patient prefers hospital admission, surgery will see tomorrow, requested patient be n.p.o. after midnight and hold her Plavix.  Discussed with [facility] hospitalist, Dr. Carren Rang who will admit            Final Clinical Impression(s) / ED Diagnoses Final diagnoses:  Post-operative wound abscess    Rx / DC Orders ED Discharge Orders     None         Pauline Aus, PA-C 07/25/22 1942    Glendora Score, MD 07/26/22 1209

## 2022-07-25 NOTE — Assessment & Plan Note (Addendum)
-   Blood pressure soft most likely, analgesics usage and current situation -Imdur dose has been adjusted; continue metoprolol. -Losartan placed on hold -Follow-up vital signs.

## 2022-07-25 NOTE — Assessment & Plan Note (Signed)
-   Continue Zetia and Crestor -Heart healthy diet discussed with patient.

## 2022-07-25 NOTE — Assessment & Plan Note (Signed)
-  check TSH

## 2022-07-25 NOTE — ED Notes (Signed)
Patient transported to CT 

## 2022-07-25 NOTE — ED Triage Notes (Signed)
Pt reports she had surgery on her left breast on 2/5, sutures were removed on 2/15 and is now having a large amount of drainage.

## 2022-07-25 NOTE — Progress Notes (Signed)
Pharmacy Antibiotic Note  Katelyn Kaufman is a 72 y.o. female admitted on 07/25/2022 with  surgical wound drainage .  Pharmacy has been consulted for vanc dosing.  Pt with recent mastectomy who presented with fever, chills, and drainage from wound. Vanc has been ordered for the infection. Likely to be staph aureus.  Scr 1.11 Wbc wnl Goal trough - 10-15  Plan: Addition vanc 1g x1 to make 2g load Vanc '750mg'$  IV q12 x7d Levels as needed  Height: '5\' 4"'$  (162.6 cm) Weight: 106.1 kg (234 lb) IBW/kg (Calculated) : 54.7  Temp (24hrs), Avg:99.7 F (37.6 C), Min:98.9 F (37.2 C), Max:100.4 F (38 C)  Recent Labs  Lab 07/25/22 1715 07/25/22 1845  WBC 6.6  --   CREATININE 1.11*  --   LATICACIDVEN 1.4 1.2    Estimated Creatinine Clearance: 55.3 mL/min (A) (by C-G formula based on SCr of 1.11 mg/dL (H)).    Allergies  Allergen Reactions   Latex Rash    Antimicrobials this admission: 3/3 vanc>>  Dose adjustments this admission:   Microbiology results: 3/3 blood>>  Onnie Boer, PharmD, [place], AAHIVP, CPP Infectious Disease Pharmacist 07/25/2022 8:08 PM

## 2022-07-25 NOTE — Assessment & Plan Note (Addendum)
-   S/p surgical debridement and deep cultures taken. -Currently afebrile and with normal WBCs -Continue current IV antibiotics and follow culture result -Continue to maintain adequate hydration, as needed analgesics and supportive care -Follow general surgery recommendations regarding postoperative care and further wound management; currently wound VAC in place.

## 2022-07-25 NOTE — Progress Notes (Signed)
Patient admitted to floor. Alert and oriented x4, denies pain. Oriented to room and call bell within reach. Patient initial admission assessment performed.

## 2022-07-25 NOTE — Assessment & Plan Note (Signed)
-   Smokes half a pack per day - Counseled on importance of cessation especially in the setting of status post CABG - Request nicotine patch - 14 mg nicotine patch ordered

## 2022-07-26 ENCOUNTER — Telehealth: Payer: Self-pay | Admitting: Cardiology

## 2022-07-26 ENCOUNTER — Encounter (HOSPITAL_COMMUNITY)
Admission: EM | Disposition: A | Discharge status: Skilled Nursing Facility | Payer: Self-pay | Source: Home / Self Care | Attending: Family Medicine

## 2022-07-26 ENCOUNTER — Observation Stay (HOSPITAL_COMMUNITY): Payer: Medicare HMO | Admitting: Certified Registered"

## 2022-07-26 ENCOUNTER — Encounter (HOSPITAL_COMMUNITY): Payer: Self-pay | Admitting: Family Medicine

## 2022-07-26 DIAGNOSIS — Y838 Other surgical procedures as the cause of abnormal reaction of the patient, or of later complication, without mention of misadventure at the time of the procedure: Secondary | ICD-10-CM | POA: Diagnosis present

## 2022-07-26 DIAGNOSIS — N6489 Other specified disorders of breast: Secondary | ICD-10-CM

## 2022-07-26 DIAGNOSIS — F32A Depression, unspecified: Secondary | ICD-10-CM | POA: Diagnosis present

## 2022-07-26 DIAGNOSIS — E119 Type 2 diabetes mellitus without complications: Secondary | ICD-10-CM | POA: Diagnosis present

## 2022-07-26 DIAGNOSIS — I252 Old myocardial infarction: Secondary | ICD-10-CM

## 2022-07-26 DIAGNOSIS — T8131XA Disruption of external operation (surgical) wound, not elsewhere classified, initial encounter: Secondary | ICD-10-CM | POA: Diagnosis present

## 2022-07-26 DIAGNOSIS — T8149XA Infection following a procedure, other surgical site, initial encounter: Secondary | ICD-10-CM | POA: Diagnosis not present

## 2022-07-26 DIAGNOSIS — E785 Hyperlipidemia, unspecified: Secondary | ICD-10-CM | POA: Diagnosis present

## 2022-07-26 DIAGNOSIS — Z1623 Resistance to quinolones and fluoroquinolones: Secondary | ICD-10-CM | POA: Diagnosis present

## 2022-07-26 DIAGNOSIS — F17210 Nicotine dependence, cigarettes, uncomplicated: Secondary | ICD-10-CM | POA: Diagnosis present

## 2022-07-26 DIAGNOSIS — Z6841 Body Mass Index (BMI) 40.0 and over, adult: Secondary | ICD-10-CM | POA: Diagnosis not present

## 2022-07-26 DIAGNOSIS — B9562 Methicillin resistant Staphylococcus aureus infection as the cause of diseases classified elsewhere: Secondary | ICD-10-CM | POA: Diagnosis present

## 2022-07-26 DIAGNOSIS — I2510 Atherosclerotic heart disease of native coronary artery without angina pectoris: Secondary | ICD-10-CM | POA: Diagnosis present

## 2022-07-26 DIAGNOSIS — I10 Essential (primary) hypertension: Secondary | ICD-10-CM

## 2022-07-26 DIAGNOSIS — R1033 Periumbilical pain: Secondary | ICD-10-CM | POA: Diagnosis present

## 2022-07-26 DIAGNOSIS — Z794 Long term (current) use of insulin: Secondary | ICD-10-CM

## 2022-07-26 DIAGNOSIS — G4733 Obstructive sleep apnea (adult) (pediatric): Secondary | ICD-10-CM | POA: Diagnosis present

## 2022-07-26 DIAGNOSIS — E1149 Type 2 diabetes mellitus with other diabetic neurological complication: Secondary | ICD-10-CM

## 2022-07-26 DIAGNOSIS — C50912 Malignant neoplasm of unspecified site of left female breast: Secondary | ICD-10-CM | POA: Diagnosis present

## 2022-07-26 DIAGNOSIS — B964 Proteus (mirabilis) (morganii) as the cause of diseases classified elsewhere: Secondary | ICD-10-CM | POA: Diagnosis present

## 2022-07-26 DIAGNOSIS — S2002XA Contusion of left breast, initial encounter: Secondary | ICD-10-CM

## 2022-07-26 DIAGNOSIS — E118 Type 2 diabetes mellitus with unspecified complications: Secondary | ICD-10-CM | POA: Diagnosis not present

## 2022-07-26 DIAGNOSIS — B965 Pseudomonas (aeruginosa) (mallei) (pseudomallei) as the cause of diseases classified elsewhere: Secondary | ICD-10-CM | POA: Diagnosis present

## 2022-07-26 DIAGNOSIS — L7634 Postprocedural seroma of skin and subcutaneous tissue following other procedure: Secondary | ICD-10-CM

## 2022-07-26 DIAGNOSIS — E010 Iodine-deficiency related diffuse (endemic) goiter: Secondary | ICD-10-CM | POA: Diagnosis present

## 2022-07-26 DIAGNOSIS — Z96651 Presence of right artificial knee joint: Secondary | ICD-10-CM | POA: Diagnosis present

## 2022-07-26 DIAGNOSIS — J449 Chronic obstructive pulmonary disease, unspecified: Secondary | ICD-10-CM | POA: Diagnosis present

## 2022-07-26 DIAGNOSIS — F17200 Nicotine dependence, unspecified, uncomplicated: Secondary | ICD-10-CM | POA: Diagnosis not present

## 2022-07-26 DIAGNOSIS — T8141XA Infection following a procedure, superficial incisional surgical site, initial encounter: Secondary | ICD-10-CM | POA: Diagnosis present

## 2022-07-26 DIAGNOSIS — T8189XA Other complications of procedures, not elsewhere classified, initial encounter: Secondary | ICD-10-CM | POA: Diagnosis present

## 2022-07-26 DIAGNOSIS — L7632 Postprocedural hematoma of skin and subcutaneous tissue following other procedure: Secondary | ICD-10-CM | POA: Diagnosis present

## 2022-07-26 DIAGNOSIS — I96 Gangrene, not elsewhere classified: Secondary | ICD-10-CM | POA: Diagnosis present

## 2022-07-26 DIAGNOSIS — Z7984 Long term (current) use of oral hypoglycemic drugs: Secondary | ICD-10-CM

## 2022-07-26 HISTORY — PX: WOUND EXPLORATION: SHX6188

## 2022-07-26 LAB — TSH: TSH: 1.04 u[IU]/mL (ref 0.350–4.500)

## 2022-07-26 LAB — COMPREHENSIVE METABOLIC PANEL
ALT: 12 U/L (ref 0–44)
AST: 14 U/L — ABNORMAL LOW (ref 15–41)
Albumin: 2.5 g/dL — ABNORMAL LOW (ref 3.5–5.0)
Alkaline Phosphatase: 48 U/L (ref 38–126)
Anion gap: 9 (ref 5–15)
BUN: 11 mg/dL (ref 8–23)
CO2: 24 mmol/L (ref 22–32)
Calcium: 9.9 mg/dL (ref 8.9–10.3)
Chloride: 98 mmol/L (ref 98–111)
Creatinine, Ser: 1.06 mg/dL — ABNORMAL HIGH (ref 0.44–1.00)
GFR, Estimated: 56 mL/min — ABNORMAL LOW (ref >60)
Glucose, Bld: 162 mg/dL — ABNORMAL HIGH (ref 70–99)
Potassium: 3.8 mmol/L (ref 3.5–5.1)
Sodium: 131 mmol/L — ABNORMAL LOW (ref 135–145)
Total Bilirubin: 0.6 mg/dL (ref 0.3–1.2)
Total Protein: 6.4 g/dL — ABNORMAL LOW (ref 6.5–8.1)

## 2022-07-26 LAB — GLUCOSE, CAPILLARY
Glucose-Capillary: 135 mg/dL — ABNORMAL HIGH (ref 70–99)
Glucose-Capillary: 99 mg/dL (ref 70–99)
Glucose-Capillary: 86 mg/dL (ref 70–99)
Glucose-Capillary: 205 mg/dL — ABNORMAL HIGH (ref 70–99)
Glucose-Capillary: 250 mg/dL — ABNORMAL HIGH (ref 70–99)

## 2022-07-26 LAB — CBC WITH DIFFERENTIAL/PLATELET
Abs Immature Granulocytes: 0.02 10*3/uL (ref 0.00–0.07)
Basophils Absolute: 0 10*3/uL (ref 0.0–0.1)
Basophils Relative: 1 %
Eosinophils Absolute: 0 10*3/uL (ref 0.0–0.5)
Eosinophils Relative: 0 %
HCT: 34.3 % — ABNORMAL LOW (ref 36.0–46.0)
Hemoglobin: 11.8 g/dL — ABNORMAL LOW (ref 12.0–15.0)
Immature Granulocytes: 0 %
Lymphocytes Relative: 35 %
Lymphs Abs: 2.2 10*3/uL (ref 0.7–4.0)
MCH: 30.8 pg (ref 26.0–34.0)
MCHC: 34.4 g/dL (ref 30.0–36.0)
MCV: 89.6 fL (ref 80.0–100.0)
Monocytes Absolute: 1.9 10*3/uL — ABNORMAL HIGH (ref 0.1–1.0)
Monocytes Relative: 30 %
Neutro Abs: 2.1 10*3/uL (ref 1.7–7.7)
Neutrophils Relative %: 34 %
Platelets: 201 10*3/uL (ref 150–400)
RBC: 3.83 MIL/uL — ABNORMAL LOW (ref 3.87–5.11)
RDW: 13.3 % (ref 11.5–15.5)
WBC: 6.3 10*3/uL (ref 4.0–10.5)
nRBC: 0 % (ref 0.0–0.2)

## 2022-07-26 LAB — SURGICAL PCR SCREEN
MRSA, PCR: POSITIVE — AB
Staphylococcus aureus: POSITIVE — AB

## 2022-07-26 LAB — MAGNESIUM: Magnesium: 1.5 mg/dL — ABNORMAL LOW (ref 1.7–2.4)

## 2022-07-26 SURGERY — WOUND EXPLORATION
Anesthesia: General | Site: Breast | Laterality: Left

## 2022-07-26 MED ORDER — SUCCINYLCHOLINE CHLORIDE 200 MG/10ML IV SOSY
PREFILLED_SYRINGE | INTRAVENOUS | Status: DC | PRN
  Administered 2022-07-26: 140 mg via INTRAVENOUS

## 2022-07-26 MED ORDER — CHLORHEXIDINE GLUCONATE CLOTH 2 % EX PADS
6.0000 | MEDICATED_PAD | Freq: Once | CUTANEOUS | Status: AC
  Administered 2022-07-26: 6 via TOPICAL

## 2022-07-26 MED ORDER — MUPIROCIN 2 % EX OINT
1.0000 | TOPICAL_OINTMENT | Freq: Two times a day (BID) | CUTANEOUS | Status: AC
  Administered 2022-07-26 – 2022-07-30 (×9): 1 via NASAL
  Filled 2022-07-26 (×2): qty 22

## 2022-07-26 MED ORDER — ONDANSETRON HCL 4 MG/2ML IJ SOLN
INTRAMUSCULAR | Status: AC
  Filled 2022-07-26: qty 2

## 2022-07-26 MED ORDER — SODIUM CHLORIDE 0.9 % IR SOLN
Status: DC | PRN
  Administered 2022-07-26: 1000 mL
  Administered 2022-07-26: 3000 mL

## 2022-07-26 MED ORDER — FENTANYL CITRATE (PF) 100 MCG/2ML IJ SOLN
INTRAMUSCULAR | Status: DC | PRN
  Administered 2022-07-26: 50 ug via INTRAVENOUS

## 2022-07-26 MED ORDER — ROCURONIUM BROMIDE 10 MG/ML (PF) SYRINGE
PREFILLED_SYRINGE | INTRAVENOUS | Status: AC
  Filled 2022-07-26: qty 10

## 2022-07-26 MED ORDER — PHENYLEPHRINE HCL (PRESSORS) 10 MG/ML IV SOLN
INTRAVENOUS | Status: DC | PRN
  Administered 2022-07-26 (×3): 80 ug via INTRAVENOUS

## 2022-07-26 MED ORDER — LACTATED RINGERS IV SOLN: INTRAVENOUS | Status: DC

## 2022-07-26 MED ORDER — OXYCODONE HCL 5 MG/5ML PO SOLN: 5.0000 mg | Freq: Once | ORAL | Status: DC | PRN

## 2022-07-26 MED ORDER — NICOTINE 14 MG/24HR TD PT24: 14.0000 mg | MEDICATED_PATCH | Freq: Every day | TRANSDERMAL | Status: DC

## 2022-07-26 MED ORDER — OXYCODONE HCL 5 MG PO TABS: 5.0000 mg | ORAL_TABLET | Freq: Once | ORAL | Status: DC | PRN

## 2022-07-26 MED ORDER — ONDANSETRON HCL 4 MG/2ML IJ SOLN
INTRAMUSCULAR | Status: DC | PRN
  Administered 2022-07-26: 4 mg via INTRAVENOUS

## 2022-07-26 MED ORDER — VANCOMYCIN HCL 750 MG/150ML IV SOLN
750.0000 mg | Freq: Two times a day (BID) | INTRAVENOUS | Status: AC
  Administered 2022-07-26 – 2022-07-31 (×10): 750 mg via INTRAVENOUS
  Filled 2022-07-26 (×9): qty 150

## 2022-07-26 MED ORDER — ONDANSETRON HCL 4 MG/2ML IJ SOLN: 4.0000 mg | Freq: Once | INTRAMUSCULAR | Status: DC | PRN

## 2022-07-26 MED ORDER — CHLORHEXIDINE GLUCONATE CLOTH 2 % EX PADS: 6.0000 | MEDICATED_PAD | Freq: Once | CUTANEOUS | Status: DC

## 2022-07-26 MED ORDER — CHLORHEXIDINE GLUCONATE 0.12 % MT SOLN
15.0000 mL | Freq: Once | OROMUCOSAL | Status: AC
  Administered 2022-07-26: 15 mL via OROMUCOSAL

## 2022-07-26 MED ORDER — FENTANYL CITRATE PF 50 MCG/ML IJ SOSY: 25.0000 ug | PREFILLED_SYRINGE | INTRAMUSCULAR | Status: DC | PRN

## 2022-07-26 MED ORDER — SUCCINYLCHOLINE CHLORIDE 200 MG/10ML IV SOSY
PREFILLED_SYRINGE | INTRAVENOUS | Status: AC
  Filled 2022-07-26: qty 10

## 2022-07-26 MED ORDER — SODIUM CHLORIDE 0.9 % IV SOLN: INTRAVENOUS | Status: DC

## 2022-07-26 MED ORDER — CHLORHEXIDINE GLUCONATE 0.12 % MT SOLN
OROMUCOSAL | Status: AC
  Filled 2022-07-26: qty 15

## 2022-07-26 MED ORDER — ROCURONIUM BROMIDE 10 MG/ML (PF) SYRINGE
PREFILLED_SYRINGE | INTRAVENOUS | Status: DC | PRN
  Administered 2022-07-26: 20 mg via INTRAVENOUS

## 2022-07-26 MED ORDER — PROPOFOL 10 MG/ML IV BOLUS
INTRAVENOUS | Status: DC | PRN
  Administered 2022-07-26: 170 mg via INTRAVENOUS

## 2022-07-26 MED ORDER — SUGAMMADEX SODIUM 200 MG/2ML IV SOLN
INTRAVENOUS | Status: DC | PRN
  Administered 2022-07-26: 200 mg via INTRAVENOUS

## 2022-07-26 MED ORDER — ORAL CARE MOUTH RINSE: 15.0000 mL | Freq: Once | OROMUCOSAL | Status: AC

## 2022-07-26 MED ORDER — NICOTINE 14 MG/24HR TD PT24
14.0000 mg | MEDICATED_PATCH | Freq: Once | TRANSDERMAL | Status: AC
  Administered 2022-07-26: 14 mg via TRANSDERMAL
  Filled 2022-07-26: qty 1

## 2022-07-26 MED ORDER — FENTANYL CITRATE (PF) 100 MCG/2ML IJ SOLN
INTRAMUSCULAR | Status: AC
  Filled 2022-07-26: qty 2

## 2022-07-26 MED ORDER — LIDOCAINE HCL (CARDIAC) PF 100 MG/5ML IV SOSY
PREFILLED_SYRINGE | INTRAVENOUS | Status: DC | PRN
  Administered 2022-07-26: 100 mg via INTRAVENOUS

## 2022-07-26 SURGICAL SUPPLY — 22 items
APL PRP STRL LF DISP 70% ISPRP (MISCELLANEOUS) ×1
CANISTER WOUND CARE 500ML ATS (WOUND CARE) IMPLANT
CHLORAPREP W/TINT 26 (MISCELLANEOUS) ×1 IMPLANT
CLOTH BEACON ORANGE TIMEOUT ST (SAFETY) ×1 IMPLANT
COVER LIGHT HANDLE STERIS (MISCELLANEOUS) ×2 IMPLANT
DRSG VAC GRANUFOAM LG (GAUZE/BANDAGES/DRESSINGS) IMPLANT
ELECT REM PT RETURN 9FT ADLT (ELECTROSURGICAL) ×1
ELECTRODE REM PT RTRN 9FT ADLT (ELECTROSURGICAL) ×1 IMPLANT
GAUZE SPONGE 4X4 12PLY STRL (GAUZE/BANDAGES/DRESSINGS) ×1 IMPLANT
GLOVE BIOGEL PI IND STRL 7.0 (GLOVE) ×2 IMPLANT
GLOVE SURG SS PI 7.5 STRL IVOR (GLOVE) ×2 IMPLANT
GOWN STRL REUS W/TWL LRG LVL3 (GOWN DISPOSABLE) ×2 IMPLANT
INST SET MAJOR GENERAL (KITS) ×1 IMPLANT
IV NS IRRIG 3000ML ARTHROMATIC (IV SOLUTION) IMPLANT
KIT TURNOVER KIT A (KITS) ×1 IMPLANT
MANIFOLD NEPTUNE II (INSTRUMENTS) ×1 IMPLANT
NS IRRIG 1000ML POUR BTL (IV SOLUTION) ×1 IMPLANT
PACK MAJOR ABDOMINAL (CUSTOM PROCEDURE TRAY) ×1 IMPLANT
PAD ARMBOARD 7.5X6 YLW CONV (MISCELLANEOUS) ×1 IMPLANT
SET BASIN LINEN APH (SET/KITS/TRAYS/PACK) ×1 IMPLANT
SPONGE T-LAP 18X18 ~~LOC~~+RFID (SPONGE) ×1 IMPLANT
SWAB CULTURE ESWAB REG 1ML (MISCELLANEOUS) IMPLANT

## 2022-07-26 NOTE — TOC Progression Note (Signed)
  Transition of Care [facility]) Screening Note   Patient Details  Name: Katelyn Kaufman Date of Birth: May 28, 1950   Transition of Care [facility]) CM/SW Contact:    Shade Flood, LCSW Phone Number: 07/26/2022, 11:37 AM    SDOH concerns addressed.  Transition of Care Department [facility]) has reviewed patient and no TOC needs have been identified at this time. We will continue to monitor patient advancement through interdisciplinary progression rounds. If new patient transition needs arise, please place a TOC consult.

## 2022-07-26 NOTE — Discharge Instructions (Addendum)
-   1)Please give IV vancomycin, IV cefepime and p.o. metronidazole through 08/16/2022 as ordered for  Polymicrobial Bacterial Infection of  R-breast wound -Last Day of Therapy:  08/15/12 Labs - Once weekly:  CBC/D and BMP, Labs - Every other week:  ESR and CR  2)follow up with general surgeon Dr. Arnoldo Morale in about 4 weeks  3) continue wound VAC change Mondays Wednesdays and Fridays at [facility] facility     [facility] for Food:   [address] [place] [id number] Located: Basement of [place] back entrance off [address]).  You must call the Ministry before you are able to receive services. Food assistance,  utility assistance if funds are available Corporate treasurer for all of [employer])  ([place], [place] for [facility] area only) Walk-ins with  current ID and current address verification required. Services available on Wednesdays  and Thursdays: 9:30AM-12:00PM.  [facility] [place]: [address]  Serves hot meals from noon to 12:45 p.m. Mondays through Fridays; Food Pantry is open from 12:30 p.m. to 2:30 p.m. Mondays through Fridays  [place], [place] - [id number]  Application and I.D are required for services. You must have a copy of the current bill.  Call on Tuesdays for appointment and complete application between [id number].  This ministry will purchase fresh food and you will pick the food up at the ministry that  afternoon.   [facility] of [facility] Address: [address], [place], [place] [address] Phone: [phone] Website: www.adtsrc.org Services Offered: Meals on [employer] and Meals with Friends.  Home care, at home assisted living, [place]  for [place], transportation

## 2022-07-26 NOTE — Op Note (Signed)
Patient:  Katelyn Kaufman  DOB:  02-Nov-1950  MRN:  [id number]   Preop Diagnosis: Wound hematoma/seroma, status post left modified radical mastectomy  Postop Diagnosis: Same  Procedure: Incision and debridement of left mastectomy wound, wound VAC placement  Surgeon: Aviva Signs, MD  Anes: General endotracheal  Indications: Patient is a 72 year old black female status post a left modified radical mastectomy on 06/25/2022 who presents back with a draining wound along the mid to lateral aspect of her mastectomy incision site.  The risks and benefits of the procedure including bleeding, infection, and the possibility of keeping the wound open were fully explained to the patient, who gave informed consent.  Procedure note: The patient was placed in the supine position.  After induction of general endotracheal anesthesia, the abdomen was prepped and draped using usual sterile technique with Betadine.  Surgical site confirmation was performed.  The patient had some necrotic tissue with superficial wound dehiscence along the midportion of the incision.  The necrotic tissue was debrided.  Serosanguineous fluid was noted deep in the wound.  Aerobic and anaerobic cultures were taken and sent to microbiology.  This fluid was evacuated.  I inspected the wound and there was no frank abscess cavity identified.  It appeared that there was some necrosis of the wound superficially.  I copiously irrigated the wound with pulse lavage therapy.  A bleeding was controlled using Bovie electrocautery.  An approximately 15 x 18 black sponge was placed into the wound.  A second smaller sponge was placed overlying this in order to apply the wound VAC.  The wound VAC was applied to -125 mmHg pressure.  The patient tolerated the procedure well.  She was extubated in the operating room and transferred to PACU in stable condition.  Complications: None  EBL: Minimal  Specimen: Culture of wound, left mastectomy site

## 2022-07-26 NOTE — Interval H&P Note (Signed)
History and Physical Interval Note:  07/26/2022 11:19 AM  Katelyn Kaufman  has presented today for surgery, with the diagnosis of hematoma, left breast.  The various methods of treatment have been discussed with the patient and family. After consideration of risks, benefits and other options for treatment, the patient has consented to  Procedure(s): EVACUATION OF HEMATOMA BREAST (Left) as a surgical intervention.  The patient's history has been reviewed, patient examined, no change in status, stable for surgery.  I have reviewed the patient's chart and labs.  Questions were answered to the patient's satisfaction.     Aviva Signs

## 2022-07-26 NOTE — Anesthesia Preprocedure Evaluation (Signed)
Anesthesia Evaluation  Patient identified by MRN, date of birth, ID band Patient awake    Reviewed: Allergy & Precautions, H&P , NPO status , Patient's Chart, lab work & pertinent test results, reviewed documented beta blocker date and time   History of Anesthesia Complications (+) PONV and history of anesthetic complications  Airway Mallampati: III  TM Distance: >3 FB Neck ROM: Full    Dental  (+) Dental Advisory Given, Missing, Chipped   Pulmonary sleep apnea (non cmplaint) , COPD,  COPD inhaler, Current Smoker and Patient abstained from smoking.   Pulmonary exam normal breath sounds clear to auscultation       Cardiovascular Exercise Tolerance: Poor hypertension, Pt. on medications and Pt. on home beta blockers + CAD, + Past MI and + CABG  Normal cardiovascular exam Rhythm:Regular Rate:Normal   1. Left ventricular ejection fraction, by estimation, is 60 to 65%. The  left ventricle has normal function. The left ventricle has no regional  wall motion abnormalities. There is mild left ventricular hypertrophy.  Left ventricular diastolic parameters  are consistent with Grade I diastolic dysfunction (impaired relaxation).   2. Right ventricular systolic function is normal. The right ventricular  size is normal. Tricuspid regurgitation signal is inadequate for assessing  PA pressure.   3. The mitral valve is grossly normal. Trivial mitral valve  regurgitation.   4. The aortic valve is tricuspid. Aortic valve regurgitation is not  visualized.   5. The inferior vena cava is normal in size with greater than 50%  respiratory variability, suggesting right atrial pressure of 3 mmHg.     Neuro/Psych  PSYCHIATRIC DISORDERS Anxiety      Neuromuscular disease    GI/Hepatic Neg liver ROS,GERD  Controlled,,  Endo/Other  diabetes, Well Controlled, Type 2, Insulin Dependent, Oral Hypoglycemic Agents    Renal/GU negative Renal ROS   negative genitourinary   Musculoskeletal  (+) Arthritis  (chronic back pain and sciatica, takes oxycodone), Osteoarthritis,    Abdominal   Peds negative pediatric ROS (+)  Hematology negative hematology ROS (+)   Anesthesia Other Findings Preoperative Cardiac evaluation CAD s/p NSTEMI and CABGx3 in 10/2021 Patient had a non-STEMI with emergency CABG x 3 on 11/16/2021.  Patient is overall doing well. She reports rare sharp chest pain with movement.  She also has palpable chest pain on exam, she describes it as numbness and tingling, suspect this may be nerve pain.  She is on Lyrica.  Patient has breast cancer and is to undergo left simple mastectomy.  Initial preop clearance was 10/30/2021.  Surgery was subsequently deferred due to non-STEMI/CABG.  She is now 6 months out.  Patient is taking aspirin and Plavix.  I will discuss with MD how long to hold Plavix prior to surgery.  Can continue aspirin perioperatively.  Patient uses a cane at baseline, not very active.  METs are greater than 4.  EKG today shows normal sinus rhythm with no ischemic changes.  According to the revised cardiac risk index patient is class IV risk, 15% 30-day risk of death, MI, or cardiac arrest.  No further cardiac workup required prior to surgery.  We will contact surgeons office (Dr. Arnoldo Morale) with further instructions from MD regarding antiplatelet therapy.   HTN BP high, but she has not had medications today. Continue Imdur '60mg'$  daily, Losartan '50mg'$  daily, Lopressor '25mg'$ BID.   HLD LDL 214 on 07/2020. Re-check fasting lipid panel today.   DM2 Followed by PCP.  Recent A1c was 7.1.   Disposition: Follow  up in 3 month(s) with MD/APP      Signed, Cadence Ninfa Meeker, PA-C  05/18/2022 3:11 PM    [facility]    Reproductive/Obstetrics negative OB ROS                             Anesthesia Physical Anesthesia Plan  ASA: 3  Anesthesia Plan: General and General LMA    Post-op Pain Management: Dilaudid IV   Induction: Intravenous  PONV Risk Score and Plan: 4 or greater and Ondansetron  Airway Management Planned: Oral ETT  Additional Equipment:   Intra-op Plan:   Post-operative Plan: Extubation in OR  Informed Consent: I have reviewed the patients History and Physical, chart, labs and discussed the procedure including the risks, benefits and alternatives for the proposed anesthesia with the patient or authorized representative who has indicated his/her understanding and acceptance.       Plan Discussed with: CRNA and Surgeon  Anesthesia Plan Comments:         Anesthesia Quick Evaluation

## 2022-07-26 NOTE — Progress Notes (Signed)
Pt diaphoretic during the night, temp WDL. PRN Oxycodone given for reported pain. Vitals stable. Pt got very little sleep during the night.

## 2022-07-26 NOTE — Consult Note (Signed)
Reason for Consult: Postoperative hematoma, status post left modified radical mastectomy Referring Physician: Dr. Jearl Klinefelter is an 72 y.o. female.  HPI: Patient is a 72 year old black female status post a left modified radical mastectomy on 06/25/2022 who presents back with a 3-day history of worsening swelling and serosanguineous drainage from the midportion of the wound.  She states this occurred suddenly.  She has had some fevers and feels weak.  She denies any purulent drainage from the wound.  Patient presented to the emergency room and a CT scan of the abdomen pelvis revealed a large fluid-filled area in the lateral portion of the mastectomy site.  White blood cell count within normal limits.  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   COPD (chronic obstructive pulmonary disease) ([place])    Depression    Essential hypertension    Hyperlipidemia    Myocardial infarction ([place])    [place] to the LAD   OSA (obstructive sleep apnea)    CPAP qHS   PONV (postoperative nausea and vomiting)    Type 2 diabetes mellitus ([place])     Past Surgical History:  Procedure Laterality Date   BACK SURGERY     BREAST SURGERY     CATARACT EXTRACTION W/PHACO Right 10/24/2020   Procedure: CATARACT EXTRACTION PHACO AND INTRAOCULAR LENS PLACEMENT (IOC);  Surgeon: Baruch Goldmann, MD;  Location: [facility];  Service: Ophthalmology;  Laterality: Right;  CDE: 10.87   CATARACT EXTRACTION W/PHACO Left 12/01/2020   Procedure: CATARACT EXTRACTION PHACO AND INTRAOCULAR LENS PLACEMENT LEFT EYE;  Surgeon: Baruch Goldmann, MD;  Location: [facility];  Service: Ophthalmology;  Laterality: Left;  CDE  8.62   CORONARY ARTERY BYPASS GRAFT N/A 11/16/2021   Procedure: CORONARY ARTERY  BYPASS GRAFTING (CABG) times three using the left internal mammary and right saphenous vein.;  Surgeon: Gaye Pollack, MD;  Location: [facility];  Service: Open Heart Surgery;  Laterality: N/A;   IABP INSERTION N/A 11/16/2021   Procedure: IABP Insertion;  Surgeon: Leonie Man, MD;  Location: [place] [facility];  Service: Cardiovascular;  Laterality: N/A;   LEFT HEART CATH AND CORONARY ANGIOGRAPHY N/A 11/16/2021   Procedure: LEFT HEART CATH AND CORONARY ANGIOGRAPHY;  Surgeon: Leonie Man, MD;  Location: [facility];  Service: Cardiovascular;  Laterality: N/A;   REPLACEMENT TOTAL KNEE Right    SIMPLE MASTECTOMY WITH AXILLARY SENTINEL NODE BIOPSY Left 06/25/2022   Procedure: SIMPLE MASTECTOMY;  Surgeon: Aviva Signs, MD;  Location: [facility];  Service: General;  Laterality: Left;   TEE WITHOUT CARDIOVERSION N/A 11/16/2021   Procedure: TRANSESOPHAGEAL ECHOCARDIOGRAM (TEE);  Surgeon: Gaye Pollack, MD;  Location: [place];  Service: Open Heart Surgery;  Laterality: N/A;   TUBAL LIGATION      Family History  Problem Relation Age of Onset   COPD Mother    Alcohol abuse Father    Hypertension Sister    Heart attack Brother     Social History:  reports that she has been smoking cigarettes. She has a 20.00 pack-year smoking history. She has never used smokeless tobacco. She reports that she does not drink alcohol and does not use drugs.  Allergies:  Allergies  Allergen Reactions   Latex Rash  Medications: I have reviewed the patient's current medications. Prior to Admission:  Medications Prior to Admission  Medication Sig Dispense Refill Last Dose   anastrozole (ARIMIDEX) 1 MG tablet Take 1 tablet (1 mg total) by mouth daily. (Patient not taking: Reported on 06/24/2022) 90 tablet 1    Ascorbic Acid (VITAMIN C) 1000 MG tablet Take 1,000 mg by mouth daily.      aspirin EC 81 MG tablet Take 1 tablet (81 mg total) by mouth daily. Swallow whole. 30 tablet 12    cholecalciferol (VITAMIN D3)  25 MCG (1000 UNIT) tablet Take 1,000 Units by mouth daily.      clopidogrel (PLAVIX) 75 MG tablet TAKE 1 TABLET BY MOUTH EVERY DAY 90 tablet 1    cyclobenzaprine (FLEXERIL) 10 MG tablet Take 10 mg by mouth 2 (two) times daily as needed for muscle spasms.      DULoxetine (CYMBALTA) 60 MG capsule Take 60 mg by mouth daily.      ezetimibe (ZETIA) 10 MG tablet Take 10 mg by mouth daily.      furosemide (LASIX) 40 MG tablet TAKE 1 AND 1/2 TABLETS(60 MG) BY MOUTH DAILY 135 tablet 2    glimepiride (AMARYL) 4 MG tablet Take 4 mg by mouth daily with breakfast.      hydrOXYzine (ATARAX) 25 MG tablet Take 25 mg by mouth daily.      insulin glargine (LANTUS) 100 UNIT/ML injection Inject 40 Units into the skin at bedtime.      isosorbide mononitrate (IMDUR) 60 MG 24 hr tablet TAKE 1 TABLET(60 MG) BY MOUTH DAILY 90 tablet 2    losartan (COZAAR) 50 MG tablet Take 0.5 tablets (25 mg total) by mouth daily.      metFORMIN (GLUCOPHAGE) 500 MG tablet Take 500-1,000 mg by mouth See admin instructions. Take 2 tablets (1000 mg) in the morning and Take 1 tablets (500 mg) in the evening      metoprolol tartrate (LOPRESSOR) 25 MG tablet Take 1 tablet (25 mg total) by mouth 2 (two) times daily. 60 tablet 1    mometasone (ELOCON) 0.1 % ointment Apply 1 Application topically daily.      nitroGLYCERIN (NITROSTAT) 0.4 MG SL tablet Place 1 tablet (0.4 mg total) under the tongue every 5 (five) minutes x 3 doses as needed for chest pain (if no relief after 3rd dose, proceed to ED or call 911). 75 tablet 1    ondansetron (ZOFRAN) 4 MG tablet Take 4 mg by mouth every 8 (eight) hours as needed for nausea or vomiting.      oxyCODONE-acetaminophen (PERCOCET) 10-325 MG tablet Take 1 tablet by mouth every 4 (four) hours as needed for pain. 28 tablet 0    pregabalin (LYRICA) 75 MG capsule Take 75-150 mg by mouth See admin instructions. Take 2 tablets (150 mg) in the morning & Take 1 tablet (75 mg) at bedtime      rosuvastatin (CRESTOR) 10  MG tablet Take 10 mg by mouth daily.      triamcinolone ointment (KENALOG) 0.1 % Apply 1 Application topically daily as needed (For rash).      vitamin B-12 (CYANOCOBALAMIN) 1000 MCG tablet Take 1,000 mcg by mouth daily.       Results for orders placed or performed during the hospital encounter of 07/25/22 (from the past 48 hour(s))  Blood Culture (routine x 2)     Status: None (Preliminary result)   Collection Time: 07/25/22  5:09 PM   Specimen: Site Not Specified; Blood  Result Value Ref Range   Specimen Description      SITE NOT SPECIFIED BOTTLES DRAWN AEROBIC AND ANAEROBIC   Special Requests Blood Culture adequate volume    Culture      NO GROWTH < 12 HOURS Performed at [facility], [address], [place], [place] [address]    Report Status PENDING   Blood Culture (routine x 2)     Status: None (Preliminary result)   Collection Time: 07/25/22  5:14 PM   Specimen: BLOOD RIGHT ARM  Result Value Ref Range   Specimen Description      BLOOD RIGHT ARM BOTTLES DRAWN AEROBIC AND ANAEROBIC   Special Requests Blood Culture adequate volume    Culture      NO GROWTH < 12 HOURS Performed at [facility], [address] [place], [place] [address]    Report Status PENDING   Lactic acid, plasma     Status: None   Collection Time: 07/25/22  5:15 PM  Result Value Ref Range   Lactic Acid, Venous 1.4 0.5 - 1.9 mmol/L    Comment: Performed at [facility], [address] [place], [place] [address]  Comprehensive metabolic panel     Status: Abnormal   Collection Time: 07/25/22  5:15 PM  Result Value Ref Range   Sodium 130 (L) 135 - 145 mmol/L   Potassium 4.1 3.5 - 5.1 mmol/L   Chloride 97 (L) 98 - 111 mmol/L   CO2 25 22 - 32 mmol/L   Glucose, Bld 171 (H) 70 - 99 mg/dL    Comment: Glucose reference range applies only to samples taken after fasting for at least 8 hours.   BUN 13 8 - 23 mg/dL   Creatinine, Ser 1.11 (H) 0.44 - 1.00 mg/dL   Calcium 10.2 8.9 - 10.3 mg/dL   Total Protein 7.0  6.5 - 8.1 g/dL   Albumin 3.0 (L) 3.5 - 5.0 g/dL   AST 18 15 - 41 U/L   ALT 14 0 - 44 U/L   Alkaline Phosphatase 51 38 - 126 U/L   Total Bilirubin 0.7 0.3 - 1.2 mg/dL   GFR, Estimated 53 (L) >60 mL/min    Comment: (NOTE) Calculated using the CKD-EPI Creatinine Equation (2021)    Anion gap 8 5 - 15    Comment: Performed at [facility], [address], [place],  [address]  CBC with Differential     Status: Abnormal   Collection Time: 07/25/22  5:15 PM  Result Value Ref Range   WBC 6.6 4.0 - 10.5 K/uL   RBC 3.98 3.87 - 5.11 MIL/uL   Hemoglobin 12.3 12.0 - 15.0 g/dL   HCT 35.9 (L) 36.0 - 46.0 %   MCV 90.2 80.0 - 100.0 fL   MCH 30.9 26.0 - 34.0 pg   MCHC 34.3 30.0 - 36.0 g/dL   RDW 13.3 11.5 - 15.5 %   Platelets 208 150 - 400 K/uL   nRBC 0.0 0.0 - 0.2 %   Neutrophils Relative % 35 %   Neutro Abs 2.3 1.7 - 7.7 K/uL   Lymphocytes Relative 38 %   Lymphs Abs 2.5 0.7 - 4.0 K/uL   Monocytes Relative 26 %   Monocytes Absolute 1.7 (H) 0.1 - 1.0 K/uL   Eosinophils Relative 0 %   Eosinophils Absolute 0.0 0.0 - 0.5 K/uL   Basophils Relative 1 %   Basophils Absolute 0.0 0.0 - 0.1 K/uL   Immature Granulocytes 0 %   Abs Immature Granulocytes  0.02 0.00 - 0.07 K/uL    Comment: Performed at [facility], [address] [place], [place] [address]  Protime-INR     Status: None   Collection Time: 07/25/22  5:15 PM  Result Value Ref Range   Prothrombin Time 14.1 11.4 - 15.2 seconds   INR 1.1 0.8 - 1.2    Comment: (NOTE) INR goal varies based on device and disease states. Performed at [facility], [address], [place], [place] [address]   APTT     Status: None   Collection Time: 07/25/22  5:15 PM  Result Value Ref Range   aPTT 35 24 - 36 seconds    Comment: Performed at [name], [address], [place], [place] [address]  Aerobic Culture w Gram Stain (superficial specimen)     Status: None (Preliminary result)   Collection Time: 07/25/22  5:30 PM   Specimen: Breast  Result  Value Ref Range   Specimen Description      BREAST Performed at [facility], [address], [place], [place] [address]    Special Requests      NONE Performed at [facility], [address], [place], [place] [address]    Gram Stain      FEW WBC PRESENT,BOTH PMN AND MONONUCLEAR MODERATE GRAM POSITIVE COCCI IN CLUSTERS RARE GRAM VARIABLE ROD Performed at [facility] [address] [place], [place] [address]    Culture PENDING    Report Status PENDING   Troponin I (High Sensitivity)     Status: Abnormal   Collection Time: 07/25/22  6:44 PM  Result Value Ref Range   Troponin I (High Sensitivity) 43 (H) <18 ng/L    Comment: (NOTE) Elevated high sensitivity troponin I (hsTnI) values and significant  changes across serial measurements may suggest ACS but many other  chronic and acute conditions are known to elevate hsTnI results.  Refer to the "Links" section for chest pain algorithms and additional  guidance. Performed at [facility], [address], [place], [place] [address]   Lactic acid, plasma     Status: None   Collection Time: 07/25/22  6:45 PM  Result Value Ref Range   Lactic Acid, Venous 1.2 0.5 - 1.9 mmol/L    Comment: Performed at [facility], [address] [place], [place] [address]  Urinalysis, w/ Reflex to Culture (Infection Suspected) -Urine, Clean Catch     Status: Abnormal   Collection Time: 07/25/22  7:45 PM  Result Value Ref Range   Specimen Source URINE, CLEAN CATCH    Color, Urine STRAW (A) YELLOW   APPearance CLEAR CLEAR   Specific Gravity, Urine 1.018 1.005 - 1.030   pH 6.0 5.0 - 8.0   Glucose, UA NEGATIVE NEGATIVE mg/dL   Hgb urine dipstick NEGATIVE NEGATIVE   Bilirubin Urine NEGATIVE NEGATIVE   Ketones, ur NEGATIVE NEGATIVE mg/dL   Protein, ur NEGATIVE NEGATIVE mg/dL   Nitrite NEGATIVE NEGATIVE   Leukocytes,Ua TRACE (A) NEGATIVE   RBC / HPF 0-5 0 - 5 RBC/hpf   WBC, UA 0-5 0 - 5 WBC/hpf    Comment:        Reflex urine culture not  performed if WBC <=10, OR if Squamous epithelial cells >5. If Squamous epithelial cells >5 suggest recollection.    Bacteria, UA NONE SEEN NONE SEEN   Squamous Epithelial / HPF 0-5 0 - 5 /HPF    Comment: Performed at [facility], [address], [place], [place] [address]  Glucose, capillary  Status: Abnormal   Collection Time: 07/25/22  9:18 PM  Result Value Ref Range   Glucose-Capillary 165 (H) 70 - 99 mg/dL    Comment: Glucose reference range applies only to samples taken after fasting for at least 8 hours.  Comprehensive metabolic panel     Status: Abnormal   Collection Time: 07/26/22  4:58 AM  Result Value Ref Range   Sodium 131 (L) 135 - 145 mmol/L   Potassium 3.8 3.5 - 5.1 mmol/L   Chloride 98 98 - 111 mmol/L   CO2 24 22 - 32 mmol/L   Glucose, Bld 162 (H) 70 - 99 mg/dL    Comment: Glucose reference range applies only to samples taken after fasting for at least 8 hours.   BUN 11 8 - 23 mg/dL   Creatinine, Ser 1.06 (H) 0.44 - 1.00 mg/dL   Calcium 9.9 8.9 - 10.3 mg/dL   Total Protein 6.4 (L) 6.5 - 8.1 g/dL   Albumin 2.5 (L) 3.5 - 5.0 g/dL   AST 14 (L) 15 - 41 U/L   ALT 12 0 - 44 U/L   Alkaline Phosphatase 48 38 - 126 U/L   Total Bilirubin 0.6 0.3 - 1.2 mg/dL   GFR, Estimated 56 (L) >60 mL/min    Comment: (NOTE) Calculated using the CKD-EPI Creatinine Equation (2021)    Anion gap 9 5 - 15    Comment: Performed at [facility], [address], [place], [place] [address]  Magnesium     Status: Abnormal   Collection Time: 07/26/22  4:58 AM  Result Value Ref Range   Magnesium 1.5 (L) 1.7 - 2.4 mg/dL    Comment: Performed at [facility], [address] [place], [place] [address]  CBC with Differential/Platelet     Status: Abnormal   Collection Time: 07/26/22  4:58 AM  Result Value Ref Range   WBC 6.3 4.0 - 10.5 K/uL   RBC 3.83 (L) 3.87 - 5.11 MIL/uL   Hemoglobin 11.8 (L) 12.0 - 15.0 g/dL   HCT 34.3 (L) 36.0 - 46.0 %   MCV 89.6 80.0 - 100.0 fL   MCH 30.8 26.0 - 34.0  pg   MCHC 34.4 30.0 - 36.0 g/dL   RDW 13.3 11.5 - 15.5 %   Platelets 201 150 - 400 K/uL   nRBC 0.0 0.0 - 0.2 %   Neutrophils Relative % 34 %   Neutro Abs 2.1 1.7 - 7.7 K/uL   Lymphocytes Relative 35 %   Lymphs Abs 2.2 0.7 - 4.0 K/uL   Monocytes Relative 30 %   Monocytes Absolute 1.9 (H) 0.1 - 1.0 K/uL   Eosinophils Relative 0 %   Eosinophils Absolute 0.0 0.0 - 0.5 K/uL   Basophils Relative 1 %   Basophils Absolute 0.0 0.0 - 0.1 K/uL   Immature Granulocytes 0 %   Abs Immature Granulocytes 0.02 0.00 - 0.07 K/uL    Comment: Performed at [facility], [address], [place], [place] [address]  TSH     Status: None   Collection Time: 07/26/22  4:58 AM  Result Value Ref Range   TSH 1.040 0.350 - 4.500 uIU/mL    Comment: Performed by a 3rd Generation assay with a functional sensitivity of <=0.01 uIU/mL. Performed at [facility], [address], [place],  [address]   Glucose, capillary     Status: Abnormal   Collection Time: 07/26/22  7:09 AM  Result Value Ref Range   Glucose-Capillary 135 (H) 70 - 99  mg/dL    Comment: Glucose reference range applies only to samples taken after fasting for at least 8 hours.    CT Chest W Contrast  Result Date: 07/25/2022 CLINICAL DATA:  Status post left mastectomy on 2/5, now with infection, cellulitis versus abscess EXAM: CT CHEST WITH CONTRAST TECHNIQUE: Multidetector CT imaging of the chest was performed during intravenous contrast administration. RADIATION DOSE REDUCTION: This exam was performed according to the departmental dose-optimization program which includes automated exposure control, adjustment of the mA and/or kV according to patient size and/or use of iterative reconstruction technique. CONTRAST:  78m OMNIPAQUE IOHEXOL 300 MG/ML  SOLN COMPARISON:  None Available. FINDINGS: Cardiovascular: The heart is normal in size. No pericardial effusion. No evidence of thoracic aortic aneurysm. Atherosclerotic calcifications of the aortic arch.  Severe three-vessel coronary atherosclerosis. Postsurgical changes related to prior CABG. Mediastinum/Nodes: Small left axillary nodes measuring up to 8 mm short axis (series 2/images 30 and 52), warranting attention on follow-up. No suspicious mediastinal lymphadenopathy. Visualized thyroid is unremarkable. Lungs/Pleura: Mild centrilobular and paraseptal emphysematous changes, upper lung predominant. No suspicious pulmonary nodules. No focal consolidation. No pleural effusion or pneumothorax. Upper Abdomen: Visualized upper abdomen is grossly unremarkable, noting vascular calcifications. Musculoskeletal: Status post left mastectomy. Associated 5.5 x 14.4 x 7.9 cm fluid and gas collection in the surgical bed. While postoperative seroma is possible, given the clinical setting (approximally 1 month since initial surgery), this appearance is worrisome for postoperative infection/abscess. Overlying subcutaneous stranding with cutaneous thickening (series 2/image 87), suggesting associated cellulitis. Degenerative changes of the mid/lower thoracic spine. Median sternotomy. IMPRESSION: Status post left mastectomy. 5.5 x 14.4 x 7.9 cm fluid and gas collection in the surgical bed, worrisome for postoperative infection/abscess. Associated cellulitis. Small left axillary nodes measuring up to 8 mm short axis, possibly reactive, but warranting attention on follow-up. Aortic Atherosclerosis (ICD10-I70.0) and Emphysema (ICD10-J43.9). Electronically Signed   By: SJulian HyM.D.   On: 07/25/2022 18:09   DG Chest Port 1 View  Result Date: 07/25/2022 CLINICAL DATA:  Sepsis. EXAM: PORTABLE CHEST 1 VIEW COMPARISON:  Chest x-ray 12/06/2021 and older FINDINGS: Status post median sternotomy. Mediastinal clips. Overlapping cardiac leads. No consolidation, pneumothorax or effusion. No edema. Normal cardiopericardial silhouette. Eventration of the right hemidiaphragm. IMPRESSION: No acute cardiopulmonary disease Electronically  Signed   By: AJill SideM.D.   On: 07/25/2022 17:23    ROS:  Pertinent items are noted in HPI.  Blood pressure 118/69, pulse 73, temperature 99.9 F (37.7 C), temperature source Oral, resp. rate 19, height '5\' 4"'$  (1.626 m), weight 106.1 kg, SpO2 99 %. Physical Exam: Pleasant but fatigued black female in no acute distress Head is normocephalic, atraumatic Lungs clear to auscultation with equal breath sounds bilaterally Heart examination reveals a regular rate and rhythm without S3, S4, murmurs Left mastectomy site with wound breakdown along the mid portion of the incision line.  Serosanguineous drainage noted.  No erythema noted.  CT scan images personally reviewed  Assessment/Plan: Impression: Postoperative hematoma, status post left modified radical mastectomy Plan: Will take the patient back to the operating room to evacuate the hematoma.  The risks and benefits of the procedure were fully explained to the patient, who gave informed consent.  MAviva Signs3/08/2022, 7:31 AM

## 2022-07-26 NOTE — Transfer of Care (Signed)
Immediate Anesthesia Transfer of Care Note  Patient: Katelyn Kaufman  Procedure(s) Performed: EVACUATION OF HEMATOMA BREAST, DEBRIDEMENT OF LEFT BREAST WOUND (Left: Breast)  Patient Location: PACU  Anesthesia Type:General  Level of Consciousness: awake, alert , oriented, and patient cooperative  Airway & Oxygen Therapy: Patient Spontanous Breathing and Patient connected to face mask oxygen  Post-op Assessment: Report given to RN, Post -op Vital signs reviewed and stable, and Patient moving all extremities X 4  Post vital signs: Reviewed and stable  Last Vitals:  Vitals Value Taken Time  BP 136/61 07/26/22 1328  Temp    Pulse 79 07/26/22 1329  Resp 21 07/26/22 1330  SpO2 100 % 07/26/22 1329  Vitals shown include unvalidated device data.  Last Pain:  Vitals:   07/26/22 1135  TempSrc: Oral  PainSc: 7       Patients Stated Pain Goal: 4 ([id number])  Complications: No notable events documented.

## 2022-07-26 NOTE — Telephone Encounter (Signed)
New Message:      Lattie Haw from [employer] called. She want to know if patient have any allergies?

## 2022-07-26 NOTE — H&P (View-Only) (Signed)
Reason for Consult: Postoperative hematoma, status post left modified radical mastectomy Referring Physician: Dr. Jearl Klinefelter is an 72 y.o. female.  HPI: Patient is a 72 year old black female status post a left modified radical mastectomy on 06/25/2022 who presents back with a 3-day history of worsening swelling and serosanguineous drainage from the midportion of the wound.  She states this occurred suddenly.  She has had some fevers and feels weak.  She denies any purulent drainage from the wound.  Patient presented to the emergency room and a CT scan of the abdomen pelvis revealed a large fluid-filled area in the lateral portion of the mastectomy site.  White blood cell count within normal limits.  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   COPD (chronic obstructive pulmonary disease) ([place])    Depression    Essential hypertension    Hyperlipidemia    Myocardial infarction ([place])    [place] to the LAD   OSA (obstructive sleep apnea)    CPAP qHS   PONV (postoperative nausea and vomiting)    Type 2 diabetes mellitus ([place])     Past Surgical History:  Procedure Laterality Date   BACK SURGERY     BREAST SURGERY     CATARACT EXTRACTION W/PHACO Right 10/24/2020   Procedure: CATARACT EXTRACTION PHACO AND INTRAOCULAR LENS PLACEMENT (IOC);  Surgeon: Baruch Goldmann, MD;  Location: [facility];  Service: Ophthalmology;  Laterality: Right;  CDE: 10.87   CATARACT EXTRACTION W/PHACO Left 12/01/2020   Procedure: CATARACT EXTRACTION PHACO AND INTRAOCULAR LENS PLACEMENT LEFT EYE;  Surgeon: Baruch Goldmann, MD;  Location: [facility];  Service: Ophthalmology;  Laterality: Left;  CDE  8.62   CORONARY ARTERY BYPASS GRAFT N/A 11/16/2021   Procedure: CORONARY ARTERY  BYPASS GRAFTING (CABG) times three using the left internal mammary and right saphenous vein.;  Surgeon: Gaye Pollack, MD;  Location[facility];  Service: Open Heart Surgery;  Laterality: N/A;   IABP INSERTION N/A 11/16/2021   Procedure: IABP Insertion;  Surgeon: Leonie Man, MD;  Location: [place];  Service: Cardiovascular;  Laterality: N/A;   LEFT HEART CATH AND CORONARY ANGIOGRAPHY N/A 11/16/2021   Procedure: LEFT HEART CATH AND CORONARY ANGIOGRAPHY;  Surgeon: Leonie Man, MD;  Location: [facility] [place] [facility];  Service: Cardiovascular;  Laterality: N/A;   REPLACEMENT TOTAL KNEE Right    SIMPLE MASTECTOMY WITH AXILLARY SENTINEL NODE BIOPSY Left 06/25/2022   Procedure: SIMPLE MASTECTOMY;  Surgeon: Aviva Signs, MD;  Location: [facility];  Service: General;  Laterality: Left;   TEE WITHOUT CARDIOVERSION N/A 11/16/2021   Procedure: TRANSESOPHAGEAL ECHOCARDIOGRAM (TEE);  Surgeon: Gaye Pollack, MD;  Location: [facility];  Service: Open Heart Surgery;  Laterality: N/A;   TUBAL LIGATION      Family History  Problem Relation Age of Onset   COPD Mother    Alcohol abuse Father    Hypertension Sister    Heart attack Brother     Social History:  reports that she has been smoking cigarettes. She has a 20.00 pack-year smoking history. She has never used smokeless tobacco. She reports that she does not drink alcohol and does not use drugs.  Allergies:  Allergies  Allergen Reactions   Latex Rash  Medications: I have reviewed the patient's current medications. Prior to Admission:  Medications Prior to Admission  Medication Sig Dispense Refill Last Dose   anastrozole (ARIMIDEX) 1 MG tablet Take 1 tablet (1 mg total) by mouth daily. (Patient not taking: Reported on 06/24/2022) 90 tablet 1    Ascorbic Acid (VITAMIN C) 1000 MG tablet Take 1,000 mg by mouth daily.      aspirin EC 81 MG tablet Take 1 tablet (81 mg total) by mouth daily. Swallow whole. 30 tablet 12    cholecalciferol (VITAMIN D3)  25 MCG (1000 UNIT) tablet Take 1,000 Units by mouth daily.      clopidogrel (PLAVIX) 75 MG tablet TAKE 1 TABLET BY MOUTH EVERY DAY 90 tablet 1    cyclobenzaprine (FLEXERIL) 10 MG tablet Take 10 mg by mouth 2 (two) times daily as needed for muscle spasms.      DULoxetine (CYMBALTA) 60 MG capsule Take 60 mg by mouth daily.      ezetimibe (ZETIA) 10 MG tablet Take 10 mg by mouth daily.      furosemide (LASIX) 40 MG tablet TAKE 1 AND 1/2 TABLETS(60 MG) BY MOUTH DAILY 135 tablet 2    glimepiride (AMARYL) 4 MG tablet Take 4 mg by mouth daily with breakfast.      hydrOXYzine (ATARAX) 25 MG tablet Take 25 mg by mouth daily.      insulin glargine (LANTUS) 100 UNIT/ML injection Inject 40 Units into the skin at bedtime.      isosorbide mononitrate (IMDUR) 60 MG 24 hr tablet TAKE 1 TABLET(60 MG) BY MOUTH DAILY 90 tablet 2    losartan (COZAAR) 50 MG tablet Take 0.5 tablets (25 mg total) by mouth daily.      metFORMIN (GLUCOPHAGE) 500 MG tablet Take 500-1,000 mg by mouth See admin instructions. Take 2 tablets (1000 mg) in the morning and Take 1 tablets (500 mg) in the evening      metoprolol tartrate (LOPRESSOR) 25 MG tablet Take 1 tablet (25 mg total) by mouth 2 (two) times daily. 60 tablet 1    mometasone (ELOCON) 0.1 % ointment Apply 1 Application topically daily.      nitroGLYCERIN (NITROSTAT) 0.4 MG SL tablet Place 1 tablet (0.4 mg total) under the tongue every 5 (five) minutes x 3 doses as needed for chest pain (if no relief after 3rd dose, proceed to ED or call 911). 75 tablet 1    ondansetron (ZOFRAN) 4 MG tablet Take 4 mg by mouth every 8 (eight) hours as needed for nausea or vomiting.      oxyCODONE-acetaminophen (PERCOCET) 10-325 MG tablet Take 1 tablet by mouth every 4 (four) hours as needed for pain. 28 tablet 0    pregabalin (LYRICA) 75 MG capsule Take 75-150 mg by mouth See admin instructions. Take 2 tablets (150 mg) in the morning & Take 1 tablet (75 mg) at bedtime      rosuvastatin (CRESTOR) 10  MG tablet Take 10 mg by mouth daily.      triamcinolone ointment (KENALOG) 0.1 % Apply 1 Application topically daily as needed (For rash).      vitamin B-12 (CYANOCOBALAMIN) 1000 MCG tablet Take 1,000 mcg by mouth daily.       Results for orders placed or performed during the hospital encounter of 07/25/22 (from the past 48 hour(s))  Blood Culture (routine x 2)     Status: None (Preliminary result)   Collection Time: 07/25/22  5:09 PM   Specimen: Site Not Specified; Blood  Result Value Ref Range   Specimen Description      SITE NOT SPECIFIED BOTTLES DRAWN AEROBIC AND ANAEROBIC   Special Requests Blood Culture adequate volume    Culture      NO GROWTH < 12 HOURS Performed at [facility], [address], [place], [place] [address]    Report Status PENDING   Blood Culture (routine x 2)     Status: None (Preliminary result)   Collection Time: 07/25/22  5:14 PM   Specimen: BLOOD RIGHT ARM  Result Value Ref Range   Specimen Description      BLOOD RIGHT ARM BOTTLES DRAWN AEROBIC AND ANAEROBIC   Special Requests Blood Culture adequate volume    Culture      NO GROWTH < 12 HOURS Performed at [facility], [address] [place], [place] [address]    Report Status PENDING   Lactic acid, plasma     Status: None   Collection Time: 07/25/22  5:15 PM  Result Value Ref Range   Lactic Acid, Venous 1.4 0.5 - 1.9 mmol/L    Comment: Performed at [facility], [address], [place], [place] [address]  Comprehensive metabolic panel     Status: Abnormal   Collection Time: 07/25/22  5:15 PM  Result Value Ref Range   Sodium 130 (L) 135 - 145 mmol/L   Potassium 4.1 3.5 - 5.1 mmol/L   Chloride 97 (L) 98 - 111 mmol/L   CO2 25 22 - 32 mmol/L   Glucose, Bld 171 (H) 70 - 99 mg/dL    Comment: Glucose reference range applies only to samples taken after fasting for at least 8 hours.   BUN 13 8 - 23 mg/dL   Creatinine, Ser 1.11 (H) 0.44 - 1.00 mg/dL   Calcium 10.2 8.9 - 10.3 mg/dL   Total Protein 7.0  6.5 - 8.1 g/dL   Albumin 3.0 (L) 3.5 - 5.0 g/dL   AST 18 15 - 41 U/L   ALT 14 0 - 44 U/L   Alkaline Phosphatase 51 38 - 126 U/L   Total Bilirubin 0.7 0.3 - 1.2 mg/dL   GFR, Estimated 53 (L) >60 mL/min    Comment: (NOTE) Calculated using the CKD-EPI Creatinine Equation (2021)    Anion gap 8 5 - 15    Comment: Performed at [facility], [address], [place], [place] [address]  CBC with Differential     Status: Abnormal   Collection Time: 07/25/22  5:15 PM  Result Value Ref Range   WBC 6.6 4.0 - 10.5 K/uL   RBC 3.98 3.87 - 5.11 MIL/uL   Hemoglobin 12.3 12.0 - 15.0 g/dL   HCT 35.9 (L) 36.0 - 46.0 %   MCV 90.2 80.0 - 100.0 fL   MCH 30.9 26.0 - 34.0 pg   MCHC 34.3 30.0 - 36.0 g/dL   RDW 13.3 11.5 - 15.5 %   Platelets 208 150 - 400 K/uL   nRBC 0.0 0.0 - 0.2 %   Neutrophils Relative % 35 %   Neutro Abs 2.3 1.7 - 7.7 K/uL   Lymphocytes Relative 38 %   Lymphs Abs 2.5 0.7 - 4.0 K/uL   Monocytes Relative 26 %   Monocytes Absolute 1.7 (H) 0.1 - 1.0 K/uL   Eosinophils Relative 0 %   Eosinophils Absolute 0.0 0.0 - 0.5 K/uL   Basophils Relative 1 %   Basophils Absolute 0.0 0.0 - 0.1 K/uL   Immature Granulocytes 0 %   Abs Immature Granulocytes  0.02 0.00 - 0.07 K/uL    Comment: Performed at [facility], [address], [place], [place] [address]  Protime-INR     Status: None   Collection Time: 07/25/22  5:15 PM  Result Value Ref Range   Prothrombin Time 14.1 11.4 - 15.2 seconds   INR 1.1 0.8 - 1.2    Comment: (NOTE) INR goal varies based on device and disease states. Performed at [facility], [address] [place], [place] [address]   APTT     Status: None   Collection Time: 07/25/22  5:15 PM  Result Value Ref Range   aPTT 35 24 - 36 seconds    Comment: Performed at [facility], [address], [place], [place] [address]  Aerobic Culture w Gram Stain (superficial specimen)     Status: None (Preliminary result)   Collection Time: 07/25/22  5:30 PM   Specimen: Breast  Result  Value Ref Range   Specimen Description      BREAST Performed at [facility], [address] [place], [place] [address]    Special Requests      NONE Performed at [facility], [address], [place], [place] [address]    Gram Stain      FEW WBC PRESENT,BOTH PMN AND MONONUCLEAR MODERATE GRAM POSITIVE COCCI IN CLUSTERS RARE GRAM VARIABLE ROD Performed at [facility] [address] [place], [place] [address]    Culture PENDING    Report Status PENDING   Troponin I (High Sensitivity)     Status: Abnormal   Collection Time: 07/25/22  6:44 PM  Result Value Ref Range   Troponin I (High Sensitivity) 43 (H) <18 ng/L    Comment: (NOTE) Elevated high sensitivity troponin I (hsTnI) values and significant  changes across serial measurements may suggest ACS but many other  chronic and acute conditions are known to elevate hsTnI results.  Refer to the "Links" section for chest pain algorithms and additional  guidance. Performed at [facility], [address], [place], [place] [address]   Lactic acid, plasma     Status: None   Collection Time: 07/25/22  6:45 PM  Result Value Ref Range   Lactic Acid, Venous 1.2 0.5 - 1.9 mmol/L    Comment: Performed at [facility], [address] [place], [place] [address]  Urinalysis, w/ Reflex to Culture (Infection Suspected) -Urine, Clean Catch     Status: Abnormal   Collection Time: 07/25/22  7:45 PM  Result Value Ref Range   Specimen Source URINE, CLEAN CATCH    Color, Urine STRAW (A) YELLOW   APPearance CLEAR CLEAR   Specific Gravity, Urine 1.018 1.005 - 1.030   pH 6.0 5.0 - 8.0   Glucose, UA NEGATIVE NEGATIVE mg/dL   Hgb urine dipstick NEGATIVE NEGATIVE   Bilirubin Urine NEGATIVE NEGATIVE   Ketones, ur NEGATIVE NEGATIVE mg/dL   Protein, ur NEGATIVE NEGATIVE mg/dL   Nitrite NEGATIVE NEGATIVE   Leukocytes,Ua TRACE (A) NEGATIVE   RBC / HPF 0-5 0 - 5 RBC/hpf   WBC, UA 0-5 0 - 5 WBC/hpf    Comment:        Reflex urine culture not  performed if WBC <=10, OR if Squamous epithelial cells >5. If Squamous epithelial cells >5 suggest recollection.    Bacteria, UA NONE SEEN NONE SEEN   Squamous Epithelial / HPF 0-5 0 - 5 /HPF    Comment: Performed at [facility], [address] [place],  [address]  Glucose, capillary  Status: Abnormal   Collection Time: 07/25/22  9:18 PM  Result Value Ref Range   Glucose-Capillary 165 (H) 70 - 99 mg/dL    Comment: Glucose reference range applies only to samples taken after fasting for at least 8 hours.  Comprehensive metabolic panel     Status: Abnormal   Collection Time: 07/26/22  4:58 AM  Result Value Ref Range   Sodium 131 (L) 135 - 145 mmol/L   Potassium 3.8 3.5 - 5.1 mmol/L   Chloride 98 98 - 111 mmol/L   CO2 24 22 - 32 mmol/L   Glucose, Bld 162 (H) 70 - 99 mg/dL    Comment: Glucose reference range applies only to samples taken after fasting for at least 8 hours.   BUN 11 8 - 23 mg/dL   Creatinine, Ser 1.06 (H) 0.44 - 1.00 mg/dL   Calcium 9.9 8.9 - 10.3 mg/dL   Total Protein 6.4 (L) 6.5 - 8.1 g/dL   Albumin 2.5 (L) 3.5 - 5.0 g/dL   AST 14 (L) 15 - 41 U/L   ALT 12 0 - 44 U/L   Alkaline Phosphatase 48 38 - 126 U/L   Total Bilirubin 0.6 0.3 - 1.2 mg/dL   GFR, Estimated 56 (L) >60 mL/min    Comment: (NOTE) Calculated using the CKD-EPI Creatinine Equation (2021)    Anion gap 9 5 - 15    Comment: Performed at [facility], [address] [place], [place] [address]  Magnesium     Status: Abnormal   Collection Time: 07/26/22  4:58 AM  Result Value Ref Range   Magnesium 1.5 (L) 1.7 - 2.4 mg/dL    Comment: Performed at [facility], [address], [place], [place] [address]  CBC with Differential/Platelet     Status: Abnormal   Collection Time: 07/26/22  4:58 AM  Result Value Ref Range   WBC 6.3 4.0 - 10.5 K/uL   RBC 3.83 (L) 3.87 - 5.11 MIL/uL   Hemoglobin 11.8 (L) 12.0 - 15.0 g/dL   HCT 34.3 (L) 36.0 - 46.0 %   MCV 89.6 80.0 - 100.0 fL   MCH 30.8 26.0 - 34.0  pg   MCHC 34.4 30.0 - 36.0 g/dL   RDW 13.3 11.5 - 15.5 %   Platelets 201 150 - 400 K/uL   nRBC 0.0 0.0 - 0.2 %   Neutrophils Relative % 34 %   Neutro Abs 2.1 1.7 - 7.7 K/uL   Lymphocytes Relative 35 %   Lymphs Abs 2.2 0.7 - 4.0 K/uL   Monocytes Relative 30 %   Monocytes Absolute 1.9 (H) 0.1 - 1.0 K/uL   Eosinophils Relative 0 %   Eosinophils Absolute 0.0 0.0 - 0.5 K/uL   Basophils Relative 1 %   Basophils Absolute 0.0 0.0 - 0.1 K/uL   Immature Granulocytes 0 %   Abs Immature Granulocytes 0.02 0.00 - 0.07 K/uL    Comment: Performed at [facility], [address], [place], [place] [address]  TSH     Status: None   Collection Time: 07/26/22  4:58 AM  Result Value Ref Range   TSH 1.040 0.350 - 4.500 uIU/mL    Comment: Performed by a 3rd Generation assay with a functional sensitivity of <=0.01 uIU/mL. Performed at [facility], [address], [place],  [address]   Glucose, capillary     Status: Abnormal   Collection Time: 07/26/22  7:09 AM  Result Value Ref Range   Glucose-Capillary 135 (H) 70 - 99  mg/dL    Comment: Glucose reference range applies only to samples taken after fasting for at least 8 hours.    CT Chest W Contrast  Result Date: 07/25/2022 CLINICAL DATA:  Status post left mastectomy on 2/5, now with infection, cellulitis versus abscess EXAM: CT CHEST WITH CONTRAST TECHNIQUE: Multidetector CT imaging of the chest was performed during intravenous contrast administration. RADIATION DOSE REDUCTION: This exam was performed according to the departmental dose-optimization program which includes automated exposure control, adjustment of the mA and/or kV according to patient size and/or use of iterative reconstruction technique. CONTRAST:  25m OMNIPAQUE IOHEXOL 300 MG/ML  SOLN COMPARISON:  None Available. FINDINGS: Cardiovascular: The heart is normal in size. No pericardial effusion. No evidence of thoracic aortic aneurysm. Atherosclerotic calcifications of the aortic arch.  Severe three-vessel coronary atherosclerosis. Postsurgical changes related to prior CABG. Mediastinum/Nodes: Small left axillary nodes measuring up to 8 mm short axis (series 2/images 30 and 52), warranting attention on follow-up. No suspicious mediastinal lymphadenopathy. Visualized thyroid is unremarkable. Lungs/Pleura: Mild centrilobular and paraseptal emphysematous changes, upper lung predominant. No suspicious pulmonary nodules. No focal consolidation. No pleural effusion or pneumothorax. Upper Abdomen: Visualized upper abdomen is grossly unremarkable, noting vascular calcifications. Musculoskeletal: Status post left mastectomy. Associated 5.5 x 14.4 x 7.9 cm fluid and gas collection in the surgical bed. While postoperative seroma is possible, given the clinical setting (approximally 1 month since initial surgery), this appearance is worrisome for postoperative infection/abscess. Overlying subcutaneous stranding with cutaneous thickening (series 2/image 87), suggesting associated cellulitis. Degenerative changes of the mid/lower thoracic spine. Median sternotomy. IMPRESSION: Status post left mastectomy. 5.5 x 14.4 x 7.9 cm fluid and gas collection in the surgical bed, worrisome for postoperative infection/abscess. Associated cellulitis. Small left axillary nodes measuring up to 8 mm short axis, possibly reactive, but warranting attention on follow-up. Aortic Atherosclerosis (ICD10-I70.0) and Emphysema (ICD10-J43.9). Electronically Signed   By: SJulian HyM.D.   On: 07/25/2022 18:09   DG Chest Port 1 View  Result Date: 07/25/2022 CLINICAL DATA:  Sepsis. EXAM: PORTABLE CHEST 1 VIEW COMPARISON:  Chest x-ray 12/06/2021 and older FINDINGS: Status post median sternotomy. Mediastinal clips. Overlapping cardiac leads. No consolidation, pneumothorax or effusion. No edema. Normal cardiopericardial silhouette. Eventration of the right hemidiaphragm. IMPRESSION: No acute cardiopulmonary disease Electronically  Signed   By: AJill SideM.D.   On: 07/25/2022 17:23    ROS:  Pertinent items are noted in HPI.  Blood pressure 118/69, pulse 73, temperature 99.9 F (37.7 C), temperature source Oral, resp. rate 19, height '5\' 4"'$  (1.626 m), weight 106.1 kg, SpO2 99 %. Physical Exam: Pleasant but fatigued black female in no acute distress Head is normocephalic, atraumatic Lungs clear to auscultation with equal breath sounds bilaterally Heart examination reveals a regular rate and rhythm without S3, S4, murmurs Left mastectomy site with wound breakdown along the mid portion of the incision line.  Serosanguineous drainage noted.  No erythema noted.  CT scan images personally reviewed  Assessment/Plan: Impression: Postoperative hematoma, status post left modified radical mastectomy Plan: Will take the patient back to the operating room to evacuate the hematoma.  The risks and benefits of the procedure were fully explained to the patient, who gave informed consent.  MAviva Signs3/08/2022, 7:31 AM

## 2022-07-26 NOTE — Progress Notes (Signed)
Progress Note   Patient: Katelyn Kaufman [id number] DOB: 1951/01/19 DOA: 07/25/2022     0 DOS: the patient was seen and examined on 07/26/2022   Brief hospital course: As per H&P written by Dr.Zierle-Ghosh on 07/25/22  Katelyn Kaufman is a 72 y.o. female with medical history significant of anxiety, coronary artery disease, COPD, depression, hypertension, hyperlipidemia, OSA, type 2 diabetes mellitus, presents the ED with a chief complaint of left breast pain.  Patient reports that she had a mastectomy on June 25, 2022 due to breast cancer.  She has had pain since the surgery but it got acutely worse last night.  She reports she has had golden colored drainage and large amounts since the staples were taken out.  She developed a fever 2 days ago.  She reports a measured temperature of 99.9 but feeling feverish with chills.  She has had some nausea and vomiting for the last 4-5 days.  Any solid food she eats comes right back up.  She had no appetite.  She denies any hematemesis.  She does have some periumbilical abdominal pain that is crampy.  It is worse when she is heaving.  Patient reports that she has infrequent bowel movements at baseline but her last bowel movement was 2 days ago.  Patient has noticed no erythema at the surgical site.  She reports she has not been on any antibiotics recently.  On review of systems she reports some shortness of breath that is chronic.  She is not on any oxygen supplementation at home.  She has had some palpitations and chest pain yesterday.  She took a nitro and they were resolved.  She reports that the palpitations and chest pain were associated with nausea, diaphoresis, dizziness.  The dizziness has been 1 week intermittent.  She has no other complaints at this time.   Patient does smoke.  She request nicotine patch.  Patient does not drink alcohol and does not use illicit drugs.  She is vaccinated for COVID.  Patient is full code.   Assessment and Plan: *  Abscess after procedure -Seen on CT chest at left mastectomy surgical site - Abscess size 5.5 x 14.4 x 7.9 cm - General surgery consulted and with plans to take patient to the OR for debridement and deep cultures. -Continue IV antibiotics, adequate hydration, as needed analgesics and supportive care.  Thyromegaly -TSH within normal limits. -Continue outpatient follow-up.  Tobacco use disorder - Smokes half a pack per day - Cessation counseling provided -Continue nicotine patch.  S/P left mastectomy - Continue to follow general surgery recommendations and postoperative management.  Coronary artery disease - Continue aspirin, losartan, Imdur, Crestor - Currently not demonstrating any complaints that would suggest ACS.  Hyperlipidemia - Continue Zetia and Crestor  Hypertension -Continue losartan -Blood pressure stable overall.  Type 2 diabetes mellitus with complication, with long-term current use of insulin (HCC) - Continue modified carbohydrate diet -Follow CBGs fluctuation and adjust hypoglycemic regimen as needed -Continue sliding scale insulin and long acting.  Morbid obesity: -Body mass index is 40.11 kg/m. -Low-calorie diet, portion control and increase physical activity discussed with patient.  Subjective:  Afebrile, no chest pain, no nausea, no vomiting, good saturation on room air.  Expressing some left-sided chest discomfort.  No acute distress.  Physical Exam: Vitals:   07/26/22 1415 07/26/22 1417 07/26/22 1423 07/26/22 1433  BP:   (!) 119/57 (!) 119/54  Pulse: 75  79 76  Resp: (!) '25  19 18  '$ Temp:  98.3 F (36.8 C)  98.5 F (36.9 C)  TempSrc:    Oral  SpO2: 97%  100% 100%  Weight:      Height:       General exam: Alert, awake, oriented x 3; currently afebrile; low-grade temperature appreciated overnight.  No nausea, no vomiting, good saturation on room air. Respiratory system: Clear to auscultation. Respiratory effort normal.  No using accessory  muscles. Cardiovascular system:RRR. No rubs or Gastrointestinal system: Abdomen is obese, nondistended, soft and nontender. No organomegaly or masses felt. Normal bowel sounds heard. Central nervous system: Alert and oriented. No focal neurological deficits. Extremities: No cyanosis or clubbing. Skin: No petechiae; left chest with left mastectomy site wound breakdown along the midportion of the incision line with serosanguineous drainage appreciated.  Patient expressing tenderness around wound. Psychiatry: Judgement and insight appear normal. Mood & affect appropriate.   Data Reviewed: Comprehensive metabolic panel: Sodium [id number], potassium 3.8, chloride 98, bicarb 24, BUN 11, creatinine 1.06 stable/normal LFTs and a GFR of 56 -CBGs: 99 >>135>> 162 CBC: WBC 6.3 hemoglobin 11.8 and platelet count 201K TSH: 1.040  Family Communication: No family at bedside.  Disposition: Status is: Inpatient Remains inpatient appropriate because: Continue treatment with IV antibiotics and follow recommendations by general surgery.   Planned Discharge Destination: Home  Time spent: 30 minutes  Author: Barton Dubois, MD 07/26/2022 4:11 PM  For on call review [email or web address].

## 2022-07-26 NOTE — Anesthesia Procedure Notes (Signed)
Procedure Name: Intubation Date/Time: 07/26/2022 12:36 PM  Performed by: Annamary Carolin, CRNAPre-anesthesia Checklist: Patient identified, Emergency Drugs available, Suction available and Patient being monitored Patient Re-evaluated:Patient Re-evaluated prior to induction Oxygen Delivery Method: Circle System Utilized Preoxygenation: Pre-oxygenation with 100% oxygen Induction Type: IV induction, Rapid sequence and Cricoid Pressure applied Laryngoscope Size: Glidescope and 3 Grade View: Grade I Tube type: Oral Number of attempts: 1 Airway Equipment and Method: Stylet, Bite block and Video-laryngoscopy Placement Confirmation: ETT inserted through vocal cords under direct vision, positive ETCO2 and breath sounds checked- equal and bilateral Tube secured with: Tape Dental Injury: Teeth and Oropharynx as per pre-operative assessment  Comments: With ease

## 2022-07-27 ENCOUNTER — Ambulatory Visit: Payer: Medicare HMO | Admitting: Hematology

## 2022-07-27 DIAGNOSIS — F17200 Nicotine dependence, unspecified, uncomplicated: Secondary | ICD-10-CM | POA: Diagnosis not present

## 2022-07-27 DIAGNOSIS — T8149XA Infection following a procedure, other surgical site, initial encounter: Secondary | ICD-10-CM | POA: Diagnosis not present

## 2022-07-27 DIAGNOSIS — E118 Type 2 diabetes mellitus with unspecified complications: Secondary | ICD-10-CM | POA: Diagnosis not present

## 2022-07-27 DIAGNOSIS — I10 Essential (primary) hypertension: Secondary | ICD-10-CM | POA: Diagnosis not present

## 2022-07-27 LAB — CBC
HCT: 30.8 % — ABNORMAL LOW (ref 36.0–46.0)
Hemoglobin: 10.3 g/dL — ABNORMAL LOW (ref 12.0–15.0)
MCH: 30.6 pg (ref 26.0–34.0)
MCHC: 33.4 g/dL (ref 30.0–36.0)
MCV: 91.4 fL (ref 80.0–100.0)
Platelets: 207 10*3/uL (ref 150–400)
RBC: 3.37 MIL/uL — ABNORMAL LOW (ref 3.87–5.11)
RDW: 13.5 % (ref 11.5–15.5)
WBC: 7.5 10*3/uL (ref 4.0–10.5)
nRBC: 0 % (ref 0.0–0.2)

## 2022-07-27 LAB — BASIC METABOLIC PANEL
Anion gap: 7 (ref 5–15)
BUN: 13 mg/dL (ref 8–23)
CO2: 26 mmol/L (ref 22–32)
Calcium: 10 mg/dL (ref 8.9–10.3)
Chloride: 99 mmol/L (ref 98–111)
Creatinine, Ser: 1.14 mg/dL — ABNORMAL HIGH (ref 0.44–1.00)
GFR, Estimated: 51 mL/min — ABNORMAL LOW (ref >60)
Glucose, Bld: 165 mg/dL — ABNORMAL HIGH (ref 70–99)
Potassium: 3.6 mmol/L (ref 3.5–5.1)
Sodium: 132 mmol/L — ABNORMAL LOW (ref 135–145)

## 2022-07-27 LAB — GLUCOSE, CAPILLARY
Glucose-Capillary: 140 mg/dL — ABNORMAL HIGH (ref 70–99)
Glucose-Capillary: 183 mg/dL — ABNORMAL HIGH (ref 70–99)
Glucose-Capillary: 184 mg/dL — ABNORMAL HIGH (ref 70–99)
Glucose-Capillary: 192 mg/dL — ABNORMAL HIGH (ref 70–99)

## 2022-07-27 MED ORDER — ENSURE ENLIVE PO LIQD
237.0000 mL | Freq: Three times a day (TID) | ORAL | Status: DC
  Administered 2022-07-27 – 2022-07-31 (×10): 237 mL via ORAL

## 2022-07-27 MED ORDER — VITAMIN C 500 MG PO TABS
500.0000 mg | ORAL_TABLET | Freq: Every day | ORAL | Status: DC
  Administered 2022-07-27 – 2022-07-31 (×5): 500 mg via ORAL
  Filled 2022-07-27 (×5): qty 1

## 2022-07-27 MED ORDER — GLIMEPIRIDE 2 MG PO TABS
4.0000 mg | ORAL_TABLET | Freq: Every day | ORAL | Status: DC
  Administered 2022-07-28: 4 mg via ORAL
  Filled 2022-07-27: qty 2

## 2022-07-27 MED ORDER — GLUCERNA SHAKE PO LIQD
237.0000 mL | ORAL | Status: DC
  Administered 2022-07-27 – 2022-07-30 (×4): 237 mL via ORAL

## 2022-07-27 MED ORDER — ISOSORBIDE MONONITRATE ER 60 MG PO TB24
30.0000 mg | ORAL_TABLET | Freq: Every day | ORAL | Status: DC
  Administered 2022-07-28 – 2022-07-31 (×4): 30 mg via ORAL
  Filled 2022-07-27 (×4): qty 1

## 2022-07-27 MED ORDER — SODIUM CHLORIDE 0.9 % IV SOLN
2.0000 g | INTRAVENOUS | Status: DC
  Administered 2022-07-27: 2 g via INTRAVENOUS
  Filled 2022-07-27 (×2): qty 20

## 2022-07-27 NOTE — Plan of Care (Signed)
  Problem: Acute Rehab PT Goals(only PT should resolve) Goal: Pt Will Go Supine/Side To Sit Outcome: Progressing Flowsheets (Taken 07/27/2022 1536) Pt will go Supine/Side to Sit:  with supervision  with modified independence Goal: Patient Will Transfer Sit To/From Stand Outcome: Progressing Flowsheets (Taken 07/27/2022 1536) Patient will transfer sit to/from stand:  with supervision  with min guard assist Goal: Pt Will Transfer Bed To Chair/Chair To Bed Outcome: Progressing Flowsheets (Taken 07/27/2022 1536) Pt will Transfer Bed to Chair/Chair to Bed: with supervision Goal: Pt Will Ambulate Outcome: Progressing Flowsheets (Taken 07/27/2022 1536) Pt will Ambulate:  50 feet  with minimal assist  with min guard assist  with rolling walker   3:36 PM, 07/27/22 Lonell Grandchild, MPT Physical Therapist with [facility] [phone] office [phone] mobile phone

## 2022-07-27 NOTE — NC FL2 (Signed)
[place] LEVEL OF CARE FORM     IDENTIFICATION  Patient Name: Katelyn Kaufman Birthdate: 11/27/1950 Sex: female Admission Date (Current Location): 07/25/2022  [facility] and [place] Number:  [employer] and Address:  [place] [address] [place]      Provider Number: [id number]  Attending Physician Name and Address:  Aviva Signs, MD  Relative Name and Phone Number:       Current Level of Care: Hospital Recommended Level of Care: [place] Prior Approval Number:    Date Approved/Denied:   PASRR Number:    Discharge Plan: SNF    Current Diagnoses: Patient Active Problem List   Diagnosis Date Noted   Hematoma of left breast 07/26/2022   Abscess after procedure 07/25/2022   Tobacco use disorder 07/25/2022   Thyromegaly 07/25/2022   S/P left mastectomy 06/25/2022   Breast cancer of upper-outer quadrant of left female breast ([place]) 12/10/2021   Acute coronary syndrome ([place])    Type 2 diabetes mellitus with complication, with long-term current use of insulin ([place]) 11/16/2021   Hypertension 11/16/2021   Hyperlipidemia 11/16/2021   Morbid obesity ([place]) 11/16/2021   Acute myocardial infarction involving left main coronary artery ([place]) 11/16/2021   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   Coronary artery disease 11/16/2021   Non-ST elevation (NSTEMI) myocardial infarction ([place])     Orientation RESPIRATION BLADDER Height & Weight     Self, Time, Situation, Place  Normal Continent Weight: 233 lb 11 oz (106 kg) Height:  '5\' 4"'$  (162.6 cm)  BEHAVIORAL SYMPTOMS/MOOD NEUROLOGICAL BOWEL NUTRITION STATUS      Continent Diet (see dc summary)  AMBULATORY STATUS COMMUNICATION OF NEEDS Skin   Extensive Assist Verbally Surgical wounds, Wound Vac                       Personal Care Assistance Level of Assistance  Bathing, Feeding, Dressing Bathing Assistance: Limited assistance Feeding assistance: Independent Dressing  Assistance: Limited assistance     Functional Limitations Info  Sight, Hearing, Speech Sight Info: Adequate Hearing Info: Adequate Speech Info: Adequate    SPECIAL CARE FACTORS FREQUENCY  PT (By licensed PT), OT (By licensed OT)     PT Frequency: 5x week OT Frequency: 5x week            Contractures Contractures Info: Not present    Additional Factors Info  Code Status, Allergies, Psychotropic Code Status Info: Full Allergies Info: Latex Psychotropic Info: Cymbalta         Current Medications (07/27/2022):  This is the current hospital active medication list Current Facility-Administered Medications  Medication Dose Route Frequency Provider Last Rate Last Admin   0.9 %  sodium chloride infusion   Intravenous Continuous Aviva Signs, MD 10 mL/hr at 07/27/22 1503 Infusion Verify at 07/27/22 1503   acetaminophen (TYLENOL) tablet 650 mg  650 mg Oral Q6H PRN Zierle-Ghosh, Asia B, DO       Or   acetaminophen (TYLENOL) suppository 650 mg  650 mg Rectal Q6H PRN Zierle-Ghosh, Asia B, DO       ascorbic acid (VITAMIN C) tablet 500 mg  500 mg Oral Daily Aviva Signs, MD   500 mg at 07/27/22 1340   aspirin EC tablet 81 mg  81 mg Oral Daily Zierle-Ghosh, Asia B, DO   81 mg at 07/27/22 0855   cefTRIAXone (ROCEPHIN) 2 g in sodium chloride 0.9 % 100 mL IVPB  2 g  Intravenous Q24H Aviva Signs, MD   Stopped at 07/27/22 1044   DULoxetine (CYMBALTA) DR capsule 60 mg  60 mg Oral Daily Aviva Signs, MD   60 mg at 07/27/22 0855   ezetimibe (ZETIA) tablet 10 mg  10 mg Oral Daily Aviva Signs, MD   10 mg at 07/27/22 0855   feeding supplement (ENSURE ENLIVE / ENSURE PLUS) liquid 237 mL  237 mL Oral TID WC Aviva Signs, MD       feeding supplement (GLUCERNA SHAKE) ([place]) liquid 237 mL  237 mL Oral Q24H Barton Dubois, MD   237 mL at 07/27/22 1340   [START ON 07/28/2022] glimepiride (AMARYL) tablet 4 mg  4 mg Oral Q breakfast Aviva Signs, MD       heparin injection 5,000 Units  5,000  Units Subcutaneous Q8H Aviva Signs, MD   5,000 Units at 07/27/22 1340   hydrOXYzine (ATARAX) tablet 25 mg  25 mg Oral Daily Aviva Signs, MD   25 mg at 07/27/22 0902   insulin aspart (novoLOG) injection 0-15 Units  0-15 Units Subcutaneous TID WC Aviva Signs, MD   3 Units at 07/27/22 1227   insulin aspart (novoLOG) injection 0-5 Units  0-5 Units Subcutaneous QHS Aviva Signs, MD   2 Units at 07/26/22 2231   insulin glargine-yfgn (SEMGLEE) injection 25 Units  25 Units Subcutaneous QHS Aviva Signs, MD   25 Units at 07/26/22 2222   [START ON 07/28/2022] isosorbide mononitrate (IMDUR) 24 hr tablet 30 mg  30 mg Oral Daily Barton Dubois, MD       metoprolol tartrate (LOPRESSOR) tablet 25 mg  25 mg Oral BID Aviva Signs, MD   25 mg at 07/27/22 1008   morphine (PF) 2 MG/ML injection 2 mg  2 mg Intravenous Q2H PRN Aviva Signs, MD       mupirocin ointment (BACTROBAN) 2 % 1 Application  1 Application Nasal BID Aviva Signs, MD   1 Application at [id number]   nicotine (NICODERM CQ - dosed in mg/24 hours) patch 14 mg  14 mg Transdermal Daily Aviva Signs, MD   14 mg at 07/27/22 0901   ondansetron (ZOFRAN) tablet 4 mg  4 mg Oral Q6H PRN Zierle-Ghosh, Asia B, DO       Or   ondansetron (ZOFRAN) injection 4 mg  4 mg Intravenous Q6H PRN Zierle-Ghosh, Asia B, DO       oxyCODONE (Oxy IR/ROXICODONE) immediate release tablet 5 mg  5 mg Oral Q4H PRN Aviva Signs, MD   5 mg at 07/27/22 0913   rosuvastatin (CRESTOR) tablet 10 mg  10 mg Oral Daily Aviva Signs, MD   10 mg at 07/27/22 0854   vancomycin (VANCOREADY) IVPB 750 mg/150 mL  750 mg Intravenous Q12H Aviva Signs, MD   Stopped at 07/27/22 [phone]     Discharge Medications: Please see discharge summary for a list of discharge medications.  Relevant Imaging Results:  Relevant Lab Results:   Additional Information SSN: [id number] [place]

## 2022-07-27 NOTE — Progress Notes (Signed)
Initial Nutrition Assessment  DOCUMENTATION CODES:   Morbid obesity  INTERVENTION:  Ensure Enlive po BID, (prefers vanilla or strawberry flavor)  Vitamin C 500 mg daily  Nutrition Edu attached to AVS (Heart Healthy)  NUTRITION DIAGNOSIS:   Inadequate oral intake related to decreased appetite as evidenced by meal completion < 25%, per patient/family report.   GOAL:  Patient will meet greater than or equal to 90% of their needs   MONITOR:  PO intake, Supplement acceptance, Skin, Labs, Weight trends  REASON FOR ASSESSMENT:   Malnutrition Screening Tool    ASSESSMENT: Patient is a 72 yo female with hx of DM2, COPD, and CAD. Presents from home with left breast pain. She is status post left mastectomy 06/25/22.  Procedure: 3/5/ Debridement of left breast wound per surgery  Meal intake- <25% currently. Patient reports poor appetite. She was able to eat her sausage for breakfast. Offered Ensure for patient which she likes (vanilla or strawberry). Provided for her and will order with meals given her current poor intake.  Patient weight has been ranging 106-109 kg the past 4 months.  Medications reviewed.      Latest Ref Rng & Units 07/27/2022    4:38 AM 07/26/2022    4:58 AM 07/25/2022    5:15 PM  BMP  Glucose 70 - 99 mg/dL 165  162  171   BUN 8 - 23 mg/dL '13  11  13   '$ Creatinine 0.44 - 1.00 mg/dL 1.14  1.06  1.11   Sodium 135 - 145 mmol/L 132  131  130   Potassium 3.5 - 5.1 mmol/L 3.6  3.8  4.1   Chloride 98 - 111 mmol/L 99  98  97   CO2 22 - 32 mmol/L '26  24  25   '$ Calcium 8.9 - 10.3 mg/dL 10.0  9.9  10.2       NUTRITION - FOCUSED PHYSICAL EXAM: NFPE conducted findings are no fat depletion, no muscle depletion and no edema.   Diet Order:   Diet Order             Diet heart healthy/carb modified Room service appropriate? Yes; Fluid consistency: Thin  Diet effective now                   EDUCATION NEEDS:  Education needs have been addressed  Skin:  Skin  Assessment: Skin Integrity Issues: Skin Integrity Issues:: Incisions Incisions: left breast  Last BM:  3/4  Height:   Ht Readings from Last 1 Encounters:  07/26/22 '5\' 4"'$  (1.626 m)    Weight:   Wt Readings from Last 1 Encounters:  07/26/22 106 kg    Ideal Body Weight:   55 kg  BMI:  Body mass index is 40.11 kg/m.  Estimated Nutritional Needs:   Kcal:  1700-1800  Protein:  100-110 gr  Fluid:  1.7-1.8 Liters daily  Colman Cater MS,RD,CSG,LDN Contact: AMION

## 2022-07-27 NOTE — NC FL2 (Signed)
[place] LEVEL OF CARE FORM     IDENTIFICATION  Patient Name: Katelyn Kaufman Birthdate: December 08, 1950 Sex: female Admission Date (Current Location): 07/25/2022  [facility] and [place] Number:  [employer] and Address:  [place] [address] [place]      Provider Number: [id number]  Attending Physician Name and Address:  Aviva Signs, MD  Relative Name and Phone Number:       Current Level of Care: Hospital Recommended Level of Care: [place] Prior Approval Number:    Date Approved/Denied:   PASRR Number:    Discharge Plan: SNF    Current Diagnoses: Patient Active Problem List   Diagnosis Date Noted   Hematoma of left breast 07/26/2022   Abscess after procedure 07/25/2022   Tobacco use disorder 07/25/2022   Thyromegaly 07/25/2022   S/P left mastectomy 06/25/2022   Breast cancer of upper-outer quadrant of left female breast ([place]) 12/10/2021   Acute coronary syndrome ([place])    Type 2 diabetes mellitus with complication, with long-term current use of insulin ([place]) 11/16/2021   Hypertension 11/16/2021   Hyperlipidemia 11/16/2021   Morbid obesity ([place]) 11/16/2021   Acute myocardial infarction involving left main coronary artery ([place]) 11/16/2021   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   Coronary artery disease 11/16/2021   Non-ST elevation (NSTEMI) myocardial infarction ([place])     Orientation RESPIRATION BLADDER Height & Weight     Self, Time, Situation, Place  Normal Continent Weight: 233 lb 11 oz (106 kg) Height:  '5\' 4"'$  (162.6 cm)  BEHAVIORAL SYMPTOMS/MOOD NEUROLOGICAL BOWEL NUTRITION STATUS      Continent Diet (see dc summary)  AMBULATORY STATUS COMMUNICATION OF NEEDS Skin   Extensive Assist Verbally Surgical wounds, Wound Vac                       Personal Care Assistance Level of Assistance  Bathing, Feeding, Dressing Bathing Assistance: Limited assistance Feeding assistance: Independent Dressing  Assistance: Limited assistance     Functional Limitations Info  Sight, Hearing, Speech Sight Info: Adequate Hearing Info: Adequate Speech Info: Adequate    SPECIAL CARE FACTORS FREQUENCY  PT (By licensed PT), OT (By licensed OT)     PT Frequency: 5x week OT Frequency: 5x week            Contractures Contractures Info: Not present    Additional Factors Info  Code Status, Allergies, Psychotropic Code Status Info: Full Allergies Info: Latex Psychotropic Info: Cymbalta         Current Medications (07/27/2022):  This is the current hospital active medication list Current Facility-Administered Medications  Medication Dose Route Frequency Provider Last Rate Last Admin   0.9 %  sodium chloride infusion   Intravenous Continuous Aviva Signs, MD 10 mL/hr at 07/27/22 1503 Infusion Verify at 07/27/22 1503   acetaminophen (TYLENOL) tablet 650 mg  650 mg Oral Q6H PRN Zierle-Ghosh, Asia B, DO       Or   acetaminophen (TYLENOL) suppository 650 mg  650 mg Rectal Q6H PRN Zierle-Ghosh, Asia B, DO       ascorbic acid (VITAMIN C) tablet 500 mg  500 mg Oral Daily Aviva Signs, MD   500 mg at 07/27/22 1340   aspirin EC tablet 81 mg  81 mg Oral Daily Zierle-Ghosh, Asia B, DO   81 mg at 07/27/22 0855   cefTRIAXone (ROCEPHIN) 2 g in sodium chloride 0.9 % 100 mL IVPB  2 g  Intravenous Q24H Aviva Signs, MD   Stopped at 07/27/22 1044   DULoxetine (CYMBALTA) DR capsule 60 mg  60 mg Oral Daily Aviva Signs, MD   60 mg at 07/27/22 0855   ezetimibe (ZETIA) tablet 10 mg  10 mg Oral Daily Aviva Signs, MD   10 mg at 07/27/22 0855   feeding supplement (ENSURE ENLIVE / ENSURE PLUS) liquid 237 mL  237 mL Oral TID WC Aviva Signs, MD       feeding supplement (GLUCERNA SHAKE) ([place]) liquid 237 mL  237 mL Oral Q24H Barton Dubois, MD   237 mL at 07/27/22 1340   [START ON 07/28/2022] glimepiride (AMARYL) tablet 4 mg  4 mg Oral Q breakfast Aviva Signs, MD       heparin injection 5,000 Units  5,000  Units Subcutaneous Q8H Aviva Signs, MD   5,000 Units at 07/27/22 1340   hydrOXYzine (ATARAX) tablet 25 mg  25 mg Oral Daily Aviva Signs, MD   25 mg at 07/27/22 0902   insulin aspart (novoLOG) injection 0-15 Units  0-15 Units Subcutaneous TID WC Aviva Signs, MD   3 Units at 07/27/22 1227   insulin aspart (novoLOG) injection 0-5 Units  0-5 Units Subcutaneous QHS Aviva Signs, MD   2 Units at 07/26/22 2231   insulin glargine-yfgn (SEMGLEE) injection 25 Units  25 Units Subcutaneous QHS Aviva Signs, MD   25 Units at 07/26/22 2222   [START ON 07/28/2022] isosorbide mononitrate (IMDUR) 24 hr tablet 30 mg  30 mg Oral Daily Barton Dubois, MD       metoprolol tartrate (LOPRESSOR) tablet 25 mg  25 mg Oral BID Aviva Signs, MD   25 mg at 07/27/22 1008   morphine (PF) 2 MG/ML injection 2 mg  2 mg Intravenous Q2H PRN Aviva Signs, MD       mupirocin ointment (BACTROBAN) 2 % 1 Application  1 Application Nasal BID Aviva Signs, MD   1 Application at [id number]   nicotine (NICODERM CQ - dosed in mg/24 hours) patch 14 mg  14 mg Transdermal Daily Aviva Signs, MD   14 mg at 07/27/22 0901   ondansetron (ZOFRAN) tablet 4 mg  4 mg Oral Q6H PRN Zierle-Ghosh, Asia B, DO       Or   ondansetron (ZOFRAN) injection 4 mg  4 mg Intravenous Q6H PRN Zierle-Ghosh, Asia B, DO       oxyCODONE (Oxy IR/ROXICODONE) immediate release tablet 5 mg  5 mg Oral Q4H PRN Aviva Signs, MD   5 mg at 07/27/22 0913   rosuvastatin (CRESTOR) tablet 10 mg  10 mg Oral Daily Aviva Signs, MD   10 mg at 07/27/22 0854   vancomycin (VANCOREADY) IVPB 750 mg/150 mL  750 mg Intravenous Q12H Aviva Signs, MD   Stopped at 07/27/22 [phone]     Discharge Medications: Please see discharge summary for a list of discharge medications.  Relevant Imaging Results:  Relevant Lab Results:   Additional Information SSN: [id number] [place]

## 2022-07-27 NOTE — Evaluation (Signed)
Physical Therapy Evaluation Patient Details Name: Katelyn Kaufman MRN: [id number] DOB: March 31, 1951 Today's Date: 07/27/2022  History of Present Illness  Katelyn Kaufman is a 72 y.o. female with medical history significant of anxiety, coronary artery disease, COPD, depression, hypertension, hyperlipidemia, OSA, type 2 diabetes mellitus, presents the ED with a chief complaint of left breast pain.  Patient reports that she had a mastectomy on June 25, 2022 due to breast cancer.  She has had pain since the surgery but it got acutely worse last night.  She reports she has had golden colored drainage and large amounts since the staples were taken out.  She developed a fever 2 days ago.  She reports a measured temperature of 99.9 but feeling feverish with chills.  She has had some nausea and vomiting for the last 4-5 days.  Any solid food she eats comes right back up.  She had no appetite.  She denies any hematemesis.  She does have some periumbilical abdominal pain that is crampy.  It is worse when she is heaving.  Patient reports that she has infrequent bowel movements at baseline but her last bowel movement was 2 days ago.  Patient has noticed no erythema at the surgical site.  She reports she has not been on any antibiotics recently.  On review of systems she reports some shortness of breath that is chronic.  She is not on any oxygen supplementation at home.  She has had some palpitations and chest pain yesterday.  She took a nitro and they were resolved.  She reports that the palpitations and chest pain were associated with nausea, diaphoresis, dizziness.  The dizziness has been 1 week intermittent.  She has no other complaints at this time.   Clinical Impression  Patient demonstrates slow labored movement for sitting up at bedside requiring frequent rest breaks due to left sided chest wall pain at site of wound vac placement, slow labored cadence with occasional buckling of knees due to weakness and  tolerated sitting up in chair with family members in room after therapy.  Patient will benefit from continued skilled physical therapy in hospital and recommended venue below to increase strength, balance, endurance for safe ADLs and gait.         Recommendations for follow up therapy are one component of a multi-disciplinary discharge planning process, led by the attending physician.  Recommendations may be updated based on patient status, additional functional criteria and insurance authorization.  Follow Up Recommendations Skilled nursing-short term rehab (<3 hours/day) Can patient physically be transported by private vehicle: Yes    Assistance Recommended at Discharge Set up Supervision/Assistance  Patient can return home with the following  A lot of help with walking and/or transfers;A little help with bathing/dressing/bathroom;Help with stairs or ramp for entrance;Assistance with cooking/housework    Equipment Recommendations None recommended by PT  Recommendations for Other Services       Functional Status Assessment Patient has had a recent decline in their functional status and demonstrates the ability to make significant improvements in function in a reasonable and predictable amount of time.     Precautions / Restrictions Precautions Precautions: Fall Restrictions Weight Bearing Restrictions: No      Mobility  Bed Mobility Overal bed mobility: Needs Assistance Bed Mobility: Supine to Sit     Supine to sit: Min assist, Min guard, HOB elevated     General bed mobility comments: increased time, labored movement with frequent rest breaks and HOB partially raised  Transfers Overall transfer level: Needs assistance Equipment used: Rolling walker (2 wheels) Transfers: Sit to/from Stand, Bed to chair/wheelchair/BSC Sit to Stand: Min guard, Min assist   Step pivot transfers: Min guard, Min assist       General transfer comment: unsteady labored movement     Ambulation/Gait Ambulation/Gait assistance: Min Web designer (Feet): 22 Feet Assistive device: Rolling walker (2 wheels) Gait Pattern/deviations: Decreased step length - right, Decreased step length - left, Decreased stride length, Trunk flexed Gait velocity: decreased     General Gait Details: slow labored unsteady cadence requiring increased time for making turns, limited mostly due to fatigue and left sided chest wall pain at insertion of wound vac  Stairs            Wheelchair Mobility    Modified Rankin (Stroke Patients Only)       Balance Overall balance assessment: Needs assistance Sitting-balance support: Feet supported, No upper extremity supported Sitting balance-Leahy Scale: Fair Sitting balance - Comments: fair/good seated at EOB   Standing balance support: During functional activity, Bilateral upper extremity supported Standing balance-Leahy Scale: Fair Standing balance comment: using RW                             Pertinent Vitals/Pain Pain Assessment Pain Assessment: 0-10 Pain Score: 8  Pain Location: left side chest wall Pain Descriptors / Indicators: Discomfort, Grimacing, Guarding, Sore Pain Intervention(s): Limited activity within patient's tolerance, Monitored during session, Repositioned    Home Living Family/patient expects to be discharged to:: Private residence Living Arrangements: Alone Available Help at Discharge: Family;Available PRN/intermittently Type of Home: House Home Access: Level entry       Home Layout: One level Home Equipment: Cane - single point;Rollator (4 wheels)      Prior Function Prior Level of Function : Independent/Modified Independent             Mobility Comments: Household and short distanced Electronics engineer and SPC ADLs Comments: Independent     Hand Dominance        Extremity/Trunk Assessment   Upper Extremity Assessment Upper Extremity Assessment:  Generalized weakness    Lower Extremity Assessment Lower Extremity Assessment: Generalized weakness    Cervical / Trunk Assessment Cervical / Trunk Assessment: Normal  Communication   Communication: No difficulties  Cognition Arousal/Alertness: Awake/alert Behavior During Therapy: WFL for tasks assessed/performed Overall Cognitive Status: Within Functional Limits for tasks assessed                                          General Comments      Exercises     Assessment/Plan    PT Assessment Patient needs continued PT services  PT Problem List Decreased strength;Decreased activity tolerance;Decreased balance;Decreased mobility       PT Treatment Interventions DME instruction;Gait training;Stair training;Functional mobility training;Therapeutic activities;Therapeutic exercise;Patient/family education;Balance training    PT Goals (Current goals can be found in the Care Plan section)  Acute Rehab PT Goals Patient Stated Goal: return home after rehab PT Goal Formulation: With patient/family Time For Goal Achievement: 08/10/22 Potential to Achieve Goals: Good    Frequency Min 3X/week     Co-evaluation               AM-PAC PT "6 Clicks" Mobility  Outcome Measure Help needed turning from your back to  your side while in a flat bed without using bedrails?: A Little Help needed moving from lying on your back to sitting on the side of a flat bed without using bedrails?: A Little Help needed moving to and from a bed to a chair (including a wheelchair)?: A Little Help needed standing up from a chair using your arms (e.g., wheelchair or bedside chair)?: A Little Help needed to walk in hospital room?: A Lot Help needed climbing 3-5 steps with a railing? : A Lot 6 Click Score: 16    End of Session   Activity Tolerance: Patient tolerated treatment well;Patient limited by fatigue Patient left: in chair;with call bell/phone within reach;with family/visitor  present Nurse Communication: Mobility status PT Visit Diagnosis: Unsteadiness on feet (R26.81);Other abnormalities of gait and mobility (R26.89);Muscle weakness (generalized) (M62.81)    Time: [id number] PT Time Calculation (min) (ACUTE ONLY): 27 min   Charges:   PT Evaluation $PT Eval Moderate Complexity: 1 Mod PT Treatments $Therapeutic Activity: 23-37 mins        3:34 PM, 07/27/22 Lonell Grandchild, MPT Physical Therapist with [facility] [phone] office [phone] mobile phone

## 2022-07-27 NOTE — Progress Notes (Signed)
1 Day Post-Op  Subjective: Patient having minimal incisional pain.  Objective: Vital signs in last 24 hours: Temp:  [97.5 F (36.4 C)-98.8 F (37.1 C)] 98.3 F (36.8 C) (03/05 0437) Pulse Rate:  [65-79] 74 (03/05 0437) Resp:  [18-26] 18 (03/05 0437) BP: (104-151)/(51-133) 113/71 (03/05 0437) SpO2:  [90 %-100 %] 90 % (03/05 0437) Weight:  [106 kg] 106 kg (03/04 1135) Last BM Date : 07/25/22  Intake/Output from previous day: 03/04 0701 - 03/05 0700 In: 908.9 [P.O.:480; I.V.:402.3; IV Piggyback:26.6] Out: 10 [Blood:10] Intake/Output this shift: No intake/output data recorded.  General appearance: alert, cooperative, and no distress Resp: clear to auscultation bilaterally Breasts: Wound VAC in place with serosanguineous drainage Cardio: regular rate and rhythm, S1, S2 normal, no murmur, click, rub or gallop  Lab Results:  Recent Labs    07/26/22 0458 07/27/22 0438  WBC 6.3 7.5  HGB 11.8* 10.3*  HCT 34.3* 30.8*  PLT 201 207   BMET Recent Labs    07/26/22 0458 07/27/22 0438  NA 131* 132*  K 3.8 3.6  CL 98 99  CO2 24 26  GLUCOSE 162* 165*  BUN 11 13  CREATININE 1.06* 1.14*  CALCIUM 9.9 10.0   PT/INR Recent Labs    07/25/22 1715  LABPROT 14.1  INR 1.1    Studies/Results: CT Chest W Contrast  Result Date: 07/25/2022 CLINICAL DATA:  Status post left mastectomy on 2/5, now with infection, cellulitis versus abscess EXAM: CT CHEST WITH CONTRAST TECHNIQUE: Multidetector CT imaging of the chest was performed during intravenous contrast administration. RADIATION DOSE REDUCTION: This exam was performed according to the departmental dose-optimization program which includes automated exposure control, adjustment of the mA and/or kV according to patient size and/or use of iterative reconstruction technique. CONTRAST:  28m OMNIPAQUE IOHEXOL 300 MG/ML  SOLN COMPARISON:  None Available. FINDINGS: Cardiovascular: The heart is normal in size. No pericardial effusion. No  evidence of thoracic aortic aneurysm. Atherosclerotic calcifications of the aortic arch. Severe three-vessel coronary atherosclerosis. Postsurgical changes related to prior CABG. Mediastinum/Nodes: Small left axillary nodes measuring up to 8 mm short axis (series 2/images 30 and 52), warranting attention on follow-up. No suspicious mediastinal lymphadenopathy. Visualized thyroid is unremarkable. Lungs/Pleura: Mild centrilobular and paraseptal emphysematous changes, upper lung predominant. No suspicious pulmonary nodules. No focal consolidation. No pleural effusion or pneumothorax. Upper Abdomen: Visualized upper abdomen is grossly unremarkable, noting vascular calcifications. Musculoskeletal: Status post left mastectomy. Associated 5.5 x 14.4 x 7.9 cm fluid and gas collection in the surgical bed. While postoperative seroma is possible, given the clinical setting (approximally 1 month since initial surgery), this appearance is worrisome for postoperative infection/abscess. Overlying subcutaneous stranding with cutaneous thickening (series 2/image 87), suggesting associated cellulitis. Degenerative changes of the mid/lower thoracic spine. Median sternotomy. IMPRESSION: Status post left mastectomy. 5.5 x 14.4 x 7.9 cm fluid and gas collection in the surgical bed, worrisome for postoperative infection/abscess. Associated cellulitis. Small left axillary nodes measuring up to 8 mm short axis, possibly reactive, but warranting attention on follow-up. Aortic Atherosclerosis (ICD10-I70.0) and Emphysema (ICD10-J43.9). Electronically Signed   By: SJulian HyM.D.   On: 07/25/2022 18:09   DG Chest Port 1 View  Result Date: 07/25/2022 CLINICAL DATA:  Sepsis. EXAM: PORTABLE CHEST 1 VIEW COMPARISON:  Chest x-ray 12/06/2021 and older FINDINGS: Status post median sternotomy. Mediastinal clips. Overlapping cardiac leads. No consolidation, pneumothorax or effusion. No edema. Normal cardiopericardial silhouette. Eventration of  the right hemidiaphragm. IMPRESSION: No acute cardiopulmonary disease Electronically Signed   By:  Jill Side M.D.   On: 07/25/2022 17:23    Anti-infectives: Anti-infectives (From admission, onward)    Start     Dose/Rate Route Frequency Ordered Stop   07/27/22 0900  cefTRIAXone (ROCEPHIN) 2 g in sodium chloride 0.9 % 100 mL IVPB        2 g 200 mL/hr over 30 Minutes Intravenous Every 24 hours 07/27/22 0805     07/26/22 1600  vancomycin (VANCOREADY) IVPB 750 mg/150 mL        750 mg 150 mL/hr over 60 Minutes Intravenous Every 12 hours 07/26/22 1537 07/31/22 1629   07/26/22 1200  vancomycin (VANCOREADY) IVPB 750 mg/150 mL  Status:  Discontinued        750 mg 150 mL/hr over 60 Minutes Intravenous Every 12 hours 07/25/22 2003 07/25/22 2009   07/26/22 1200  vancomycin (VANCOREADY) IVPB 750 mg/150 mL  Status:  Discontinued        750 mg 150 mL/hr over 60 Minutes Intravenous Every 12 hours 07/25/22 2009 07/26/22 1537   07/25/22 2015  vancomycin (VANCOCIN) IVPB 1000 mg/200 mL premix        1,000 mg 200 mL/hr over 60 Minutes Intravenous  Once 07/25/22 2003 07/25/22 2210   07/25/22 1830  vancomycin (VANCOCIN) IVPB 1000 mg/200 mL premix        1,000 mg 200 mL/hr over 60 Minutes Intravenous  Once 07/25/22 1822 07/25/22 1952       Assessment/Plan: s/p Procedure(s): EVACUATION OF HEMATOMA BREAST, DEBRIDEMENT OF LEFT BREAST WOUND Impression: Stable on postoperative day 1.  Labs reviewed.  Will get social services consultation for discharge planning.  Gram stain shows multiple various bacteria.  Will de-escalate antibiotics once final report from microbiology finished.   LOS: 1 day    Aviva Signs 07/27/2022

## 2022-07-27 NOTE — Progress Notes (Signed)
Progress Note   Patient: Katelyn Kaufman [id number] DOB: 07-02-1950 DOA: 07/25/2022     1 DOS: the patient was seen and examined on 07/27/2022   Brief hospital course: As per H&P written by Dr.Zierle-Ghosh on 07/25/22  Katelyn Kaufman is a 72 y.o. female with medical history significant of anxiety, coronary artery disease, COPD, depression, hypertension, hyperlipidemia, OSA, type 2 diabetes mellitus, presents the ED with a chief complaint of left breast pain.  Patient reports that she had a mastectomy on June 25, 2022 due to breast cancer.  She has had pain since the surgery but it got acutely worse last night.  She reports she has had golden colored drainage and large amounts since the staples were taken out.  She developed a fever 2 days ago.  She reports a measured temperature of 99.9 but feeling feverish with chills.  She has had some nausea and vomiting for the last 4-5 days.  Any solid food she eats comes right back up.  She had no appetite.  She denies any hematemesis.  She does have some periumbilical abdominal pain that is crampy.  It is worse when she is heaving.  Patient reports that she has infrequent bowel movements at baseline but her last bowel movement was 2 days ago.  Patient has noticed no erythema at the surgical site.  She reports she has not been on any antibiotics recently.  On review of systems she reports some shortness of breath that is chronic.  She is not on any oxygen supplementation at home.  She has had some palpitations and chest pain yesterday.  She took a nitro and they were resolved.  She reports that the palpitations and chest pain were associated with nausea, diaphoresis, dizziness.  The dizziness has been 1 week intermittent.  She has no other complaints at this time.   Patient does smoke.  She request nicotine patch.  Patient does not drink alcohol and does not use illicit drugs.  She is vaccinated for COVID.  Patient is full code.   Assessment and Plan: *  Abscess after procedure - S/p surgical debridement and deep cultures taken. -Currently afebrile and with normal WBCs -Continue current IV antibiotics and follow culture result -Continue to maintain adequate hydration, as needed analgesics and supportive care -Follow general surgery recommendations regarding postoperative care and further wound management; currently wound VAC in place.  Thyromegaly -TSH within normal limits. -Continue outpatient follow-up.  Tobacco use disorder - Smokes half a pack per day - Cessation counseling provided -Continue nicotine patch.  S/P left mastectomy -Continue to follow general surgery recommendations and postoperative management.  Coronary artery disease - Continue aspirin, metoprolol and adjusted dose of Imdur  -Will also continue the use of Crestor - Currently not demonstrating any complaints that would suggest ACS.  Hyperlipidemia - Continue Zetia and Crestor -Heart healthy diet discussed with patient.  Hypertension - Blood pressure soft most likely, analgesics usage and current situation -Imdur dose has been adjusted; continue metoprolol. -Losartan placed on hold -Follow-up vital signs.  Type 2 diabetes mellitus with complication, with long-term current use of insulin (HCC) -Continue modified carbohydrate diet -Continue to follow CBGs fluctuation and adjust hypoglycemic regimen as needed -Continue sliding scale insulin and long acting.  Morbid obesity: -Body mass index is 40.11 kg/m. -Low-calorie diet, portion control and increase physical activity discussed with patient.  Subjective:  Afebrile, no chest pain, no nausea or vomiting.  Reporting decreased appetite and general malaise.  Soft blood pressure has been appreciated.  Good saturation on room air.  Physical Exam: Vitals:   07/26/22 1433 07/26/22 1731 07/26/22 2233 07/27/22 0437  BP: (!) 119/54 (!) 104/51 (!) 109/56 113/71  Pulse: 76 76 74 74  Resp: '18 18 20 18  '$ Temp:  98.5 F (36.9 C) 98.8 F (37.1 C) 98.5 F (36.9 C) 98.3 F (36.8 C)  TempSrc: Oral Oral Oral Oral  SpO2: 100% 100% 99% 90%  Weight:      Height:       General exam: Alert, awake, oriented x 3; in no major distress.  Reporting some chest discomfort around wound VAC and recent surgical intervention.  No fever, decreased appetite consult blood pressure noticed. Respiratory system: Clear to auscultation. Respiratory effort normal.  Good saturation on room air. Cardiovascular system:RRR.  No rubs or gallops. Gastrointestinal system: Abdomen is obese, nondistended, soft and nontender. No organomegaly or masses felt. Normal bowel sounds heard. Central nervous system: Alert and oriented. No focal neurological deficits. Extremities: No Cyanosis or clubbing. Skin: No petechiae; left chest with left mastectomy site demonstrating improvement in erythematous changes and with wound VAC in place.  Patient expressed some pain around surgical area. Psychiatry: Judgement and insight appear normal. Mood & affect appropriate.    Data Reviewed: [facility] metabolic panel: Sodium [id number], potassium 3.6, chloride 99, bicarb 26, BUN 13, creatinine 1.14 and GFR 51 CBC: WBCs 7.5, hemoglobin 10.3 and platelet count 207K CBG: 140>> 180.  Family Communication: No family at bedside.  Disposition: Status is: Inpatient Remains inpatient appropriate because: Continue treatment with IV antibiotics and follow recommendations by general surgery.   Planned Discharge Destination: Home  Time spent: 30 minutes  Author: Barton Dubois, MD 07/27/2022 1:10 PM  For on call review [email or web address].

## 2022-07-27 NOTE — Progress Notes (Signed)
Patient alert and oriented, tolerated medications whole. Patient reported some complaints of pain to left breast earlier during shift PRN given, see MAR. Patients blood pressure this morning at 0900 was 115/70, pulse 63. MD Dyann Kief made aware. New orders hold patients Imdur and Losartan. Patients vital signs checked at 1329 noted blood pressure 102/52, respirations 21. MD Dyann Kief made aware. No new orders. Patient ambulated with assistance to and from the bathroom. Wound vac dressing clean, dry and intact.

## 2022-07-27 NOTE — TOC Initial Note (Signed)
Transition of Care [facility]) - Initial/Assessment Note    Patient Details  Name: Katelyn Kaufman MRN: [id number] Date of Birth: May 26, 1950  Transition of Care [facility]) CM/SW Contact:    Shade Flood, LCSW Phone Number: 07/27/2022, 3:35 PM  Clinical Narrative:                  Pt admitted from home. PT recommending SNF rehab. Spoke with pt and her sisters at bedside to review dc planning. Pt agreeable to SNF referrals. CMS provider options reviewed and will refer as requested.  MD anticipating dc in 1-2 days. Will initiate insurance auth as well.  TOC will follow.  Expected Discharge Plan: Skilled Nursing Facility Barriers to Discharge: Continued Medical Work up   Patient Goals and CMS Choice Patient states their goals for this hospitalization and ongoing recovery are:: rehab CMS Medicare.gov Compare Post Acute Care list provided to:: Patient Choice offered to / list presented to : Patient [place] ownership interest in [facility].provided to:: Patient    Expected Discharge Plan and Services In-house Referral: Clinical Social Work   Post Acute Care Choice: [place] Living arrangements for the past 2 months: Apartment                                      Prior Living Arrangements/Services Living arrangements for the past 2 months: Apartment Lives with:: Self Patient language and need for interpreter reviewed:: Yes Do you feel safe going back to the place where you live?: Yes      Need for Family Participation in Patient Care: Yes (Comment) Care giver support system in place?: Yes (comment)   Criminal Activity/Legal Involvement Pertinent to Current Situation/Hospitalization: No - Comment as needed  Activities of Daily Living Home Assistive Devices/Equipment: Cane (specify quad or straight), Walker (specify type), Shower chair with back ADL Screening (condition at time of admission) Patient's cognitive ability adequate to safely complete  daily activities?: Yes Is the patient deaf or have difficulty hearing?: No Does the patient have difficulty seeing, even when wearing glasses/contacts?: Yes Does the patient have difficulty concentrating, remembering, or making decisions?: Yes Patient able to express need for assistance with ADLs?: Yes Does the patient have difficulty dressing or bathing?: Yes Independently performs ADLs?: Yes (appropriate for developmental age) Does the patient have difficulty walking or climbing stairs?: Yes Weakness of Legs: Both Weakness of Arms/Hands: Both  Permission Sought/Granted Permission sought to share information with : Facility Art therapist granted to share information with : Yes, Verbal Permission Granted     Permission granted to share info w AGENCY: snfs        Emotional Assessment Appearance:: Appears stated age Attitude/Demeanor/Rapport: Engaged Affect (typically observed): Pleasant Orientation: : Oriented to Self, Oriented to Place, Oriented to  Time, Oriented to Situation Alcohol / Substance Use: Not Applicable Psych Involvement: No (comment)  Admission diagnosis:  Post-operative wound abscess [T81.49XA] Abscess after procedure [T81.49XA] Patient Active Problem List   Diagnosis Date Noted   Hematoma of left breast 07/26/2022   Abscess after procedure 07/25/2022   Tobacco use disorder 07/25/2022   Thyromegaly 07/25/2022   S/P left mastectomy 06/25/2022   Breast cancer of upper-outer quadrant of left female breast ([place]) 12/10/2021   Acute coronary syndrome ([place])    Type 2 diabetes mellitus with complication, with long-term current use of insulin ([place]) 11/16/2021   Hypertension 11/16/2021   Hyperlipidemia 11/16/2021  Morbid obesity ([place]) 11/16/2021   Acute myocardial infarction involving left main coronary artery ([place]) 11/16/2021   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   Coronary artery disease 11/16/2021   Non-ST elevation (NSTEMI) myocardial infarction [facility])     PCP:  Luciano Cutter, DO Pharmacy:   [facility] [place], [address] [place] [address] Phone: [phone] Fax: [phone]  [facility] [place] - [place], [address] [place] [address] Phone: [phone] Fax: [phone]     Social Determinants of Health (SDOH) Social History: SDOH Screenings   Food Insecurity: Food Insecurity Present (07/25/2022)  Housing: Medium Risk (07/25/2022)  Transportation Needs: No Transportation Needs (07/25/2022)  Utilities: Not At Risk (07/25/2022)  Tobacco Use: High Risk (07/26/2022)   SDOH Interventions: Food Insecurity Interventions: Inpatient TOC Housing Interventions: Inpatient TOC   Readmission Risk Interventions    11/23/2021    9:39 AM  Readmission Risk Prevention Plan  Post Dischage Appt Complete  Medication Screening Complete  Transportation Screening Complete

## 2022-07-27 NOTE — Telephone Encounter (Signed)
ALLERGIES/CONTRAINDICATIONS Latex   Sarah at [facility]	[place] rx notified

## 2022-07-27 NOTE — Progress Notes (Signed)
  DOB: 05/28/1950 Date: 07/27/22   Must ID: [id number]   To Whom it May Concern:  Please be advised that the above named patient will require a short-term nursing home stay- anticipated 30 days or less rehabilitation and strengthening. The plan is for return home.

## 2022-07-28 DIAGNOSIS — T8149XA Infection following a procedure, other surgical site, initial encounter: Secondary | ICD-10-CM | POA: Diagnosis not present

## 2022-07-28 LAB — CBC
HCT: 31.3 % — ABNORMAL LOW (ref 36.0–46.0)
Hemoglobin: 10.4 g/dL — ABNORMAL LOW (ref 12.0–15.0)
MCH: 31.2 pg (ref 26.0–34.0)
MCHC: 33.2 g/dL (ref 30.0–36.0)
MCV: 94 fL (ref 80.0–100.0)
Platelets: 197 10*3/uL (ref 150–400)
RBC: 3.33 MIL/uL — ABNORMAL LOW (ref 3.87–5.11)
RDW: 13.5 % (ref 11.5–15.5)
WBC: 6.7 10*3/uL (ref 4.0–10.5)
nRBC: 0 % (ref 0.0–0.2)

## 2022-07-28 LAB — BASIC METABOLIC PANEL
Anion gap: 8 (ref 5–15)
BUN: 12 mg/dL (ref 8–23)
CO2: 24 mmol/L (ref 22–32)
Calcium: 9.8 mg/dL (ref 8.9–10.3)
Chloride: 100 mmol/L (ref 98–111)
Creatinine, Ser: 0.94 mg/dL (ref 0.44–1.00)
GFR, Estimated: >60 mL/min (ref >60)
Glucose, Bld: 143 mg/dL — ABNORMAL HIGH (ref 70–99)
Potassium: 4 mmol/L (ref 3.5–5.1)
Sodium: 132 mmol/L — ABNORMAL LOW (ref 135–145)

## 2022-07-28 LAB — GLUCOSE, CAPILLARY
Glucose-Capillary: 178 mg/dL — ABNORMAL HIGH (ref 70–99)
Glucose-Capillary: 184 mg/dL — ABNORMAL HIGH (ref 70–99)
Glucose-Capillary: 66 mg/dL — ABNORMAL LOW (ref 70–99)
Glucose-Capillary: 203 mg/dL — ABNORMAL HIGH (ref 70–99)

## 2022-07-28 LAB — AEROBIC CULTURE W GRAM STAIN (SUPERFICIAL SPECIMEN)

## 2022-07-28 LAB — PHOSPHORUS: Phosphorus: 3.3 mg/dL (ref 2.5–4.6)

## 2022-07-28 LAB — MAGNESIUM: Magnesium: 2 mg/dL (ref 1.7–2.4)

## 2022-07-28 LAB — VANCOMYCIN, PEAK: Vancomycin Pk: 24 ug/mL — ABNORMAL LOW (ref 30–40)

## 2022-07-28 MED ORDER — HYDROMORPHONE HCL 1 MG/ML IJ SOLN
1.0000 mg | INTRAMUSCULAR | Status: DC | PRN
  Administered 2022-07-28 – 2022-07-30 (×2): 1 mg via INTRAVENOUS
  Filled 2022-07-28 (×2): qty 1

## 2022-07-28 MED ORDER — SODIUM CHLORIDE 0.9 % IV SOLN
2.0000 g | Freq: Three times a day (TID) | INTRAVENOUS | Status: DC
  Administered 2022-07-28 – 2022-07-31 (×10): 2 g via INTRAVENOUS
  Filled 2022-07-28 (×10): qty 12.5

## 2022-07-28 NOTE — Progress Notes (Signed)
Progress Note   Patient: Katelyn Kaufman [id number] DOB: 1951-03-02 DOA: 07/25/2022     2 DOS: the patient was seen and examined on 07/28/2022   Brief hospital course: As per H&P written by Dr.Zierle-Ghosh on 07/25/22  Katelyn Kaufman is a 72 y.o. female with medical history significant of anxiety, coronary artery disease, COPD, depression, hypertension, hyperlipidemia, OSA, type 2 diabetes mellitus, presents the ED with a chief complaint of left breast pain.  Patient reports that she had a mastectomy on June 25, 2022 due to breast cancer.  She has had pain since the surgery but it got acutely worse last night.  She reports she has had golden colored drainage and large amounts since the staples were taken out.  She developed a fever 2 days ago.  She reports a measured temperature of 99.9 but feeling feverish with chills.  She has had some nausea and vomiting for the last 4-5 days.  Any solid food she eats comes right back up.  She had no appetite.  She denies any hematemesis.  She does have some periumbilical abdominal pain that is crampy.  It is worse when she is heaving.  Patient reports that she has infrequent bowel movements at baseline but her last bowel movement was 2 days ago.  Patient has noticed no erythema at the surgical site.  She reports she has not been on any antibiotics recently.  On review of systems she reports some shortness of breath that is chronic.  She is not on any oxygen supplementation at home.  She has had some palpitations and chest pain yesterday.  She took a nitro and they were resolved.  She reports that the palpitations and chest pain were associated with nausea, diaphoresis, dizziness.  The dizziness has been 1 week intermittent.  She has no other complaints at this time.   Patient does smoke.  She request nicotine patch.  Patient does not drink alcohol and does not use illicit drugs.  She is vaccinated for COVID.  Patient is full code.   Assessment and Plan: 1)Staph  and Pseudomonas abscess of right breast----- after recent mastectomy-POA - S/p surgical debridement and deep cultures taken. -Preliminary cultures with Pseudomonas and staph -Continue vancomycin and cefepime pending sensitivities -Continue wound VAC change Monday Wednesday Fridays per general surgeon  Thyromegaly -TSH within normal limits. -Continue outpatient follow-up.  Tobacco use disorder - Smokes half a pack per day - Cessation counseling provided -Continue nicotine patch.  S/P left mastectomy -Continue to follow general surgery recommendations and postoperative management--- please see #1 above  Coronary artery disease - Continue aspirin, metoprolol and adjusted dose of Imdur  -Will also continue the use of Crestor - Currently not demonstrating any complaints that would suggest ACS.  Hyperlipidemia - Continue Zetia and Crestor -Heart healthy diet discussed with patient.  Hypertension - Blood pressure soft most likely, analgesics usage and current situation -Imdur dose has been adjusted; continue metoprolol. -Losartan placed on hold -Follow-up vital signs.  Type 2 diabetes mellitus with complication, with long-term current use of insulin (HCC) -Continue modified carbohydrate diet -Continue to follow CBGs fluctuation and adjust hypoglycemic regimen as needed -Continue sliding scale insulin and long acting.  Morbid obesity: -Body mass index is 40.11 kg/m. -Low-calorie diet, portion control and increase physical activity discussed with patient.  Subjective:  -No new concerns -Tolerated wound VAC change well -No fever  Or chills   Physical Exam: Vitals:   07/27/22 1329 07/27/22 2125 07/28/22 0446 07/28/22 1337  BP: Marland Kitchen)  102/52 119/85 (!) 124/57 100/60  Pulse: 69 77 64 (!) 56  Resp: (!) '21 20 16   '$ Temp: 98.7 F (37.1 C) 99.3 F (37.4 C) 98.6 F (37 C)   TempSrc:  Oral Oral   SpO2: 98% 98% 97%   Weight:      Height:       General exam: Alert, awake,  oriented x 3; in no major distress.  Reporting some chest discomfort around wound VAC and recent surgical intervention.  No fever, decreased appetite consult blood pressure noticed. Respiratory system: Clear to auscultation. Respiratory effort normal.  Good saturation on room air. Cardiovascular system:RRR.  No rubs or gallops. Gastrointestinal system: Abdomen is obese, nondistended, soft and nontender. No organomegaly or masses felt. Normal bowel sounds heard. Central nervous system: Alert and oriented. No focal neurological deficits. Extremities: No Cyanosis or clubbing. Skin: No petechiae; left chest with left mastectomy site demonstrating improvement in erythematous changes and with wound VAC in place.  Patient expressed some pain around surgical area. Psychiatry: Judgement and insight appear normal. Mood & affect appropriate.   Family Communication: No family at bedside.  Disposition: Status is: Inpatient Remains inpatient appropriate because: Continue treatment with IV antibiotics and follow recommendations by general surgery.   Planned Discharge Destination: Home  Time spent: 30 minutes  Author: Roxan Hockey, MD 07/28/2022 4:43 PM  For on call review [email or web address].

## 2022-07-28 NOTE — TOC Progression Note (Signed)
Transition of Care [facility]) - Progression Note    Patient Details  Name: Katelyn Kaufman MRN: [id number] Date of Birth: 04-10-1951  Transition of Care [facility]) CM/SW Contact  Ihor Gully, LCSW Phone Number: 07/28/2022, 3:21 PM  Clinical Narrative:    Discussed SNF for rehab and possibly IV abx. Patient states that she wants to d/c with [facility] services. Advises that she wants to she will have one of her grandsons Science writer or [place]) will assist with IV abx.    Expected Discharge Plan: [place] Barriers to Discharge: Continued Medical Work up  Expected Discharge Plan and Services In-house Referral: Clinical Social Work   Post Acute Care Choice: [place] Living arrangements for the past 2 months: Apartment                                       Social Determinants of Health (SDOH) Interventions SDOH Screenings   Food Insecurity: Food Insecurity Present (07/25/2022)  Housing: Medium Risk (07/25/2022)  Transportation Needs: No Transportation Needs (07/25/2022)  Utilities: Not At Risk (07/25/2022)  Tobacco Use: High Risk (07/26/2022)    Readmission Risk Interventions    11/23/2021    9:39 AM  Readmission Risk Prevention Plan  Post Dischage Appt Complete  Medication Screening Complete  Transportation Screening Complete

## 2022-07-28 NOTE — Progress Notes (Signed)
Physical Therapy Treatment Patient Details Name: Katelyn Kaufman MRN: [id number] DOB: 01/07/1951 Today's Date: 07/28/2022   History of Present Illness Katelyn Kaufman is a 72 y.o. female with medical history significant of anxiety, coronary artery disease, COPD, depression, hypertension, hyperlipidemia, OSA, type 2 diabetes mellitus, presents the ED with a chief complaint of left breast pain.  Patient reports that she had a mastectomy on June 25, 2022 due to breast cancer.  She has had pain since the surgery but it got acutely worse last night.  She reports she has had golden colored drainage and large amounts since the staples were taken out.  She developed a fever 2 days ago.  She reports a measured temperature of 99.9 but feeling feverish with chills.  She has had some nausea and vomiting for the last 4-5 days.  Any solid food she eats comes right back up.  She had no appetite.  She denies any hematemesis.  She does have some periumbilical abdominal pain that is crampy.  It is worse when she is heaving.  Patient reports that she has infrequent bowel movements at baseline but her last bowel movement was 2 days ago.  Patient has noticed no erythema at the surgical site.  She reports she has not been on any antibiotics recently.  On review of systems she reports some shortness of breath that is chronic.  She is not on any oxygen supplementation at home.  She has had some palpitations and chest pain yesterday.  She took a nitro and they were resolved.  She reports that the palpitations and chest pain were associated with nausea, diaphoresis, dizziness.  The dizziness has been 1 week intermittent.  She has no other complaints at this time.    PT Comments    Patient presents sitting upright in bed with wound vac affixed. Patient is awake but somewhat lethargic initially. Patient unable to give numeric pain rating but gestures to LT side noting that it is sore. She also notes that she has intermittent  issues with sciatica. Patient is able to perform bed mobility with set up assist. She shows good balance sitting at bedside. Patient performs seated LE strengthening activity as noted in flowsheet with no increased complaint of pain. Patient performs sit to stand x 3 from bedside using RW and CG assist. Patient demos good stability standing at bedside using RW for UE support. Patient is able to take a few small steps at bedside using RW and performs standing marches with good stability. Practiced standing balance with patient able to tolerate 3-4 minutes before needing rest due to fatigue. Patient returned to bed with set up assist for reposition to center of bed. Patient left with phone and call bell in reach. Patient will benefit from continued physical therapy in hospital and recommended venue below to increase strength, balance, endurance for safe ADLs and gait.      Recommendations for follow up therapy are one component of a multi-disciplinary discharge planning process, led by the attending physician.  Recommendations may be updated based on patient status, additional functional criteria and insurance authorization.  Follow Up Recommendations  Skilled nursing-short term rehab (<3 hours/day) Can patient physically be transported by private vehicle: Yes   Assistance Recommended at Discharge Frequent or constant Supervision/Assistance  Patient can return home with the following A little help with bathing/dressing/bathroom;Help with stairs or ramp for entrance;Assistance with cooking/housework;A little help with walking and/or transfers   Equipment Recommendations  None recommended by PT  Recommendations for Other Services       Precautions / Restrictions       Mobility  Bed Mobility Overal bed mobility: Needs Assistance Bed Mobility: Supine to Sit     Supine to sit: Min guard, HOB elevated     General bed mobility comments: increased time, labored movement with HOB partially  raised    Transfers Overall transfer level: Needs assistance Equipment used: Rolling walker (2 wheels) Transfers: Sit to/from Stand Sit to Stand: Supervision                Ambulation/Gait                   Stairs             Wheelchair Mobility    Modified Rankin (Stroke Patients Only)       Balance Overall balance assessment: Needs assistance Sitting-balance support: Feet supported, No upper extremity supported Sitting balance-Leahy Scale: Fair Sitting balance - Comments: fair/good seated at EOB   Standing balance support: During functional activity, Bilateral upper extremity supported Standing balance-Leahy Scale: Fair Standing balance comment: using RW                            Cognition Arousal/Alertness: Awake/alert Behavior During Therapy: WFL for tasks assessed/performed Overall Cognitive Status: Within Functional Limits for tasks assessed                                          Exercises General Exercises - Lower Extremity Long Arc Quad: Both, Seated, 20 reps Hip Flexion/Marching: Strengthening, Seated, Standing, Both, 20 reps    General Comments        Pertinent Vitals/Pain Pain Assessment Pain Assessment: Faces Faces Pain Scale: Hurts a little bit Pain Location: left side chest wall Pain Descriptors / Indicators: Discomfort, Grimacing, Guarding, Sore Pain Intervention(s): Limited activity within patient's tolerance, Monitored during session    Home Living                          Prior Function            PT Goals (current goals can now be found in the care plan section) Acute Rehab PT Goals Patient Stated Goal: return home after rehab PT Goal Formulation: With patient/family Time For Goal Achievement: 08/10/22 Potential to Achieve Goals: Good Progress towards PT goals: Progressing toward goals    Frequency    Min 3X/week      PT Plan Current plan remains appropriate     Co-evaluation              AM-PAC PT "6 Clicks" Mobility   Outcome Measure  Help needed turning from your back to your side while in a flat bed without using bedrails?: A Little Help needed moving from lying on your back to sitting on the side of a flat bed without using bedrails?: A Little Help needed moving to and from a bed to a chair (including a wheelchair)?: A Little Help needed standing up from a chair using your arms (e.g., wheelchair or bedside chair)?: A Little Help needed to walk in hospital room?: A Little Help needed climbing 3-5 steps with a railing? : A Lot 6 Click Score: 17    End of Session   Activity Tolerance: Patient tolerated treatment  well;Patient limited by fatigue Patient left: with call bell/phone within reach;in bed;with nursing/sitter in room Nurse Communication: Mobility status PT Visit Diagnosis: Unsteadiness on feet (R26.81);Other abnormalities of gait and mobility (R26.89);Muscle weakness (generalized) (M62.81)     Time: [id number] PT Time Calculation (min) (ACUTE ONLY): 26 min  Charges:  $Therapeutic Exercise: 8-22 mins $Therapeutic Activity: 8-22 mins              5:03 PM, 07/28/22 Josue Hector PT DPT  Physical Therapist with [facility]  [phone]

## 2022-07-28 NOTE — Progress Notes (Signed)
Pharmacy Antibiotic Note  JOCABED BOSTON is a 72 y.o. female admitted on 07/25/2022 with  surgical wound drainage .  Pharmacy has been consulted for vanc dosing.  Plan: Vanco 2000 mg IV x 1 Vanc '750mg'$  IV q12h Vanco levels ordered  Cefepime 2000 mg IV every 8 hours.   Height: '5\' 4"'$  (162.6 cm) Weight: 106 kg (233 lb 11 oz) IBW/kg (Calculated) : 54.7  Temp (24hrs), Avg:98.9 F (37.2 C), Min:98.6 F (37 C), Max:99.3 F (37.4 C)  Recent Labs  Lab 07/25/22 1715 07/25/22 1845 07/26/22 0458 07/27/22 0438 07/28/22 0551  WBC 6.6  --  6.3 7.5 6.7  CREATININE 1.11*  --  1.06* 1.14* 0.94  LATICACIDVEN 1.4 1.2  --   --   --      Estimated Creatinine Clearance: 65.2 mL/min (by C-G formula based on SCr of 0.94 mg/dL).    Allergies  Allergen Reactions   Latex Rash     Cefepime 3/6 >> Vanc 3/3>> CTX 3/5     Microbiology results: 3/3 Bcx: ngtd 3/4 Breast Wound Cx: final MRSA, pseudomonas , proteus (resistant to cipro)>   Margot Ables, PharmD Clinical Pharmacist 07/28/2022 12:27 PM

## 2022-07-28 NOTE — Progress Notes (Signed)
Wound VAC changed at bedside.  A smaller black sponge was used.  Intraoperative cultures notable for Pseudomonas and staph.  Sensitivities pending.  Patient will need a SNF for management of her wound VAC.  Sensitivities are pending as to whether the patient will need IV antibiotics.  Discussed with Dr. Denton Brick.

## 2022-07-28 NOTE — Anesthesia Postprocedure Evaluation (Signed)
Anesthesia Post Note  Patient: Katelyn Kaufman  Procedure(s) Performed: EVACUATION OF HEMATOMA BREAST, DEBRIDEMENT OF LEFT BREAST WOUND (Left: Breast)  Patient location during evaluation: Phase II Anesthesia Type: General Level of consciousness: awake Pain management: pain level controlled Vital Signs Assessment: post-procedure vital signs reviewed and stable Respiratory status: spontaneous breathing and respiratory function stable Cardiovascular status: blood pressure returned to baseline and stable Postop Assessment: no headache and no apparent nausea or vomiting Anesthetic complications: no Comments: Late entry   No notable events documented.   Last Vitals:  Vitals:   07/27/22 2125 07/28/22 0446  BP: 119/85 (!) 124/57  Pulse: 77 64  Resp: 20 16  Temp: 37.4 C 37 C  SpO2: 98% 97%    Last Pain:  Vitals:   07/28/22 0446  TempSrc: Oral  PainSc:                  Louann Sjogren

## 2022-07-29 DIAGNOSIS — T8149XA Infection following a procedure, other surgical site, initial encounter: Secondary | ICD-10-CM | POA: Diagnosis not present

## 2022-07-29 LAB — GLUCOSE, CAPILLARY
Glucose-Capillary: 126 mg/dL — ABNORMAL HIGH (ref 70–99)
Glucose-Capillary: 164 mg/dL — ABNORMAL HIGH (ref 70–99)
Glucose-Capillary: 142 mg/dL — ABNORMAL HIGH (ref 70–99)
Glucose-Capillary: 170 mg/dL — ABNORMAL HIGH (ref 70–99)

## 2022-07-29 LAB — AEROBIC/ANAEROBIC CULTURE W GRAM STAIN (SURGICAL/DEEP WOUND): Gram Stain: NONE SEEN

## 2022-07-29 LAB — VANCOMYCIN, TROUGH: Vancomycin Tr: 14 ug/mL — ABNORMAL LOW (ref 15–20)

## 2022-07-29 MED ORDER — UMECLIDINIUM BROMIDE 62.5 MCG/ACT IN AEPB
1.0000 | INHALATION_SPRAY | Freq: Every day | RESPIRATORY_TRACT | Status: DC
  Administered 2022-07-30 – 2022-07-31 (×2): 1 via RESPIRATORY_TRACT
  Filled 2022-07-29: qty 7

## 2022-07-29 MED ORDER — TIOTROPIUM BROMIDE MONOHYDRATE 18 MCG IN CAPS: 18.0000 ug | ORAL_CAPSULE | Freq: Every day | RESPIRATORY_TRACT | Status: DC

## 2022-07-29 MED ORDER — ALBUTEROL SULFATE (2.5 MG/3ML) 0.083% IN NEBU: 2.5000 mg | INHALATION_SOLUTION | RESPIRATORY_TRACT | Status: DC | PRN

## 2022-07-29 MED ORDER — ALBUTEROL SULFATE (2.5 MG/3ML) 0.083% IN NEBU
2.5000 mg | INHALATION_SOLUTION | Freq: Four times a day (QID) | RESPIRATORY_TRACT | Status: DC | PRN
  Administered 2022-07-30: 2.5 mg via RESPIRATORY_TRACT
  Filled 2022-07-29: qty 3

## 2022-07-29 NOTE — Progress Notes (Signed)
Wound VAC working appropriately.  Sensitivities of the bacteria pending.  Patient will need skilled nursing upon discharge for wound care.  May need follow-up with plastic surgery as an outpatient depending on how the wound heals.

## 2022-07-29 NOTE — Progress Notes (Signed)
Progress Note   Patient: Katelyn Kaufman [id number] DOB: August 27, 1950 DOA: 07/25/2022     3 DOS: the patient was seen and examined on 07/29/2022   Brief hospital course: As per H&P written by Dr.Zierle-Ghosh on 07/25/22  ELLYCE VESPER is a 72 y.o. female with medical history significant of anxiety, coronary artery disease, COPD, depression, hypertension, hyperlipidemia, OSA, type 2 diabetes mellitus, presents the ED with a chief complaint of left breast pain.  Patient reports that she had a mastectomy on June 25, 2022 due to breast cancer.  She has had pain since the surgery but it got acutely worse last night.  She reports she has had golden colored drainage and large amounts since the staples were taken out.  She developed a fever 2 days ago.  She reports a measured temperature of 99.9 but feeling feverish with chills.  She has had some nausea and vomiting for the last 4-5 days.  Any solid food she eats comes right back up.  She had no appetite.  She denies any hematemesis.  She does have some periumbilical abdominal pain that is crampy.  It is worse when she is heaving.  Patient reports that she has infrequent bowel movements at baseline but her last bowel movement was 2 days ago.  Patient has noticed no erythema at the surgical site.  She reports she has not been on any antibiotics recently.  On review of systems she reports some shortness of breath that is chronic.  She is not on any oxygen supplementation at home.  She has had some palpitations and chest pain yesterday.  She took a nitro and they were resolved.  She reports that the palpitations and chest pain were associated with nausea, diaphoresis, dizziness.  The dizziness has been 1 week intermittent.  She has no other complaints at this time.   Patient does smoke.  She request nicotine patch.  Patient does not drink alcohol and does not use illicit drugs.  She is vaccinated for COVID.  Patient is full code.   Assessment and Plan: 1)MRSA ,  Proteus and Pseudomonas Abscess of Right Breast----- after recent mastectomy-POA - S/p surgical debridement and deep cultures taken. Microbiology results: 3/3 Bcx: ngtd 3//3/24 and repeat wound cx from 07/26/22-Breast Wound Cx: with MRSA, Pseudomonas - pan S Proteus (resistant to cipro, amp, bactrim, I to zosyn) -Continue IV vancomycin and IV cefepime  -Anticipate discharge to SNF on iv Cefepime 2 gm q 8 hours for 14 days from 07/26/2022 and oral zyvox 600 mg bid for 4 weeks from 07/26/2022 -- she will continue to need wound Vac changes MWF.......at SNF    2)Thyromegaly -TSH within normal limits. -Continue outpatient follow-up.  3)Tobacco use disorder/COPD - -No acute exacerbation, continue nicotine patch and bronchodilators  4)S/P left mastectomy -Continue to follow general surgery recommendations and postoperative management--- -- please see #1 above  5)Coronary artery disease - Continue aspirin, metoprolol, Crestor and adjusted dose of Imdur  -Remains chest pain-free  6)HTN- Losartan on hold, isosorbide dose reduced -Continue metoprolol  7)Type 2 diabetes mellitus with complication, with long-term current use of insulin (HCC) -Continue Lantus 25 units nightly -Use Novolog/Humalog Sliding scale insulin with Accu-Cheks/Fingersticks as ordered   Morbid obesity: -Body mass index is 40.11 kg/m. -Low-calorie diet, portion control and increase physical activity discussed with patient.   Subjective:  -Patient's older sister is at bedside No fevers no nausea  Physical Exam: Vitals:   07/28/22 1337 07/28/22 2100 07/29/22 0519 07/29/22 1305  BP: 100/60  126/75 117/70 (!) 114/48  Pulse: (!) 56 77 (!) 54 67  Resp:  19 18   Temp:  99.5 F (37.5 C) 97.7 F (36.5 C) 97.6 F (36.4 C)  TempSrc:  Oral Oral Oral  SpO2:  96% 100% 100%  Weight:      Height:        Physical Exam Gen:- Awake Alert, in no acute distress, morbidly obese HEENT:- Chicken.AT, No sclera icterus Neck-Supple  Neck,No JVD,.  Lungs-  CTAB , fair air movement bilaterally  Breast--left breast wall wound with wound VAC in situ CV- S1, S2 normal, RRR Abd-  +ve B.Sounds, Abd Soft, No tenderness,    Extremity/Skin:- No  edema,   good pedal pulses  Psych-affect is appropriate, oriented x3 Neuro-generalized weakness, no new focal deficits, no tremors   Family Communication: Discussed with older sister at bedside  Disposition: SNF rehab Status is: Inpatient Remains inpatient appropriate because: Continue treatment with IV antibiotics and follow recommendations by general surgery.   Planned Discharge Destination: Home  Time spent: 30 minutes  Author: Roxan Hockey, MD 07/29/2022 2:13 PM  For on call review [email or web address].

## 2022-07-29 NOTE — TOC Progression Note (Signed)
Transition of Care [facility]) - Progression Note    Patient Details  Name: Katelyn Kaufman MRN: [id number] Date of Birth: [birth year]  [facility]) CM/SW Contact  Ihor Gully, LCSW Phone Number: 07/29/2022, 3:21 PM  Clinical Narrative:    Ebony Hail with [facility] confirms authorization. She will order wound vac in preparation for patient to admit tomorrow.    Expected Discharge Plan: [place] Barriers to Discharge: Continued Medical Work up  Expected Discharge Plan and Services In-house Referral: Clinical Social Work   Post Acute Care Choice: [place] Living arrangements for the past 2 months: Apartment                                       Social Determinants of Health (SDOH) Interventions SDOH Screenings   Food Insecurity: Food Insecurity Present (07/25/2022)  Housing: Medium Risk (07/25/2022)  Transportation Needs: No Transportation Needs (07/25/2022)  Utilities: Not At Risk (07/25/2022)  Tobacco Use: High Risk (07/26/2022)    Readmission Risk Interventions    11/23/2021    9:39 AM  Readmission Risk Prevention Plan  Post Dischage Appt Complete  Medication Screening Complete  Transportation Screening Complete

## 2022-07-29 NOTE — Progress Notes (Signed)
Physical Therapy Treatment Patient Details Name: JAKLYNN JANOTA MRN: [id number] DOB: [birth year] Today's Date: 07/29/2022   History of Present Illness SHERIAL DYAS is a 72 y.o. female with medical history significant of anxiety, coronary artery disease, COPD, depression, hypertension, hyperlipidemia, OSA, type 2 diabetes mellitus, presents the ED with a chief complaint of left breast pain.  Patient reports that she had a mastectomy on June 25, 2022 due to breast cancer.  She has had pain since the surgery but it got acutely worse last night.  She reports she has had golden colored drainage and large amounts since the staples were taken out.  She developed a fever 2 days ago.  She reports a measured temperature of 99.9 but feeling feverish with chills.  She has had some nausea and vomiting for the last 4-5 days.  Any solid food she eats comes right back up.  She had no appetite.  She denies any hematemesis.  She does have some periumbilical abdominal pain that is crampy.  It is worse when she is heaving.  Patient reports that she has infrequent bowel movements at baseline but her last bowel movement was 2 days ago.  Patient has noticed no erythema at the surgical site.  She reports she has not been on any antibiotics recently.  On review of systems she reports some shortness of breath that is chronic.  She is not on any oxygen supplementation at home.  She has had some palpitations and chest pain yesterday.  She took a nitro and they were resolved.  She reports that the palpitations and chest pain were associated with nausea, diaphoresis, dizziness.  The dizziness has been 1 week intermittent.  She has no other complaints at this time.    PT Comments    Patient demonstrates slightly increased endurance/distance for gait training with slow labored movement without loss of balance, but limited mostly due to c/o fatigue.  Patient tolerated sitting up in chair after therapy.  Patient will benefit from  continued skilled physical therapy in hospital and recommended venue below to increase strength, balance, endurance for safe ADLs and gait.     Recommendations for follow up therapy are one component of a multi-disciplinary discharge planning process, led by the attending physician.  Recommendations may be updated based on patient status, additional functional criteria and insurance authorization.  Follow Up Recommendations  Skilled nursing-short term rehab (<3 hours/day) Can patient physically be transported by private vehicle: Yes   Assistance Recommended at Discharge Set up Supervision/Assistance  Patient can return home with the following A little help with bathing/dressing/bathroom;Help with stairs or ramp for entrance;Assistance with cooking/housework;A little help with walking and/or transfers   Equipment Recommendations  None recommended by PT    Recommendations for Other Services       Precautions / Restrictions Precautions Precautions: Fall Restrictions Weight Bearing Restrictions: No     Mobility  Bed Mobility Overal bed mobility: Needs Assistance Bed Mobility: Supine to Sit     Supine to sit: Min guard     General bed mobility comments: slow labored movement with increased pain left side chest wall    Transfers Overall transfer level: Needs assistance Equipment used: Rolling walker (2 wheels) Transfers: Sit to/from Stand, Bed to chair/wheelchair/BSC Sit to Stand: Supervision   Step pivot transfers: Min guard, Min assist       General transfer comment: slow labored movement    Ambulation/Gait Ambulation/Gait assistance: Min assist Gait Distance (Feet): 35 Feet Assistive device: Rolling walker (2  wheels) Gait Pattern/deviations: Decreased step length - right, Decreased step length - left, Decreased stride length, Trunk flexed Gait velocity: decreased     General Gait Details: increased endurance/distance with slow labored movement requiring increased  time to make turns and limited mostly due to fatigue and left chest wall discomfort   Stairs             Wheelchair Mobility    Modified Rankin (Stroke Patients Only)       Balance Overall balance assessment: Needs assistance Sitting-balance support: Feet supported, No upper extremity supported Sitting balance-Leahy Scale: Fair Sitting balance - Comments: fair/good seated at EOB   Standing balance support: During functional activity, Bilateral upper extremity supported Standing balance-Leahy Scale: Fair Standing balance comment: using RW                            Cognition Arousal/Alertness: Awake/alert Behavior During Therapy: WFL for tasks assessed/performed Overall Cognitive Status: Within Functional Limits for tasks assessed                                          Exercises General Exercises - Lower Extremity Long Arc Quad: Seated, AROM, Strengthening, Both, 10 reps Hip Flexion/Marching: Seated, AROM, Strengthening, Both, 10 reps Toe Raises: Seated, AROM, Strengthening, Both, 10 reps Heel Raises: Seated, AROM, Strengthening, Both, 10 reps    General Comments        Pertinent Vitals/Pain Pain Assessment Pain Assessment: Faces Faces Pain Scale: Hurts a little bit Pain Location: left side chest wall Pain Descriptors / Indicators: Discomfort, Grimacing, Guarding, Sore Pain Intervention(s): Limited activity within patient's tolerance, Monitored during session, Repositioned    Home Living                          Prior Function            PT Goals (current goals can now be found in the care plan section) Acute Rehab PT Goals Patient Stated Goal: return home after rehab PT Goal Formulation: With patient Time For Goal Achievement: 08/10/22 Potential to Achieve Goals: Good Progress towards PT goals: Progressing toward goals    Frequency    Min 3X/week      PT Plan Current plan remains appropriate     Co-evaluation              AM-PAC PT "6 Clicks" Mobility   Outcome Measure  Help needed turning from your back to your side while in a flat bed without using bedrails?: A Little Help needed moving from lying on your back to sitting on the side of a flat bed without using bedrails?: A Little Help needed moving to and from a bed to a chair (including a wheelchair)?: A Little Help needed standing up from a chair using your arms (e.g., wheelchair or bedside chair)?: A Little Help needed to walk in hospital room?: A Lot Help needed climbing 3-5 steps with a railing? : A Lot 6 Click Score: 16    End of Session   Activity Tolerance: Patient tolerated treatment well;Patient limited by fatigue Patient left: with call bell/phone within reach;in bed;with nursing/sitter in room Nurse Communication: Mobility status PT Visit Diagnosis: Unsteadiness on feet (R26.81);Other abnormalities of gait and mobility (R26.89);Muscle weakness (generalized) (M62.81)     Time: [id number] PT Time Calculation (min) (  ACUTE ONLY): 30 min  Charges:  $Gait Training: 8-22 mins $Therapeutic Exercise: 8-22 mins                     2:01 PM, 07/29/22 Lonell Grandchild, MPT Physical Therapist with [facility] [phone] office [phone] mobile phone

## 2022-07-29 NOTE — Progress Notes (Addendum)
Pharmacy Antibiotic Note  Katelyn Kaufman is a 72 y.o. female admitted on 07/25/2022 with  surgical wound drainage . Pharmacy has been consulted for vanc dosing.  Patient with tmax of 99.5, wbc normal. Scr stable at 0.9.   Vancomycin peak 24 Vancomycin trough 14 Calculated AUC: 467 Ke 0.06, t 1/2 11.4  Plan: Discussed patient case with ID at this time would continue with vancomycin and cefepime -Will need to follow up finalized cultures to make sure no anaerobes grow Continue Vancomycin '750mg'$  IV q12h -Could change to po linezolid 600 mg bid - ID suggests 4 wks of therapy from 07/26/22 Cefepime 2g IV every 8 hours   Height: '5\' 4"'$  (162.6 cm) Weight: 106 kg (233 lb 11 oz) IBW/kg (Calculated) : 54.7  Temp (24hrs), Avg:98.6 F (37 C), Min:97.7 F (36.5 C), Max:99.5 F (37.5 C)  Recent Labs  Lab 07/25/22 1715 07/25/22 1845 07/26/22 0458 07/27/22 0438 07/28/22 0551 07/28/22 1839 07/29/22 0329  WBC 6.6  --  6.3 7.5 6.7  --   --   CREATININE 1.11*  --  1.06* 1.14* 0.94  --   --   LATICACIDVEN 1.4 1.2  --   --   --   --   --   VANCOTROUGH  --   --   --   --   --   --  14*  VANCOPEAK  --   --   --   --   --  24*  --      Estimated Creatinine Clearance: 65.2 mL/min (by C-G formula based on SCr of 0.94 mg/dL).    Allergies  Allergen Reactions   Latex Rash   Cefepime 3/6 >> Vanc 3/3>> CTX 3/5   Microbiology results: 3/3 Bcx: ngtd 3/4 Breast Wound Cx: final MRSA,  Pseudomonas - pan S Proteus (resistant to cipro, amp, bactrim, I to zosyn)  Erin Hearing PharmD., BCPS Clinical Pharmacist 07/29/2022 7:48 AM

## 2022-07-30 ENCOUNTER — Inpatient Hospital Stay: Payer: Self-pay

## 2022-07-30 DIAGNOSIS — T8149XA Infection following a procedure, other surgical site, initial encounter: Secondary | ICD-10-CM | POA: Diagnosis not present

## 2022-07-30 LAB — GLUCOSE, CAPILLARY
Glucose-Capillary: 112 mg/dL — ABNORMAL HIGH (ref 70–99)
Glucose-Capillary: 173 mg/dL — ABNORMAL HIGH (ref 70–99)
Glucose-Capillary: 169 mg/dL — ABNORMAL HIGH (ref 70–99)
Glucose-Capillary: 172 mg/dL — ABNORMAL HIGH (ref 70–99)

## 2022-07-30 LAB — CULTURE, BLOOD (ROUTINE X 2)
Culture: NO GROWTH
Culture: NO GROWTH
Special Requests: ADEQUATE
Special Requests: ADEQUATE

## 2022-07-30 LAB — CBC
HCT: 30.7 % — ABNORMAL LOW (ref 36.0–46.0)
Hemoglobin: 10.1 g/dL — ABNORMAL LOW (ref 12.0–15.0)
MCH: 31.2 pg (ref 26.0–34.0)
MCHC: 32.9 g/dL (ref 30.0–36.0)
MCV: 94.8 fL (ref 80.0–100.0)
Platelets: 249 10*3/uL (ref 150–400)
RBC: 3.24 MIL/uL — ABNORMAL LOW (ref 3.87–5.11)
RDW: 13.2 % (ref 11.5–15.5)
WBC: 6.5 10*3/uL (ref 4.0–10.5)
nRBC: 0 % (ref 0.0–0.2)

## 2022-07-30 LAB — BASIC METABOLIC PANEL
Anion gap: 9 (ref 5–15)
BUN: 10 mg/dL (ref 8–23)
CO2: 23 mmol/L (ref 22–32)
Calcium: 10 mg/dL (ref 8.9–10.3)
Chloride: 104 mmol/L (ref 98–111)
Creatinine, Ser: 0.99 mg/dL (ref 0.44–1.00)
GFR, Estimated: >60 mL/min (ref >60)
Glucose, Bld: 120 mg/dL — ABNORMAL HIGH (ref 70–99)
Potassium: 4 mmol/L (ref 3.5–5.1)
Sodium: 136 mmol/L (ref 135–145)

## 2022-07-30 MED ORDER — METRONIDAZOLE 500 MG PO TABS: 500.0000 mg | ORAL_TABLET | Freq: Two times a day (BID) | ORAL | 0 refills | Status: AC

## 2022-07-30 MED ORDER — ISOSORBIDE MONONITRATE ER 30 MG PO TB24: 30.0000 mg | ORAL_TABLET | Freq: Every day | ORAL | 2 refills | Status: DC

## 2022-07-30 MED ORDER — NICOTINE 21 MG/24HR TD PT24: 21.0000 mg | MEDICATED_PATCH | Freq: Every day | TRANSDERMAL | 0 refills | Status: DC

## 2022-07-30 MED ORDER — SODIUM CHLORIDE 0.9% FLUSH: 10.0000 mL | INTRAVENOUS | Status: DC | PRN

## 2022-07-30 MED ORDER — SODIUM CHLORIDE 0.9% FLUSH
10.0000 mL | Freq: Two times a day (BID) | INTRAVENOUS | Status: DC
  Administered 2022-07-30 – 2022-07-31 (×2): 10 mL

## 2022-07-30 MED ORDER — METRONIDAZOLE 500 MG PO TABS
500.0000 mg | ORAL_TABLET | Freq: Two times a day (BID) | ORAL | Status: DC
  Administered 2022-07-30 – 2022-07-31 (×3): 500 mg via ORAL
  Filled 2022-07-30 (×3): qty 1

## 2022-07-30 MED ORDER — VANCOMYCIN IV (FOR PTA / DISCHARGE USE ONLY): 750.0000 mg | Freq: Two times a day (BID) | INTRAVENOUS | 0 refills | Status: AC

## 2022-07-30 MED ORDER — ACETAMINOPHEN 325 MG PO TABS: 650.0000 mg | ORAL_TABLET | Freq: Four times a day (QID) | ORAL | 2 refills | Status: DC | PRN

## 2022-07-30 MED ORDER — CHLORHEXIDINE GLUCONATE CLOTH 2 % EX PADS
6.0000 | MEDICATED_PAD | Freq: Every day | CUTANEOUS | Status: DC
  Administered 2022-07-30 – 2022-07-31 (×2): 6 via TOPICAL

## 2022-07-30 MED ORDER — LANTUS SOLOSTAR 100 UNIT/ML ~~LOC~~ SOPN: 25.0000 [IU] | PEN_INJECTOR | Freq: Every day | SUBCUTANEOUS | 11 refills | Status: DC

## 2022-07-30 MED ORDER — CEFEPIME IV (FOR PTA / DISCHARGE USE ONLY): 2.0000 g | Freq: Three times a day (TID) | INTRAVENOUS | 0 refills | Status: AC

## 2022-07-30 MED ORDER — OXYCODONE-ACETAMINOPHEN 10-325 MG PO TABS: 1.0000 | ORAL_TABLET | ORAL | 0 refills | Status: DC | PRN

## 2022-07-30 NOTE — Care Management Important Message (Signed)
Important Message  Patient Details  Name: Katelyn Kaufman MRN: [id number] Date of Birth: August 27, 1950   Medicare Important Message Given:  Yes     Tommy Medal 07/30/2022, 12:03 PM

## 2022-07-30 NOTE — Progress Notes (Addendum)
PHARMACY CONSULT NOTE FOR:  OUTPATIENT  PARENTERAL ANTIBIOTIC THERAPY (OPAT)  Informational as noted plans to discharge to SNF  Indication: Polymicrobial R-breast abscess Regimen: Vancomycin 750 mg IV every 12 hours + Cefepime 2g IV every 8 hours + oral Flagyl 500 mg po every 12 hours End date: 08/16/22  IV antibiotic discharge orders are pended. To discharging provider:  please sign these orders via discharge navigator,  Select New Orders & click on the button choice - Manage This Unsigned Work.    Thank you for allowing pharmacy to be a part of this patient's care.  Alycia Rossetti, PharmD, BCPS Infectious Diseases Clinical Pharmacist 07/30/2022 11:40 AM   **Pharmacist phone directory can now be found on [email or web address] ([email or web address]).  Listed under [place].

## 2022-07-30 NOTE — Progress Notes (Signed)
Wound vac removed from left breast. Two large pieces of black foam were removed from open breast wound after applying sterile saline to loosen foam. Wound inspected by two nurses to verify all wound foam removed. Wound bed with pink granulation tissue. Tunneling noted at 11:30 position and 4:30 position. Slough noted along wound edge at 6:30 position. Wound irrigated with sterile saline then packed with one full roll of saline soaked kerlix, ensuring gauze inserted into areas of tunneling. Wound filled with saline soaked kerlix, secured with mepilex foam dressing. Also noted pt to have nickle-sized stage 2 wound under left breast at 1730 position. Pink wound base with slough around edges. Wound cleaned and dressed with wet to dry 2x2 guaze dressing and secured with mepilex foam. Pt did require Dilaudid '1mg'$  IV to administered for pain control during wound vac foam removal.

## 2022-07-30 NOTE — Discharge Summary (Signed)
Katelyn Kaufman, is a 72 y.o. female  DOB Feb 03, 1951  MRN [id number].  Admission date:  07/25/2022  Admitting Physician  Rolla Plate, DO  Discharge Date:  07/30/2022   Primary MD  Luciano Cutter, DO  Recommendations for primary care physician for things to follow:   1)Please give IV vancomycin, IV cefepime and p.o. metronidazole through 08/16/2022 as ordered for  Polymicrobial Bacterial Infection of  R-breast wound -Last Day of Therapy:  08/15/12 Labs - Once weekly:  CBC/D and BMP, Labs - Every other week:  ESR and CR  2)follow up with general surgeon Dr. Arnoldo Morale in about 4 weeks  3) continue wound VAC change Mondays Wednesdays and Fridays at [facility] facility  Admission Diagnosis  Post-operative wound abscess [T81.49XA] Abscess after procedure [T81.49XA]   Discharge Diagnosis  Post-operative wound abscess [T81.49XA] Abscess after procedure [T81.49XA]    Principal Problem:   Abscess after procedure Active Problems:   Type 2 diabetes mellitus with complication, with long-term current use of insulin (HCC)   Hypertension   Hyperlipidemia   Coronary artery disease   S/P left mastectomy   Tobacco use disorder   Thyromegaly   Hematoma of left breast      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   COPD (chronic obstructive pulmonary disease) ([place])    Depression    Essential hypertension    Hyperlipidemia    Myocardial infarction ([place])    [place] interventions to the LAD   OSA (obstructive sleep apnea)    CPAP qHS   PONV (postoperative nausea and vomiting)    Type 2 diabetes mellitus ([place])     Past Surgical History:  Procedure Laterality Date   BACK SURGERY      BREAST SURGERY     CATARACT EXTRACTION W/PHACO Right 10/24/2020   Procedure: CATARACT EXTRACTION PHACO AND INTRAOCULAR LENS PLACEMENT (IOC);  Surgeon: Baruch Goldmann, MD;  Location: [facility];  Service: Ophthalmology;  Laterality: Right;  CDE: 10.87   CATARACT EXTRACTION W/PHACO Left 12/01/2020   Procedure: CATARACT EXTRACTION PHACO AND INTRAOCULAR LENS PLACEMENT LEFT EYE;  Surgeon: Baruch Goldmann, MD;  Location: [facility];  Service: Ophthalmology;  Laterality: Left;  CDE  8.62   CORONARY ARTERY BYPASS GRAFT N/A 11/16/2021   Procedure: CORONARY ARTERY BYPASS GRAFTING (CABG) times three using the left internal mammary and right saphenous vein.;  Surgeon: Gaye Pollack, MD;  Location: [facility];  Service: Open Heart Surgery;  Laterality: N/A;   IABP INSERTION N/A 11/16/2021   Procedure: IABP Insertion;  Surgeon: Leonie Man, MD;  Location: [facility];  Service: Cardiovascular;  Laterality: N/A;   LEFT HEART CATH AND CORONARY ANGIOGRAPHY N/A 11/16/2021   Procedure: LEFT HEART CATH AND CORONARY ANGIOGRAPHY;  Surgeon: Leonie Man, MD;  Location: [facility];  Service: Cardiovascular;  Laterality: N/A;   REPLACEMENT TOTAL KNEE Right    SIMPLE MASTECTOMY WITH AXILLARY SENTINEL NODE BIOPSY Left 06/25/2022   Procedure: SIMPLE MASTECTOMY;  Surgeon: Aviva Signs, MD;  Location: [facility];  Service: General;  Laterality: Left;   TEE WITHOUT CARDIOVERSION N/A 11/16/2021   Procedure: TRANSESOPHAGEAL ECHOCARDIOGRAM (TEE);  Surgeon: Gaye Pollack, MD;  Location: [facility];  Service: Open Heart Surgery;  Laterality: N/A;   TUBAL LIGATION      HPI  from the history and physical done on the day of admission:   HPI: Katelyn Kaufman is a 72 y.o. female with medical history significant of anxiety, coronary artery disease, COPD, depression, hypertension, hyperlipidemia, OSA, type 2 diabetes mellitus, presents the ED with a chief complaint of left breast pain.  Patient reports that she had a mastectomy on  June 25, 2022 due to breast cancer.  She has had pain since the surgery but it got acutely worse last night.  She reports she has had golden colored drainage and large amounts since the staples were taken out.  She developed a fever 2 days ago.  She reports a measured temperature of 99.9 but feeling feverish with chills.  She has had some nausea and vomiting for the last 4-5 days.  Any solid food she eats comes right back up.  She had no appetite.  She denies any hematemesis.  She does have some periumbilical abdominal pain that is crampy.  It is worse when she is heaving.  Patient reports that she has infrequent bowel movements at baseline but her last bowel movement was 2 days ago.  Patient has noticed no erythema at the surgical site.  She reports she has not been on any antibiotics recently.  On review of systems she reports some shortness of breath that is chronic.  She is not on any oxygen supplementation at home.  She has had some palpitations and chest pain yesterday.  She took a nitro and they were resolved.  She reports that the palpitations and chest pain were associated with nausea, diaphoresis, dizziness.  The dizziness has been 1 week intermittent.  She has no other complaints at this time.   Patient does smoke.  She request nicotine patch.  Patient does not drink alcohol and does not use illicit drugs.  She is vaccinated for COVID.  Patient is full code. Review of Systems: As mentioned in the history of present illness. All other systems reviewed and are negative.    Hospital Course:     1)Polymicrobial Bacterial infection of Rt Breast post op wound --MRSA, Proteus, Pseudomonas and Prevotella Bivia ---- after recent mastectomy-POA - S/p surgical debridement  Microbiology results: 3/3 Bcx: ngtd 3//3/24 and repeat wound cx from 07/26/22-Breast Wound Cx: with MRSA, Pseudomonas - pan S Proteus (resistant to cipro, amp, bactrim, I to zosyn) and Prevotella Bivia -Treated with IV vancomycin  and IV cefepime and p.o. metronidazole -Curbside consultation with ID physician Dr.Manandhar---she recommends  IV vancomycin, IV cefepime and p.o. metronidazole through 08/16/2022 -- she will continue to need wound Vac changes MWF.......at [facility]  -Outpatient follow-up with general surgeon Dr. Arnoldo Morale in about 4 weeks   2)Thyromegaly -TSH within normal limits. -Continue outpatient follow-up.   3)Tobacco use disorder/COPD - -No acute exacerbation, continue nicotine patch and bronchodilators   4)S/P left mastectomy -Continue to follow general surgery recommendations and postoperative management--- -- please see #1 above   5)Coronary artery disease - Continue aspirin, metoprolol,  Crestor and Imdur  -Remains chest pain-free   6)HTN- -continue metoprolol and Imdur, losartan was discontinued   7)Type 2 diabetes mellitus with complication, with long-term current use of insulin (HCC) -Continue Lantus 25 units nightly -Use Novolog/Humalog Sliding scale insulin with Accu-Cheks/Fingersticks as ordered    Morbid obesity: -Body mass index is 40.11 kg/m. -Low-calorie diet, portion control and increase physical activity discussed with patient.  Acute anemia--- suspect some of it is hemodilution from IV fluids -No evidence of significant ongoing blood loss at this time -Hgb stable above 10  Discharge Condition: stable  Follow UP   Contact information for follow-up providers     Aviva Signs, MD. Schedule an appointment as soon as possible for a visit on 08/31/2022.   Specialty: General Surgery Contact information: [address] [place] [id number] [phone]              Contact information for after-discharge care     Destination     [facility] .   Service: Skilled Nursing Contact information: [address] [place] [phone]                     Consults obtained -ID/general surgery  Diet  and Activity recommendation:  As advised  Discharge Instructions    Discharge Instructions     [facility] pharmacist to adjust dose for Vancomycin, Aminoglycosides and other anti-infective therapies as requested by physician.   Complete by: As directed    [facility] to provide Cath Flo '2mg'$    Complete by: As directed    Administer for PICC line occlusion and as ordered by physician for other access device issues.   Ambulatory Referral for Lung Cancer Scre   Complete by: As directed    Anaphylaxis Kit: Provided to treat any anaphylactic reaction to the medication being provided to the patient if First Dose or when requested by physician   Complete by: As directed    Epinephrine '1mg'$ /ml vial / amp: Administer 0.'3mg'$  (0.67m) subcutaneously once for moderate to severe anaphylaxis, nurse to call physician and pharmacy when reaction occurs and call 911 if needed for immediate care   Diphenhydramine '50mg'$ /ml IV vial: Administer 25-'50mg'$  IV/IM PRN for first dose reaction, rash, itching, mild reaction, nurse to call physician and pharmacy when reaction occurs   Sodium Chloride 0.9% NS 5065mIV: Administer if needed for hypovolemic blood pressure drop or as ordered by physician after call to physician with anaphylactic reaction   Call MD for:  difficulty breathing, headache or visual disturbances   Complete by: As directed    Call MD for:  persistant dizziness or light-headedness   Complete by: As directed    Call MD for:  persistant nausea and vomiting   Complete by: As directed    Call MD for:  temperature >100.4   Complete by: As directed    Change dressing on IV access line weekly and PRN   Complete by: As directed    Diet - low sodium heart healthy   Complete by: As directed    Diet Carb Modified   Complete by: As directed    Discharge instructions   Complete by: As directed    1)Please give IV vancomycin, IV cefepime and p.o. metronidazole through 08/16/2022 as ordered  for  Polymicrobial Bacterial Infection of  R-breast wound -Last Day of Therapy:  08/15/12 Labs - Once weekly:  CBC/D and BMP, Labs - Every other week:  ESR  and CR  2)follow up with general surgeon Dr. Arnoldo Morale in about 4 weeks  3) continue wound VAC change Mondays Wednesdays and Fridays at [facility] facility   Discharge wound care:   Complete by: As directed    Right breast wound VAC change every Monday Wednesday Friday as outlined above   Flush IV access with Sodium Chloride 0.9% and Heparin 10 units/ml or 100 units/ml   Complete by: As directed    Home infusion instructions - [facility]   Complete by: As directed    Instructions: Flush IV access with Sodium Chloride 0.9% and Heparin 10units/ml or 100units/ml   Change dressing on IV access line: Weekly and PRN   Instructions Cath Flo '2mg'$ : Administer for PICC Line occlusion and as ordered by physician for other access device   [facility] pharmacist to adjust dose for: Vancomycin, Aminoglycosides and other anti-infective therapies as requested by physician   Increase activity slowly   Complete by: As directed    Method of administration may be changed at the discretion of home infusion pharmacist based upon assessment of the patient and/or caregiver's ability to self-administer the medication ordered   Complete by: As directed        Discharge Medications     Allergies as of 07/30/2022       Reactions   Latex Rash        Medication List     STOP taking these medications    losartan 50 MG tablet Commonly known as: COZAAR   mometasone 0.1 % ointment Commonly known as: ELOCON   ondansetron 4 MG tablet Commonly known as: ZOFRAN   pregabalin 75 MG capsule Commonly known as: LYRICA       TAKE these medications    acetaminophen 325 MG tablet Commonly known as: TYLENOL Take 2 tablets (650 mg total) by mouth every 6 (six) hours as needed for mild pain (or Fever >/= 101).   anastrozole 1 MG  tablet Commonly known as: ARIMIDEX Take 1 tablet (1 mg total) by mouth daily.   aspirin EC 81 MG tablet Take 1 tablet (81 mg total) by mouth daily. Swallow whole.   baclofen 10 MG tablet Commonly known as: LIORESAL Take 10 mg by mouth 2 (two) times daily as needed.   ceFEPime  IVPB Commonly known as: MAXIPIME Inject 2 g into the vein every 8 (eight) hours for 17 days. Indication:  Polymicrobial Bacterial Infection of  R-breast wound First Dose: Yes Last Day of Therapy:  08/15/12 Labs - Once weekly:  CBC/D and BMP, Labs - Every other week:  ESR and CRP Method of administration: IV Push Method of administration may be changed at the discretion of home infusion pharmacist based upon assessment of the patient and/or caregiver's ability to self-administer the medication ordered.   cholecalciferol 25 MCG (1000 UNIT) tablet Commonly known as: VITAMIN D3 Take 1,000 Units by mouth daily.   clopidogrel 75 MG tablet Commonly known as: PLAVIX TAKE 1 TABLET BY MOUTH EVERY DAY   cyanocobalamin 1000 MCG tablet Commonly known as: VITAMIN B12 Take 1,000 mcg by mouth daily.   cyclobenzaprine 10 MG tablet Commonly known as: FLEXERIL Take 10 mg by mouth 2 (two) times daily as needed for muscle spasms.   DULoxetine 60 MG capsule Commonly known as: CYMBALTA Take 60 mg by mouth daily.   ezetimibe 10 MG tablet Commonly known as: ZETIA Take 10 mg by mouth daily.   furosemide 40 MG tablet Commonly known as: LASIX TAKE  1 AND 1/2 TABLETS(60 MG) BY MOUTH DAILY What changed: See the new instructions.   glimepiride 4 MG tablet Commonly known as: AMARYL Take 4 mg by mouth daily with breakfast.   hydrOXYzine 25 MG tablet Commonly known as: ATARAX Take 25 mg by mouth daily.   isosorbide mononitrate 30 MG 24 hr tablet Commonly known as: IMDUR Take 1 tablet (30 mg total) by mouth daily. Start taking on: July 31, 2022 What changed:  medication strength See the new instructions.   Lantus  SoloStar 100 UNIT/ML Solostar Pen Generic drug: insulin glargine Inject 25 Units into the skin at bedtime. What changed:  how much to take Another medication with the same name was removed. Continue taking this medication, and follow the directions you see here.   lubiprostone 24 MCG capsule Commonly known as: AMITIZA Take 24 mcg by mouth 2 (two) times daily.   metFORMIN 500 MG tablet Commonly known as: GLUCOPHAGE Take 500-1,000 mg by mouth See admin instructions. Take 2 tablets (1000 mg) in the morning and Take 1 tablets (500 mg) in the evening   metoprolol tartrate 25 MG tablet Commonly known as: LOPRESSOR Take 1 tablet (25 mg total) by mouth 2 (two) times daily.   metroNIDAZOLE 500 MG tablet Commonly known as: Flagyl Take 1 tablet (500 mg total) by mouth 2 (two) times daily for 17 days. Take thru 08/16/22   nicotine 21 mg/24hr patch Commonly known as: NICODERM CQ - dosed in mg/24 hours Place 1 patch (21 mg total) onto the skin daily. Start taking on: July 31, 2022   nitroGLYCERIN 0.4 MG SL tablet Commonly known as: NITROSTAT Place 1 tablet (0.4 mg total) under the tongue every 5 (five) minutes x 3 doses as needed for chest pain (if no relief after 3rd dose, proceed to ED or call 911).   ondansetron 4 MG disintegrating tablet Commonly known as: ZOFRAN-ODT Take 4 mg by mouth every 8 (eight) hours as needed for vomiting or nausea. For 7 days supply   oxyCODONE-acetaminophen 10-325 MG tablet Commonly known as: PERCOCET Take 1 tablet by mouth every 4 (four) hours as needed for pain.   rosuvastatin 10 MG tablet Commonly known as: CRESTOR Take 10 mg by mouth daily.   triamcinolone ointment 0.1 % Commonly known as: KENALOG Apply 1 Application topically daily as needed (For rash).   vancomycin  IVPB Inject 750 mg into the vein every 12 (twelve) hours for 17 days. Indication:  Polymicrobial R-breast wound First Dose: Yes Last Day of Therapy:  08/16/22 Labs - "Sunday/Monday:   CBC/D, BMP, and vancomycin trough. Labs - Thursday:  BMP and vancomycin trough Labs - Every other week:  ESR and CRP Method of administration:Elastomeric Method of administration may be changed at the discretion of the patient and/or caregiver's ability to self-administer the medication ordered.   vitamin C 1000 MG tablet Take 1,000 mg by mouth daily.               Discharge Care Instructions  (From admission, onward)           Start     Ordered   07/30/22 0000  Change dressing on IV access line weekly and PRN  (Home infusion instructions - [facility] )        03"$ /08/24 1424   07/30/22 0000  Discharge wound care:       Comments: Right breast wound VAC change every Monday Wednesday Friday as outlined above   07/30/22 1424  Major procedures and Radiology Reports - PLEASE review detailed and final reports for all details, in brief -    [place] EKG SITE RITE  Result Date: 07/30/2022 If Site Rite image not attached, placement could not be confirmed due to current cardiac rhythm.  CT Chest W Contrast  Result Date: 07/25/2022 CLINICAL DATA:  Status post left mastectomy on 2/5, now with infection, cellulitis versus abscess EXAM: CT CHEST WITH CONTRAST TECHNIQUE: Multidetector CT imaging of the chest was performed during intravenous contrast administration. RADIATION DOSE REDUCTION: This exam was performed according to the departmental dose-optimization program which includes automated exposure control, adjustment of the mA and/or kV according to patient size and/or use of iterative reconstruction technique. CONTRAST:  78m OMNIPAQUE IOHEXOL 300 MG/ML  SOLN COMPARISON:  None Available. FINDINGS: Cardiovascular: The heart is normal in size. No pericardial effusion. No evidence of thoracic aortic aneurysm. Atherosclerotic calcifications of the aortic arch. Severe three-vessel coronary atherosclerosis. Postsurgical changes related to prior CABG. Mediastinum/Nodes: Small  left axillary nodes measuring up to 8 mm short axis (series 2/images 30 and 52), warranting attention on follow-up. No suspicious mediastinal lymphadenopathy. Visualized thyroid is unremarkable. Lungs/Pleura: Mild centrilobular and paraseptal emphysematous changes, upper lung predominant. No suspicious pulmonary nodules. No focal consolidation. No pleural effusion or pneumothorax. Upper Abdomen: Visualized upper abdomen is grossly unremarkable, noting vascular calcifications. Musculoskeletal: Status post left mastectomy. Associated 5.5 x 14.4 x 7.9 cm fluid and gas collection in the surgical bed. While postoperative seroma is possible, given the clinical setting (approximally 1 month since initial surgery), this appearance is worrisome for postoperative infection/abscess. Overlying subcutaneous stranding with cutaneous thickening (series 2/image 87), suggesting associated cellulitis. Degenerative changes of the mid/lower thoracic spine. Median sternotomy. IMPRESSION: Status post left mastectomy. 5.5 x 14.4 x 7.9 cm fluid and gas collection in the surgical bed, worrisome for postoperative infection/abscess. Associated cellulitis. Small left axillary nodes measuring up to 8 mm short axis, possibly reactive, but warranting attention on follow-up. Aortic Atherosclerosis (ICD10-I70.0) and Emphysema (ICD10-J43.9). Electronically Signed   By: SJulian HyM.D.   On: 07/25/2022 18:09   DG Chest Port 1 View  Result Date: 07/25/2022 CLINICAL DATA:  Sepsis. EXAM: PORTABLE CHEST 1 VIEW COMPARISON:  Chest x-ray 12/06/2021 and older FINDINGS: Status post median sternotomy. Mediastinal clips. Overlapping cardiac leads. No consolidation, pneumothorax or effusion. No edema. Normal cardiopericardial silhouette. Eventration of the right hemidiaphragm. IMPRESSION: No acute cardiopulmonary disease Electronically Signed   By: AJill SideM.D.   On: 07/25/2022 17:23    Micro Results   Recent Results (from the past 240  hour(s))  Blood Culture (routine x 2)     Status: None (Preliminary result)   Collection Time: 07/25/22  5:09 PM   Specimen: Site Not Specified; Blood  Result Value Ref Range Status   Specimen Description   Final    SITE NOT SPECIFIED BOTTLES DRAWN AEROBIC AND ANAEROBIC   Special Requests Blood Culture adequate volume  Final   Culture   Final    NO GROWTH 4 DAYS [facility] [address] [place] [address]   Report Status PENDING  Incomplete  Blood Culture (routine x 2)     Status: None (Preliminary result)   Collection Time: 07/25/22  5:14 PM   Specimen: BLOOD RIGHT ARM  Result Value Ref Range Status   Specimen Description   Final    BLOOD RIGHT ARM BOTTLES DRAWN AEROBIC AND ANAEROBIC   Special Requests Blood Culture adequate volume  Final  Culture   Final    NO GROWTH 4 DAYS Performed at [facility], [address] [place], [place] [address]    Report Status PENDING  Incomplete  Aerobic Culture w Gram Stain (superficial specimen)     Status: None   Collection Time: 07/25/22  5:30 PM   Specimen: Breast  Result Value Ref Range Status   Specimen Description   Final    BREAST Performed at [facility], [address], [place], [place] [address]    Special Requests   Final    NONE Performed at [facility], [address] [place], [place] [address]    Gram Stain   Final    FEW WBC PRESENT,BOTH PMN AND MONONUCLEAR MODERATE GRAM POSITIVE COCCI IN CLUSTERS RARE GRAM VARIABLE ROD Performed at [facility], [place] [address] [place], [place] [address]    Culture   Final    MODERATE METHICILLIN RESISTANT STAPHYLOCOCCUS AUREUS MODERATE PSEUDOMONAS AERUGINOSA FEW PROTEUS MIRABILIS    Report Status 07/28/2022 FINAL  Final   Organism ID, Bacteria METHICILLIN RESISTANT STAPHYLOCOCCUS AUREUS  Final   Organism ID, Bacteria PSEUDOMONAS AERUGINOSA  Final   Organism ID, Bacteria PROTEUS MIRABILIS  Final      Susceptibility   Methicillin resistant  staphylococcus aureus - MIC*    CIPROFLOXACIN <=0.5 SENSITIVE Sensitive     ERYTHROMYCIN >=8 RESISTANT Resistant     GENTAMICIN <=0.5 SENSITIVE Sensitive     OXACILLIN >=4 RESISTANT Resistant     TETRACYCLINE <=1 SENSITIVE Sensitive     VANCOMYCIN 1 SENSITIVE Sensitive     TRIMETH/SULFA <=10 SENSITIVE Sensitive     CLINDAMYCIN <=0.25 SENSITIVE Sensitive     RIFAMPIN <=0.5 SENSITIVE Sensitive     Inducible Clindamycin NEGATIVE Sensitive     * MODERATE METHICILLIN RESISTANT STAPHYLOCOCCUS AUREUS   Pseudomonas aeruginosa - MIC*    CEFTAZIDIME 4 SENSITIVE Sensitive     CIPROFLOXACIN <=0.25 SENSITIVE Sensitive     GENTAMICIN <=1 SENSITIVE Sensitive     IMIPENEM 2 SENSITIVE Sensitive     PIP/TAZO 8 SENSITIVE Sensitive     CEFEPIME 2 SENSITIVE Sensitive     * MODERATE PSEUDOMONAS AERUGINOSA   Proteus mirabilis - MIC*    AMPICILLIN >=32 RESISTANT Resistant     CEFEPIME 1 SENSITIVE Sensitive     CEFTAZIDIME <=1 SENSITIVE Sensitive     CEFTRIAXONE <=0.25 SENSITIVE Sensitive     CIPROFLOXACIN >=4 RESISTANT Resistant     GENTAMICIN <=1 SENSITIVE Sensitive     IMIPENEM 2 SENSITIVE Sensitive     TRIMETH/SULFA >=320 RESISTANT Resistant     AMPICILLIN/SULBACTAM >=32 RESISTANT Resistant     PIP/TAZO 64 INTERMEDIATE Intermediate     * FEW PROTEUS MIRABILIS  Surgical PCR screen     Status: Abnormal   Collection Time: 07/26/22  8:40 AM   Specimen: Nasal Mucosa; Nasal Swab  Result Value Ref Range Status   MRSA, PCR POSITIVE (A) NEGATIVE Final   Staphylococcus aureus POSITIVE (A) NEGATIVE Final    Comment: RESULT CALLED TO, READ BACK BY AND VERIFIED WITH: PATE, RN @ [id number] ON 07/26/22 C VARNER (NOTE) The Xpert SA Assay (FDA approved for NASAL specimens in patients 15 years of age and older), is one component of a comprehensive surveillance program. It is not intended to diagnose infection nor to guide or monitor treatment. Performed at [facility], [address], [place], [place] [address]    Aerobic/Anaerobic Culture w Gram Stain (surgical/deep wound)     Status: None  Collection Time: 07/26/22 12:58 PM   Specimen: PATH Other; Tissue  Result Value Ref Range Status   Specimen Description   Final    WOUND BREAST Performed at [facility], [address] [place], [place] [address]    Special Requests   Final    NONE Performed at [facility], [address] [place], [place] [address]    Gram Stain   Final    NO WBC SEEN FEW GRAM POSITIVE COCCI IN PAIRS FEW GRAM POSITIVE COCCI IN CHAINS FEW GRAM POSITIVE RODS RARE GRAM NEGATIVE RODS    Culture   Final    MODERATE METHICILLIN RESISTANT STAPHYLOCOCCUS AUREUS MODERATE PROTEUS MIRABILIS MODERATE PSEUDOMONAS AERUGINOSA FEW PREVOTELLA BIVIA BETA LACTAMASE POSITIVE Performed at [facility], [place] [address], [place], [place] [address]    Report Status 07/29/2022 FINAL  Final   Organism ID, Bacteria METHICILLIN RESISTANT STAPHYLOCOCCUS AUREUS  Final   Organism ID, Bacteria PROTEUS MIRABILIS  Final   Organism ID, Bacteria PSEUDOMONAS AERUGINOSA  Final      Susceptibility   Methicillin resistant staphylococcus aureus - MIC*    CIPROFLOXACIN <=0.5 SENSITIVE Sensitive     ERYTHROMYCIN >=8 RESISTANT Resistant     GENTAMICIN <=0.5 SENSITIVE Sensitive     OXACILLIN >=4 RESISTANT Resistant     TETRACYCLINE <=1 SENSITIVE Sensitive     VANCOMYCIN 1 SENSITIVE Sensitive     TRIMETH/SULFA <=10 SENSITIVE Sensitive     CLINDAMYCIN <=0.25 SENSITIVE Sensitive     RIFAMPIN <=0.5 SENSITIVE Sensitive     Inducible Clindamycin NEGATIVE Sensitive     * MODERATE METHICILLIN RESISTANT STAPHYLOCOCCUS AUREUS   Pseudomonas aeruginosa - MIC*    CEFTAZIDIME 4 SENSITIVE Sensitive     CIPROFLOXACIN <=0.25 SENSITIVE Sensitive     GENTAMICIN <=1 SENSITIVE Sensitive     IMIPENEM 1 SENSITIVE Sensitive     PIP/TAZO 8 SENSITIVE Sensitive     CEFEPIME 2 SENSITIVE Sensitive     * MODERATE PSEUDOMONAS AERUGINOSA   Proteus mirabilis - MIC*     AMPICILLIN >=32 RESISTANT Resistant     CEFEPIME 0.25 SENSITIVE Sensitive     CEFTAZIDIME <=1 SENSITIVE Sensitive     CEFTRIAXONE <=0.25 SENSITIVE Sensitive     CIPROFLOXACIN >=4 RESISTANT Resistant     GENTAMICIN <=1 SENSITIVE Sensitive     IMIPENEM 2 SENSITIVE Sensitive     TRIMETH/SULFA >=320 RESISTANT Resistant     AMPICILLIN/SULBACTAM >=32 RESISTANT Resistant     PIP/TAZO <=4 SENSITIVE Sensitive     * MODERATE PROTEUS MIRABILIS    Today   Subjective    Katelyn Kaufman today has no new complaints No fever  Or chills   No Nausea, Vomiting or Diarrhea  Patient has been seen and examined prior to discharge   Objective   Blood pressure 131/62, pulse (!) 49, temperature 98 F (36.7 C), temperature source Oral, resp. rate 18, height '5\' 4"'$  (1.626 m), weight 106 kg, SpO2 98 %.   Intake/Output Summary (Last 24 hours) at 07/30/2022 1425 Last data filed at 07/29/2022 1900 Gross per 24 hour  Intake 240 ml  Output --  Net 240 ml    Exam Physical Exam Gen:- Awake Alert, in no acute distress, morbidly obese HEENT:- [place].AT, No sclera icterus Neck-Supple Neck,No JVD,.  Lungs-  CTAB , fair air movement bilaterally  Breast--left breast wall wound with wound VAC in situ CV- S1, S2 normal, RRR Abd-  +ve B.Sounds, Abd Soft, No tenderness,    Extremity/Skin:- No  edema,  good pedal pulses  Psych-affect is appropriate, oriented x3 Neuro-generalized weakness, no new focal deficits, no tremors   Data Review   CBC w Diff:  Lab Results  Component Value Date   WBC 6.5 07/30/2022   HGB 10.1 (L) 07/30/2022   HCT 30.7 (L) 07/30/2022   PLT 249 07/30/2022   LYMPHOPCT 35 07/26/2022   MONOPCT 30 07/26/2022   EOSPCT 0 07/26/2022   BASOPCT 1 07/26/2022    CMP:  Lab Results  Component Value Date   NA 136 07/30/2022   K 4.0 07/30/2022   CL 104 07/30/2022   CO2 23 07/30/2022   BUN 10 07/30/2022   CREATININE 0.99 07/30/2022   PROT 6.4 (L) 07/26/2022   ALBUMIN 2.5 (L) 07/26/2022    BILITOT 0.6 07/26/2022   ALKPHOS 48 07/26/2022   AST 14 (L) 07/26/2022   ALT 12 07/26/2022  .  Total Discharge time is about 33 minutes  Roxan Hockey M.D on 07/30/2022 at 2:25 PM  Go to [email or web address] -  for contact info  [facility] - Office  [phone]

## 2022-07-30 NOTE — Progress Notes (Signed)
Pt report called to [facility], nurse Loma Sousa, RN.

## 2022-07-30 NOTE — Progress Notes (Signed)
Patient to be discharged to [facility] rehabilitation facility today.  I have told patient's nurse to remove wound VAC and pack with wet-to-dry dressing.  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  This is being addressed by Dr. Denton Brick.  I will see the patient again in 4 weeks for follow-up.

## 2022-07-30 NOTE — TOC Transition Note (Addendum)
Transition of Care [facility]) - CM/SW Discharge Note   Patient Details  Name: Katelyn Kaufman MRN: [id number] Date of Birth: September 06, 1950  [facility]) CM/SW Contact:  Ihor Gully, LCSW Phone Number: 07/30/2022, 4:13 PM   Clinical Narrative:    D/c clinicals sent to facility. RN to call report. Patient to transport after completion of abx, Biomedical engineer to call EMS.   Final next level of care: Skilled Nursing Facility Barriers to Discharge: No Barriers Identified   Patient Goals and CMS Choice CMS Medicare.gov Compare Post Acute Care list provided to:: Patient Choice offered to / list presented to : Patient  Discharge Placement                Patient chooses bed at: [facility] Patient to be transferred to facility by: [place] Name of family member notified: message left for sister, Kennyth Lose Patient and family notified of of transfer: 07/30/22  Discharge Plan and Services Additional resources added to the After Visit Summary for   In-house Referral: Clinical Social Work   Post Acute Care Choice: [place]                               Social Determinants of Health ([place]) Interventions Reader: Food Insecurity Present (07/25/2022)  Housing: Medium Risk (07/25/2022)  Transportation Needs: No Transportation Needs (07/25/2022)  Utilities: Not At Risk (07/25/2022)  Tobacco Use: High Risk (07/26/2022)     Readmission Risk Interventions    11/23/2021    9:39 AM  Readmission Risk Prevention Plan  Post Dischage Appt Complete  Medication Screening Complete  Transportation Screening Complete

## 2022-07-30 NOTE — Progress Notes (Signed)
Peripherally Inserted Central Catheter Placement  The IV Nurse has discussed with the patient and/or persons authorized to consent for the patient, the purpose of this procedure and the potential benefits and risks involved with this procedure.  The benefits include less needle sticks, lab draws from the catheter, and the patient may be discharged home with the catheter. Risks include, but not limited to, infection, bleeding, blood clot (thrombus formation), and puncture of an artery; nerve damage and irregular heartbeat and possibility to perform a PICC exchange if needed/ordered by physician.  Alternatives to this procedure were also discussed.  Bard Power PICC patient education guide, fact sheet on infection prevention and patient information card has been provided to patient /or left at bedside.    PICC Placement Documentation  PICC Single Lumen 07/30/22 Right Brachial 38 cm 0 cm (Active)  Indication for Insertion or Continuance of Line Home intravenous therapies (PICC only) 07/30/22 1511  Exposed Catheter (cm) 0 cm 07/30/22 1511  Site Assessment Clean, Dry, Intact 07/30/22 1511  Line Status Flushed;Saline locked;Blood return noted 07/30/22 1511  Dressing Type Transparent;Securing device 07/30/22 1511  Dressing Status Antimicrobial disc in place 07/30/22 1511  Safety Lock Not Applicable [id number]  Line Adjustment (NICU/IV Team Only) No 07/30/22 1511  Dressing Change Due 08/06/22 07/30/22 1511       Frances Maywood 07/30/2022, 3:13 PM

## 2022-07-31 LAB — GLUCOSE, CAPILLARY
Glucose-Capillary: 84 mg/dL (ref 70–99)
Glucose-Capillary: 179 mg/dL — ABNORMAL HIGH (ref 70–99)

## 2022-07-31 NOTE — Progress Notes (Signed)
EMS arrived too late to transport pt to [facility], pt updated, MD notified, CN made aware. Pt called her family to make them aware of plan. Pt will transport in am.

## 2022-07-31 NOTE — TOC Transition Note (Signed)
Transition of Care [facility]) - CM/SW Discharge Note   Patient Details  Name: WYKESHA KRANER MRN: [id number] Date of Birth: Feb 20, 1951  Transition of Care (TOC) CM/SW Contact:  Joaquin Courts, RN Phone Number: 07/31/2022, 10:46 AM   Clinical Narrative:     CM contacted [facility] and confirmed patient can admit today.  EMS transport scheduled. No further TOC needs identified.   Final next level of care: Skilled Nursing Facility Barriers to Discharge: No Barriers Identified   Patient Goals and CMS Choice CMS Medicare.gov Compare Post Acute Care list provided to:: Patient Choice offered to / list presented to : Patient  Discharge Placement                Patient chooses bed at: [facility] Patient to be transferred to facility by: [place] Name of family member notified: message left for sister, Kennyth Lose Patient and family notified of of transfer: 07/30/22  Discharge Plan and Services Additional resources added to the After Visit Summary for   In-house Referral: Clinical Social Work   Post Acute Care Choice: [place]                               Social Determinants of Health ([place]) Interventions [place]: Food Insecurity Present (07/25/2022)  Housing: Medium Risk (07/25/2022)  Transportation Needs: No Transportation Needs (07/25/2022)  Utilities: Not At Risk (07/25/2022)  Tobacco Use: High Risk (07/26/2022)     Readmission Risk Interventions    11/23/2021    9:39 AM  Readmission Risk Prevention Plan  Post Dischage Appt Complete  Medication Screening Complete  Transportation Screening Complete

## 2022-07-31 NOTE — Progress Notes (Signed)
Patient Report given to Narda Rutherford, RN.  Pt discharged via [place] to [facility].

## 2022-07-31 NOTE — Progress Notes (Signed)
Addendum -Pt was dced to SNF Facility on 07/30/22--- apparently due to transportation issues patient did not leave -Patient seen and reevaluated on 07/31/2022, patient remains medically stable for discharge to SNF today 07/31/2022 -Please see full discharge summary dated 07/30/2022  Roxan Hockey, MD

## 2022-07-31 NOTE — Progress Notes (Signed)
[facility]  Not transported yet.  If she does not go today, will need her wet to dry dressing changed.  Per report she is getting a new wound vac at the facility.  Curlene Labrum, MD [facility] [address] [place], [place] [address] [phone] (office)

## 2022-08-03 ENCOUNTER — Inpatient Hospital Stay: Payer: Medicare HMO | Attending: Hematology | Admitting: Hematology

## 2022-08-03 VITALS — BP 127/64 | HR 79 | Temp 98.2°F | Resp 18 | Ht 64.0 in | Wt 250.0 lb

## 2022-08-03 DIAGNOSIS — E119 Type 2 diabetes mellitus without complications: Secondary | ICD-10-CM | POA: Diagnosis not present

## 2022-08-03 DIAGNOSIS — M8580 Other specified disorders of bone density and structure, unspecified site: Secondary | ICD-10-CM | POA: Diagnosis not present

## 2022-08-03 DIAGNOSIS — Z79811 Long term (current) use of aromatase inhibitors: Secondary | ICD-10-CM | POA: Diagnosis not present

## 2022-08-03 DIAGNOSIS — Z170 Estrogen receptor positive status [ER+]: Secondary | ICD-10-CM | POA: Insufficient documentation

## 2022-08-03 DIAGNOSIS — F17210 Nicotine dependence, cigarettes, uncomplicated: Secondary | ICD-10-CM | POA: Diagnosis not present

## 2022-08-03 DIAGNOSIS — I10 Essential (primary) hypertension: Secondary | ICD-10-CM | POA: Diagnosis not present

## 2022-08-03 DIAGNOSIS — Z951 Presence of aortocoronary bypass graft: Secondary | ICD-10-CM | POA: Diagnosis not present

## 2022-08-03 DIAGNOSIS — Z9012 Acquired absence of left breast and nipple: Secondary | ICD-10-CM | POA: Insufficient documentation

## 2022-08-03 DIAGNOSIS — C50412 Malignant neoplasm of upper-outer quadrant of left female breast: Secondary | ICD-10-CM | POA: Diagnosis not present

## 2022-08-03 MED ORDER — ANASTROZOLE 1 MG PO TABS: 1.0000 mg | ORAL_TABLET | Freq: Every day | ORAL | 1 refills | Status: DC

## 2022-08-03 NOTE — Progress Notes (Signed)
[facility] [address] [place] [address]    Clinic Day:  08/03/2022  Referring physician: Luciano Cutter, DO  Patient Care Team: Luciano Cutter, DO as PCP - General (Family Medicine) Satira Sark, MD as PCP - Cardiology (Cardiology) Derek Jack, MD as Medical Oncologist (Medical Oncology) Brien Mates, RN as Oncology Nurse Navigator (Medical Oncology)   ASSESSMENT & PLAN:   Assessment: Stage II [facility]) left breast cancer: - Abnormal screening mammogram on 10/14/2021, followed by diagnostic mammogram on 10/27/2021 - Left breast ultrasound (11/16/2021): Irregular mass containing calcifications in the left breast at 2 o'clock position 2 cm from nipple measuring 1.7 x 0.6 x 1 cm.  In addition there are several similar-appearing masses extending from this mass towards the nipple with a mass in the left breast 2:00 retroareolar measuring 0.4 x 0.4 x 0.4 cm.  Together these masses span a distance of approximately 2.7 cm.  No lymphadenopathy in the axilla. 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 - She will underwent CABG x3 on 11/16/2021 - 06/25/2022: Left simple mastectomy - Pathology: 5.5 cm infiltrating ductal carcinoma, grade 2, margins negative, PT3 PNX - 07/26/2022: Incision and debridement of left mastectomy wound - Anastrozole started on 08/03/2022    Social/family history: - She lives at home with roommate.  She worked in Electronics engineer.  Current smoker, half pack per day for 40 years. - Brother had cancer.  Plan: Stage II (T3N0G2) left breast cancer, ER/PR +, HER2-: -I have reviewed her pathology reports. - I have discussed further treatment options. - She still has wound VAC in place at the left mastectomy site. - She is currently at [facility] rehab and  health care center. - I talked to her about initiating anastrozole 1 mg daily.  We talked about side effects including vasomotor symptoms, musculoskeletal symptoms, decreased bone mineral density among others. - We will send prescription to her nursing home pharmacy. - RTC 2 months for follow-up with repeat labs.  2.  Osteopenia (DEXA 12/18/2021 T-score -1.2): - We have reviewed bone density test results which showed osteopenia. - I have also reviewed vitamin D levels which were slightly low at 26.7. - She reports that she is taking calcium and vitamin D. - I plan to check vitamin D level at next visit.  Orders Placed This Encounter  Procedures   CBC with Differential/Platelet    Standing Status:   Future    Standing Expiration Date:   08/03/2023    Order Specific Question:   Release to patient    Answer:   Immediate   Comprehensive metabolic panel    Standing Status:   Future    Standing Expiration Date:   08/03/2023    Order Specific Question:   Release to patient    Answer:   Immediate   VITAMIN D 25 Hydroxy (Vit-D Deficiency, Fractures)    Standing Status:   Future    Standing Expiration Date:   08/03/2023    Order Specific Question:   Release to patient    Answer:   Immediate      I,Katelyn Kaufman,acting as a scribe for Derek Jack, MD.,have documented all relevant documentation on the behalf of Derek Jack, MD,as directed by  Derek Jack, MD while in the presence of [facility]  Delton Coombes, MD.   I, Derek Jack MD, have reviewed the above documentation for accuracy and completeness, and I agree with the above.   Derek Jack, MD   3/12/20244:53 PM  CHIEF COMPLAINT:   Diagnosis: left breast cancer   Cancer Staging  Breast cancer of upper-outer quadrant of left female breast [facility]) Staging form: Breast, AJCC 8th Edition - Clinical stage from 12/10/2021: Stage IIA (cT3, cN0, cM0, G2, ER+, PR+, HER2-) - Signed by Derek Jack, MD  on 08/03/2022    Prior Therapy: left mastectomy 06/25/22  Current Therapy:  Anastrozole   HISTORY OF PRESENT ILLNESS:   Oncology History  Breast cancer of upper-outer quadrant of left female breast ([place])  12/10/2021 Initial Diagnosis   Breast cancer of upper-outer quadrant of left female breast ([place])   12/10/2021 Cancer Staging   Staging form: Breast, AJCC 8th Edition - Clinical stage from 12/10/2021: Stage IIA (cT3, cN0, cM0, G2, ER+, PR+, HER2-) - Signed by Derek Jack, MD on 08/03/2022 Histopathologic type: Infiltrating duct carcinoma, NOS Stage prefix: Initial diagnosis Histologic grading system: 3 grade system      INTERVAL HISTORY:   Katelyn Kaufman is a 72 y.o. female presenting to clinic today for follow up of left breast cancer. She was last seen by me on 12/10/21 for consult.  Patient had a left mastectomy on 06/25/22.  Today, she states that she is doing well overall. Her appetite level is at 50%. Her energy level is at 25%. She reports that she is at [facility] rehab center.-She still has wound VAC in place.  She has polyarthralgias from osteoarthritis.   PAST MEDICAL HISTORY:   Past Medical History: 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   COPD (chronic obstructive pulmonary disease) ([place])    Depression    Essential hypertension    Hyperlipidemia    Myocardial infarction ([place])    [place] to the LAD   OSA (obstructive sleep apnea)    CPAP qHS   PONV (postoperative nausea and vomiting)    Type 2 diabetes mellitus ([place])     Surgical History: Past Surgical History:  Procedure Laterality Date   BACK SURGERY     BREAST SURGERY     CATARACT EXTRACTION W/PHACO Right 10/24/2020   Procedure: CATARACT EXTRACTION PHACO AND  INTRAOCULAR LENS PLACEMENT (IOC);  Surgeon: Baruch Goldmann, MD;  Location: [facility];  Service: Ophthalmology;  Laterality: Right;  CDE: 10.87   CATARACT EXTRACTION W/PHACO Left 12/01/2020   Procedure: CATARACT EXTRACTION PHACO AND INTRAOCULAR LENS PLACEMENT LEFT EYE;  Surgeon: Baruch Goldmann, MD;  Location: [facility];  Service: Ophthalmology;  Laterality: Left;  CDE  8.62   CORONARY ARTERY BYPASS GRAFT N/A 11/16/2021   Procedure: CORONARY ARTERY BYPASS GRAFTING (CABG) times three using the left internal mammary and right saphenous vein.;  Surgeon: Gaye Pollack, MD;  Location: [facility];  Service: Open Heart Surgery;  Laterality: N/A;   IABP INSERTION N/A 11/16/2021   Procedure: IABP Insertion;  Surgeon: Leonie Man, MD;  Location: [place] [facility];  Service: Cardiovascular;  Laterality: N/A;   LEFT HEART CATH AND CORONARY ANGIOGRAPHY N/A 11/16/2021   Procedure: LEFT HEART CATH AND CORONARY ANGIOGRAPHY;  Surgeon: Leonie Man, MD;  Location: [place] CV LAB;  Service: Cardiovascular;  Laterality: N/A;   REPLACEMENT TOTAL KNEE Right    SIMPLE MASTECTOMY WITH AXILLARY SENTINEL NODE BIOPSY Left 06/25/2022   Procedure: SIMPLE MASTECTOMY;  Surgeon: Aviva Signs, MD;  Location: [facility];  Service: General;  Laterality: Left;   TEE WITHOUT CARDIOVERSION N/A 11/16/2021   Procedure: TRANSESOPHAGEAL ECHOCARDIOGRAM (TEE);  Surgeon: Gaye Pollack, MD;  Location: [facility];  Service: Open Heart Surgery;  Laterality: N/A;   TUBAL LIGATION      Social History: Social History   Socioeconomic History   Marital status: Widowed    Spouse name: Not on file   Number of children: Not on file   Years of education: Not on file   Highest education level: Not on file  Occupational History   Not on file  Tobacco Use   Smoking status: Every Day    Packs/day: 0.50    Years: 40.00    Total pack years: 20.00    Types: Cigarettes   Smokeless tobacco: Never  Vaping Use   Vaping Use: Never used  Substance and Sexual  Activity   Alcohol use: Never   Drug use: Never   Sexual activity: Not Currently  Other Topics Concern   Not on file  Social History Narrative   Not on file   Social Determinants of Health   Financial Resource Strain: Not on file  Food Insecurity: Food Insecurity Present (07/25/2022)   Hunger Vital Sign    Worried About Running Out of Food in the Last Year: Often true    Ran Out of Food in the Last Year: Often true  Transportation Needs: No Transportation Needs (07/25/2022)   PRAPARE - Hydrologist (Medical): No    Lack of Transportation (Non-Medical): No  Physical Activity: Not on file  Stress: Not on file  Social Connections: Not on file  Intimate Partner Violence: Not At Risk (07/25/2022)   Humiliation, Afraid, Rape, and Kick questionnaire    Fear of Current or Ex-Partner: No    Emotionally Abused: No    Physically Abused: No    Sexually Abused: No    Family History: Family History  Problem Relation Age of Onset   COPD Mother    Alcohol abuse Father    Hypertension Sister    Heart attack Brother     Current Medications:  Current Outpatient Medications:    acetaminophen (TYLENOL) 325 MG tablet, Take 2 tablets (650 mg total) by mouth every 6 (six) hours as needed for mild pain (or Fever >/= 101)., Disp: 100 tablet, Rfl: 2   Ascorbic Acid (VITAMIN C) 1000 MG tablet, Take 1,000 mg by mouth daily., Disp: , Rfl:    aspirin EC 81 MG tablet, Take 1 tablet (81 mg total) by mouth daily. Swallow whole., Disp: 30 tablet, Rfl: 12   baclofen (LIORESAL) 10 MG tablet, Take 10 mg by mouth 2 (two) times daily as needed., Disp: , Rfl:    ceFEPime (MAXIPIME) IVPB, Inject 2 g into the vein every 8 (eight) hours for 17 days. Indication:  Polymicrobial Bacterial Infection of  R-breast wound First Dose: Yes Last Day of Therapy:  08/15/12 Labs - Once weekly:  CBC/D and BMP, Labs - Every other week:  ESR and CRP Method of administration: IV Push Method of administration  may be changed at the discretion of home infusion pharmacist based upon assessment of the patient and/or caregiver's ability to self-administer the medication ordered., Disp: 51 Units, Rfl: 0   cholecalciferol (VITAMIN  D3) 25 MCG (1000 UNIT) tablet, Take 1,000 Units by mouth daily., Disp: , Rfl:    clopidogrel (PLAVIX) 75 MG tablet, TAKE 1 TABLET BY MOUTH EVERY DAY, Disp: 90 tablet, Rfl: 1   cyclobenzaprine (FLEXERIL) 10 MG tablet, Take 10 mg by mouth 2 (two) times daily as needed for muscle spasms., Disp: , Rfl:    DULoxetine (CYMBALTA) 60 MG capsule, Take 60 mg by mouth daily., Disp: , Rfl:    ezetimibe (ZETIA) 10 MG tablet, Take 10 mg by mouth daily., Disp: , Rfl:    furosemide (LASIX) 40 MG tablet, TAKE 1 AND 1/2 TABLETS(60 MG) BY MOUTH DAILY (Patient taking differently: Take 40 mg by mouth daily. Take 1 and 1/2 tablet by mouth daily), Disp: 135 tablet, Rfl: 2   glimepiride (AMARYL) 4 MG tablet, Take 4 mg by mouth daily with breakfast., Disp: , Rfl:    hydrOXYzine (ATARAX) 25 MG tablet, Take 25 mg by mouth daily., Disp: , Rfl:    isosorbide mononitrate (IMDUR) 30 MG 24 hr tablet, Take 1 tablet (30 mg total) by mouth daily., Disp: 30 tablet, Rfl: 2   LANTUS SOLOSTAR 100 UNIT/ML Solostar Pen, Inject 25 Units into the skin at bedtime., Disp: 15 mL, Rfl: 11   lubiprostone (AMITIZA) 24 MCG capsule, Take 24 mcg by mouth 2 (two) times daily., Disp: , Rfl:    metFORMIN (GLUCOPHAGE) 500 MG tablet, Take 500-1,000 mg by mouth See admin instructions. Take 2 tablets (1000 mg) in the morning and Take 1 tablets (500 mg) in the evening, Disp: , Rfl:    metoprolol tartrate (LOPRESSOR) 25 MG tablet, Take 1 tablet (25 mg total) by mouth 2 (two) times daily., Disp: 60 tablet, Rfl: 1   metroNIDAZOLE (FLAGYL) 500 MG tablet, Take 1 tablet (500 mg total) by mouth 2 (two) times daily for 17 days. Take thru 08/16/22, Disp: 34 tablet, Rfl: 0   nicotine (NICODERM CQ - DOSED IN MG/24 HOURS) 21 mg/24hr patch, Place 1 patch (21  mg total) onto the skin daily., Disp: 30 patch, Rfl: 0   nitroGLYCERIN (NITROSTAT) 0.4 MG SL tablet, Place 1 tablet (0.4 mg total) under the tongue every 5 (five) minutes x 3 doses as needed for chest pain (if no relief after 3rd dose, proceed to ED or call 911)., Disp: 75 tablet, Rfl: 1   ondansetron (ZOFRAN-ODT) 4 MG disintegrating tablet, Take 4 mg by mouth every 8 (eight) hours as needed for vomiting or nausea. For 7 days supply, Disp: , Rfl:    oxyCODONE-acetaminophen (PERCOCET) 10-325 MG tablet, Take 1 tablet by mouth every 4 (four) hours as needed for pain., Disp: 15 tablet, Rfl: 0   rosuvastatin (CRESTOR) 10 MG tablet, Take 10 mg by mouth daily., Disp: , Rfl:    triamcinolone ointment (KENALOG) 0.1 %, Apply 1 Application topically daily as needed (For rash)., Disp: , Rfl:    vancomycin IVPB, Inject 750 mg into the vein every 12 (twelve) hours for 17 days. Indication:  Polymicrobial R-breast wound First Dose: Yes Last Day of Therapy:  08/16/22 Labs - Sunday/Monday:  CBC/D, BMP, and vancomycin trough. Labs - Thursday:  BMP and vancomycin trough Labs - Every other week:  ESR and CRP Method of administration:Elastomeric Method of administration may be changed at the discretion of the patient and/or caregiver's ability to self-administer the medication ordered., Disp: 34 Units, Rfl: 0   vitamin B-12 (CYANOCOBALAMIN) 1000 MCG tablet, Take 1,000 mcg by mouth daily., Disp: , Rfl:    anastrozole (ARIMIDEX) 1  MG tablet, Take 1 tablet (1 mg total) by mouth daily., Disp: 90 tablet, Rfl: 1   Allergies: Allergies  Allergen Reactions   Latex Rash    REVIEW OF SYSTEMS:   Review of Systems  Constitutional:  Negative for chills, fatigue and fever.  HENT:   Negative for lump/mass, mouth sores, nosebleeds, sore throat and trouble swallowing.   Eyes:  Negative for eye problems.  Respiratory:  Positive for shortness of breath. Negative for cough.   Cardiovascular:  Negative for chest pain, leg swelling and  palpitations.  Gastrointestinal:  Positive for nausea. Negative for abdominal pain, constipation, diarrhea and vomiting.  Genitourinary:  Negative for bladder incontinence, difficulty urinating, dysuria, frequency, hematuria and nocturia.   Musculoskeletal:  Negative for arthralgias, back pain, flank pain, myalgias and neck pain.  Skin:  Negative for itching and rash.  Neurological:  Positive for dizziness and headaches. Negative for numbness.  Hematological:  Does not bruise/bleed easily.  Psychiatric/Behavioral:  Positive for depression and sleep disturbance. Negative for suicidal ideas. The patient is nervous/anxious.   All other systems reviewed and are negative.    VITALS:   Blood pressure 127/64, pulse 79, temperature 98.2 F (36.8 C), temperature source Oral, resp. rate 18, height '5\' 4"'$  (1.626 m), weight 250 lb (113.4 kg), SpO2 97 %.  Wt Readings from Last 3 Encounters:  08/03/22 250 lb (113.4 kg)  07/31/22 216 lb 11.2 oz (98.3 kg)  07/13/22 234 lb (106.1 kg)    Body mass index is 42.91 kg/m.  Performance status (ECOG): 1 - Symptomatic but completely ambulatory  PHYSICAL EXAM:   Physical Exam Vitals and nursing note reviewed. Exam conducted with a chaperone present.  Constitutional:      Appearance: Normal appearance.  Cardiovascular:     Rate and Rhythm: Normal rate and regular rhythm.     Pulses: Normal pulses.     Heart sounds: Normal heart sounds.  Pulmonary:     Effort: Pulmonary effort is normal.     Breath sounds: Normal breath sounds.  Abdominal:     Palpations: Abdomen is soft. There is no hepatomegaly, splenomegaly or mass.     Tenderness: There is no abdominal tenderness.  Musculoskeletal:     Right lower leg: No edema.     Left lower leg: No edema.  Lymphadenopathy:     Cervical: No cervical adenopathy.     Right cervical: No superficial, deep or posterior cervical adenopathy.    Left cervical: No superficial, deep or posterior cervical adenopathy.      Upper Body:     Right upper body: No supraclavicular or axillary adenopathy.     Left upper body: No supraclavicular or axillary adenopathy.  Neurological:     General: No focal deficit present.     Mental Status: She is alert and oriented to person, place, and time.  Psychiatric:        Mood and Affect: Mood normal.        Behavior: Behavior normal.   Wound VAC at left mastectomy site present.  LABS:      Latest Ref Rng & Units 07/30/2022    4:40 AM 07/28/2022    5:51 AM 07/27/2022    4:38 AM  CBC  WBC 4.0 - 10.5 K/uL 6.5  6.7  7.5   Hemoglobin 12.0 - 15.0 g/dL 10.1  10.4  10.3   Hematocrit 36.0 - 46.0 % 30.7  31.3  30.8   Platelets 150 - 400 K/uL 249  197  207  Latest Ref Rng & Units 07/30/2022    4:40 AM 07/28/2022    5:51 AM 07/27/2022    4:38 AM  CMP  Glucose 70 - 99 mg/dL 120  143  165   BUN 8 - 23 mg/dL '10  12  13   '$ Creatinine 0.44 - 1.00 mg/dL 0.99  0.94  1.14   Sodium 135 - 145 mmol/L 136  132  132   Potassium 3.5 - 5.1 mmol/L 4.0  4.0  3.6   Chloride 98 - 111 mmol/L 104  100  99   CO2 22 - 32 mmol/L '23  24  26   '$ Calcium 8.9 - 10.3 mg/dL 10.0  9.8  10.0      No results found for: "CEA1", "CEA" / No results found for: "CEA1", "CEA" No results found for: "PSA1" No results found for: "[id number]" No results found for: "CAN125"  No results found for: "TOTALPROTELP", "ALBUMINELP", "A1GS", "A2GS", "BETS", "BETA2SER", "GAMS", "MSPIKE", "SPEI" No results found for: "TIBC", "FERRITIN", "IRONPCTSAT" No results found for: "LDH"   STUDIES:   [place] EKG SITE RITE  Result Date: 07/30/2022 If Site Rite image not attached, placement could not be confirmed due to current cardiac rhythm.  CT Chest W Contrast  Result Date: 07/25/2022 CLINICAL DATA:  Status post left mastectomy on 2/5, now with infection, cellulitis versus abscess EXAM: CT CHEST WITH CONTRAST TECHNIQUE: Multidetector CT imaging of the chest was performed during intravenous contrast administration. RADIATION DOSE  REDUCTION: This exam was performed according to the departmental dose-optimization program which includes automated exposure control, adjustment of the mA and/or kV according to patient size and/or use of iterative reconstruction technique. CONTRAST:  32m OMNIPAQUE IOHEXOL 300 MG/ML  SOLN COMPARISON:  None Available. FINDINGS: Cardiovascular: The heart is normal in size. No pericardial effusion. No evidence of thoracic aortic aneurysm. Atherosclerotic calcifications of the aortic arch. Severe three-vessel coronary atherosclerosis. Postsurgical changes related to prior CABG. Mediastinum/Nodes: Small left axillary nodes measuring up to 8 mm short axis (series 2/images 30 and 52), warranting attention on follow-up. No suspicious mediastinal lymphadenopathy. Visualized thyroid is unremarkable. Lungs/Pleura: Mild centrilobular and paraseptal emphysematous changes, upper lung predominant. No suspicious pulmonary nodules. No focal consolidation. No pleural effusion or pneumothorax. Upper Abdomen: Visualized upper abdomen is grossly unremarkable, noting vascular calcifications. Musculoskeletal: Status post left mastectomy. Associated 5.5 x 14.4 x 7.9 cm fluid and gas collection in the surgical bed. While postoperative seroma is possible, given the clinical setting (approximally 1 month since initial surgery), this appearance is worrisome for postoperative infection/abscess. Overlying subcutaneous stranding with cutaneous thickening (series 2/image 87), suggesting associated cellulitis. Degenerative changes of the mid/lower thoracic spine. Median sternotomy. IMPRESSION: Status post left mastectomy. 5.5 x 14.4 x 7.9 cm fluid and gas collection in the surgical bed, worrisome for postoperative infection/abscess. Associated cellulitis. Small left axillary nodes measuring up to 8 mm short axis, possibly reactive, but warranting attention on follow-up. Aortic Atherosclerosis (ICD10-I70.0) and Emphysema (ICD10-J43.9).  Electronically Signed   By: SJulian HyM.D.   On: 07/25/2022 18:09   DG Chest Port 1 View  Result Date: 07/25/2022 CLINICAL DATA:  Sepsis. EXAM: PORTABLE CHEST 1 VIEW COMPARISON:  Chest x-ray 12/06/2021 and older FINDINGS: Status post median sternotomy. Mediastinal clips. Overlapping cardiac leads. No consolidation, pneumothorax or effusion. No edema. Normal cardiopericardial silhouette. Eventration of the right hemidiaphragm. IMPRESSION: No acute cardiopulmonary disease Electronically Signed   By: AJill SideM.D.   On: 07/25/2022 17:23

## 2022-08-03 NOTE — Patient Instructions (Signed)
[facility]  Discharge Instructions  You were seen and examined today by Dr. Delton Coombes.  Dr. Delton Coombes discussed your most recent lab work which revealed that everything looks good.  Follow-up as scheduled in 2 months with labs.    Thank you for choosing [facility] to provide your oncology and hematology care.   To afford each patient quality time with our provider, please arrive at least 15 minutes before your scheduled appointment time. You may need to reschedule your appointment if you arrive late (10 or more minutes). Arriving late affects you and other patients whose appointments are after yours.  Also, if you miss three or more appointments without notifying the office, you may be dismissed from the clinic at the provider's discretion.    Again, thank you for choosing [facility].  Our hope is that these requests will decrease the amount of time that you wait before being seen by our physicians.   If you have a lab appointment with the [facility] please come in thru the Main Entrance and check in at the main information desk.           _____________________________________________________________  Should you have questions after your visit to [facility], please contact our office at [phone] and follow the prompts.  Our office hours are 8:00 a.m. to 4:30 p.m. Monday - Thursday and 8:00 a.m. to 2:30 p.m. Friday.  Please note that voicemails left after 4:00 p.m. may not be returned until the following business day.  We are closed weekends and all major holidays.  You do have access to a nurse 24-7, just call the main number to the clinic [phone] and do not press any options, hold on the line and a nurse will answer the phone.    For prescription refill requests, have your pharmacy contact our office and allow 72 hours.    Masks are optional in the cancer centers. If you would like for your  care team to wear a mask while they are taking care of you, please let them know. You may have one support person who is at least 72 years old accompany you for your appointments.

## 2022-08-06 ENCOUNTER — Encounter (HOSPITAL_COMMUNITY): Payer: Self-pay | Admitting: General Surgery

## 2022-08-17 ENCOUNTER — Other Ambulatory Visit: Payer: Self-pay

## 2022-08-17 ENCOUNTER — Ambulatory Visit (INDEPENDENT_AMBULATORY_CARE_PROVIDER_SITE_OTHER): Payer: Medicare HMO | Admitting: Infectious Diseases

## 2022-08-17 ENCOUNTER — Encounter: Payer: Self-pay | Admitting: Infectious Diseases

## 2022-08-17 ENCOUNTER — Ambulatory Visit: Payer: Medicare HMO | Admitting: Infectious Diseases

## 2022-08-17 VITALS — BP 135/80 | HR 73 | Resp 16 | Ht 64.0 in | Wt 250.0 lb

## 2022-08-17 DIAGNOSIS — Z452 Encounter for adjustment and management of vascular access device: Secondary | ICD-10-CM | POA: Insufficient documentation

## 2022-08-17 DIAGNOSIS — Z79899 Other long term (current) drug therapy: Secondary | ICD-10-CM

## 2022-08-17 DIAGNOSIS — N611 Abscess of the breast and nipple: Secondary | ICD-10-CM

## 2022-08-17 NOTE — Progress Notes (Incomplete)
Patient Active Problem List   Diagnosis Date Noted   Hematoma of left breast 07/26/2022   Abscess after procedure 07/25/2022   Tobacco use disorder 07/25/2022   Thyromegaly 07/25/2022   S/P left mastectomy 06/25/2022   Breast cancer of upper-outer quadrant of left female breast ([place]) 12/10/2021   Acute coronary syndrome ([place])    Type 2 diabetes mellitus with complication, with long-term current use of insulin ([place]) 11/16/2021   Hypertension 11/16/2021   Hyperlipidemia 11/16/2021   Morbid obesity ([place]) 11/16/2021   Acute myocardial infarction involving left main coronary artery ([place]) 11/16/2021   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   Coronary artery disease 11/16/2021   Non-ST elevation (NSTEMI) myocardial infarction [facility])     Patient's Medications  New Prescriptions   No medications on file  Previous Medications   ACETAMINOPHEN (TYLENOL) 325 MG TABLET    Take 2 tablets (650 mg total) by mouth every 6 (six) hours as needed for mild pain (or Fever >/= 101).   ANASTROZOLE (ARIMIDEX) 1 MG TABLET    Take 1 tablet (1 mg total) by mouth daily.   ASCORBIC ACID (VITAMIN C) 1000 MG TABLET    Take 1,000 mg by mouth daily.   ASPIRIN EC 81 MG TABLET    Take 1 tablet (81 mg total) by mouth daily. Swallow whole.   BACLOFEN (LIORESAL) 10 MG TABLET    Take 10 mg by mouth 2 (two) times daily as needed.   CHOLECALCIFEROL (VITAMIN D3) 25 MCG (1000 UNIT) TABLET    Take 1,000 Units by mouth daily.   CLOPIDOGREL (PLAVIX) 75 MG TABLET    TAKE 1 TABLET BY MOUTH EVERY DAY   CYCLOBENZAPRINE (FLEXERIL) 10 MG TABLET    Take 10 mg by mouth 2 (two) times daily as needed for muscle spasms.   DULOXETINE (CYMBALTA) 60 MG CAPSULE    Take 60 mg by mouth daily.   EZETIMIBE (ZETIA) 10 MG TABLET    Take 10 mg by mouth daily.   FUROSEMIDE (LASIX) 40 MG TABLET    TAKE 1 AND 1/2 TABLETS(60 MG) BY MOUTH DAILY   GLIMEPIRIDE (AMARYL) 4 MG TABLET    Take 4 mg by mouth daily with breakfast.   HYDROXYZINE (ATARAX) 25 MG TABLET     Take 25 mg by mouth daily.   ISOSORBIDE MONONITRATE (IMDUR) 30 MG 24 HR TABLET    Take 1 tablet (30 mg total) by mouth daily.   LANTUS SOLOSTAR 100 UNIT/ML SOLOSTAR PEN    Inject 25 Units into the skin at bedtime.   LUBIPROSTONE (AMITIZA) 24 MCG CAPSULE    Take 24 mcg by mouth 2 (two) times daily.   METFORMIN (GLUCOPHAGE) 500 MG TABLET    Take 500-1,000 mg by mouth See admin instructions. Take 2 tablets (1000 mg) in the morning and Take 1 tablets (500 mg) in the evening   METOPROLOL TARTRATE (LOPRESSOR) 25 MG TABLET    Take 1 tablet (25 mg total) by mouth 2 (two) times daily.   NICOTINE (NICODERM CQ - DOSED IN MG/24 HOURS) 21 MG/24HR PATCH    Place 1 patch (21 mg total) onto the skin daily.   NITROGLYCERIN (NITROSTAT) 0.4 MG SL TABLET    Place 1 tablet (0.4 mg total) under the tongue every 5 (five) minutes x 3 doses as needed for chest pain (if no relief after 3rd dose, proceed to ED or call 911).   ONDANSETRON (ZOFRAN-ODT) 4 MG DISINTEGRATING TABLET    Take 4  mg by mouth every 8 (eight) hours as needed for vomiting or nausea. For 7 days supply   OXYCODONE-ACETAMINOPHEN (PERCOCET) 10-325 MG TABLET    Take 1 tablet by mouth every 4 (four) hours as needed for pain.   ROSUVASTATIN (CRESTOR) 10 MG TABLET    Take 10 mg by mouth daily.   VITAMIN B-12 (CYANOCOBALAMIN) 1000 MCG TABLET    Take 1,000 mcg by mouth daily.  Modified Medications   No medications on file  Discontinued Medications   No medications on file    Subjective: 72 Y O female with PMH as below including type 2 DM, CAD s/p CABG who is here for HFU for left breast abscess. She had a mastectomy and axillary lymph node biopsy  in Jun 25, 2022 3/4 s/p  Incision and debridement of left mastectomy wound, wound VAC placement. Cx MRSA, PsA, Proteus mirabilis, Prevotella bivia. Discharged on 3/8 with IV Vancomycin, cefepime and PO metronidazole.   Completed IV Vancomycin, cefepime and PO metronidazole on 08/16/22. Reports being seen by surgeon 2  week ago with no concerns reported and has an upcoming fu appt. Denies any concerns with PICC.  Has chills and night sweats but that has been ongoing for almost a year after CABG. Nausea +, denies vomiting, abdominal pain and diarrhea.   Review of Systems: all systems reviewed with pertinent positives and negatives as listed above   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   COPD (chronic obstructive pulmonary disease) ([place])    Depression    Essential hypertension    Hyperlipidemia    Myocardial infarction ([place])    [place] to the LAD   OSA (obstructive sleep apnea)    CPAP qHS   PONV (postoperative nausea and vomiting)    Type 2 diabetes mellitus ([place])    Past Surgical History:  Procedure Laterality Date   BACK SURGERY     BREAST SURGERY     CATARACT EXTRACTION W/PHACO Right 10/24/2020   Procedure: CATARACT EXTRACTION PHACO AND INTRAOCULAR LENS PLACEMENT ([place]);  Surgeon: Baruch Goldmann, MD;  Location: [facility];  Service: Ophthalmology;  Laterality: Right;  CDE: 10.87   CATARACT EXTRACTION W/PHACO Left 12/01/2020   Procedure: CATARACT EXTRACTION PHACO AND INTRAOCULAR LENS PLACEMENT LEFT EYE;  Surgeon: Baruch Goldmann, MD;  Location: [facility];  Service: Ophthalmology;  Laterality: Left;  CDE  8.62   CORONARY ARTERY BYPASS GRAFT N/A 11/16/2021   Procedure: CORONARY ARTERY BYPASS GRAFTING (CABG) times three using the left internal mammary and right saphenous vein.;  Surgeon: Gaye Pollack, MD;  Location: [facility];  Service: Open Heart Surgery;  Laterality: N/A;   IABP INSERTION N/A 11/16/2021   Procedure: IABP Insertion;  Surgeon: Leonie Man, MD;  Location: [place] [facility];  Service: Cardiovascular;  Laterality: N/A;   LEFT HEART CATH AND CORONARY  ANGIOGRAPHY N/A 11/16/2021   Procedure: LEFT HEART CATH AND CORONARY ANGIOGRAPHY;  Surgeon: Leonie Man, MD;  Location: [place] [facility];  Service: Cardiovascular;  Laterality: N/A;   REPLACEMENT TOTAL KNEE Right    SIMPLE MASTECTOMY WITH AXILLARY SENTINEL NODE BIOPSY Left 06/25/2022   Procedure: SIMPLE MASTECTOMY;  Surgeon: Aviva Signs, MD;  Location: [facility];  Service: General;  Laterality: Left;  TEE WITHOUT CARDIOVERSION N/A 11/16/2021   Procedure: TRANSESOPHAGEAL ECHOCARDIOGRAM (TEE);  Surgeon: Gaye Pollack, MD;  Location: [facility];  Service: Open Heart Surgery;  Laterality: N/A;   TUBAL LIGATION     WOUND EXPLORATION Left 07/26/2022   Procedure: EVACUATION OF HEMATOMA BREAST, DEBRIDEMENT OF LEFT BREAST WOUND;  Surgeon: Aviva Signs, MD;  Location: [facility];  Service: General;  Laterality: Left;    Social History   Tobacco Use   Smoking status: Every Day    Packs/day: 0.50    Years: 40.00    Additional pack years: 0.00    Total pack years: 20.00    Types: Cigarettes   Smokeless tobacco: Never  Vaping Use   Vaping Use: Never used  Substance Use Topics   Alcohol use: Never   Drug use: Never    Family History  Problem Relation Age of Onset   COPD Mother    Alcohol abuse Father    Hypertension Sister    Heart attack Brother     Allergies  Allergen Reactions   Latex Rash    Health Maintenance  Topic Date Due   Pneumonia Vaccine 29+ Years old (1 of 2 - PCV) Never done   FOOT EXAM  Never done   OPHTHALMOLOGY EXAM  Never done   Diabetic kidney evaluation - Urine ACR  Never done   Hepatitis C Screening  Never done   DTaP/Tdap/Td (1 - Tdap) Never done   COLONOSCOPY (Pts 45-64yrs Insurance coverage will need to be confirmed)  Never done   COVID-19 Vaccine (3 - Moderna risk series) 03/05/2021   Medicare Annual Wellness (AWV)  08/11/2022   HEMOGLOBIN A1C  12/24/2022   Lung Cancer Screening  07/25/2023   Diabetic kidney evaluation - eGFR measurement  07/30/2023    MAMMOGRAM  10/28/2023   INFLUENZA VACCINE  Completed   DEXA SCAN  Completed   Zoster Vaccines- Shingrix  Completed   HPV VACCINES  Aged Out    Objective:  There were no vitals filed for this visit. There is no height or weight on file to calculate BMI.  Physical Exam Constitutional:      Appearance: Normal appearance.  HENT:     Head: Normocephalic and atraumatic.      Mouth: Mucous membranes are moist.  Eyes:    Conjunctiva/sclera: Conjunctivae normal.     Pupils: Pupils are equal, round, and reactive to light.   Cardiovascular:     Rate and Rhythm: Normal rate and regular rhythm.     Heart sounds: No murmur heard. No friction rub. No gallop.   Pulmonary:     Effort: Pulmonary effort is normal.     Breath sounds: Normal breath sounds.   Abdominal:     General: Non distended     Palpations: soft.   Musculoskeletal:        General: Normal range of motion.   Skin:    General: Skin is warm and dry.     Comments:  Neurological:     General: grossly non focal     Mental Status: awake, alert and oriented to person, place, and time.   Psychiatric:        Mood and Affect: Mood normal.   Lab Results Lab Results  Component Value Date   WBC 6.5 07/30/2022   HGB 10.1 (L) 07/30/2022   HCT 30.7 (L) 07/30/2022   MCV 94.8 07/30/2022   PLT 249 07/30/2022    Lab Results  Component Value Date   CREATININE  0.99 07/30/2022   BUN 10 07/30/2022   NA 136 07/30/2022   K 4.0 07/30/2022   CL 104 07/30/2022   CO2 23 07/30/2022    Lab Results  Component Value Date   ALT 12 07/26/2022   AST 14 (L) 07/26/2022   ALKPHOS 48 07/26/2022   BILITOT 0.6 07/26/2022    Lab Results  Component Value Date   CHOL 130 05/18/2022   HDL 37 (L) 05/18/2022   LDLCALC 74 05/18/2022   TRIG 97 05/18/2022   CHOLHDL 3.5 05/18/2022   No results found for: "LABRPR", "RPRTITER" No results found for: "HIV1RNAQUANT", "HIV1RNAVL", "CD4TABS"   Microbiology  Results for orders placed or  performed during the hospital encounter of 07/25/22  Blood Culture (routine x 2)     Status: None   Collection Time: 07/25/22  5:09 PM   Specimen: Site Not Specified; Blood  Result Value Ref Range Status   Specimen Description   Final    SITE NOT SPECIFIED BOTTLES DRAWN AEROBIC AND ANAEROBIC   Special Requests Blood Culture adequate volume  Final   Culture   Final    NO GROWTH 5 DAYS Performed at [facility], [address], [place], [place] [address]    Report Status 07/30/2022 FINAL  Final  Blood Culture (routine x 2)     Status: None   Collection Time: 07/25/22  5:14 PM   Specimen: BLOOD RIGHT ARM  Result Value Ref Range Status   Specimen Description   Final    BLOOD RIGHT ARM BOTTLES DRAWN AEROBIC AND ANAEROBIC   Special Requests Blood Culture adequate volume  Final   Culture   Final    NO GROWTH 5 DAYS Performed at [facility], [address] [place], [place] [address]    Report Status 07/30/2022 FINAL  Final  Aerobic Culture w Gram Stain (superficial specimen)     Status: None   Collection Time: 07/25/22  5:30 PM   Specimen: Breast  Result Value Ref Range Status   Specimen Description   Final    BREAST Performed at [facility], [address], [place], [place] [address]    Special Requests   Final    NONE Performed at [facility], [address] [place], [place] [address]    Gram Stain   Final    FEW WBC PRESENT,BOTH PMN AND MONONUCLEAR MODERATE GRAM POSITIVE COCCI IN CLUSTERS RARE GRAM VARIABLE ROD Performed at [facility], [place] [address] [place], [place] [address]    Culture   Final    MODERATE METHICILLIN RESISTANT STAPHYLOCOCCUS AUREUS MODERATE PSEUDOMONAS AERUGINOSA FEW PROTEUS MIRABILIS    Report Status 07/28/2022 FINAL  Final   Organism ID, Bacteria METHICILLIN RESISTANT STAPHYLOCOCCUS AUREUS  Final   Organism ID, Bacteria PSEUDOMONAS AERUGINOSA  Final   Organism ID, Bacteria PROTEUS MIRABILIS  Final      Susceptibility   Methicillin  resistant staphylococcus aureus - MIC*    CIPROFLOXACIN <=0.5 SENSITIVE Sensitive     ERYTHROMYCIN >=8 RESISTANT Resistant     GENTAMICIN <=0.5 SENSITIVE Sensitive     OXACILLIN >=4 RESISTANT Resistant     TETRACYCLINE <=1 SENSITIVE Sensitive     VANCOMYCIN 1 SENSITIVE Sensitive     TRIMETH/SULFA <=10 SENSITIVE Sensitive     CLINDAMYCIN <=0.25 SENSITIVE Sensitive     RIFAMPIN <=0.5 SENSITIVE Sensitive     Inducible Clindamycin NEGATIVE Sensitive     * MODERATE METHICILLIN RESISTANT STAPHYLOCOCCUS AUREUS   Pseudomonas aeruginosa - MIC*  CEFTAZIDIME 4 SENSITIVE Sensitive     CIPROFLOXACIN <=0.25 SENSITIVE Sensitive     GENTAMICIN <=1 SENSITIVE Sensitive     IMIPENEM 2 SENSITIVE Sensitive     PIP/TAZO 8 SENSITIVE Sensitive     CEFEPIME 2 SENSITIVE Sensitive     * MODERATE PSEUDOMONAS AERUGINOSA   Proteus mirabilis - MIC*    AMPICILLIN >=32 RESISTANT Resistant     CEFEPIME 1 SENSITIVE Sensitive     CEFTAZIDIME <=1 SENSITIVE Sensitive     CEFTRIAXONE <=0.25 SENSITIVE Sensitive     CIPROFLOXACIN >=4 RESISTANT Resistant     GENTAMICIN <=1 SENSITIVE Sensitive     IMIPENEM 2 SENSITIVE Sensitive     TRIMETH/SULFA >=320 RESISTANT Resistant     AMPICILLIN/SULBACTAM >=32 RESISTANT Resistant     PIP/TAZO 64 INTERMEDIATE Intermediate     * FEW PROTEUS MIRABILIS  Surgical PCR screen     Status: Abnormal   Collection Time: 07/26/22  8:40 AM   Specimen: Nasal Mucosa; Nasal Swab  Result Value Ref Range Status   MRSA, PCR POSITIVE (A) NEGATIVE Final   Staphylococcus aureus POSITIVE (A) NEGATIVE Final    Comment: RESULT CALLED TO, READ BACK BY AND VERIFIED WITH: PATE, RN @ [id number] ON 07/26/22 C VARNER (NOTE) The Xpert SA Assay (FDA approved for NASAL specimens in patients 77 years of age and older), is one component of a comprehensive surveillance program. It is not intended to diagnose infection nor to guide or monitor treatment. Performed at [facility], [address], [place], [place]  [address]   Aerobic/Anaerobic Culture w Gram Stain (surgical/deep wound)     Status: None   Collection Time: 07/26/22 12:58 PM   Specimen: PATH Other; Tissue  Result Value Ref Range Status   Specimen Description   Final    WOUND BREAST Performed at [facility], [address] [place], [place] [address]    Special Requests   Final    NONE Performed at [facility], [address], [place], [place] [address]    Gram Stain   Final    NO WBC SEEN FEW GRAM POSITIVE COCCI IN PAIRS FEW GRAM POSITIVE COCCI IN CHAINS FEW GRAM POSITIVE RODS RARE GRAM NEGATIVE RODS    Culture   Final    MODERATE METHICILLIN RESISTANT STAPHYLOCOCCUS AUREUS MODERATE PROTEUS MIRABILIS MODERATE PSEUDOMONAS AERUGINOSA FEW PREVOTELLA BIVIA BETA LACTAMASE POSITIVE Performed at [facility], [place] [address] [place], [place] [address]    Report Status 07/29/2022 FINAL  Final   Organism ID, Bacteria METHICILLIN RESISTANT STAPHYLOCOCCUS AUREUS  Final   Organism ID, Bacteria PROTEUS MIRABILIS  Final   Organism ID, Bacteria PSEUDOMONAS AERUGINOSA  Final      Susceptibility   Methicillin resistant staphylococcus aureus - MIC*    CIPROFLOXACIN <=0.5 SENSITIVE Sensitive     ERYTHROMYCIN >=8 RESISTANT Resistant     GENTAMICIN <=0.5 SENSITIVE Sensitive     OXACILLIN >=4 RESISTANT Resistant     TETRACYCLINE <=1 SENSITIVE Sensitive     VANCOMYCIN 1 SENSITIVE Sensitive     TRIMETH/SULFA <=10 SENSITIVE Sensitive     CLINDAMYCIN <=0.25 SENSITIVE Sensitive     RIFAMPIN <=0.5 SENSITIVE Sensitive     Inducible Clindamycin NEGATIVE Sensitive     * MODERATE METHICILLIN RESISTANT STAPHYLOCOCCUS AUREUS   Pseudomonas aeruginosa - MIC*    CEFTAZIDIME 4 SENSITIVE Sensitive     CIPROFLOXACIN <=0.25 SENSITIVE Sensitive     GENTAMICIN <=1 SENSITIVE Sensitive     IMIPENEM 1 SENSITIVE Sensitive  PIP/TAZO 8 SENSITIVE Sensitive     CEFEPIME 2 SENSITIVE Sensitive     * MODERATE PSEUDOMONAS AERUGINOSA   Proteus mirabilis -  MIC*    AMPICILLIN >=32 RESISTANT Resistant     CEFEPIME 0.25 SENSITIVE Sensitive     CEFTAZIDIME <=1 SENSITIVE Sensitive     CEFTRIAXONE <=0.25 SENSITIVE Sensitive     CIPROFLOXACIN >=4 RESISTANT Resistant     GENTAMICIN <=1 SENSITIVE Sensitive     IMIPENEM 2 SENSITIVE Sensitive     TRIMETH/SULFA >=320 RESISTANT Resistant     AMPICILLIN/SULBACTAM >=32 RESISTANT Resistant     PIP/TAZO <=4 SENSITIVE Sensitive     * MODERATE PROTEUS MIRABILIS   Imaging [place] EKG SITE RITE  Result Date: 07/30/2022 If Site Rite image not attached, placement could not be confirmed due to current cardiac rhythm.  CT Chest W Contrast  Result Date: 07/25/2022 CLINICAL DATA:  Status post left mastectomy on 2/5, now with infection, cellulitis versus abscess EXAM: CT CHEST WITH CONTRAST TECHNIQUE: Multidetector CT imaging of the chest was performed during intravenous contrast administration. RADIATION DOSE REDUCTION: This exam was performed according to the departmental dose-optimization program which includes automated exposure control, adjustment of the mA and/or kV according to patient size and/or use of iterative reconstruction technique. CONTRAST:  77mL OMNIPAQUE IOHEXOL 300 MG/ML  SOLN COMPARISON:  None Available. FINDINGS: Cardiovascular: The heart is normal in size. No pericardial effusion. No evidence of thoracic aortic aneurysm. Atherosclerotic calcifications of the aortic arch. Severe three-vessel coronary atherosclerosis. Postsurgical changes related to prior CABG. Mediastinum/Nodes: Small left axillary nodes measuring up to 8 mm short axis (series 2/images 30 and 52), warranting attention on follow-up. No suspicious mediastinal lymphadenopathy. Visualized thyroid is unremarkable. Lungs/Pleura: Mild centrilobular and paraseptal emphysematous changes, upper lung predominant. No suspicious pulmonary nodules. No focal consolidation. No pleural effusion or pneumothorax. Upper Abdomen: Visualized upper abdomen is grossly  unremarkable, noting vascular calcifications. Musculoskeletal: Status post left mastectomy. Associated 5.5 x 14.4 x 7.9 cm fluid and gas collection in the surgical bed. While postoperative seroma is possible, given the clinical setting (approximally 1 month since initial surgery), this appearance is worrisome for postoperative infection/abscess. Overlying subcutaneous stranding with cutaneous thickening (series 2/image 87), suggesting associated cellulitis. Degenerative changes of the mid/lower thoracic spine. Median sternotomy. IMPRESSION: Status post left mastectomy. 5.5 x 14.4 x 7.9 cm fluid and gas collection in the surgical bed, worrisome for postoperative infection/abscess. Associated cellulitis. Small left axillary nodes measuring up to 8 mm short axis, possibly reactive, but warranting attention on follow-up. Aortic Atherosclerosis (ICD10-I70.0) and Emphysema (ICD10-J43.9). Electronically Signed   By: Julian Hy M.D.   On: 07/25/2022 18:09   DG Chest Port 1 View  Result Date: 07/25/2022 CLINICAL DATA:  Sepsis. EXAM: PORTABLE CHEST 1 VIEW COMPARISON:  Chest x-ray 12/06/2021 and older FINDINGS: Status post median sternotomy. Mediastinal clips. Overlapping cardiac leads. No consolidation, pneumothorax or effusion. No edema. Normal cardiopericardial silhouette. Eventration of the right hemidiaphragm. IMPRESSION: No acute cardiopulmonary disease Electronically Signed   By: Jill Side M.D.   On: 07/25/2022 17:23    Assessment/Plan    I have personally spent more than 70 minutes involved in face-to-face and non-face-to-face activities for this patient on the day of the visit. Professional time spent includes the following activities: Preparing to see the patient (review of tests), Obtaining and/or reviewing separately obtained history (admission/discharge record), Performing a medically appropriate examination and/or evaluation , Ordering medications/tests/procedures, referring and communicating  with other health care professionals, Documenting clinical information in the  EMR, Independently interpreting results (not separately reported), Communicating results to the patient/family/caregiver, Counseling and educating the patient/family/caregiver and Care coordination (not separately reported).   Wilber Oliphant, [place] [facility] 08/17/2022, 1:21 PM

## 2022-08-25 ENCOUNTER — Encounter: Payer: Self-pay | Admitting: *Deleted

## 2022-08-25 ENCOUNTER — Ambulatory Visit: Payer: Medicare HMO | Admitting: Cardiology

## 2022-08-25 ENCOUNTER — Telehealth: Payer: Self-pay | Admitting: *Deleted

## 2022-08-25 NOTE — Telephone Encounter (Signed)
Received call from [place], [facility] SN with [facility] [phone]~ telephone.   Reports that [facility] evaluation orders have been received for [facility] PT/OT/ SN for wound care. States that start of care has been delayed to 08/26/2022.  Advised that Dr. Arnoldo Morale will sign orders for wound care to Left mastectomy site and wound vac.   Advised to contact PCP for PT/OT orders.

## 2022-08-26 NOTE — Telephone Encounter (Signed)
Received call from Judson Roch [facility] SN with [facility] [phone]~ telephone.   Reports that wound vac is ordered to be changed on MWF. States that she is changing today as start of care was delayed.   Reports that wound bed is pale and pink. States that she is concerned that wound may be as deep as muscle tissue. Also report that wound is tracking towards center of chest x7 cm.   Inquired if patient should be using adaptive or white foam in wound vac. Advised that patient has F/U with Dr. Arnoldo Morale on 08/31/2022 and we will evaluate then.   Also states that patient is running out of supplies and inquired as to DME supplier. Advised that VAC came from facility prior to patient discharge home.   Call placed to [facility] [phone]) [phone]~ telephone to inquire. LM for return call to SW.

## 2022-08-30 NOTE — Telephone Encounter (Signed)
Call placed to [facility] [phone]~ telephone to inquire. LM for return call to SW.

## 2022-08-31 ENCOUNTER — Ambulatory Visit (INDEPENDENT_AMBULATORY_CARE_PROVIDER_SITE_OTHER): Payer: Medicare HMO | Admitting: General Surgery

## 2022-08-31 ENCOUNTER — Encounter: Payer: Self-pay | Admitting: General Surgery

## 2022-08-31 VITALS — BP 132/78 | HR 66 | Temp 97.6°F | Resp 16 | Ht 64.0 in | Wt 219.0 lb

## 2022-08-31 DIAGNOSIS — Z09 Encounter for follow-up examination after completed treatment for conditions other than malignant neoplasm: Secondary | ICD-10-CM

## 2022-08-31 NOTE — Progress Notes (Signed)
Subjective:     Katelyn Kaufman  Here for postoperative visit, status post wound VAC placement for infected wound hematoma/seroma, status post left modified radical mastectomy.  Patient is now home.  This morning she woke up complaining of significant fluid on the bed. Objective:    BP 132/78   Pulse 66   Temp 97.6 F (36.4 C) (Oral)   Resp 16   Ht 5\' 4"  (1.626 m)   Wt 219 lb (99.3 kg)   SpO2 97%   BMI 37.59 kg/m   General:  alert, cooperative, and no distress  Left mastectomy wound with wound VAC in place but not sealed.  Wound is smaller.  It does extend down to the chest wall.  Wound sponge was removed.  Good granulation tissue noted at base.  No purulent drainage present.  Normal saline wet-to-dry dressing placed.     Assessment:    Doing well postoperatively.    Plan:   Will stop wound VAC treatment.  Will do normal saline wet-to-dry dressings every Monday/Wednesday/Friday.  Follow-up here in 3 weeks for wound check.

## 2022-08-31 NOTE — Telephone Encounter (Signed)
Call placed to facility and spoke with SW, Venica. Reports that DME supplier is [employer].

## 2022-09-01 NOTE — Telephone Encounter (Signed)
Patient seen in office by Dr. Lovell Sheehan.  New wound care orders as follows: Will stop wound VAC treatment. Will do normal saline wet-to-dry dressings every Monday/Wednesday/Friday. Follow-up here in 3 weeks for wound check.   Call placed to Maralyn Sago, [facility] SN with [facility]. Verbal orders given.

## 2022-09-02 ENCOUNTER — Telehealth: Payer: Self-pay

## 2022-09-02 NOTE — Telephone Encounter (Signed)
Received call from Palliative RN  that patient discharged from Rehab yesterday, home today and having copious drainage through her clothes of left breast wound along with night sweats with foul odor(reported by patient)  Advised that [facility] staff will contact [facility] office for nurse visit to evaluate wound. Per [facility] schedule is M-W-F and they have a nurse scheduled to visit patient today. Asked if they would follow up with [facility] to let us know how the wound looks. Patient made aware that [facility] will be at her home today to change dressing and evaluate wound. Advised patient if she feel as if her symptoms change and starts to develop any nausea, vomiting, diarrhea, or fever greater than 100.4 that she needs to be evaluated in the ED. Patient verbalized understanding.  Valarie Cones, LPN

## 2022-09-07 ENCOUNTER — Telehealth: Payer: Self-pay | Admitting: *Deleted

## 2022-09-07 NOTE — Telephone Encounter (Signed)
I have not seen patient since hospitalization and that was brief.  Drainage from wound may not be purulence but could be fibrinous drainage and just indicate that it needs packing more often.  I suspect if they are only changing it three times a week the drainage is due to the fact that it needs changed daily or more often. I would monitor for redness around the wound/ cellulitis and use that as an indicator for if it was infected. I can see the patient to look at the wound if the patient wants to come to clinic, but I understand she is palliative. I would recommend starting with packing wound daily over starting antibiotics.

## 2022-09-07 NOTE — Telephone Encounter (Signed)
Call placed to patient to follow up.   Patient denies hardness, warmth to touch or redness to surrounding skin. Advised to increase wet to dry dressing changes to daily.   Call placed to Maralyn Sago [facility] with [facility] [phone]~ telephone. States that she is scheduled to see patient on 09/08/2022. States that she will evaluate wound and dressing/ drainage and will report back to [facility].   Call placed to Clydie Braun with [facility] [phone] telephone. LM ON VM.

## 2022-09-07 NOTE — Telephone Encounter (Signed)
Surgical Date: 07/26/2022 Procedure: Evacuation Hematoma, Breast status post wound VAC placement for infected wound hematoma/seroma, status post left modified radical mastectomy  Allergies: Latex Pharmacy: [facility]  Received call from Clydie Braun with [facility] [phone] telephone.   Reports that patient has been admitted to Palliative Care Program. States that wound has been noted to have drainage, but now has purulent, foul smelling drainage of yellow, green and dark brown in color. Also reports that patient is having night sweats and chills. States that patient reports feeling feverish, but has not been able to check temperature d/t loss of sight.   Patient has been discharged from Infectious Disease.   Clydie Braun requesting ABTx for patient.   Please advise.

## 2022-09-07 NOTE — Telephone Encounter (Signed)
Clydie Braun with [facility] called to report that amount of drainage has decreased but it is purulent, yellow, green and brown. Patient is having night sweats and feel feverish. Clydie Braun is going to call and update patient surgeon as well. Please advise.   Clydie Braun call back number [phone]  Marcell Anger, CMA

## 2022-09-07 NOTE — Telephone Encounter (Signed)
Left message with Joice Lofts, Receptionist at [place] asking for Clydie Braun to return my cal..

## 2022-09-07 NOTE — Telephone Encounter (Signed)
Patient stated that she is currently speaking to her surgeons office and trying to be seen by them today if not patient is aware to go to ED per Dr.Manandhar.    I tried to call to inform Clydie Braun, RN at [place] - she is currently out of the office, will try again after lunch.   Theron Cumbie Lesli Albee, CMA

## 2022-09-08 NOTE — Telephone Encounter (Signed)
Received call from Maralyn Sago [facility] with [facility] [phone]~ telephone.   Reports that wound was evaluated today and noted much improved. Reports slight yellow colored drainage to gauze. No purulent discharge noted. No redness to surrounding tissues, warmth or hardness to surrounding tissues noted. Patient afebrile.   Reports wound bed beefy red. Reports that prior wound tracking towards center of chest hs decreased in size to 2.5 cm.   Advised to continue daily wet to dry dressing changes. Reports that education was given to patient on importance of dressing changes.   Call placed to Clydie Braun with [facility] [phone] telephone. Advised that Clydie Braun is out of the office for 09/08/2022. Message left with [facility] that [facility] has evaluated wound and no S/Sx of infection present.

## 2022-09-16 ENCOUNTER — Ambulatory Visit: Payer: Medicare HMO | Attending: Cardiology | Admitting: Cardiology

## 2022-09-16 ENCOUNTER — Encounter: Payer: Self-pay | Admitting: Cardiology

## 2022-09-16 VITALS — BP 118/64 | HR 92 | Ht 64.0 in | Wt 218.0 lb

## 2022-09-16 DIAGNOSIS — I10 Essential (primary) hypertension: Secondary | ICD-10-CM

## 2022-09-16 DIAGNOSIS — E782 Mixed hyperlipidemia: Secondary | ICD-10-CM

## 2022-09-16 DIAGNOSIS — I25119 Atherosclerotic heart disease of native coronary artery with unspecified angina pectoris: Secondary | ICD-10-CM

## 2022-09-16 DIAGNOSIS — G4733 Obstructive sleep apnea (adult) (pediatric): Secondary | ICD-10-CM

## 2022-09-16 NOTE — Progress Notes (Signed)
    Cardiology Office Note  Date: 09/16/2022   ID: Katelyn Kaufman, DOB 1950-08-01, MRN [id number]  History of Present Illness: Katelyn Kaufman is a 72 y.o. female last seen in December 2023 by [name], I reviewed the note.  She is here for a routine visit.  From a cardiac perspective, she reports no angina on medical therapy, states that she has been compliant with her regimen.  NYHA class II dyspnea, no palpitations or syncope.  She has stage II left breast cancer followed by oncology, I reviewed the most recent notes.  She underwent simple left mastectomy in February and is currently on anastrozole.  She is on Crestor and Zetia, last LDL had come down to 74 in December 2023.  We will plan to reassess this at next visit.  States that she has not used CPAP for treatment of OSA in many years, plan to get her in for a follow-up sleep medicine consultation.  She complains of headaches and also sweats at nighttime, not sleeping well.  Physical Exam: VS:  BP 118/64   Pulse 92   Ht  (1.626 m)   Wt 218 lb (98.9 kg)   SpO2 100%   BMI 37.42 kg/m , BMI Body mass index is 37.42 kg/m.  Wt Readings from Last 3 Encounters:  09/16/22 218 lb (98.9 kg)  08/31/22 219 lb (99.3 kg)  08/17/22 250 lb (113.4 kg)    General: Patient appears comfortable at rest. HEENT: Conjunctiva and lids normal. Neck: Supple, no elevated JVP or carotid bruits. Lungs: Clear to auscultation, nonlabored breathing at rest. Cardiac: Regular rate and rhythm, no S3, 1/6 significant systolic murmur. Extremities: No pitting edema.  ECG:  An ECG dated 07/25/2022 was personally reviewed today and demonstrated:  Sinus rhythm with PVC.  Labwork: 07/26/2022: ALT 12; AST 14; TSH 1.040 07/28/2022: Magnesium 2.0 07/30/2022: BUN 10; Creatinine, Ser 0.99; Hemoglobin 10.1; Platelets 249; Potassium 4.0; Sodium 136     Component Value Date/Time   CHOL 130 05/18/2022 1428   TRIG 97 05/18/2022 1428   HDL 37 (L) 05/18/2022 1428    CHOLHDL 3.5 05/18/2022 1428   VLDL 19 05/18/2022 1428   LDLCALC 74 05/18/2022 1428   Other Studies Reviewed Today:  No interval cardiac testing for review today.  Assessment and Plan:  1.  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  LVEF 60 to 65% at that time.  She reports no active angina at this time.  Continue aspirin and Plavix, although will likely stop Plavix around the time of her next visit.  She is otherwise on Imdur, Lopressor, Crestor, and Zetia.  2.  Essential hypertension.  Blood pressure is well-controlled today.  No change in current regimen.  3.  Mixed hyperlipidemia.  LDL 74 in December 2023.  Continue Crestor and Zetia.  Recheck FLP for next visit and make adjustments from there.  4.  OSA, not on CPAP for years.  Plan to get her referred for follow-up sleep medicine consultation.  Disposition:  Follow up  6 months.  Signed, Jonelle Sidle, M.D., F.A.C.C. [facility] at [facility]

## 2022-09-16 NOTE — Patient Instructions (Signed)
Medication Instructions:  Your physician recommends that you continue on your current medications as directed. Please refer to the Current Medication list given to you today.   Labwork: Fasting Lipids just BEFORE next visit in 6 months  Testing/Procedures: None today  Follow-Up: 6 months  Any Other Special Instructions Will Be Listed Below (If Applicable). You have been referred to Pulmonary, they will call you to schedule an appointment.   If you need a refill on your cardiac medications before your next appointment, please call your pharmacy.

## 2022-09-21 ENCOUNTER — Ambulatory Visit (INDEPENDENT_AMBULATORY_CARE_PROVIDER_SITE_OTHER): Payer: Medicare HMO | Admitting: General Surgery

## 2022-09-21 ENCOUNTER — Encounter: Payer: Self-pay | Admitting: General Surgery

## 2022-09-21 VITALS — BP 93/56 | HR 75 | Temp 98.0°F | Resp 18 | Ht 64.0 in | Wt 222.0 lb

## 2022-09-21 DIAGNOSIS — Z09 Encounter for follow-up examination after completed treatment for conditions other than malignant neoplasm: Secondary | ICD-10-CM

## 2022-09-21 NOTE — Progress Notes (Signed)
Subjective:     Katelyn Kaufman  Here for postoperative wound check, status post low anterior resection for colon cancer.  Patient has been doing well.  She has no complaints.  Her appetite has returned.  She is having normal bowel movements. Objective:    BP (!) 93/56   Pulse 75   Temp 98 F (36.7 C) (Oral)   Resp 18   Ht 5\' 4"  (1.626 m)   Wt 222 lb (100.7 kg)   SpO2 92%   BMI 38.11 kg/m   General:  alert, cooperative, and no distress  Abdomen is soft, incision healing well.  Staples removed. Patient already aware of the final pathology report.     Assessment:    Doing well postoperatively.    Plan:   Patient will be seeing Dr. [name] of oncology next week for treatment of her colon cancer.  Follow-up here as needed.  She should have a follow-up colonoscopy in 3 to 4 months by Dr. [name] as her colon was not completely evaluated prior to surgery.

## 2022-09-23 ENCOUNTER — Institutional Professional Consult (permissible substitution): Payer: Medicare HMO | Admitting: Pulmonary Disease

## 2022-10-06 ENCOUNTER — Inpatient Hospital Stay: Payer: Medicare HMO | Attending: Hematology

## 2022-10-06 DIAGNOSIS — Z79811 Long term (current) use of aromatase inhibitors: Secondary | ICD-10-CM | POA: Insufficient documentation

## 2022-10-06 DIAGNOSIS — F17210 Nicotine dependence, cigarettes, uncomplicated: Secondary | ICD-10-CM | POA: Diagnosis not present

## 2022-10-06 DIAGNOSIS — M8580 Other specified disorders of bone density and structure, unspecified site: Secondary | ICD-10-CM | POA: Diagnosis not present

## 2022-10-06 DIAGNOSIS — Z9012 Acquired absence of left breast and nipple: Secondary | ICD-10-CM | POA: Insufficient documentation

## 2022-10-06 DIAGNOSIS — Z170 Estrogen receptor positive status [ER+]: Secondary | ICD-10-CM | POA: Insufficient documentation

## 2022-10-06 DIAGNOSIS — C50412 Malignant neoplasm of upper-outer quadrant of left female breast: Secondary | ICD-10-CM | POA: Insufficient documentation

## 2022-10-06 LAB — CBC WITH DIFFERENTIAL/PLATELET
Abs Immature Granulocytes: 0.01 10*3/uL (ref 0.00–0.07)
Basophils Absolute: 0 10*3/uL (ref 0.0–0.1)
Basophils Relative: 0 %
Eosinophils Absolute: 0.1 10*3/uL (ref 0.0–0.5)
Eosinophils Relative: 1 %
HCT: 36.9 % (ref 36.0–46.0)
Hemoglobin: 12.4 g/dL (ref 12.0–15.0)
Immature Granulocytes: 0 %
Lymphocytes Relative: 50 %
Lymphs Abs: 3 10*3/uL (ref 0.7–4.0)
MCH: 30.8 pg (ref 26.0–34.0)
MCHC: 33.6 g/dL (ref 30.0–36.0)
MCV: 91.8 fL (ref 80.0–100.0)
Monocytes Absolute: 0.7 10*3/uL (ref 0.1–1.0)
Monocytes Relative: 11 %
Neutro Abs: 2.2 10*3/uL (ref 1.7–7.7)
Neutrophils Relative %: 38 %
Platelets: 224 10*3/uL (ref 150–400)
RBC: 4.02 MIL/uL (ref 3.87–5.11)
RDW: 15.2 % (ref 11.5–15.5)
WBC: 6 10*3/uL (ref 4.0–10.5)
nRBC: 0 % (ref 0.0–0.2)

## 2022-10-06 LAB — COMPREHENSIVE METABOLIC PANEL
ALT: 19 U/L (ref 0–44)
AST: 24 U/L (ref 15–41)
Albumin: 3.2 g/dL — ABNORMAL LOW (ref 3.5–5.0)
Alkaline Phosphatase: 59 U/L (ref 38–126)
Anion gap: 8 (ref 5–15)
BUN: 7 mg/dL — ABNORMAL LOW (ref 8–23)
CO2: 27 mmol/L (ref 22–32)
Calcium: 10.1 mg/dL (ref 8.9–10.3)
Chloride: 103 mmol/L (ref 98–111)
Creatinine, Ser: 1.06 mg/dL — ABNORMAL HIGH (ref 0.44–1.00)
GFR, Estimated: 56 mL/min — ABNORMAL LOW (ref >60)
Glucose, Bld: 185 mg/dL — ABNORMAL HIGH (ref 70–99)
Potassium: 3.1 mmol/L — ABNORMAL LOW (ref 3.5–5.1)
Sodium: 138 mmol/L (ref 135–145)
Total Bilirubin: 0.7 mg/dL (ref 0.3–1.2)
Total Protein: 6.5 g/dL (ref 6.5–8.1)

## 2022-10-08 LAB — VITAMIN D 25 HYDROXY (VIT D DEFICIENCY, FRACTURES)

## 2022-10-12 ENCOUNTER — Ambulatory Visit: Payer: Medicare HMO | Admitting: General Surgery

## 2022-10-13 ENCOUNTER — Encounter: Payer: Self-pay | Admitting: Hematology

## 2022-10-13 ENCOUNTER — Inpatient Hospital Stay (HOSPITAL_BASED_OUTPATIENT_CLINIC_OR_DEPARTMENT_OTHER): Payer: Medicare HMO | Admitting: Hematology

## 2022-10-13 VITALS — BP 130/68 | HR 65 | Temp 98.1°F | Resp 20 | Wt 230.0 lb

## 2022-10-13 DIAGNOSIS — C50412 Malignant neoplasm of upper-outer quadrant of left female breast: Secondary | ICD-10-CM | POA: Diagnosis not present

## 2022-10-13 DIAGNOSIS — Z170 Estrogen receptor positive status [ER+]: Secondary | ICD-10-CM | POA: Diagnosis not present

## 2022-10-13 MED ORDER — GABAPENTIN 300 MG PO CAPS: 300.0000 mg | ORAL_CAPSULE | Freq: Every day | ORAL | 3 refills | Status: DC

## 2022-10-13 NOTE — Patient Instructions (Addendum)
[facility] at [facility] Discharge Instructions   You were seen and examined today by Dr. Ellin Saba.  He reviewed the results of your lab work which are normal/stable. Your vitamin D is normal.   Continue anastrozole as prescribed.   Return as scheduled.    Thank you for choosing [facility] at [facility] to provide your oncology and hematology care.  To afford each patient quality time with our provider, please arrive at least 15 minutes before your scheduled appointment time.   If you have a lab appointment with the Cancer Center please come in thru the Main Entrance and check in at the main information desk.  You need to re-schedule your appointment should you arrive 10 or more minutes late.  We strive to give you quality time with our providers, and arriving late affects you and other patients whose appointments are after yours.  Also, if you no show three or more times for appointments you may be dismissed from the clinic at the providers discretion.     Again, thank you for choosing [facility].  Our hope is that these requests will decrease the amount of time that you wait before being seen by our physicians.       _____________________________________________________________  Should you have questions after your visit to [facility], please contact our office at [phone] and follow the prompts.  Our office hours are 8:00 a.m. and 4:30 p.m. Monday - Friday.  Please note that voicemails left after 4:00 p.m. may not be returned until the following business day.  We are closed weekends and major holidays.  You do have access to a nurse 24-7, just call the main number to the clinic [phone] and do not press any options, hold on the line and a nurse will answer the phone.    For prescription refill requests, have your pharmacy contact our office and allow 72 hours.    Due to Covid, you will need to wear a  mask upon entering the hospital. If you do not have a mask, a mask will be given to you at the Main Entrance upon arrival. For doctor visits, patients may have 1 support person age 60 or older with them. For treatment visits, patients can not have anyone with them due to social distancing guidelines and our immunocompromised population.

## 2022-10-13 NOTE — Progress Notes (Signed)
[facility] [address], [place] [address]    Clinic Day:  10/13/2022  Referring physician: Catalina Lunger, DO  Patient Care Team: Catalina Lunger, DO as PCP - General (Family Medicine) Jonelle Sidle, MD as PCP - Cardiology (Cardiology) Doreatha Massed, MD as Medical Oncologist (Medical Oncology) Therese Sarah, RN as Oncology Nurse Navigator (Medical Oncology)   ASSESSMENT & PLAN:   Assessment: 1. Stage II (T3N0G2) left breast cancer: - Abnormal screening mammogram on 10/14/2021, followed by diagnostic mammogram on 10/27/2021 - Left breast ultrasound (11/16/2021): Irregular mass containing calcifications in the left breast at 2 o'clock position 2 cm from nipple measuring 1.7 x 0.6 x 1 cm.  In addition there are several similar-appearing masses extending from this mass towards the nipple with a mass in the left breast 2:00 retroareolar measuring 0.4 x 0.4 x 0.4 cm.  Together these masses span a distance of approximately 2.7 cm.  No lymphadenopathy in the axilla. 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 - She will underwent CABG x3 on 11/16/2021 - 06/25/2022: Left simple mastectomy - Pathology: 5.5 cm infiltrating ductal carcinoma, grade 2, margins negative, PT3 PNX - 07/26/2022: Incision and debridement of left mastectomy wound - Anastrozole started on 08/03/2022   2. Social/family history: - She lives at home with roommate.  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  Current smoker, half pack per day for 40 years. - Brother had cancer.    Plan: 1. Stage II (T3N0G2) left breast cancer, ER/PR +, HER2-: - She is tolerating anastrozole reasonably well. - Reviewed labs from 10/06/2022: Normal LFTs.  CBC grossly normal.  Creatinine 1.06 and stable. - Continue anastrozole 1 mg  daily. - She reports hot flashes mostly at nighttime, at least to 2 per night, soaking her. - Recommend starting gabapentin 300 mg at bedtime. - RTC 4 months for follow-up.  We will schedule her right breast screening mammogram in the next few weeks.  2.  Osteopenia (DEXA 12/18/2021 T-score -1.2): - We reviewed vitamin D level is 30.1.  Continue calcium and vitamin D supplements.    No orders of the defined types were placed in this encounter.     I,Katie Daubenspeck,acting as a Neurosurgeon for Doreatha Massed, MD.,have documented all relevant documentation on the behalf of Doreatha Massed, MD,as directed by  Doreatha Massed, MD while in the presence of Doreatha Massed, MD.   I, Doreatha Massed MD, have reviewed the above documentation for accuracy and completeness, and I agree with the above.   Doreatha Massed, MD   5/22/20245:11 PM  CHIEF COMPLAINT:   Diagnosis: left breast cancer   Cancer Staging  Breast cancer of upper-outer quadrant of left female breast [facility]) Staging form: Breast, AJCC 8th Edition - Clinical stage from 12/10/2021: Stage IIA (cT3, cN0, cM0, G2, ER+, PR+, HER2-) - Signed by Doreatha Massed, MD on 08/03/2022    Prior Therapy: left mastectomy 06/25/22  Current Therapy:  anastrozole   HISTORY OF PRESENT ILLNESS:   Oncology History  Breast cancer of upper-outer quadrant of left female breast (HCC)  12/10/2021 Initial Diagnosis   Breast cancer of upper-outer quadrant of left female breast (HCC)   12/10/2021 Cancer Staging   Staging form: Breast, AJCC 8th Edition - Clinical stage from 12/10/2021: Stage IIA (cT3, cN0, cM0,  G2, ER+, PR+, HER2-) - Signed by Doreatha Massed, MD on 08/03/2022 Histopathologic type: Infiltrating duct carcinoma, NOS Stage prefix: Initial diagnosis Histologic grading system: 3 grade system      INTERVAL HISTORY:   Katelyn Kaufman is a 72 y.o. female presenting to clinic today for follow up of left breast  cancer. She was last seen by me on 08/03/22.  Today, she states that she is doing well overall. Her appetite level is at 75%. Her energy level is at 10%.  PAST MEDICAL HISTORY:   Past Medical History: 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   COPD (chronic obstructive pulmonary disease) (HCC)    Depression    Essential hypertension    Hyperlipidemia    Myocardial infarction (HCC)    [place] to the LAD   OSA (obstructive sleep apnea)    CPAP qHS   PONV (postoperative nausea and vomiting)    Type 2 diabetes mellitus (HCC)     Surgical History: Past Surgical History:  Procedure Laterality Date   BACK SURGERY     BREAST SURGERY     CATARACT EXTRACTION W/PHACO Right 10/24/2020   Procedure: CATARACT EXTRACTION PHACO AND INTRAOCULAR LENS PLACEMENT (IOC);  Surgeon: Fabio Pierce, MD;  Location: [facility];  Service: Ophthalmology;  Laterality: Right;  CDE: 10.87   CATARACT EXTRACTION W/PHACO Left 12/01/2020   Procedure: CATARACT EXTRACTION PHACO AND INTRAOCULAR LENS PLACEMENT LEFT EYE;  Surgeon: Fabio Pierce, MD;  Location: [facility];  Service: Ophthalmology;  Laterality: Left;  CDE  8.62   CORONARY ARTERY BYPASS GRAFT N/A 11/16/2021   Procedure: CORONARY ARTERY BYPASS GRAFTING (CABG) times three using the left internal mammary and right saphenous vein.;  Surgeon: Alleen Borne, MD;  Location: [facility] OR;  Service: Open Heart Surgery;  Laterality: N/A;   IABP INSERTION N/A 11/16/2021   Procedure: IABP Insertion;  Surgeon: Marykay Lex, MD;  Location[facility] INVASIVE CV LAB;  Service: Cardiovascular;  Laterality: N/A;   LEFT HEART CATH AND CORONARY ANGIOGRAPHY N/A 11/16/2021   Procedure: LEFT HEART CATH AND CORONARY ANGIOGRAPHY;  Surgeon: Marykay Lex, MD;   Location: [facility] INVASIVE CV LAB;  Service: Cardiovascular;  Laterality: N/A;   REPLACEMENT TOTAL KNEE Right    SIMPLE MASTECTOMY WITH AXILLARY SENTINEL NODE BIOPSY Left 06/25/2022   Procedure: SIMPLE MASTECTOMY;  Surgeon: Franky Macho, MD;  Location: [facility];  Service: General;  Laterality: Left;   TEE WITHOUT CARDIOVERSION N/A 11/16/2021   Procedure: TRANSESOPHAGEAL ECHOCARDIOGRAM (TEE);  Surgeon: Alleen Borne, MD;  Location: [facility] OR;  Service: Open Heart Surgery;  Laterality: N/A;   TUBAL LIGATION     WOUND EXPLORATION Left 07/26/2022   Procedure: EVACUATION OF HEMATOMA BREAST, DEBRIDEMENT OF LEFT BREAST WOUND;  Surgeon: Franky Macho, MD;  Location: [facility];  Service: General;  Laterality: Left;    Social History: Social History   Socioeconomic History   Marital status: Widowed    Spouse name: Not on file   Number of children: Not on file   Years of education: Not on file   Highest education level: Not on file  Occupational History   Not on file  Tobacco Use   Smoking status: Every Day    Packs/day: 0.50    Years: 40.00  Additional pack years: 0.00    Total pack years: 20.00    Types: Cigarettes   Smokeless tobacco: Never  Vaping Use   Vaping Use: Never used  Substance and Sexual Activity   Alcohol use: Never   Drug use: Never   Sexual activity: Not Currently  Other Topics Concern   Not on file  Social History Narrative   Not on file   Social Determinants of Health   Financial Resource Strain: Not on file  Food Insecurity: Food Insecurity Present (07/25/2022)   Hunger Vital Sign    Worried About Running Out of Food in the Last Year: Often true    Ran Out of Food in the Last Year: Often true  Transportation Needs: No Transportation Needs (07/25/2022)   PRAPARE - Administrator, Civil Service (Medical): No    Lack of Transportation (Non-Medical): No  Physical Activity: Not on file  Stress: Not on file  Social Connections: Not on file  Intimate Partner  Violence: Not At Risk (07/25/2022)   Humiliation, Afraid, Rape, and Kick questionnaire    Fear of Current or Ex-Partner: No    Emotionally Abused: No    Physically Abused: No    Sexually Abused: No    Family History: Family History  Problem Relation Age of Onset   COPD Mother    Alcohol abuse Father    Hypertension Sister    Heart attack Brother     Current Medications:  Current Outpatient Medications:    acetaminophen (TYLENOL) 325 MG tablet, Take 2 tablets (650 mg total) by mouth every 6 (six) hours as needed for mild pain (or Fever >/= 101)., Disp: 100 tablet, Rfl: 2   anastrozole (ARIMIDEX) 1 MG tablet, Take 1 tablet (1 mg total) by mouth daily., Disp: 90 tablet, Rfl: 1   Ascorbic Acid (VITAMIN C) 1000 MG tablet, Take 1,000 mg by mouth daily., Disp: , Rfl:    aspirin EC 81 MG tablet, Take 1 tablet (81 mg total) by mouth daily. Swallow whole., Disp: 30 tablet, Rfl: 12   baclofen (LIORESAL) 10 MG tablet, Take 10 mg by mouth 2 (two) times daily as needed., Disp: , Rfl:    cholecalciferol (VITAMIN D3) 25 MCG (1000 UNIT) tablet, Take 1,000 Units by mouth daily., Disp: , Rfl:    clopidogrel (PLAVIX) 75 MG tablet, TAKE 1 TABLET BY MOUTH EVERY DAY, Disp: 90 tablet, Rfl: 1   cyclobenzaprine (FLEXERIL) 10 MG tablet, Take 10 mg by mouth 2 (two) times daily as needed for muscle spasms., Disp: , Rfl:    DULoxetine (CYMBALTA) 60 MG capsule, Take 60 mg by mouth daily., Disp: , Rfl:    ezetimibe (ZETIA) 10 MG tablet, Take 10 mg by mouth daily., Disp: , Rfl:    furosemide (LASIX) 40 MG tablet, TAKE 1 AND 1/2 TABLETS(60 MG) BY MOUTH DAILY (Patient taking differently: Take 40 mg by mouth daily. Take 1 and 1/2 tablet by mouth daily), Disp: 135 tablet, Rfl: 2   gabapentin (NEURONTIN) 300 MG capsule, Take 1 capsule (300 mg total) by mouth at bedtime., Disp: 30 capsule, Rfl: 3   glimepiride (AMARYL) 4 MG tablet, Take 4 mg by mouth daily with breakfast., Disp: , Rfl:    hydrOXYzine (ATARAX) 25 MG tablet,  Take 25 mg by mouth daily., Disp: , Rfl:    isosorbide mononitrate (IMDUR) 30 MG 24 hr tablet, Take 1 tablet (30 mg total) by mouth daily., Disp: 30 tablet, Rfl: 2   LANTUS SOLOSTAR 100 UNIT/ML  Solostar Pen, Inject 25 Units into the skin at bedtime., Disp: 15 mL, Rfl: 11   lubiprostone (AMITIZA) 24 MCG capsule, Take 24 mcg by mouth 2 (two) times daily., Disp: , Rfl:    metFORMIN (GLUCOPHAGE) 500 MG tablet, Take 500-1,000 mg by mouth See admin instructions. Take 2 tablets (1000 mg) in the morning and Take 1 tablets (500 mg) in the evening, Disp: , Rfl:    metoprolol tartrate (LOPRESSOR) 25 MG tablet, Take 1 tablet (25 mg total) by mouth 2 (two) times daily., Disp: 60 tablet, Rfl: 1   Multiple Vitamin (MULTIVITAMIN) capsule, Take 1 capsule by mouth daily., Disp: , Rfl:    nicotine (NICODERM CQ - DOSED IN MG/24 HOURS) 21 mg/24hr patch, Place 1 patch (21 mg total) onto the skin daily., Disp: 30 patch, Rfl: 0   ondansetron (ZOFRAN-ODT) 4 MG disintegrating tablet, Take 4 mg by mouth every 8 (eight) hours as needed for vomiting or nausea. For 7 days supply, Disp: , Rfl:    oxyCODONE-acetaminophen (PERCOCET) 10-325 MG tablet, Take 1 tablet by mouth every 4 (four) hours as needed for pain., Disp: 15 tablet, Rfl: 0   rosuvastatin (CRESTOR) 10 MG tablet, Take 10 mg by mouth daily., Disp: , Rfl:    vitamin B-12 (CYANOCOBALAMIN) 1000 MCG tablet, Take 1,000 mcg by mouth daily., Disp: , Rfl:    nitroGLYCERIN (NITROSTAT) 0.4 MG SL tablet, Place 1 tablet (0.4 mg total) under the tongue every 5 (five) minutes x 3 doses as needed for chest pain (if no relief after 3rd dose, proceed to ED or call 911)., Disp: 75 tablet, Rfl: 1   Allergies: Allergies  Allergen Reactions   Latex Rash    REVIEW OF SYSTEMS:   Review of Systems  Constitutional:  Negative for chills, fatigue and fever.  HENT:   Negative for lump/mass, mouth sores, nosebleeds, sore throat and trouble swallowing.   Eyes:  Negative for eye problems.   Respiratory:  Negative for cough and shortness of breath.   Cardiovascular:  Negative for chest pain, leg swelling and palpitations.  Gastrointestinal:  Negative for abdominal pain, constipation, diarrhea, nausea and vomiting.  Genitourinary:  Negative for bladder incontinence, difficulty urinating, dysuria, frequency, hematuria and nocturia.   Musculoskeletal:  Positive for back pain. Negative for arthralgias, flank pain, myalgias and neck pain.  Skin:  Negative for itching and rash.  Neurological:  Positive for dizziness. Negative for headaches and numbness.  Hematological:  Does not bruise/bleed easily.  Psychiatric/Behavioral:  Negative for depression, sleep disturbance and suicidal ideas. The patient is not nervous/anxious.   All other systems reviewed and are negative.    VITALS:   Blood pressure 130/68, pulse 65, temperature 98.1 F (36.7 C), temperature source Oral, resp. rate 20, weight 230 lb (104.3 kg), SpO2 100 %.  Wt Readings from Last 3 Encounters:  10/13/22 230 lb (104.3 kg)  09/21/22 222 lb (100.7 kg)  09/16/22 218 lb (98.9 kg)    Body mass index is 39.48 kg/m.  Performance status (ECOG): 1 - Symptomatic but completely ambulatory  PHYSICAL EXAM:   Physical Exam Vitals and nursing note reviewed. Exam conducted with a chaperone present.  Constitutional:      Appearance: Normal appearance.  Cardiovascular:     Rate and Rhythm: Normal rate and regular rhythm.     Pulses: Normal pulses.     Heart sounds: Normal heart sounds.  Pulmonary:     Effort: Pulmonary effort is normal.     Breath sounds: Normal breath sounds.  Abdominal:     Palpations: Abdomen is soft. There is no hepatomegaly, splenomegaly or mass.     Tenderness: There is no abdominal tenderness.  Musculoskeletal:     Right lower leg: No edema.     Left lower leg: No edema.  Lymphadenopathy:     Cervical: No cervical adenopathy.     Right cervical: No superficial, deep or posterior cervical  adenopathy.    Left cervical: No superficial, deep or posterior cervical adenopathy.     Upper Body:     Right upper body: No supraclavicular or axillary adenopathy.     Left upper body: No supraclavicular or axillary adenopathy.  Neurological:     General: No focal deficit present.     Mental Status: She is alert and oriented to person, place, and time.  Psychiatric:        Mood and Affect: Mood normal.        Behavior: Behavior normal.     LABS:      Latest Ref Rng & Units 10/06/2022    1:05 PM 07/30/2022    4:40 AM 07/28/2022    5:51 AM  CBC  WBC 4.0 - 10.5 K/uL 6.0  6.5  6.7   Hemoglobin 12.0 - 15.0 g/dL 40.9  81.1  91.4   Hematocrit 36.0 - 46.0 % 36.9  30.7  31.3   Platelets 150 - 400 K/uL 224  249  197       Latest Ref Rng & Units 10/06/2022    1:05 PM 07/30/2022    4:40 AM 07/28/2022    5:51 AM  CMP  Glucose 70 - 99 mg/dL 782  956  213   BUN 8 - 23 mg/dL 7  10  12    Creatinine 0.44 - 1.00 mg/dL 0.86  5.78  4.69   Sodium 135 - 145 mmol/L 138  136  132   Potassium 3.5 - 5.1 mmol/L 3.1  4.0  4.0   Chloride 98 - 111 mmol/L 103  104  100   CO2 22 - 32 mmol/L 27  23  24    Calcium 8.9 - 10.3 mg/dL 62.9  52.8  9.8   Total Protein 6.5 - 8.1 g/dL 6.5     Total Bilirubin 0.3 - 1.2 mg/dL 0.7     Alkaline Phos 38 - 126 U/L 59     AST 15 - 41 U/L 24     ALT 0 - 44 U/L 19        No results found for: "CEA1", "CEA" / No results found for: "CEA1", "CEA" No results found for: "PSA1" No results found for: "UXL244" No results found for: "CAN125"  No results found for: "TOTALPROTELP", "ALBUMINELP", "A1GS", "A2GS", "BETS", "BETA2SER", "GAMS", "MSPIKE", "SPEI" No results found for: "TIBC", "FERRITIN", "IRONPCTSAT" No results found for: "LDH"   STUDIES:   No results found.

## 2022-10-14 ENCOUNTER — Other Ambulatory Visit: Payer: Self-pay | Admitting: *Deleted

## 2022-10-14 DIAGNOSIS — Z170 Estrogen receptor positive status [ER+]: Secondary | ICD-10-CM

## 2022-10-19 ENCOUNTER — Ambulatory Visit: Payer: Medicare HMO | Admitting: General Surgery

## 2022-10-25 ENCOUNTER — Ambulatory Visit (HOSPITAL_COMMUNITY): Payer: Medicare HMO

## 2022-11-04 ENCOUNTER — Ambulatory Visit (HOSPITAL_COMMUNITY)
Admission: RE | Admit: 2022-11-04 | Discharge: 2022-11-04 | Disposition: A | Discharge status: Home / Self Care | Payer: Medicare HMO | Source: Ambulatory Visit | Attending: Hematology | Admitting: Hematology

## 2022-11-04 DIAGNOSIS — Z1231 Encounter for screening mammogram for malignant neoplasm of breast: Secondary | ICD-10-CM | POA: Insufficient documentation

## 2022-11-04 DIAGNOSIS — Z170 Estrogen receptor positive status [ER+]: Secondary | ICD-10-CM | POA: Diagnosis not present

## 2022-11-04 DIAGNOSIS — C50412 Malignant neoplasm of upper-outer quadrant of left female breast: Secondary | ICD-10-CM | POA: Diagnosis not present

## 2022-11-18 IMAGING — US US BREAST BX W LOC DEV EA ADD LESION IMG BX SPEC US GUIDE
1 series · 15 of 25 positions shown · non-contrast
Comparison: Prior studies.
COMPARISON: Prior studies.

Addendum:
CLINICAL DATA: Patient presents for ultrasound-guided biopsies of
the LEFT breast.

EXAM:
ULTRASOUND GUIDED LEFT BREAST CORE NEEDLE BIOPSY

[Series 1: us left breast bx w loc dev ea add lesion img bx s · 15 of 50 slices shown]
[im 1/50]
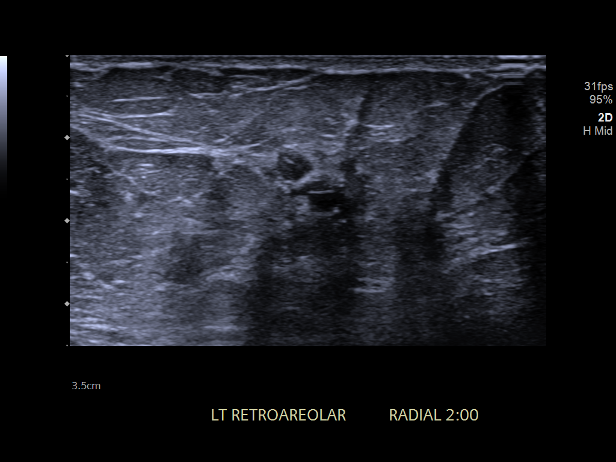
[im 5/50]
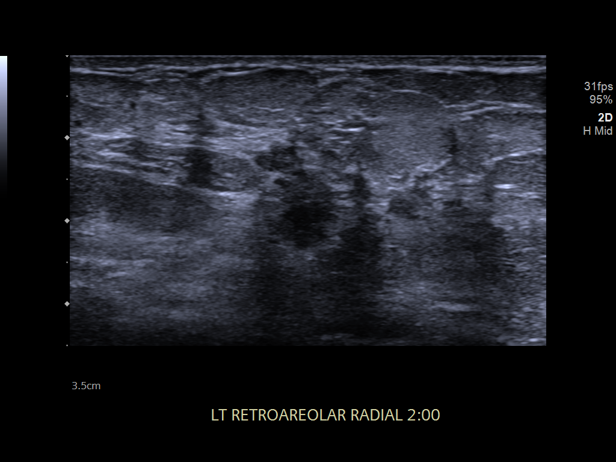
[im 9/50]
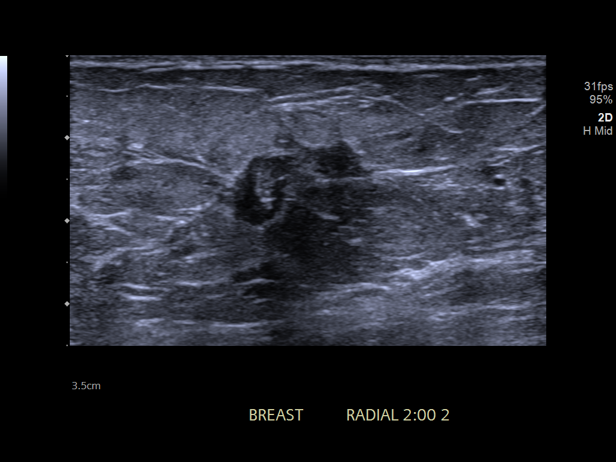
[im 11/50]
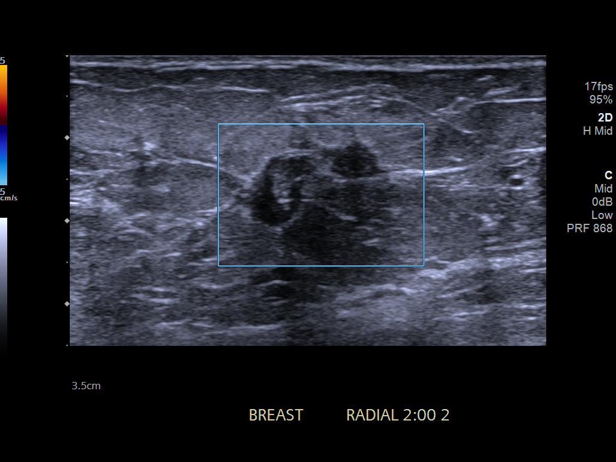
[im 15/50]
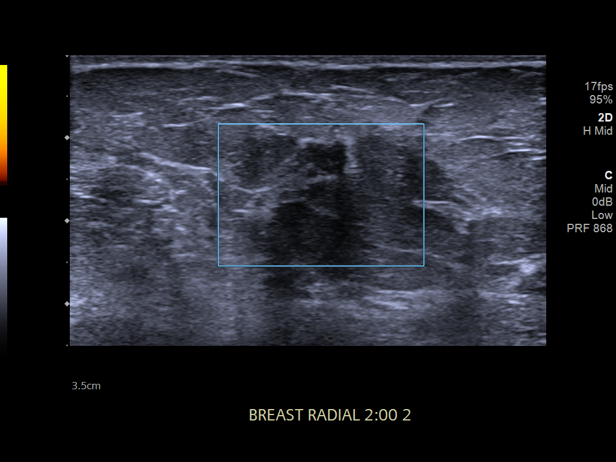
[im 19/50]
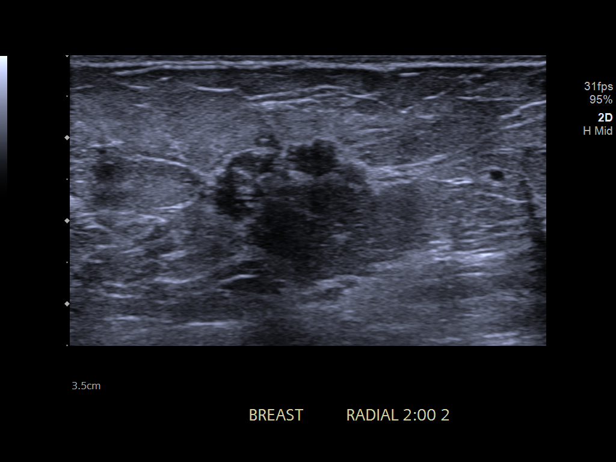
[im 21/50]
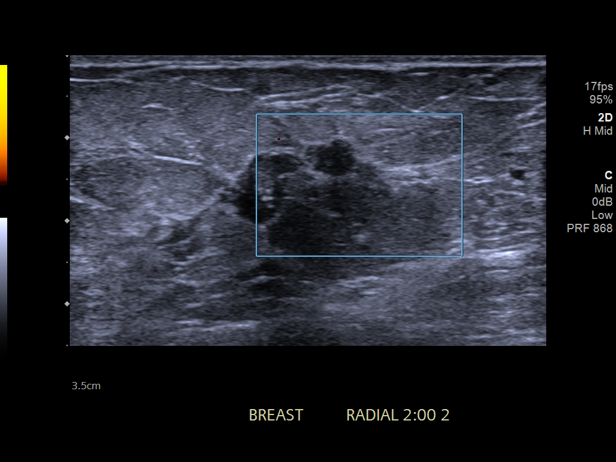
[im 25/50]
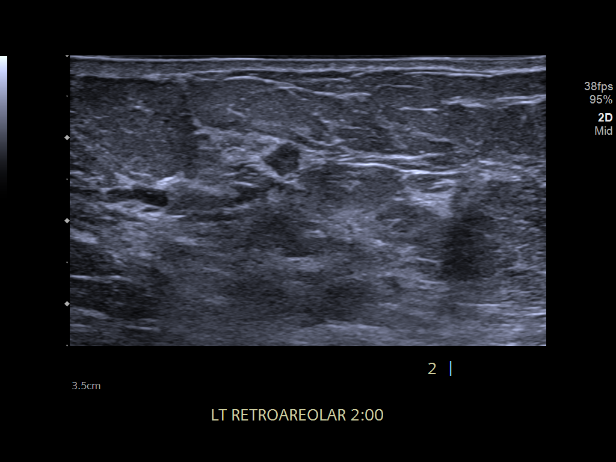
[im 29/50]
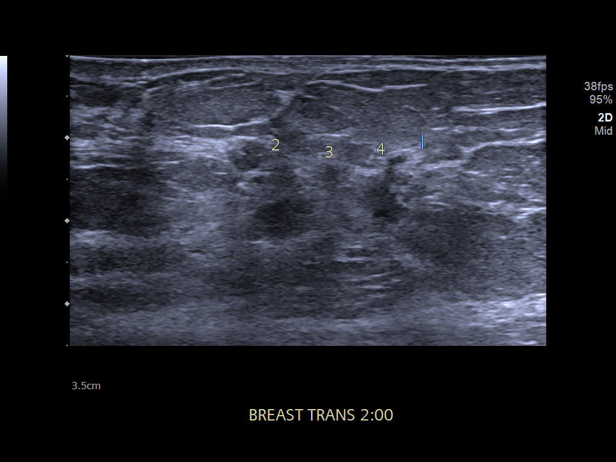
[im 31/50]
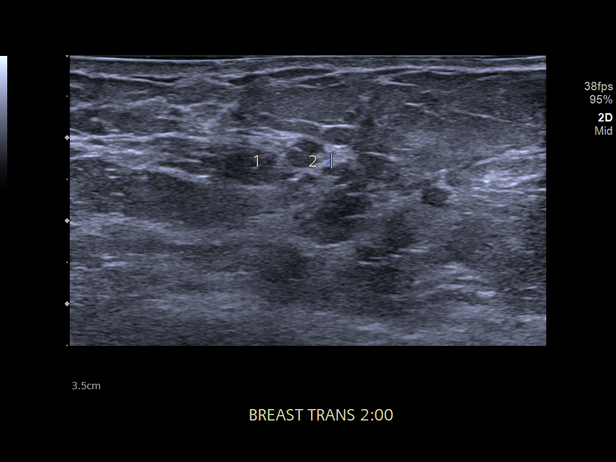
[im 35/50]
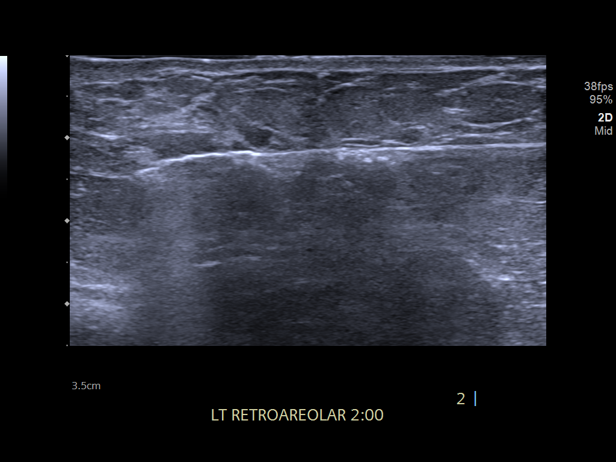
[im 39/50]
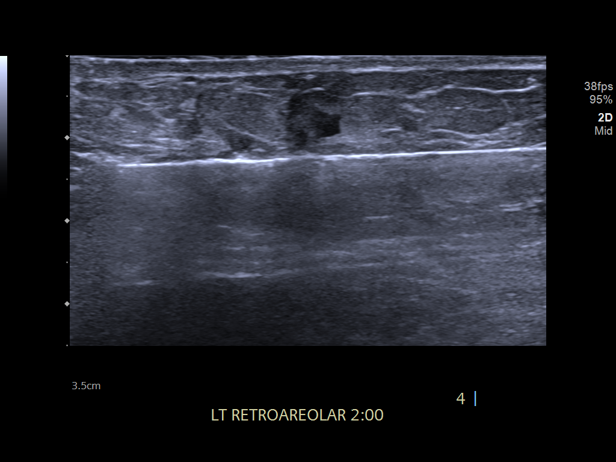
[im 41/50]
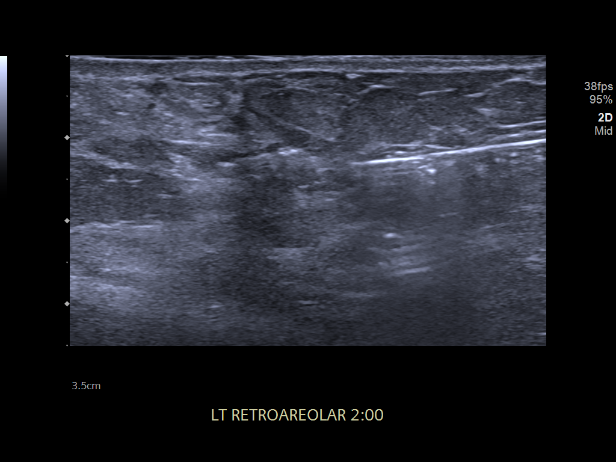
[im 45/50]
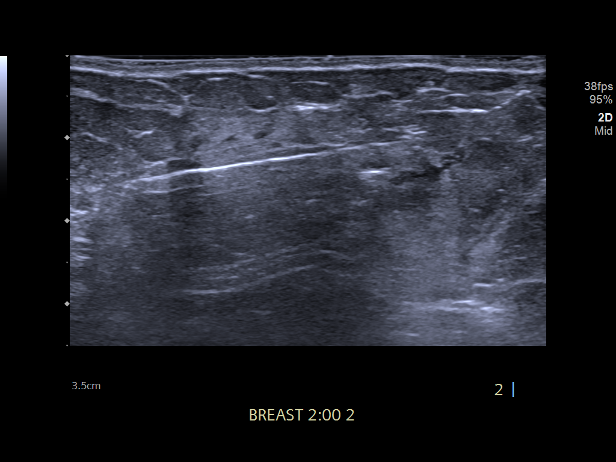
[im 50/50]
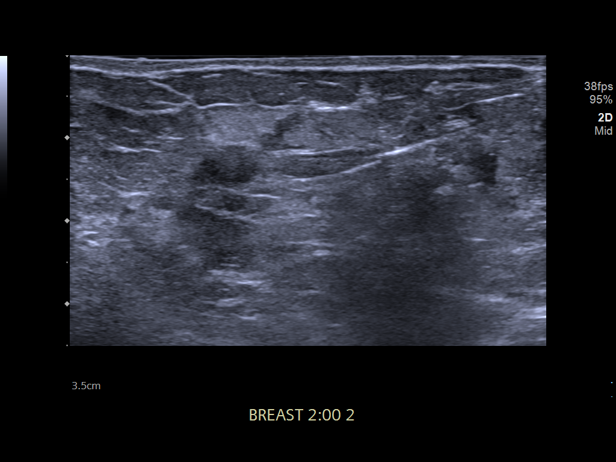

[15 of 25 positions shown; findings below may reference images not displayed]



Prior to biopsy, targeted ultrasound is performed in the UPPER-OUTER
QUADRANT of the LEFT breast. In this region, a total of 5 small
irregular masses are identified in a radial distribution. Lesions
are estimated to span at least 3.6 centimeters.

The first is in the retroareolar region measuring 0.7 x 0 4 by
centimeters.

The second is in the retroareolar region, 0 4 x 0.4 by
centimeters.

The third is 1 centimeter from the nipple, 0.5 x 0.6 x 0 4
centimeters.

The fourth is 2 centimeters from nipple, 1.2 x 0.8 by
centimeters.

The 5th is 2 centimeters from the nipple, 0.9 x 0 4 by
centimeters.

Site 1: LEFT breast 2 o'clock retroareolar. Lesion quadrant: UPPER
OUTER QUADRANT, ribbon clip

Using sterile technique and lidocaine and lidocaine with epinephrine
as local anesthetic, under direct ultrasound visualization, a 12
gauge Md. Golam device was used to perform biopsy of mass in the
2 o'clock retroareolar region of the LEFT breast using a superior to
inferior approach. At the conclusion of the procedure ribbon tissue
marker clip was deployed into the biopsy cavity.

Site 2: LEFT breast 2 o'clock 2 centimeters from the nipple. Lesion
quadrant: UPPER-OUTER QUADRANT LEFT breast, wing clip

Using sterile technique and lidocaine and lidocaine with epinephrine
as local anesthetic, under direct ultrasound visualization, a 12
gauge Md. Golam device was used to perform biopsy of mass in the
2 o'clock location the LEFT breast 2 centimeters from the nipple
using a superior to inferior approach. At the conclusion of the
procedure wing tissue marker clip was deployed into the biopsy
cavity.

Follow-up 2-view mammogram was performed and dictated separately.
IMPRESSION: Ultrasound guided biopsy of LEFT breast masses. No apparent
complications.

ADDENDUM:
Pathology revealed BREAST, LEFT [DATE], RETROAREOLAR: GRADE II
INVASIVE MAMMARY CARCINOMA WITH DUCTAL AND LOBULAR FEATURES (ribbon
clip). This was found to be concordant by Dr. Kamenko Ramusha.

Pathology revealed BREAST, LEFT [DATE], 2 cmfn: GRADE II INVASIVE
DUCTAL CARCINOMA, DUCTAL CARCINOMA IN SITU (DCIS) solid type
(nuclear grade 2) - Calcifications present (wing shaped clip). This
was found to be concordant by Dr. Kamenko Ramusha.

Given the presence of multiple masses in the 2 o'clock location of
the LEFT breast (up to 5 lesions seen at the time of biopsy), MRI is
recommended to determine extent of disease.

Pathology results were discussed with the patient by telephone. The
patient reported doing well after the biopsies with tenderness at
the sites. Post biopsy instructions and care were reviewed and
questions were answered. The patient was encouraged to [facility] [[facility]OSP[facility]] for any additional concerns.

On November 04, 2021, reports and recommendations sent to Hezzi Saad
RT(R) (M) (CT) of [HOSPITAL] Mammography who will assist in surgical
referral.

Pathology results reported by Balraj Mancilla RN on 11/05/2021.



Prior to biopsy, targeted ultrasound is performed in the UPPER-OUTER
QUADRANT of the LEFT breast. In this region, a total of 5 small
irregular masses are identified in a radial distribution. Lesions
are estimated to span at least 3.6 centimeters.

The first is in the retroareolar region measuring 0.7 x 0 4 by
centimeters.

The second is in the retroareolar region, 0 4 x 0.4 by
centimeters.

The third is 1 centimeter from the nipple, 0.5 x 0.6 x 0 4
centimeters.

The fourth is 2 centimeters from nipple, 1.2 x 0.8 by
centimeters.

The 5th is 2 centimeters from the nipple, 0.9 x 0 4 by
centimeters.

Site 1: LEFT breast 2 o'clock retroareolar. Lesion quadrant: UPPER
OUTER QUADRANT, ribbon clip

Using sterile technique and lidocaine and lidocaine with epinephrine
as local anesthetic, under direct ultrasound visualization, a 12
gauge Md. Golam device was used to perform biopsy of mass in the
2 o'clock retroareolar region of the LEFT breast using a superior to
inferior approach. At the conclusion of the procedure ribbon tissue
marker clip was deployed into the biopsy cavity.

Site 2: LEFT breast 2 o'clock 2 centimeters from the nipple. Lesion
quadrant: UPPER-OUTER QUADRANT LEFT breast, wing clip

Using sterile technique and lidocaine and lidocaine with epinephrine
as local anesthetic, under direct ultrasound visualization, a 12
gauge Md. Golam device was used to perform biopsy of mass in the
2 o'clock location the LEFT breast 2 centimeters from the nipple
using a superior to inferior approach. At the conclusion of the
procedure wing tissue marker clip was deployed into the biopsy
cavity.

Follow-up 2-view mammogram was performed and dictated separately.
IMPRESSION: Ultrasound guided biopsy of LEFT breast masses. No apparent
complications.

## 2022-11-18 IMAGING — MG MM BREAST LOCALIZATION CLIP
4 series · 4 of 12 positions shown · non-contrast
Comparison: Previous exam(s).

CLINICAL DATA: Status post ultrasound-guided core biopsy of 2 sites
in the LEFT breast.

EXAM:
3D DIAGNOSTIC LEFT MAMMOGRAM POST ULTRASOUND BIOPSY x2

[L ML synth-2D]
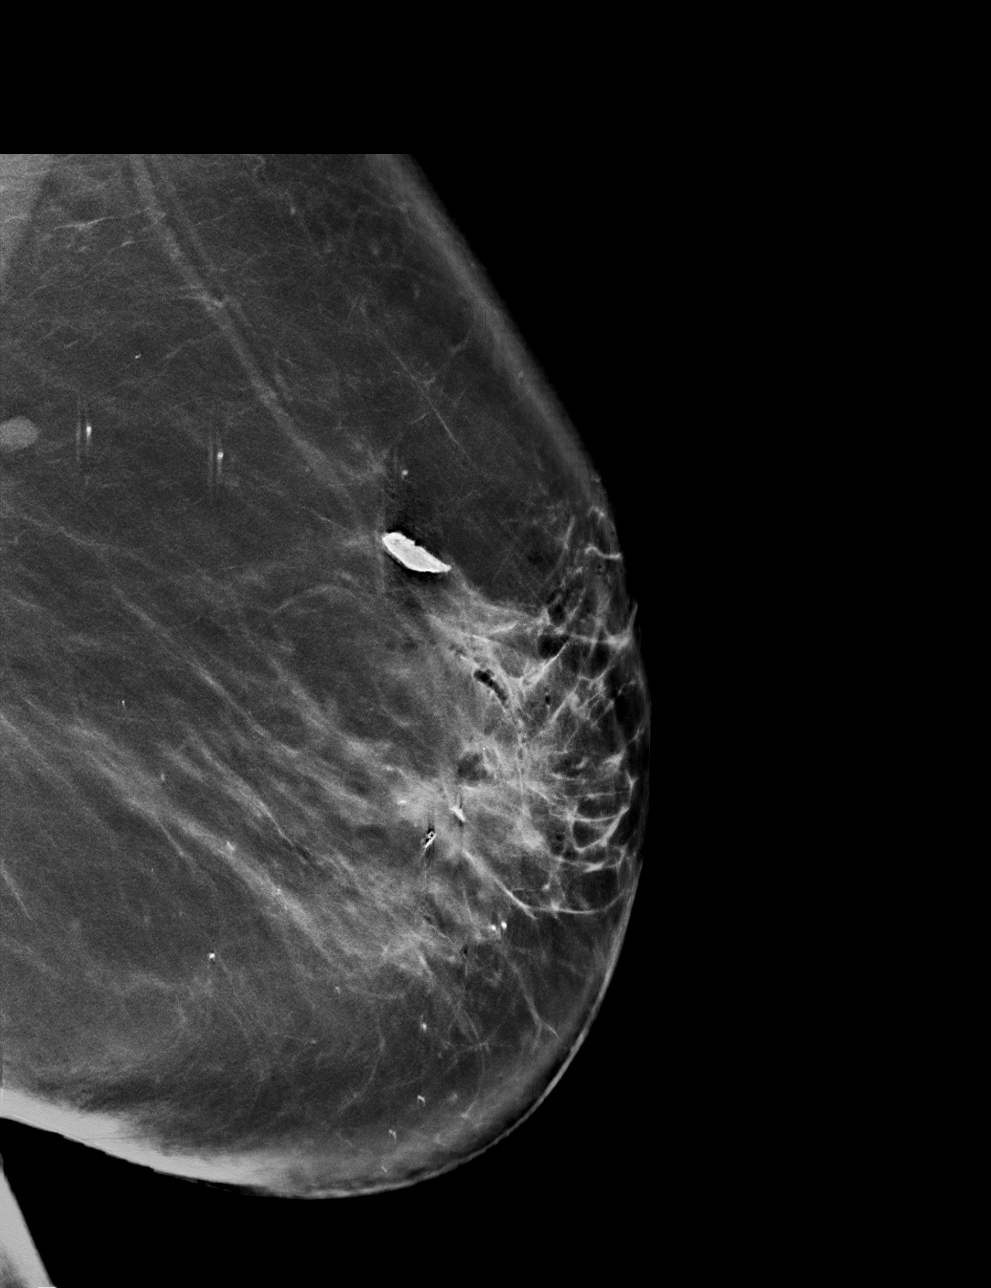

[L CC synth-2D]
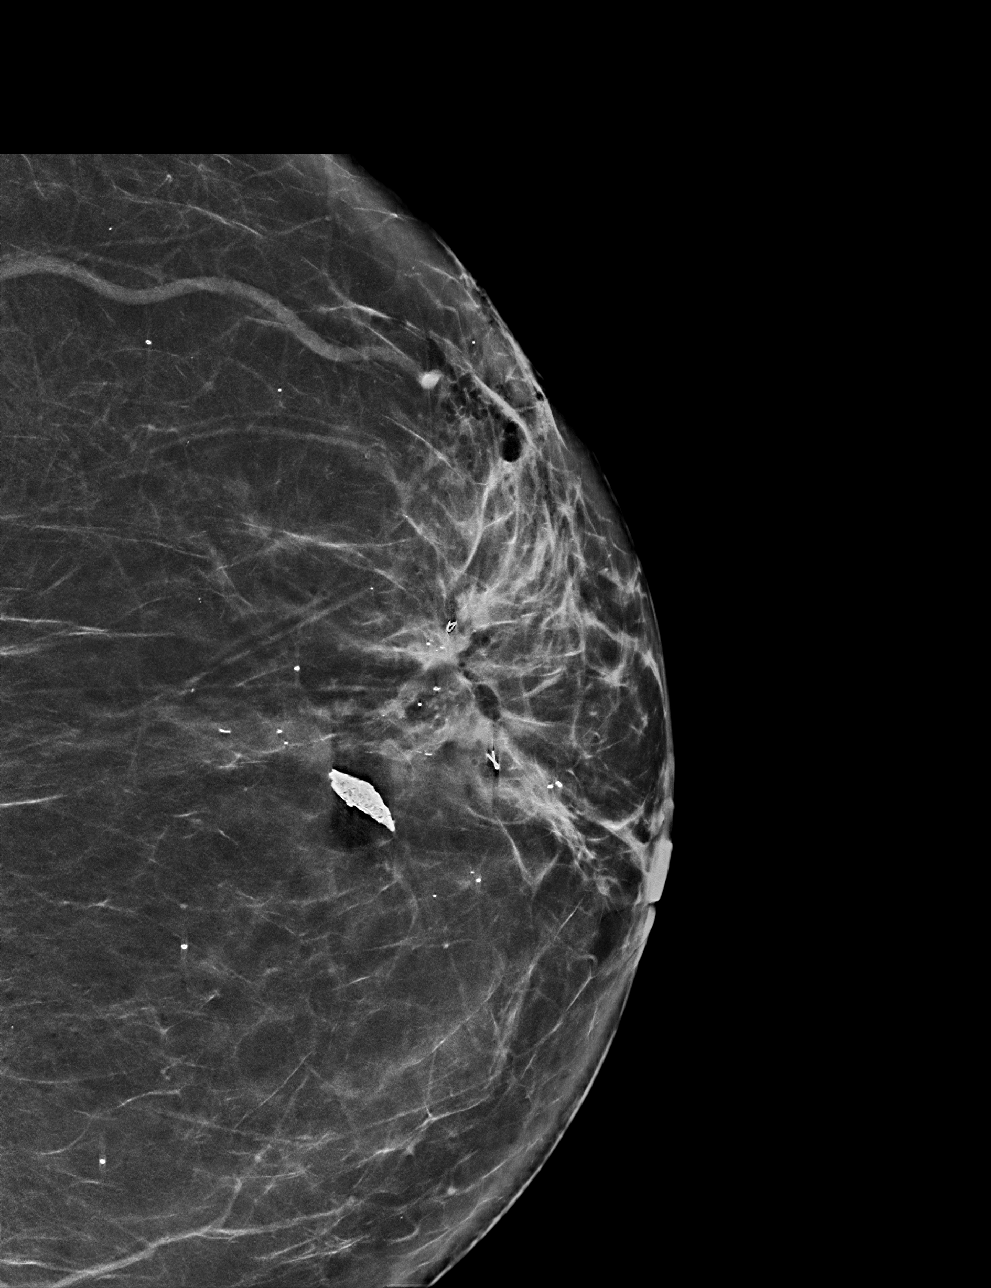

[L CC tomo · tomo slice 39/77.0]
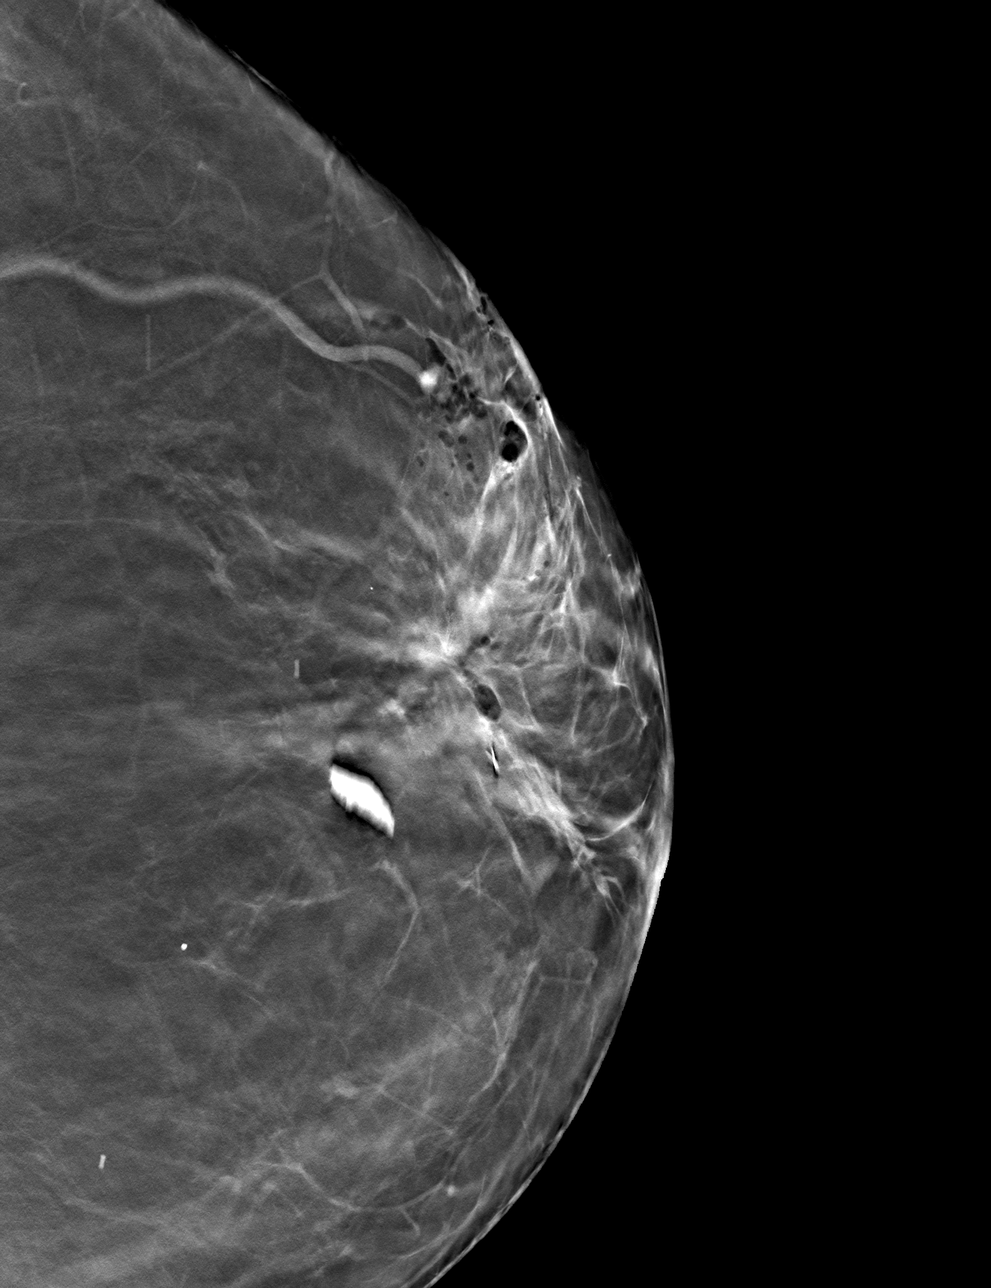

[L ML tomo · tomo slice 52/103.0]
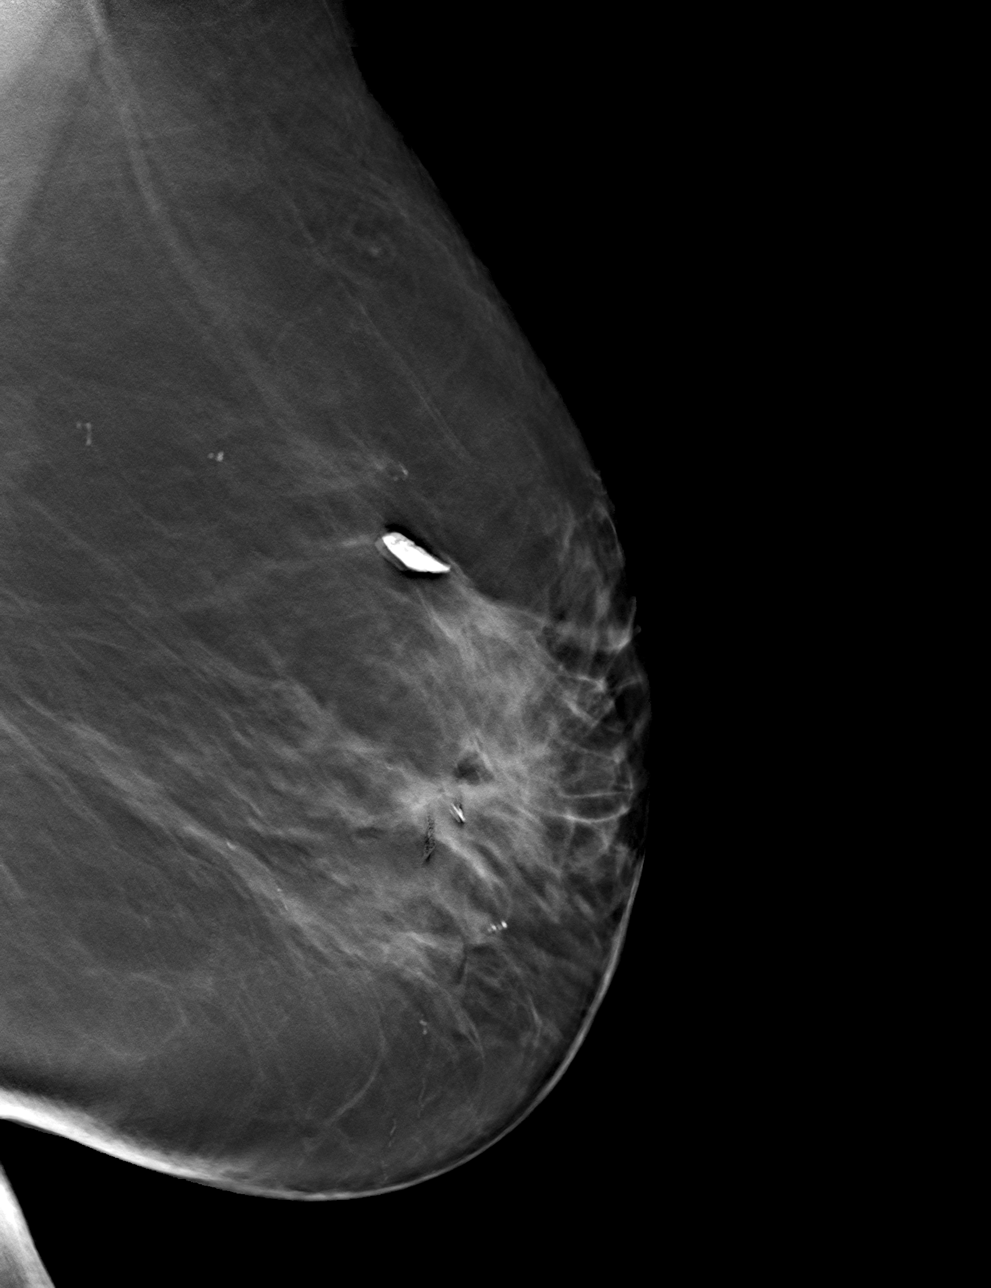

[4 of 12 positions shown; findings below may reference images not displayed]

FINDINGS: 3D Mammographic images were obtained following ultrasound guided
biopsy of mass in the 2 o'clock location of the LEFT breast and
placement of a ribbon shaped clip. The biopsy marking clip is in
expected position at the site of biopsy.

Following biopsy of mass in the 2 o'clock location 2 centimeters
from nipple, a wing shaped clip was placed and is identified in
expected location.

Clips are 2.5 centimeters apart on craniocaudal projection.
IMPRESSION: Tissue marker clips are in the expected locations after biopsy.

Final Assessment: Post Procedure Mammograms for Marker Placement

## 2022-11-18 IMAGING — US US BREAST BX W LOC DEV 1ST LESION IMG BX SPEC US GUIDE*L*
1 series · 15 of 25 positions shown · non-contrast
Comparison: Prior studies.
COMPARISON: Prior studies.

Addendum:
CLINICAL DATA: Patient presents for ultrasound-guided biopsies of
the LEFT breast.

EXAM:
ULTRASOUND GUIDED LEFT BREAST CORE NEEDLE BIOPSY

[Series 1: us left breast bx w loc dev ea add lesion img bx s · 15 of 50 slices shown]
[im 1/50]
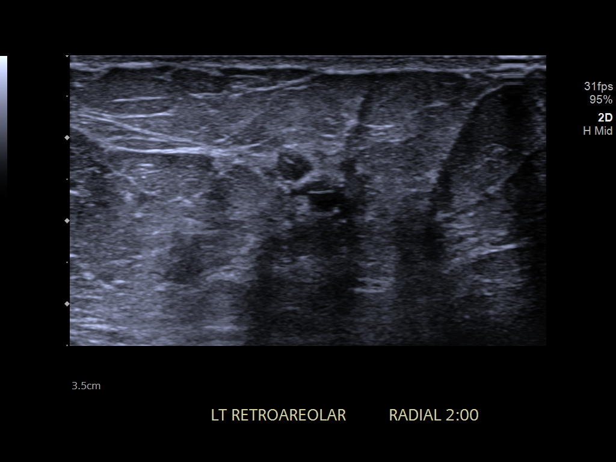
[im 5/50]
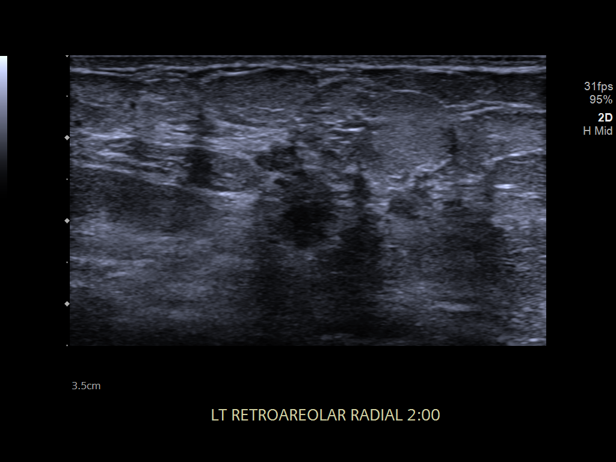
[im 9/50]
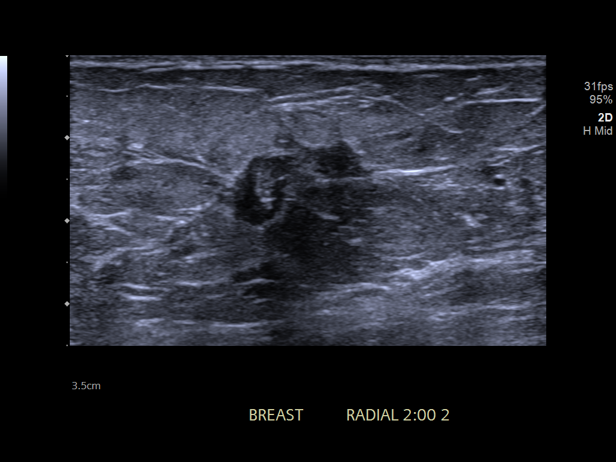
[im 11/50]
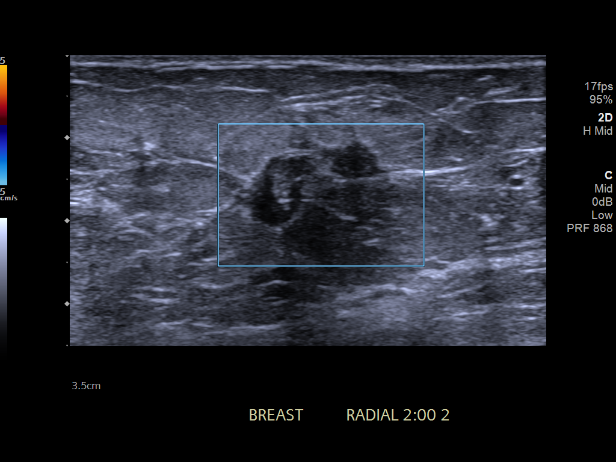
[im 15/50]
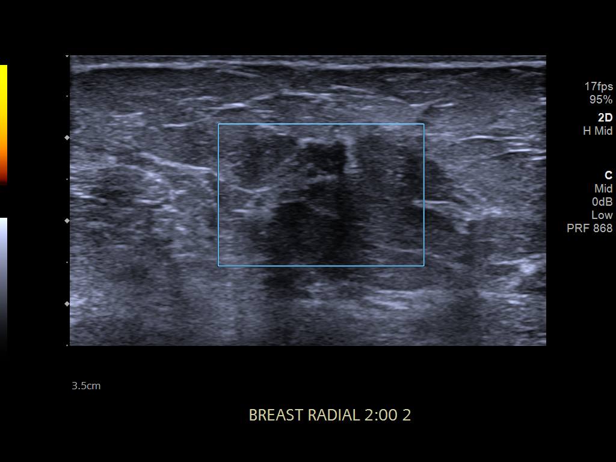
[im 19/50]
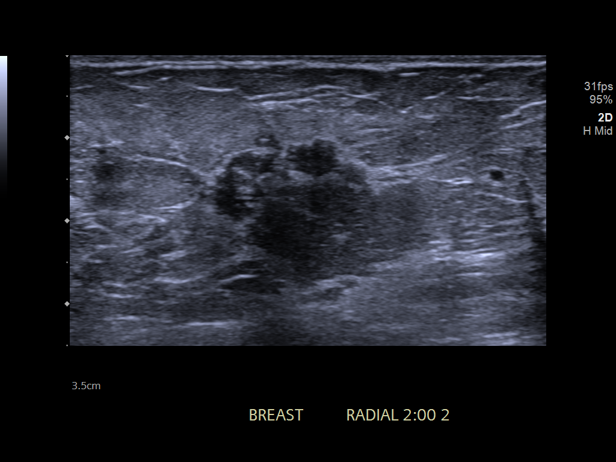
[im 21/50]
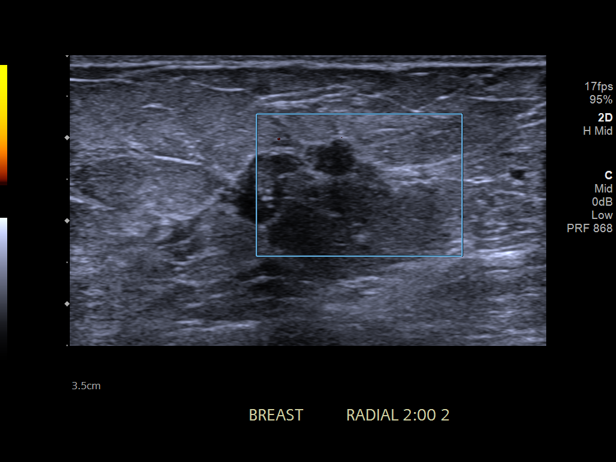
[im 25/50]
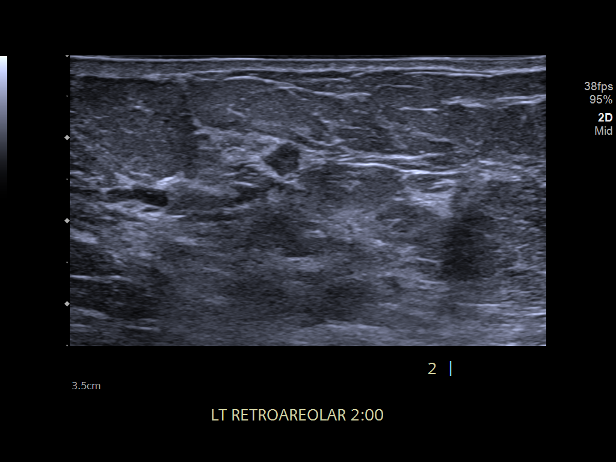
[im 29/50]
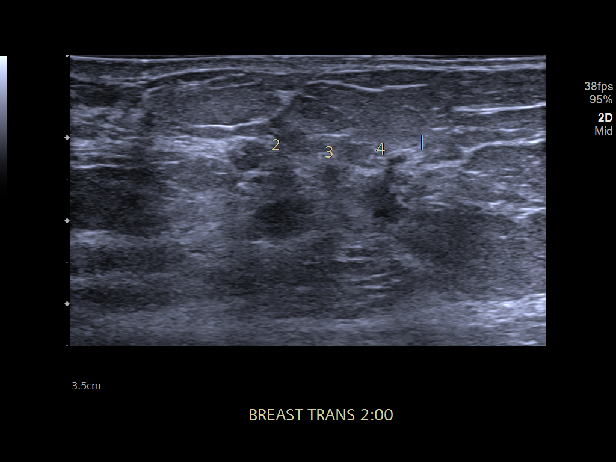
[im 31/50]
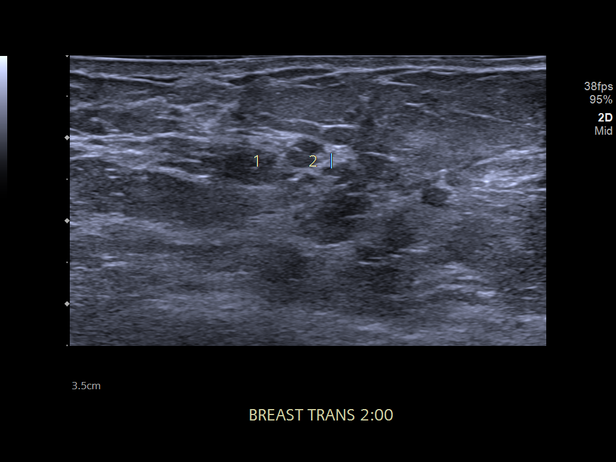
[im 35/50]
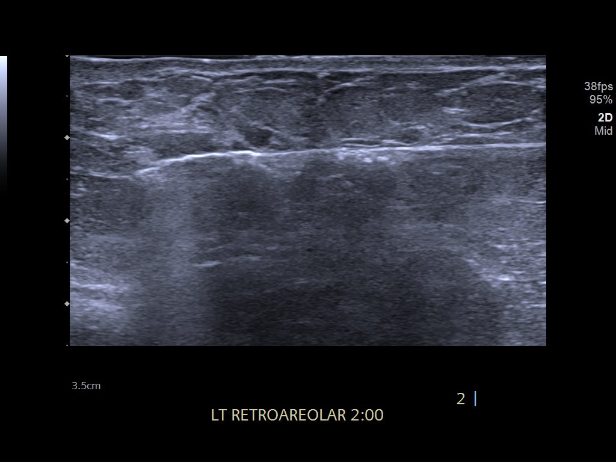
[im 39/50]
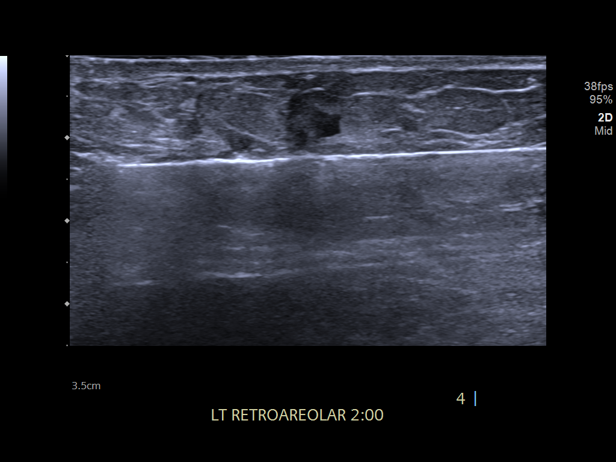
[im 41/50]
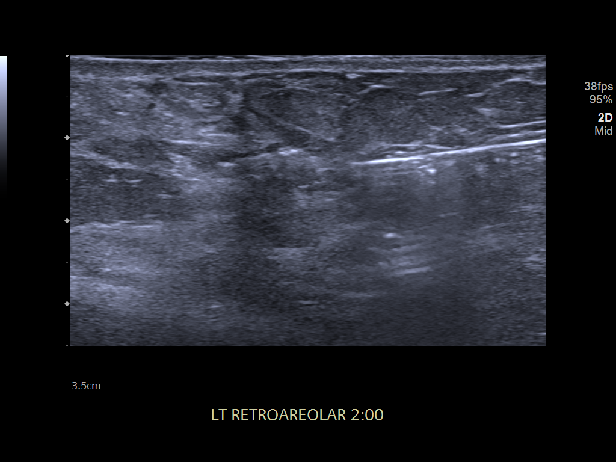
[im 45/50]
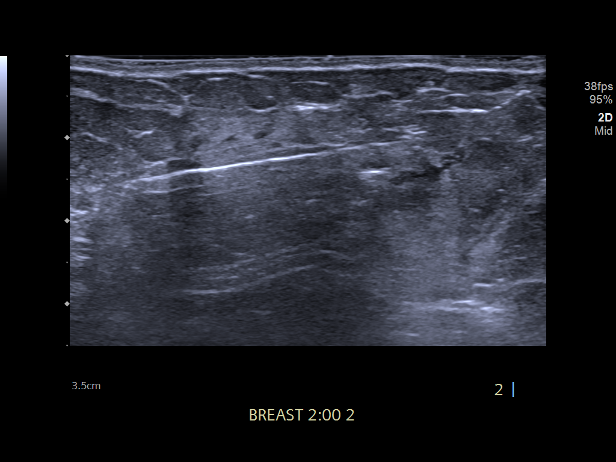
[im 50/50]
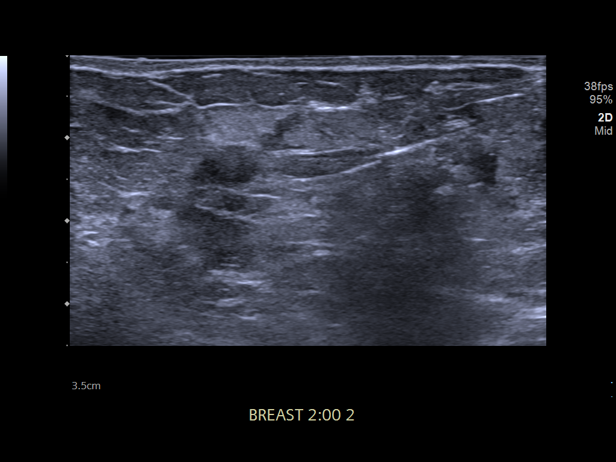

[15 of 25 positions shown; findings below may reference images not displayed]



Prior to biopsy, targeted ultrasound is performed in the UPPER-OUTER
QUADRANT of the LEFT breast. In this region, a total of 5 small
irregular masses are identified in a radial distribution. Lesions
are estimated to span at least 3.6 centimeters.

The first is in the retroareolar region measuring 0.7 x 0 4 by
centimeters.

The second is in the retroareolar region, 0 4 x 0.4 by
centimeters.

The third is 1 centimeter from the nipple, 0.5 x 0.6 x 0 4
centimeters.

The fourth is 2 centimeters from nipple, 1.2 x 0.8 by
centimeters.

The 5th is 2 centimeters from the nipple, 0.9 x 0 4 by
centimeters.

Site 1: LEFT breast 2 o'clock retroareolar. Lesion quadrant: UPPER
OUTER QUADRANT, ribbon clip

Using sterile technique and lidocaine and lidocaine with epinephrine
as local anesthetic, under direct ultrasound visualization, a 12
gauge Md. Golam device was used to perform biopsy of mass in the
2 o'clock retroareolar region of the LEFT breast using a superior to
inferior approach. At the conclusion of the procedure ribbon tissue
marker clip was deployed into the biopsy cavity.

Site 2: LEFT breast 2 o'clock 2 centimeters from the nipple. Lesion
quadrant: UPPER-OUTER QUADRANT LEFT breast, wing clip

Using sterile technique and lidocaine and lidocaine with epinephrine
as local anesthetic, under direct ultrasound visualization, a 12
gauge Md. Golam device was used to perform biopsy of mass in the
2 o'clock location the LEFT breast 2 centimeters from the nipple
using a superior to inferior approach. At the conclusion of the
procedure wing tissue marker clip was deployed into the biopsy
cavity.

Follow-up 2-view mammogram was performed and dictated separately.
IMPRESSION: Ultrasound guided biopsy of LEFT breast masses. No apparent
complications.

ADDENDUM:
Pathology revealed BREAST, LEFT [DATE], RETROAREOLAR: GRADE II
INVASIVE MAMMARY CARCINOMA WITH DUCTAL AND LOBULAR FEATURES (ribbon
clip). This was found to be concordant by Dr. Kamenko Ramusha.

Pathology revealed BREAST, LEFT [DATE], 2 cmfn: GRADE II INVASIVE
DUCTAL CARCINOMA, DUCTAL CARCINOMA IN SITU (DCIS) solid type
(nuclear grade 2) - Calcifications present (wing shaped clip). This
was found to be concordant by Dr. Kamenko Ramusha.

Given the presence of multiple masses in the 2 o'clock location of
the LEFT breast (up to 5 lesions seen at the time of biopsy), MRI is
recommended to determine extent of disease.

Pathology results were discussed with the patient by telephone. The
patient reported doing well after the biopsies with tenderness at
the sites. Post biopsy instructions and care were reviewed and
questions were answered. The patient was encouraged to [facility] for any additional concerns.

On November 04, 2021, reports and recommendations sent to Hezzi Saad
RT(R) (M) (CT) of [HOSPITAL] Mammography who will assist in surgical
referral.

Pathology results reported by Balraj Mancilla RN on 11/05/2021.



Prior to biopsy, targeted ultrasound is performed in the UPPER-OUTER
QUADRANT of the LEFT breast. In this region, a total of 5 small
irregular masses are identified in a radial distribution. Lesions
are estimated to span at least 3.6 centimeters.

The first is in the retroareolar region measuring 0.7 x 0 4 by
centimeters.

The second is in the retroareolar region, 0 4 x 0.4 by
centimeters.

The third is 1 centimeter from the nipple, 0.5 x 0.6 x 0 4
centimeters.

The fourth is 2 centimeters from nipple, 1.2 x 0.8 by
centimeters.

The 5th is 2 centimeters from the nipple, 0.9 x 0 4 by
centimeters.

Site 1: LEFT breast 2 o'clock retroareolar. Lesion quadrant: UPPER
OUTER QUADRANT, ribbon clip

Using sterile technique and lidocaine and lidocaine with epinephrine
as local anesthetic, under direct ultrasound visualization, a 12
gauge Md. Golam device was used to perform biopsy of mass in the
2 o'clock retroareolar region of the LEFT breast using a superior to
inferior approach. At the conclusion of the procedure ribbon tissue
marker clip was deployed into the biopsy cavity.

Site 2: LEFT breast 2 o'clock 2 centimeters from the nipple. Lesion
quadrant: UPPER-OUTER QUADRANT LEFT breast, wing clip

Using sterile technique and lidocaine and lidocaine with epinephrine
as local anesthetic, under direct ultrasound visualization, a 12
gauge Md. Golam device was used to perform biopsy of mass in the
2 o'clock location the LEFT breast 2 centimeters from the nipple
using a superior to inferior approach. At the conclusion of the
procedure wing tissue marker clip was deployed into the biopsy
cavity.

Follow-up 2-view mammogram was performed and dictated separately.
IMPRESSION: Ultrasound guided biopsy of LEFT breast masses. No apparent
complications.

## 2022-11-23 DIAGNOSIS — M4326 Fusion of spine, lumbar region: Secondary | ICD-10-CM | POA: Diagnosis not present

## 2022-11-23 DIAGNOSIS — M2550 Pain in unspecified joint: Secondary | ICD-10-CM | POA: Diagnosis not present

## 2022-11-23 DIAGNOSIS — M25569 Pain in unspecified knee: Secondary | ICD-10-CM | POA: Diagnosis not present

## 2022-11-23 DIAGNOSIS — Z79899 Other long term (current) drug therapy: Secondary | ICD-10-CM | POA: Diagnosis not present

## 2022-11-23 DIAGNOSIS — M545 Low back pain, unspecified: Secondary | ICD-10-CM | POA: Diagnosis not present

## 2022-11-23 DIAGNOSIS — Z79891 Long term (current) use of opiate analgesic: Secondary | ICD-10-CM | POA: Diagnosis not present

## 2022-11-23 DIAGNOSIS — G894 Chronic pain syndrome: Secondary | ICD-10-CM | POA: Diagnosis not present

## 2022-11-24 DIAGNOSIS — F411 Generalized anxiety disorder: Secondary | ICD-10-CM | POA: Diagnosis not present

## 2022-11-24 DIAGNOSIS — E1142 Type 2 diabetes mellitus with diabetic polyneuropathy: Secondary | ICD-10-CM | POA: Diagnosis not present

## 2022-11-24 DIAGNOSIS — I10 Essential (primary) hypertension: Secondary | ICD-10-CM | POA: Diagnosis not present

## 2022-11-24 DIAGNOSIS — Z794 Long term (current) use of insulin: Secondary | ICD-10-CM | POA: Diagnosis not present

## 2022-12-02 ENCOUNTER — Encounter: Payer: Self-pay | Admitting: Emergency Medicine

## 2022-12-02 DIAGNOSIS — F17210 Nicotine dependence, cigarettes, uncomplicated: Secondary | ICD-10-CM | POA: Diagnosis not present

## 2022-12-02 DIAGNOSIS — E119 Type 2 diabetes mellitus without complications: Secondary | ICD-10-CM | POA: Diagnosis not present

## 2022-12-02 DIAGNOSIS — E782 Mixed hyperlipidemia: Secondary | ICD-10-CM | POA: Diagnosis not present

## 2022-12-02 DIAGNOSIS — L7621 Postprocedural hemorrhage and hematoma of skin and subcutaneous tissue following a dermatologic procedure: Secondary | ICD-10-CM | POA: Diagnosis not present

## 2022-12-02 DIAGNOSIS — Z91040 Latex allergy status: Secondary | ICD-10-CM | POA: Diagnosis not present

## 2022-12-02 DIAGNOSIS — I10 Essential (primary) hypertension: Secondary | ICD-10-CM | POA: Diagnosis not present

## 2022-12-02 DIAGNOSIS — I2510 Atherosclerotic heart disease of native coronary artery without angina pectoris: Secondary | ICD-10-CM | POA: Diagnosis not present

## 2022-12-02 DIAGNOSIS — Z794 Long term (current) use of insulin: Secondary | ICD-10-CM | POA: Diagnosis not present

## 2022-12-02 DIAGNOSIS — Z7984 Long term (current) use of oral hypoglycemic drugs: Secondary | ICD-10-CM | POA: Diagnosis not present

## 2022-12-02 DIAGNOSIS — Z4801 Encounter for change or removal of surgical wound dressing: Secondary | ICD-10-CM | POA: Diagnosis not present

## 2022-12-03 ENCOUNTER — Telehealth: Payer: Self-pay | Admitting: *Deleted

## 2022-12-03 NOTE — Telephone Encounter (Signed)
Surgical Date: 06/25/2022 Procedure: Simple Mastectomy, Left  Surgical Date: 07/26/2022 Procedure: Evacuation of Hematoma, Breast, Left Debridement of Left Breast Wound  Received call from patient [phone]~ telephone.   Patient reports that incision opened on left breast on 12/02/2022. States that copious amount of blood was draining from opening. Patient [facility]. Reports that breast wound was packed, but then she bled through dressing and area was re-packed prior to discharge.   Requested appointment with Dr. Lovell Sheehan for wound care. Appointment scheduled for 12/07/2022.

## 2022-12-07 ENCOUNTER — Ambulatory Visit: Payer: Medicare HMO | Admitting: General Surgery

## 2022-12-10 ENCOUNTER — Institutional Professional Consult (permissible substitution): Payer: Medicare HMO | Admitting: Pulmonary Disease

## 2022-12-21 DIAGNOSIS — M2550 Pain in unspecified joint: Secondary | ICD-10-CM | POA: Diagnosis not present

## 2022-12-21 DIAGNOSIS — M25569 Pain in unspecified knee: Secondary | ICD-10-CM | POA: Diagnosis not present

## 2022-12-21 DIAGNOSIS — G894 Chronic pain syndrome: Secondary | ICD-10-CM | POA: Diagnosis not present

## 2022-12-21 DIAGNOSIS — M545 Low back pain, unspecified: Secondary | ICD-10-CM | POA: Diagnosis not present

## 2022-12-21 DIAGNOSIS — M4326 Fusion of spine, lumbar region: Secondary | ICD-10-CM | POA: Diagnosis not present

## 2022-12-21 DIAGNOSIS — Z79891 Long term (current) use of opiate analgesic: Secondary | ICD-10-CM | POA: Diagnosis not present

## 2022-12-30 DIAGNOSIS — F330 Major depressive disorder, recurrent, mild: Secondary | ICD-10-CM | POA: Diagnosis not present

## 2022-12-30 DIAGNOSIS — L299 Pruritus, unspecified: Secondary | ICD-10-CM | POA: Diagnosis not present

## 2022-12-30 DIAGNOSIS — F411 Generalized anxiety disorder: Secondary | ICD-10-CM | POA: Diagnosis not present

## 2022-12-30 DIAGNOSIS — Z794 Long term (current) use of insulin: Secondary | ICD-10-CM | POA: Diagnosis not present

## 2022-12-30 DIAGNOSIS — Z951 Presence of aortocoronary bypass graft: Secondary | ICD-10-CM | POA: Diagnosis not present

## 2022-12-30 DIAGNOSIS — E119 Type 2 diabetes mellitus without complications: Secondary | ICD-10-CM | POA: Diagnosis not present

## 2022-12-30 DIAGNOSIS — I25119 Atherosclerotic heart disease of native coronary artery with unspecified angina pectoris: Secondary | ICD-10-CM | POA: Diagnosis not present

## 2023-01-22 ENCOUNTER — Other Ambulatory Visit: Payer: Self-pay | Admitting: Hematology

## 2023-01-25 ENCOUNTER — Other Ambulatory Visit: Payer: Self-pay | Admitting: *Deleted

## 2023-01-25 NOTE — Telephone Encounter (Signed)
Anastrozole refill approved.  Patient tolerating and is to continue therapy. 

## 2023-02-05 ENCOUNTER — Other Ambulatory Visit: Payer: Self-pay | Admitting: Cardiology

## 2023-02-15 ENCOUNTER — Other Ambulatory Visit: Payer: Self-pay

## 2023-02-15 ENCOUNTER — Inpatient Hospital Stay: Payer: Medicare HMO | Attending: Hematology

## 2023-02-15 DIAGNOSIS — Z79811 Long term (current) use of aromatase inhibitors: Secondary | ICD-10-CM | POA: Diagnosis not present

## 2023-02-15 DIAGNOSIS — Z170 Estrogen receptor positive status [ER+]: Secondary | ICD-10-CM | POA: Diagnosis not present

## 2023-02-15 DIAGNOSIS — M8580 Other specified disorders of bone density and structure, unspecified site: Secondary | ICD-10-CM | POA: Insufficient documentation

## 2023-02-15 DIAGNOSIS — C50412 Malignant neoplasm of upper-outer quadrant of left female breast: Secondary | ICD-10-CM | POA: Diagnosis present

## 2023-02-15 LAB — COMPREHENSIVE METABOLIC PANEL
ALT: 24 U/L (ref 0–44)
AST: 27 U/L (ref 15–41)
Albumin: 4 g/dL (ref 3.5–5.0)
Alkaline Phosphatase: 70 U/L (ref 38–126)
Anion gap: 10 (ref 5–15)
BUN: 16 mg/dL (ref 8–23)
CO2: 25 mmol/L (ref 22–32)
Calcium: 10.4 mg/dL — ABNORMAL HIGH (ref 8.9–10.3)
Chloride: 101 mmol/L (ref 98–111)
Creatinine, Ser: 1.17 mg/dL — ABNORMAL HIGH (ref 0.44–1.00)
GFR, Estimated: 50 mL/min — ABNORMAL LOW (ref >60)
Glucose, Bld: 126 mg/dL — ABNORMAL HIGH (ref 70–99)
Potassium: 4.3 mmol/L (ref 3.5–5.1)
Sodium: 136 mmol/L (ref 135–145)
Total Bilirubin: 0.5 mg/dL (ref 0.3–1.2)
Total Protein: 7.8 g/dL (ref 6.5–8.1)

## 2023-02-15 LAB — CBC WITH DIFFERENTIAL/PLATELET
Abs Immature Granulocytes: 0.01 10*3/uL (ref 0.00–0.07)
Basophils Absolute: 0 10*3/uL (ref 0.0–0.1)
Basophils Relative: 0 %
Eosinophils Absolute: 0 10*3/uL (ref 0.0–0.5)
Eosinophils Relative: 1 %
HCT: 45.2 % (ref 36.0–46.0)
Hemoglobin: 14.7 g/dL (ref 12.0–15.0)
Immature Granulocytes: 0 %
Lymphocytes Relative: 44 %
Lymphs Abs: 2.7 10*3/uL (ref 0.7–4.0)
MCH: 29.6 pg (ref 26.0–34.0)
MCHC: 32.5 g/dL (ref 30.0–36.0)
MCV: 90.9 fL (ref 80.0–100.0)
Monocytes Absolute: 0.6 10*3/uL (ref 0.1–1.0)
Monocytes Relative: 10 %
Neutro Abs: 2.9 10*3/uL (ref 1.7–7.7)
Neutrophils Relative %: 45 %
Platelets: 241 10*3/uL (ref 150–400)
RBC: 4.97 MIL/uL (ref 3.87–5.11)
RDW: 15.1 % (ref 11.5–15.5)
WBC: 6.2 10*3/uL (ref 4.0–10.5)
nRBC: 0 % (ref 0.0–0.2)

## 2023-02-15 LAB — VITAMIN D 25 HYDROXY (VIT D DEFICIENCY, FRACTURES): Vit D, 25-Hydroxy: 53.71 ng/mL (ref 30–100)

## 2023-02-16 ENCOUNTER — Inpatient Hospital Stay: Payer: Medicare HMO

## 2023-02-21 ENCOUNTER — Other Ambulatory Visit: Payer: Self-pay | Admitting: Cardiology

## 2023-02-23 ENCOUNTER — Inpatient Hospital Stay: Payer: Medicare PPO | Attending: Hematology | Admitting: Hematology

## 2023-02-23 VITALS — BP 165/88 | HR 90 | Temp 98.7°F | Resp 18 | Ht 64.0 in | Wt 215.9 lb

## 2023-02-23 DIAGNOSIS — C50412 Malignant neoplasm of upper-outer quadrant of left female breast: Secondary | ICD-10-CM | POA: Diagnosis present

## 2023-02-23 DIAGNOSIS — Z79811 Long term (current) use of aromatase inhibitors: Secondary | ICD-10-CM | POA: Insufficient documentation

## 2023-02-23 DIAGNOSIS — Z170 Estrogen receptor positive status [ER+]: Secondary | ICD-10-CM

## 2023-02-23 DIAGNOSIS — F17210 Nicotine dependence, cigarettes, uncomplicated: Secondary | ICD-10-CM | POA: Insufficient documentation

## 2023-02-23 DIAGNOSIS — R232 Flushing: Secondary | ICD-10-CM | POA: Diagnosis not present

## 2023-02-23 DIAGNOSIS — Z1721 Progesterone receptor positive status: Secondary | ICD-10-CM | POA: Diagnosis not present

## 2023-02-23 DIAGNOSIS — Z951 Presence of aortocoronary bypass graft: Secondary | ICD-10-CM | POA: Insufficient documentation

## 2023-02-23 DIAGNOSIS — M8580 Other specified disorders of bone density and structure, unspecified site: Secondary | ICD-10-CM | POA: Insufficient documentation

## 2023-02-23 DIAGNOSIS — Z9012 Acquired absence of left breast and nipple: Secondary | ICD-10-CM | POA: Insufficient documentation

## 2023-02-23 MED ORDER — ANASTROZOLE 1 MG PO TABS: 1.0000 mg | ORAL_TABLET | Freq: Every day | ORAL | 3 refills | Status: DC

## 2023-02-23 MED ORDER — GABAPENTIN 100 MG PO CAPS: 100.0000 mg | ORAL_CAPSULE | Freq: Every day | ORAL | 1 refills | Status: DC

## 2023-02-23 NOTE — Patient Instructions (Addendum)
[facility]  Discharge Instructions  You were seen and examined today by Dr. Ellin Saba.  Dr. Ellin Saba discussed your most recent lab work which is stable. You can stop taking your Calcium tablets.  Your next mammogram is due in June.  Continue Anastrozole as prescribed. For the hot flashes, Dr. Ellin Saba will change the Gabapentin 100mg  once daily at bedtime.  Follow-up as scheduled.  Thank you for choosing [facility] to provide your oncology and hematology care.   To afford each patient quality time with our provider, please arrive at least 15 minutes before your scheduled appointment time. You may need to reschedule your appointment if you arrive late (10 or more minutes). Arriving late affects you and other patients whose appointments are after yours.  Also, if you miss three or more appointments without notifying the office, you may be dismissed from the clinic at the provider's discretion.    Again, thank you for choosing [facility].  Our hope is that these requests will decrease the amount of time that you wait before being seen by our physicians.   If you have a lab appointment with the Cancer Center - please note that after April 8th, all labs will be drawn in the cancer center.  You do not have to check in or register with the main entrance as you have in the past but will complete your check-in at the cancer center.            _____________________________________________________________  Should you have questions after your visit to [facility], please contact our office at [phone] and follow the prompts.  Our office hours are 8:00 a.m. to 4:30 p.m. Monday - Thursday and 8:00 a.m. to 2:30 p.m. Friday.  Please note that voicemails left after 4:00 p.m. may not be returned until the following business day.  We are closed weekends and all major holidays.  You do have access to a nurse 24-7,  just call the main number to the clinic [phone] and do not press any options, hold on the line and a nurse will answer the phone.    For prescription refill requests, have your pharmacy contact our office and allow 72 hours.    Masks are no longer required in the cancer centers. If you would like for your care team to wear a mask while they are taking care of you, please let them know. You may have one support person who is at least 72 years old accompany you for your appointments.

## 2023-02-23 NOTE — Progress Notes (Signed)
[facility] [address] [place] [address]    Clinic Day:  02/23/2023  Referring physician: Catalina Lunger, DO  Patient Care Team: Catalina Lunger, DO as PCP - General (Family Medicine) Jonelle Sidle, MD as PCP - Cardiology (Cardiology) Doreatha Massed, MD as Medical Oncologist (Medical Oncology) Therese Sarah, RN as Oncology Nurse Navigator (Medical Oncology)   ASSESSMENT & PLAN:   Assessment: 1. Stage II (T3N0G2) left breast cancer: - Abnormal screening mammogram on 10/14/2021, followed by diagnostic mammogram on 10/27/2021 - Left breast ultrasound (11/16/2021): Irregular mass containing calcifications in the left breast at 2 o'clock position 2 cm from nipple measuring 1.7 x 0.6 x 1 cm.  In addition there are several similar-appearing masses extending from this mass towards the nipple with a mass in the left breast 2:00 retroareolar measuring 0.4 x 0.4 x 0.4 cm.  Together these masses span a distance of approximately 2.7 cm.  No lymphadenopathy in the axilla. 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 - She will underwent CABG x3 on 11/16/2021 - 06/25/2022: Left simple mastectomy - Pathology: 5.5 cm infiltrating ductal carcinoma, grade 2, margins negative, PT3 PNX - 07/26/2022: Incision and debridement of left mastectomy wound - Anastrozole started on 08/03/2022   2. Social/family history: - She lives at home with roommate.  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  Current smoker, half pack per day for 40 years. - Brother had cancer.    Plan: 1. Stage II (T3N0G2) left breast cancer, ER/PR +, HER2-: - She is tolerating anastrozole reasonably well. - She reports hot flashes predominantly at nighttime.  At last visit I started her on gabapentin 300 mg at bedtime which helped  with hot flashes.  However this was discontinued by her doctor as she was told "it was not good for her heart".  She did not report any side effects from it. - Recommend trying gabapentin 100 mg at bedtime. - Continue anastrozole.  I have reviewed her mammogram from 11/04/2022: BI-RADS category one of the right breast.  Physical exam today reveals left mastectomy site is within normal limits with no suspicious nodules. - Recommend follow-up in 6 months with repeat labs and exam.  2.  Osteopenia (DEXA 12/18/2021 T-score -1.2): - Vitamin D level is 53.7.  However calcium is elevated at 10.4, albumin 4.0.  Recommend discontinuing calcium supplements.    Orders Placed This Encounter  Procedures   MM Digital Screening Unilat R    Standing Status:   Future    Standing Expiration Date:   02/23/2024    Order Specific Question:   Reason for Exam (SYMPTOM  OR DIAGNOSIS REQUIRED)    Answer:   screening mammogram    Order Specific Question:   Preferred imaging location?    Answer:   [facility]    Order Specific Question:   Release to patient    Answer:   Immediate   CBC with Differential    Standing Status:   Future    Standing Expiration Date:   02/23/2024   Comprehensive metabolic panel    Standing Status:   Future    Standing Expiration Date:   02/23/2024   Vitamin D 25 hydroxy    Standing Status:   Future    Standing Expiration Date:  02/23/2024      I,Katie Daubenspeck,acting as a scribe for Doreatha Massed, MD.,have documented all relevant documentation on the behalf of Doreatha Massed, MD,as directed by  Doreatha Massed, MD while in the presence of Doreatha Massed, MD.   I, Doreatha Massed MD, have reviewed the above documentation for accuracy and completeness, and I agree with the above.   Doreatha Massed, MD   10/2/20243:39 PM  CHIEF COMPLAINT:   Diagnosis: left breast cancer    Cancer Staging  Breast cancer of upper-outer quadrant of left  female breast [facility]) Staging form: Breast, AJCC 8th Edition - Clinical stage from 12/10/2021: Stage IIA (cT3, cN0, cM0, G2, ER+, PR+, HER2-) - Signed by Doreatha Massed, MD on 08/03/2022    Prior Therapy: left mastectomy 06/25/22   Current Therapy:  anastrozole    HISTORY OF PRESENT ILLNESS:   Oncology History  Breast cancer of upper-outer quadrant of left female breast (HCC)  12/10/2021 Initial Diagnosis   Breast cancer of upper-outer quadrant of left female breast (HCC)   12/10/2021 Cancer Staging   Staging form: Breast, AJCC 8th Edition - Clinical stage from 12/10/2021: Stage IIA (cT3, cN0, cM0, G2, ER+, PR+, HER2-) - Signed by Doreatha Massed, MD on 08/03/2022 Histopathologic type: Infiltrating duct carcinoma, NOS Stage prefix: Initial diagnosis Histologic grading system: 3 grade system      INTERVAL HISTORY:   Remmy is a 72 y.o. female presenting to clinic today for follow up of left breast cancer. She was last seen by me on 10/13/22.  Since her last visit, she underwent screening right mammogram on 11/04/22. Results were negative.  Today, she states that she is doing well overall. Her appetite level is at 70%. Her energy level is at 10%.  PAST MEDICAL HISTORY:   Past Medical History: 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   COPD (chronic obstructive pulmonary disease) (HCC)    Depression    Essential hypertension    Hyperlipidemia    Myocardial infarction (HCC)    [place] to the LAD   OSA (obstructive sleep apnea)    CPAP qHS   PONV (postoperative nausea and vomiting)    Type 2 diabetes mellitus (HCC)     Surgical History: Past Surgical History:  Procedure Laterality Date   BACK SURGERY     BREAST SURGERY      CATARACT EXTRACTION W/PHACO Right 10/24/2020   Procedure: CATARACT EXTRACTION PHACO AND INTRAOCULAR LENS PLACEMENT (IOC);  Surgeon: Fabio Pierce, MD;  Location: [facility]S;  Service: Ophthalmology;  Laterality: Right;  CDE: 10.87   CATARACT EXTRACTION W/PHACO Left 12/01/2020   Procedure: CATARACT EXTRACTION PHACO AND INTRAOCULAR LENS PLACEMENT LEFT EYE;  Surgeon: Fabio Pierce, MD;  Location: [facility]S;  Service: Ophthalmology;  Laterality: Left;  CDE  8.62   CORONARY ARTERY BYPASS GRAFT N/A 11/16/2021   Procedure: CORONARY ARTERY BYPASS GRAFTING (CABG) times three using the left internal mammary and right saphenous vein.;  Surgeon: Alleen Borne, MD;  Location: [facility] OR;  Service: Open Heart Surgery;  Laterality: N/A;   IABP INSERTION N/A 11/16/2021   Procedure: IABP Insertion;  Surgeon: Marykay Lex, MD;  Location: [facility] INVASIVE CV LAB;  Service: Cardiovascular;  Laterality: N/A;   LEFT HEART CATH AND CORONARY ANGIOGRAPHY  N/A 11/16/2021   Procedure: LEFT HEART CATH AND CORONARY ANGIOGRAPHY;  Surgeon: Marykay Lex, MD;  Location: [facility] INVAS[facility] CV LAB;  Service: Cardiovascular;  Laterality: N/A;   REPLACEMENT TOTAL KNEE Right    SIMPLE MASTECTOMY WITH AXILLARY SENTINEL NODE BIOPSY Left 06/25/2022   Procedure: SIMPLE MASTECTOMY;  Surgeon: Franky Macho, MD;  Location: [facility];  Service: General;  Laterality: Left;   TEE WITHOUT CARDIOVERSION N/A 11/16/2021   Procedure: TRANSESOPHAGEAL ECHOCARDIOGRAM (TEE);  Surgeon: Alleen Borne, MD;  Location: [facility] OR;  Service: Open Heart Surgery;  Laterality: N/A;   TUBAL LIGATION     WOUND EXPLORATION Left 07/26/2022   Procedure: EVACUATION OF HEMATOMA BREAST, DEBRIDEMENT OF LEFT BREAST WOUND;  Surgeon: Franky Macho, MD;  Location: [facility];  Service: General;  Laterality: Left;    Social History: Social History   Socioeconomic History   Marital status: Widowed    Spouse name: Not on file   Number of children: Not on file   Years of education: Not on file   Highest  education level: Not on file  Occupational History   Not on file  Tobacco Use   Smoking status: Every Day    Current packs/day: 0.50    Average packs/day: 0.5 packs/day for 40.0 years (20.0 ttl pk-yrs)    Types: Cigarettes   Smokeless tobacco: Never  Vaping Use   Vaping status: Never Used  Substance and Sexual Activity   Alcohol use: Never   Drug use: Never   Sexual activity: Not Currently  Other Topics Concern   Not on file  Social History Narrative   Not on file   Social Determinants of Health   Financial Resource Strain: Low Risk  (02/10/2023)   Received from [facility]   Overall Financial Resource Strain (CARDIA)    Difficulty of Paying Living Expenses: Not hard at all  Recent Concern: Financial Resource Strain - Medium Risk (11/24/2022)   Received from [facility]   Overall Financial Resource Strain (CARDIA)    Difficulty of Paying Living Expenses: Somewhat hard  Food Insecurity: No Food Insecurity (02/10/2023)   Received from [facility]   Hunger Vital Sign    Worried About Running Out of Food in the Last Year: Never true    Ran Out of Food in the Last Year: Never true  Recent Concern: Food Insecurity - Food Insecurity Present (11/24/2022)   Received from [facility]   Hunger Vital Sign    Worried About Running Out of Food in the Last Year: Sometimes true    Ran Out of Food in the Last Year: Sometimes true  Transportation Needs: Unmet Transportation Needs (12/30/2022)   Received from [facility]   PRAPARE - Transportation    Lack of Transportation (Medical): Yes    Lack of Transportation (Non-Medical): Yes  Physical Activity: Insufficiently Active (12/30/2022)   Received from [facility]   Exercise Vital Sign    Days of Exercise per Week: 4 days    Minutes of Exercise per Session: 30 min  Stress: Stress Concern Present (02/10/2023)   Received from [facility] - Occupational Stress Questionnaire     Feeling of Stress : To some extent  Social Connections: Unknown (02/10/2023)   Received from [facility]   Social Connection and Isolation Panel [NHANES]    Frequency of Communication with Friends and Family: More than three times a week    Frequency  of Social Gatherings with Friends and Family: Three times a week    Attends Religious Services: 1 to 4 times per year    Active Member of Clubs or Organizations: No    Attends Engineer, structural: More than 4 times per year    Marital Status: Not on file  Intimate Partner Violence: Not At Risk (02/10/2023)   Received from [facility]   Humiliation, Afraid, Rape, and Kick questionnaire    Fear of Current or Ex-Partner: No    Emotionally Abused: No    Physically Abused: No    Sexually Abused: No    Family History: Family History  Problem Relation Age of Onset   COPD Mother    Alcohol abuse Father    Hypertension Sister    Heart attack Brother     Current Medications:  Current Outpatient Medications:    acetaminophen (TYLENOL) 325 MG tablet, Take 2 tablets (650 mg total) by mouth every 6 (six) hours as needed for mild pain (or Fever >/= 101)., Disp: 100 tablet, Rfl: 2   Ascorbic Acid (VITAMIN C) 1000 MG tablet, Take 1,000 mg by mouth daily., Disp: , Rfl:    aspirin EC 81 MG tablet, Take 1 tablet (81 mg total) by mouth daily. Swallow whole., Disp: 30 tablet, Rfl: 12   baclofen (LIORESAL) 10 MG tablet, Take 10 mg by mouth 2 (two) times daily as needed., Disp: , Rfl:    cholecalciferol (VITAMIN D3) 25 MCG (1000 UNIT) tablet, Take 1,000 Units by mouth daily., Disp: , Rfl:    citalopram (CELEXA) 20 MG tablet, Take 1 tablet by mouth daily., Disp: , Rfl:    clopidogrel (PLAVIX) 75 MG tablet, TAKE 1 TABLET BY MOUTH EVERY DAY, Disp: 90 tablet, Rfl: 1   cyclobenzaprine (FLEXERIL) 10 MG tablet, Take 10 mg by mouth 2 (two) times daily as needed for muscle spasms., Disp: , Rfl:    DULoxetine (CYMBALTA) 60 MG capsule, Take 60 mg by  mouth daily., Disp: , Rfl:    escitalopram (LEXAPRO) 10 MG tablet, Take 10 mg by mouth daily., Disp: , Rfl:    ezetimibe (ZETIA) 10 MG tablet, Take 10 mg by mouth daily., Disp: , Rfl:    furosemide (LASIX) 40 MG tablet, TAKE 1 AND 1/2 TABLETS(60 MG) BY MOUTH DAILY, Disp: 135 tablet, Rfl: 2   glimepiride (AMARYL) 4 MG tablet, Take 4 mg by mouth daily with breakfast., Disp: , Rfl:    hydrOXYzine (ATARAX) 25 MG tablet, Take 25 mg by mouth daily., Disp: , Rfl:    isosorbide mononitrate (IMDUR) 30 MG 24 hr tablet, Take 1 tablet (30 mg total) by mouth daily., Disp: 30 tablet, Rfl: 2   LANTUS SOLOSTAR 100 UNIT/ML Solostar Pen, Inject 25 Units into the skin at bedtime., Disp: 15 mL, Rfl: 11   losartan (COZAAR) 100 MG tablet, Take 1 tablet by mouth daily., Disp: , Rfl:    lubiprostone (AMITIZA) 24 MCG capsule, Take 24 mcg by mouth 2 (two) times daily., Disp: , Rfl:    metFORMIN (GLUCOPHAGE) 500 MG tablet, Take 500-1,000 mg by mouth See admin instructions. Take 2 tablets (1000 mg) in the morning and Take 1 tablets (500 mg) in the evening, Disp: , Rfl:    metoprolol tartrate (LOPRESSOR) 25 MG tablet, Take 1 tablet (25 mg total) by mouth 2 (two) times daily., Disp: 60 tablet, Rfl: 1   Multiple Vitamin (MULTIVITAMIN) capsule, Take 1 capsule by mouth daily., Disp: , Rfl:    nicotine (NICODERM  CQ - DOSED IN MG/24 HOURS) 21 mg/24hr patch, Place 1 patch (21 mg total) onto the skin daily., Disp: 30 patch, Rfl: 0   nitroGLYCERIN (NITROSTAT) 0.4 MG SL tablet, Place 1 tablet (0.4 mg total) under the tongue every 5 (five) minutes x 3 doses as needed for chest pain (if no relief after 3rd dose, proceed to ED or call 911)., Disp: 75 tablet, Rfl: 1   ondansetron (ZOFRAN-ODT) 4 MG disintegrating tablet, Take 4 mg by mouth every 8 (eight) hours as needed for vomiting or nausea. For 7 days supply, Disp: , Rfl:    oxyCODONE-acetaminophen (PERCOCET) 10-325 MG tablet, Take 1 tablet by mouth every 4 (four) hours as needed for  pain., Disp: 15 tablet, Rfl: 0   pregabalin (LYRICA) 25 MG capsule, Take 25 mg by mouth 2 (two) times daily., Disp: , Rfl:    rosuvastatin (CRESTOR) 10 MG tablet, Take 10 mg by mouth daily., Disp: , Rfl:    vitamin B-12 (CYANOCOBALAMIN) 1000 MCG tablet, Take 1,000 mcg by mouth daily., Disp: , Rfl:    anastrozole (ARIMIDEX) 1 MG tablet, Take 1 tablet (1 mg total) by mouth daily. TAKE 1 TABLET(1 MG) BY MOUTH DAILY, Disp: 90 tablet, Rfl: 3   gabapentin (NEURONTIN) 100 MG capsule, Take 1 capsule (100 mg total) by mouth at bedtime., Disp: 90 capsule, Rfl: 1   Allergies: Allergies  Allergen Reactions   Latex Rash    REVIEW OF SYSTEMS:   Review of Systems  Constitutional:  Negative for chills, fatigue and fever.  HENT:   Negative for lump/mass, mouth sores, nosebleeds, sore throat and trouble swallowing.   Eyes:  Negative for eye problems.  Respiratory:  Positive for cough. Negative for shortness of breath.   Cardiovascular:  Positive for palpitations. Negative for chest pain and leg swelling.  Gastrointestinal:  Positive for constipation and nausea. Negative for abdominal pain, diarrhea and vomiting.  Genitourinary:  Negative for bladder incontinence, difficulty urinating, dysuria, frequency, hematuria and nocturia.   Musculoskeletal:  Negative for arthralgias, back pain, flank pain, myalgias and neck pain.  Skin:  Negative for itching and rash.  Neurological:  Negative for dizziness, headaches and numbness.  Hematological:  Does not bruise/bleed easily.  Psychiatric/Behavioral:  Negative for depression, sleep disturbance and suicidal ideas. The patient is not nervous/anxious.   All other systems reviewed and are negative.    VITALS:   Blood pressure (!) 165/88, pulse 90, temperature 98.7 F (37.1 C), temperature source Oral, resp. rate 18, height 5\' 4"  (1.626 m), weight 215 lb 14.4 oz (97.9 kg), SpO2 99%.  Wt Readings from Last 3 Encounters:  02/23/23 215 lb 14.4 oz (97.9 kg)   10/13/22 230 lb (104.3 kg)  09/21/22 222 lb (100.7 kg)    Body mass index is 37.06 kg/m.  Performance status (ECOG): 1 - Symptomatic but completely ambulatory  PHYSICAL EXAM:   Physical Exam Vitals and nursing note reviewed. Exam conducted with a chaperone present.  Constitutional:      Appearance: Normal appearance.  Cardiovascular:     Rate and Rhythm: Normal rate and regular rhythm.     Pulses: Normal pulses.     Heart sounds: Normal heart sounds.  Pulmonary:     Effort: Pulmonary effort is normal.     Breath sounds: Normal breath sounds.  Abdominal:     Palpations: Abdomen is soft. There is no hepatomegaly, splenomegaly or mass.     Tenderness: There is no abdominal tenderness.  Musculoskeletal:     Right  lower leg: No edema.     Left lower leg: No edema.  Lymphadenopathy:     Cervical: No cervical adenopathy.     Right cervical: No superficial, deep or posterior cervical adenopathy.    Left cervical: No superficial, deep or posterior cervical adenopathy.     Upper Body:     Right upper body: No supraclavicular or axillary adenopathy.     Left upper body: No supraclavicular or axillary adenopathy.  Neurological:     General: No focal deficit present.     Mental Status: She is alert and oriented to person, place, and time.  Psychiatric:        Mood and Affect: Mood normal.        Behavior: Behavior normal.     LABS:      Latest Ref Rng & Units 02/15/2023    1:49 PM 10/06/2022    1:05 PM 07/30/2022    4:40 AM  CBC  WBC 4.0 - 10.5 K/uL 6.2  6.0  6.5   Hemoglobin 12.0 - 15.0 g/dL 19.1  47.8  29.5   Hematocrit 36.0 - 46.0 % 45.2  36.9  30.7   Platelets 150 - 400 K/uL 241  224  249       Latest Ref Rng & Units 02/15/2023    1:49 PM 10/06/2022    1:05 PM 07/30/2022    4:40 AM  CMP  Glucose 70 - 99 mg/dL 621  308  657   BUN 8 - 23 mg/dL 16  7  10    Creatinine 0.44 - 1.00 mg/dL 8.46  9.62  9.52   Sodium 135 - 145 mmol/L 136  138  136   Potassium 3.5 - 5.1 mmol/L  4.3  3.1  4.0   Chloride 98 - 111 mmol/L 101  103  104   CO2 22 - 32 mmol/L 25  27  23    Calcium 8.9 - 10.3 mg/dL 84.1  32.4  40.1   Total Protein 6.5 - 8.1 g/dL 7.8  6.5    Total Bilirubin 0.3 - 1.2 mg/dL 0.5  0.7    Alkaline Phos 38 - 126 U/L 70  59    AST 15 - 41 U/L 27  24    ALT 0 - 44 U/L 24  19       No results found for: "CEA1", "CEA" / No results found for: "CEA1", "CEA" No results found for: "PSA1" No results found for: "UUV253" No results found for: "CAN125"  No results found for: "TOTALPROTELP", "ALBUMINELP", "A1GS", "A2GS", "BETS", "BETA2SER", "GAMS", "MSPIKE", "SPEI" No results found for: "TIBC", "FERRITIN", "IRONPCTSAT" No results found for: "LDH"   STUDIES:   No results found.

## 2023-03-22 ENCOUNTER — Ambulatory Visit: Payer: Medicare HMO | Admitting: Cardiology

## 2023-03-28 ENCOUNTER — Ambulatory Visit: Payer: Medicare HMO | Attending: Cardiology | Admitting: Nurse Practitioner

## 2023-03-28 ENCOUNTER — Encounter: Payer: Self-pay | Admitting: Nurse Practitioner

## 2023-03-28 VITALS — BP 152/100 | HR 80 | Ht 64.0 in | Wt 222.4 lb

## 2023-03-28 DIAGNOSIS — N6323 Unspecified lump in the left breast, lower outer quadrant: Secondary | ICD-10-CM

## 2023-03-28 DIAGNOSIS — I2510 Atherosclerotic heart disease of native coronary artery without angina pectoris: Secondary | ICD-10-CM

## 2023-03-28 DIAGNOSIS — E782 Mixed hyperlipidemia: Secondary | ICD-10-CM

## 2023-03-28 DIAGNOSIS — I10 Essential (primary) hypertension: Secondary | ICD-10-CM

## 2023-03-28 DIAGNOSIS — C50412 Malignant neoplasm of upper-outer quadrant of left female breast: Secondary | ICD-10-CM

## 2023-03-28 DIAGNOSIS — Z170 Estrogen receptor positive status [ER+]: Secondary | ICD-10-CM

## 2023-03-28 NOTE — Patient Instructions (Signed)

## 2023-03-28 NOTE — Progress Notes (Unsigned)
Cardiology Office Note:  .   Date:  03/28/2023 ID:  Katelyn Kaufman, DOB 1950/06/20, MRN [id number] PCP: Katelyn Lunger, DO  [facility] Providers Cardiologist:  Katelyn Dell, MD    History of Present Illness: .   Katelyn Kaufman is a 72 y.o. female with a PMH of CAD, s/p CABG in June 2023, HTN, mixed HLD, OSA, T2DM, hx of breast cancer, s/p left mastectomy, who presents today for 6 month follow-up.   Last seen by Dr. Diona Kaufman on September 16, 2022.  She was doing well from a cardiac perspective.  Was being followed by oncology for treatment for her breast cancer, underwent mastectomy in February 2024. Reported she had not used her CPAP to treat her OSA for many years.   Today she presents for 6 month follow-up. She states she is doing well from a cardiac standpoint, however she has noticed a lump along left breast and reports itching skin along sternal incision. She has chronic pain and says she plans to speak to PCP for better pain management as medications were adjusted recently by pain management specialist and says her current medical management is not helping. Denies any chest pain, shortness of breath, palpitations, syncope, presyncope, dizziness, orthopnea, PND, swelling or significant weight changes, acute bleeding, or claudication.  ROS: Negative. See HPI.   Studies Reviewed: Marland Kitchen    TEE 10/2021:  Complications: No known complications during this procedure.  POST-OP IMPRESSIONS  _ Left Ventricle: has hyperdynamic systolic function, with an ejection  fraction  of 70%. The cavity size was normal. The wall motion is normal.  _ Right Ventricle: normal function. The cavity was normal. The wall motion  is  normal.  _ Aorta: there is no dissection present in the aorta.  _ Aortic Valve: The aortic valve appears unchanged from pre-bypass.  _ Mitral Valve: There is mild regurgitation.  _ Tricuspid Valve: There is mild regurgitation.   LHC 10/2021:    Mid LM to Prox LAD lesion is  95% stenosed with 99% stenosed side branch in Ost Cx.  Ramus lesion is 95% stenosed. - Trifucation lesion   Mid LAD stent is 5% stenosed.  Dist LAD stent is 10% stenosed.   Prox RCA lesion is 45% stenosed.   The left ventricular systolic function is normal.  The left ventricular ejection fraction is 50-55% by visual estimate.   There is no aortic valve stenosis.   ----------------------- Severe distal LM-trifurcation LAD, RI, LCx 95-99 % eccentric stenosis with minimal downstream disease. LAD has mid and distal vessel stent with diffuse calcification.  Stents are widely patent.  LAD reaches the apex.  1 very proximal ramus like small diagonal branch followed by 2 additional diagonal branches.  Too small for bypass. Ramus or medius is a large-caliber vessel that reaches the apex with distal branches. LCx courses as of the lateral OM/LPL distally after giving rise to small to moderate-sized OM1, OM 2 and OM 4 with minuscule OM 3.  Too small for grafting. RCA has proximal eccentric 45 to 50% stenosis with minimal distal disease giving rise to RPDA and 2 PL branches.  No significant disease in the RCA system. Preserved LVEF with mild mid to apical anterior hypokinesis. Hemodynamics:  Central AoP: 176/89 with a MAP 124 mmHg LVP/EDP: 172/5-15 mmHg   Recommendations: Urgent/emergent CVTS consultation with Dr. Laneta Kaufman performed in the Cath Lab.  Plan is for emergent CABG Continue IABP via RFA Radial sheath sutured in place to be used for arterial line  during procedure.  Removal in the CVICU post CABG with TR band placed by catheter supervisor.  Echo 07/2021:   1. Left ventricular ejection fraction, by estimation, is 60 to 65%. The  left ventricle has normal function. The left ventricle has no regional  wall motion abnormalities. There is mild left ventricular hypertrophy.  Left ventricular diastolic parameters  are consistent with Grade I diastolic dysfunction (impaired relaxation).   2. Right  ventricular systolic function is normal. The right ventricular  size is normal. Tricuspid regurgitation signal is inadequate for assessing  PA pressure.   3. The mitral valve is grossly normal. Trivial mitral valve  regurgitation.   4. The aortic valve is tricuspid. Aortic valve regurgitation is not  visualized.   5. The inferior vena cava is normal in size with greater than 50%  respiratory variability, suggesting right atrial pressure of 3 mmHg.   Comparison(s): No significant change from prior study.  Physical Exam:   VS:  BP 136/74 (BP Location: Right Arm, Patient Position: Sitting, Cuff Size: Normal)   Pulse 80   Ht 5\' 4"  (1.626 m)   Wt 222 lb 6.4 oz (100.9 kg)   BMI 38.17 kg/m    Wt Readings from Last 3 Encounters:  03/28/23 222 lb 6.4 oz (100.9 kg)  02/23/23 215 lb 14.4 oz (97.9 kg)  10/13/22 230 lb (104.3 kg)    GEN: Obese, 72 y.o. female in no acute distress, but appears to be in pain NECK: No JVD; No carotid bruits CARDIAC: S1/S2, RRR, no murmurs, rubs, gallops, small tennis ball sized lump to left lower lateral breast tissue. Keloid to sternal incision scar.  RESPIRATORY:  Clear to auscultation without rales, wheezing or rhonchi  ABDOMEN: Soft, non-tender, non-distended EXTREMITIES:  No edema; No deformity   ASSESSMENT AND PLAN: .    CAD, s/p CABG Stable with no anginal symptoms. No indication for ischemic evaluation. Continue current medication regimen. Heart healthy diet and regular cardiovascular exercise encouraged.   HTN BP elevated on arrival in setting of acute pain. BP on recheck during exam was at goal. Discussed to monitor BP at home at least 2 hours after medications and sitting for 5-10 minutes. No medication changes at this time. Heart healthy diet and regular cardiovascular exercise encouraged. Continue to follow with PCP.  Breast cancer, lump of left breast Following Oncology. Patient has noticed lump along left breast. See physical exam noted above.  Will route note to PCP and to Oncology. Continue to follow-up with Oncology.   Mixed HLD LDL 74 in 2023. Goal LDL < 55. Being managed by PCP. Recommend increasing Crestor to 20 mg daily if future labs show LDL is not at goal. Heart healthy diet and regular cardiovascular exercise encouraged.    Dispo: Follow-up with Dr. Diona Kaufman or APP in 6 months or sooner if anything changes.   Signed, Sharlene Dory, NP

## 2023-03-30 ENCOUNTER — Encounter: Payer: Self-pay | Admitting: Nurse Practitioner

## 2023-04-06 ENCOUNTER — Encounter (HOSPITAL_COMMUNITY): Payer: Self-pay

## 2023-04-06 ENCOUNTER — Emergency Department (HOSPITAL_COMMUNITY)
Admission: EM | Admit: 2023-04-06 | Discharge: 2023-04-06 | Disposition: A | Discharge status: Home / Self Care | Payer: Medicare HMO | Attending: Emergency Medicine | Admitting: Emergency Medicine

## 2023-04-06 ENCOUNTER — Other Ambulatory Visit: Payer: Self-pay

## 2023-04-06 ENCOUNTER — Emergency Department (HOSPITAL_COMMUNITY): Payer: Medicare HMO

## 2023-04-06 DIAGNOSIS — N179 Acute kidney failure, unspecified: Secondary | ICD-10-CM | POA: Insufficient documentation

## 2023-04-06 DIAGNOSIS — Z853 Personal history of malignant neoplasm of breast: Secondary | ICD-10-CM | POA: Insufficient documentation

## 2023-04-06 DIAGNOSIS — R0602 Shortness of breath: Secondary | ICD-10-CM | POA: Diagnosis not present

## 2023-04-06 DIAGNOSIS — Z7901 Long term (current) use of anticoagulants: Secondary | ICD-10-CM | POA: Insufficient documentation

## 2023-04-06 DIAGNOSIS — I2510 Atherosclerotic heart disease of native coronary artery without angina pectoris: Secondary | ICD-10-CM | POA: Insufficient documentation

## 2023-04-06 DIAGNOSIS — Z1152 Encounter for screening for COVID-19: Secondary | ICD-10-CM | POA: Diagnosis not present

## 2023-04-06 DIAGNOSIS — R112 Nausea with vomiting, unspecified: Secondary | ICD-10-CM | POA: Diagnosis present

## 2023-04-06 DIAGNOSIS — Z91040 Latex allergy status: Secondary | ICD-10-CM | POA: Insufficient documentation

## 2023-04-06 DIAGNOSIS — Z20822 Contact with and (suspected) exposure to covid-19: Secondary | ICD-10-CM | POA: Insufficient documentation

## 2023-04-06 LAB — CBC WITH DIFFERENTIAL/PLATELET
Abs Immature Granulocytes: 0.02 10*3/uL (ref 0.00–0.07)
Basophils Absolute: 0 10*3/uL (ref 0.0–0.1)
Basophils Relative: 0 %
Eosinophils Absolute: 0 10*3/uL (ref 0.0–0.5)
Eosinophils Relative: 0 %
HCT: 46.4 % — ABNORMAL HIGH (ref 36.0–46.0)
Hemoglobin: 15.7 g/dL — ABNORMAL HIGH (ref 12.0–15.0)
Immature Granulocytes: 0 %
Lymphocytes Relative: 39 %
Lymphs Abs: 2.7 10*3/uL (ref 0.7–4.0)
MCH: 30.7 pg (ref 26.0–34.0)
MCHC: 33.8 g/dL (ref 30.0–36.0)
MCV: 90.6 fL (ref 80.0–100.0)
Monocytes Absolute: 0.6 10*3/uL (ref 0.1–1.0)
Monocytes Relative: 9 %
Neutro Abs: 3.6 10*3/uL (ref 1.7–7.7)
Neutrophils Relative %: 52 %
Platelets: 222 10*3/uL (ref 150–400)
RBC: 5.12 MIL/uL — ABNORMAL HIGH (ref 3.87–5.11)
RDW: 13.8 % (ref 11.5–15.5)
WBC: 6.9 10*3/uL (ref 4.0–10.5)
nRBC: 0 % (ref 0.0–0.2)

## 2023-04-06 LAB — COMPREHENSIVE METABOLIC PANEL
ALT: 18 U/L (ref 0–44)
AST: 20 U/L (ref 15–41)
Albumin: 4.2 g/dL (ref 3.5–5.0)
Alkaline Phosphatase: 84 U/L (ref 38–126)
Anion gap: 14 (ref 5–15)
BUN: 23 mg/dL (ref 8–23)
CO2: 27 mmol/L (ref 22–32)
Calcium: 11.4 mg/dL — ABNORMAL HIGH (ref 8.9–10.3)
Chloride: 93 mmol/L — ABNORMAL LOW (ref 98–111)
Creatinine, Ser: 1.45 mg/dL — ABNORMAL HIGH (ref 0.44–1.00)
GFR, Estimated: 38 mL/min — ABNORMAL LOW (ref >60)
Glucose, Bld: 214 mg/dL — ABNORMAL HIGH (ref 70–99)
Potassium: 3.8 mmol/L (ref 3.5–5.1)
Sodium: 134 mmol/L — ABNORMAL LOW (ref 135–145)
Total Bilirubin: 0.8 mg/dL (ref <1.2)
Total Protein: 8.2 g/dL — ABNORMAL HIGH (ref 6.5–8.1)

## 2023-04-06 LAB — CBG MONITORING, ED: Glucose-Capillary: 231 mg/dL — ABNORMAL HIGH (ref 70–99)

## 2023-04-06 LAB — RESP PANEL BY RT-PCR (RSV, FLU A&B, COVID)  RVPGX2
Influenza A by PCR: NEGATIVE
Influenza B by PCR: NEGATIVE
Resp Syncytial Virus by PCR: NEGATIVE
SARS Coronavirus 2 by RT PCR: NEGATIVE

## 2023-04-06 LAB — LIPASE, BLOOD: Lipase: 27 U/L (ref 11–51)

## 2023-04-06 MED ORDER — SODIUM CHLORIDE 0.9 % IV BOLUS: 500.0000 mL | Freq: Once | INTRAVENOUS | Status: DC

## 2023-04-06 MED ORDER — ONDANSETRON HCL 4 MG/2ML IJ SOLN: 4.0000 mg | Freq: Once | INTRAMUSCULAR | Status: DC

## 2023-04-06 MED ORDER — METOCLOPRAMIDE HCL 10 MG PO TABS
10.0000 mg | ORAL_TABLET | Freq: Four times a day (QID) | ORAL | 0 refills | Status: DC
Start: 2023-04-06 — End: 2023-09-15

## 2023-04-06 MED ORDER — ONDANSETRON 4 MG PO TBDP
4.0000 mg | ORAL_TABLET | Freq: Once | ORAL | Status: DC
Start: 2023-04-06 — End: 2023-04-06

## 2023-04-06 NOTE — Discharge Instructions (Signed)
You were seen for your nausea and vomiting in the emergency department. You were found to have reduced kidney function and high calcium.   At home, please take the reglan we have prescribed you.    Check your MyChart online for the results of any tests that had not resulted by the time you left the emergency department.   Follow-up with your primary doctor in 2-3 days regarding your visit.    Return immediately to the emergency department if you experience any of the following: severe abdominal pain, or any other concerning symptoms.    Thank you for visiting our Emergency Department. It was a pleasure taking care of you today.

## 2023-04-06 NOTE — ED Notes (Signed)
IV access attempted multiple times, including USGPIV attempt, with no success.

## 2023-04-06 NOTE — ED Triage Notes (Signed)
Pt reports vomiting, cough, sore throat, fatigue and weakness x 2 weeks.

## 2023-04-06 NOTE — ED Provider Notes (Signed)
[facility] EMERGENCY DEPARTMENT AT [facility] Provider Note   CSN: [id number] Arrival date & time: 04/06/23  1605     History {Add pertinent medical, surgical, social history, OB history to HPI:1} Chief Complaint  Patient presents with   flu like symptoms    Katelyn Kaufman is a 72 y.o. female.  72 year old female with history of breast cancer currently being treated, CAD status post CABG, and chronic back pain previously on oxycodone who presents emergency department with nausea, vomiting, and cough.  Patient reports that 2 weeks ago she was changing her oxycodone to another medication but is unsure the name.  Was on oxycodone for years.  Says that she thinks shortly after that she started developing nausea and vomiting.  Says that anytime she tries to eat anything it comes back up.  Nonbloody nonbilious.  Has not had a bowel movement in 7 days.  Still passing gas.  Also has been having a runny nose.  Also has developed a low-grade fever of 41F and is having a nonproductive cough with congestion, and a sore throat.  No abdominal surgeries.  No alcohol or marijuana use.       Home Medications Prior to Admission medications   Medication Sig Start Date End Date Taking? Authorizing Provider  acetaminophen (TYLENOL) 325 MG tablet Take 2 tablets (650 mg total) by mouth every 6 (six) hours as needed for mild pain (or Fever >/= 101). 07/30/22   Shon Hale, MD  anastrozole (ARIMIDEX) 1 MG tablet Take 1 tablet (1 mg total) by mouth daily. TAKE 1 TABLET(1 MG) BY MOUTH DAILY 02/23/23   Doreatha Massed, MD  Ascorbic Acid (VITAMIN C) 1000 MG tablet Take 1,000 mg by mouth daily.    [provider]  aspirin EC 81 MG tablet Take 1 tablet (81 mg total) by mouth daily. Swallow whole. 11/23/21   Ardelle Balls, PA-C  baclofen (LIORESAL) 10 MG tablet Take 10 mg by mouth 2 (two) times daily as needed. 07/02/22   [provider]  cholecalciferol (VITAMIN D3) 25 MCG  (1000 UNIT) tablet Take 1,000 Units by mouth daily.    [provider]  citalopram (CELEXA) 20 MG tablet Take 1 tablet by mouth daily. 11/24/22 11/24/23  [provider]  clopidogrel (PLAVIX) 75 MG tablet TAKE 1 TABLET BY MOUTH EVERY DAY 07/19/22   Jonelle Sidle, MD  cyclobenzaprine (FLEXERIL) 10 MG tablet Take 10 mg by mouth 2 (two) times daily as needed for muscle spasms. 07/17/20   [provider]  DULoxetine (CYMBALTA) 60 MG capsule Take 60 mg by mouth daily.    [provider]  escitalopram (LEXAPRO) 10 MG tablet Take 10 mg by mouth daily. 11/24/22   [provider]  ezetimibe (ZETIA) 10 MG tablet Take 10 mg by mouth daily.    [provider]  furosemide (LASIX) 40 MG tablet TAKE 1 AND 1/2 TABLETS(60 MG) BY MOUTH DAILY 02/07/23   Jonelle Sidle, MD  glimepiride (AMARYL) 4 MG tablet Take 4 mg by mouth daily with breakfast.    [provider]  hydrOXYzine (ATARAX) 25 MG tablet Take 25 mg by mouth daily.    [provider]  isosorbide mononitrate (IMDUR) 30 MG 24 hr tablet Take 1 tablet (30 mg total) by mouth daily. 07/31/22   Shon Hale, MD  LANTUS SOLOSTAR 100 UNIT/ML Solostar Pen Inject 25 Units into the skin at bedtime. 07/30/22   Shon Hale, MD  losartan (COZAAR) 100 MG tablet  Take 1 tablet by mouth daily. 11/24/22 11/24/23  [provider]  lubiprostone (AMITIZA) 24 MCG capsule Take 24 mcg by mouth 2 (two) times daily. 07/02/22   [provider]  metFORMIN (GLUCOPHAGE) 500 MG tablet Take 500-1,000 mg by mouth See admin instructions. Take 2 tablets (1000 mg) in the morning and Take 1 tablets (500 mg) in the evening    [provider]  metoprolol tartrate (LOPRESSOR) 25 MG tablet Take 1 tablet (25 mg total) by mouth 2 (two) times daily. 11/23/21   Ardelle Balls, PA-C  Multiple Vitamin (MULTIVITAMIN) capsule Take 1 capsule by mouth daily.    [provider]  nicotine (NICODERM CQ  - DOSED IN MG/24 HOURS) 21 mg/24hr patch Place 1 patch (21 mg total) onto the skin daily. 07/31/22   Shon Hale, MD  nitroGLYCERIN (NITROSTAT) 0.4 MG SL tablet Place 1 tablet (0.4 mg total) under the tongue every 5 (five) minutes x 3 doses as needed for chest pain (if no relief after 3rd dose, proceed to ED or call 911). 07/08/22   Jonelle Sidle, MD  ondansetron (ZOFRAN-ODT) 4 MG disintegrating tablet Take 4 mg by mouth every 8 (eight) hours as needed for vomiting or nausea. For 7 days supply 07/14/22   [provider]  oxyCODONE-acetaminophen (PERCOCET) 10-325 MG tablet Take 1 tablet by mouth every 4 (four) hours as needed for pain. 07/30/22   Shon Hale, MD  pregabalin (LYRICA) 25 MG capsule Take 25 mg by mouth 2 (two) times daily.    [provider]  rosuvastatin (CRESTOR) 10 MG tablet Take 10 mg by mouth daily.    [provider]  vitamin B-12 (CYANOCOBALAMIN) 1000 MCG tablet Take 1,000 mcg by mouth daily.    [provider]      Allergies    Latex    Review of Systems   Review of Systems  Physical Exam Updated Vital Signs BP 119/82   Pulse 85   Temp 98.8 F (37.1 C) (Oral)   Resp (!) 21   Ht 5\' 4"  (1.626 m)   Wt 100.9 kg   SpO2 97%   BMI 38.17 kg/m  Physical Exam Vitals and nursing note reviewed.  Constitutional:      General: She is not in acute distress.    Appearance: She is well-developed.  HENT:     Head: Normocephalic and atraumatic.     Right Ear: External ear normal.     Left Ear: External ear normal.     Nose: Nose normal.  Eyes:     Extraocular Movements: Extraocular movements intact.     Conjunctiva/sclera: Conjunctivae normal.     Pupils: Pupils are equal, round, and reactive to light.  Cardiovascular:     Rate and Rhythm: Normal rate and regular rhythm.     Heart sounds: No murmur heard. Pulmonary:     Effort: Pulmonary effort is normal. No respiratory distress.     Breath sounds: Normal breath sounds.   Abdominal:     General: Abdomen is flat. There is no distension.     Palpations: Abdomen is soft. There is no mass.     Tenderness: There is abdominal tenderness (Diffuse). There is no guarding.  Musculoskeletal:     Cervical back: Normal range of motion and neck supple.     Right lower leg: No edema.     Left lower leg: No edema.  Skin:    General: Skin is warm and dry.  Neurological:  Mental Status: She is alert and oriented to person, place, and time. Mental status is at baseline.  Psychiatric:        Mood and Affect: Mood normal.     ED Results / Procedures / Treatments   Labs (all labs ordered are listed, but only abnormal results are displayed) Labs Reviewed  RESP PANEL BY RT-PCR (RSV, FLU A&B, COVID)  [id number]  COMPREHENSIVE METABOLIC PANEL  LIPASE, BLOOD  CBC WITH DIFFERENTIAL/PLATELET  URINALYSIS, ROUTINE W REFLEX MICROSCOPIC    EKG None  Radiology No results found.  Procedures Procedures  {Document cardiac monitor, telemetry assessment procedure when appropriate:1}  Medications Ordered in ED Medications  ondansetron (ZOFRAN) injection 4 mg (has no administration in time range)  sodium chloride 0.9 % bolus 500 mL (has no administration in time range)    ED Course/ Medical Decision Making/ A&P   {   Click here for ABCD2, HEART and other calculatorsREFRESH Note before signing :1}                              Medical Decision Making Amount and/or Complexity of Data Reviewed Labs: ordered. Radiology: ordered.  Risk Prescription drug management.   ***  {Document critical care time when appropriate:1} {Document review of labs and clinical decision tools ie heart score, Chads2Vasc2 etc:1}  {Document your independent review of radiology images, and any outside records:1} {Document your discussion with family members, caretakers, and with consultants:1} {Document social determinants of health affecting pt's care:1} {Document your decision making why  or why not admission, treatments were needed:1} Final Clinical Impression(s) / ED Diagnoses Final diagnoses:  None    Rx / DC Orders ED Discharge Orders     None

## 2023-04-06 NOTE — ED Notes (Signed)
This RN was informed by the patient that she wanted to leave AMA due to unsuccessful IV attempts and growing inpatient. Pt was educated on the importance of staying but her request was respected. EDP made aware of the patient wanting to leave. Pts sister notified that the patient would need a ride home.

## 2023-04-06 NOTE — ED Notes (Signed)
Pt seen walking out of the department. Declined signing AMA forms nor wanting AVS.

## 2023-05-31 ENCOUNTER — Encounter: Payer: Self-pay | Admitting: Neurology

## 2023-05-31 ENCOUNTER — Ambulatory Visit: Payer: Medicare HMO | Admitting: Neurology

## 2023-05-31 VITALS — BP 137/71 | HR 75 | Ht 64.0 in | Wt 217.0 lb

## 2023-05-31 DIAGNOSIS — M545 Low back pain, unspecified: Secondary | ICD-10-CM | POA: Insufficient documentation

## 2023-05-31 DIAGNOSIS — G8929 Other chronic pain: Secondary | ICD-10-CM

## 2023-05-31 DIAGNOSIS — R413 Other amnesia: Secondary | ICD-10-CM | POA: Diagnosis not present

## 2023-05-31 MED ORDER — PREGABALIN 50 MG PO CAPS: 50.0000 mg | ORAL_CAPSULE | Freq: Three times a day (TID) | ORAL | 5 refills | Status: DC | PRN

## 2023-05-31 MED ORDER — CELECOXIB 50 MG PO CAPS: 50.0000 mg | ORAL_CAPSULE | Freq: Two times a day (BID) | ORAL | 1 refills | Status: DC | PRN

## 2023-05-31 NOTE — Progress Notes (Signed)
 Chief Complaint  Patient presents with   New Patient (Initial Visit)    Rm 12. Accompanied by sister. NP/Paper/[facility] MD/dementia, tremor/sched w pt over the phone.      ASSESSMENT AND PLAN  Katelyn Kaufman is a 73 y.o. female   Mild cognitive impairment  Mini-Mental Status Examination was 23/30,  May indicate underlying central nervous system degenerative disorder, her mood disorder chronic pain certainly contributed to her difficulty too,  CT head from [facility] in September 2024 showed no acute abnormality, mild small vessel disease  Laboratory evaluation to rule out treatable etiology    Depression anxiety diffuse body achy pain, worsening low back pain,  Already on polypharmacy treatment, including Lyrica , Cymbalta , Celexa, will try higher dose of Lyrica  50 mg 3 times a day,Celexa 15 mg twice daily as needed,  Refer to pain management Dr. Georgeana   DIAGNOSTIC DATA (LABS, IMAGING, TESTING) - I reviewed patient records, labs, notes, testing and imaging myself where available.   MEDICAL HISTORY:  Katelyn Kaufman 73 year old female, accompanied by her sister, seen in request by her primary care doctor Verdie Scull for evaluation of memory loss, diffuse body achy pain, depression anxiety   History is obtained from the patient and review of electronic medical records. I personally reviewed pertinent available imaging films in PACS.   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, also had incision and debridement of left mastectomy wound in March 2024,  She now complains of depression anxiety, not feeling well, could not sleep, memory loss, Mini-Mental Status Examination 23/30, denied family history of dementia,  CT head in September 2024, no acute abnormality, mild small vessel disease,  MRI lumbar in May 2023, multilevel disc facet degeneration, moderate severe stenosis at L2-3, moderate at L3-4, variable degree of foraminal narrowing,   PHYSICAL EXAM:   Vitals:   05/31/23 1514  BP: 137/71  Pulse: 75  Weight: 217 lb (98.4 kg)  Height: 5' 4 (1.626 m)   Body mass index is 37.25 kg/m.  PHYSICAL EXAMNIATION:  Gen: NAD, conversant, well nourised, well groomed                     Cardiovascular: Regular rate rhythm, no peripheral edema, warm, nontender. Eyes: Conjunctivae clear without exudates or hemorrhage Neck: Supple, no carotid bruits. Pulmonary: Clear to auscultation bilaterally   NEUROLOGICAL EXAM:  MENTAL STATUS: Speech/cognition: Depressed looking elderly female, oriented to history taking and casual conversation    05/31/2023    3:17 PM  MMSE - Mini Mental State Exam  Orientation to time 5  Orientation to Place 4  Registration 3  Attention/ Calculation 1  Recall 2  Language- name 2 objects 2  Language- repeat 1  Language- follow 3 step command 3  Language- read & follow direction 1  Write a sentence 1  Copy design 0  Total score 23    CRANIAL NERVES: CN II: Visual fields are full to confrontation. Pupils  are round equal and briskly reactive to light. CN III, IV, VI: extraocular movement are normal. No ptosis. CN V: Facial sensation is intact to light touch CN VII: Face is symmetric with normal eye closure  CN VIII: Hearing is normal to causal conversation. CN IX, X: Phonation is normal. CN XI: Head turning and shoulder shrug are intact  MOTOR: There is no pronator drift of  out-stretched arms. Muscle bulk and tone are normal. Muscle strength is normal.  REFLEXES: Reflexes are 1 and symmetric at the biceps, triceps, knees, and absent ankles. Plantar responses are flexor.  SENSORY: Length-dependent decreased light touch, pinprick to mid shin level  COORDINATION: There is no trunk or limb dysmetria noted.  GAIT/STANCE: Push-up to get up from seated position, cautious, antalgic  REVIEW OF SYSTEMS:  Full 14 system review of systems performed and notable only for as above All other review of systems were negative.   ALLERGIES: Allergies  Allergen Reactions   Latex Rash    HOME MEDICATIONS: Current Outpatient Medications  Medication Sig Dispense Refill   acetaminophen  (TYLENOL ) 325 MG tablet Take 2 tablets (650 mg total) by mouth every 6 (six) hours as needed for mild pain (or Fever >/= 101). 100 tablet 2   anastrozole  (ARIMIDEX ) 1 MG tablet Take 1 tablet (1 mg total) by mouth daily. TAKE 1 TABLET(1 MG) BY MOUTH DAILY 90 tablet 3   Ascorbic Acid  (VITAMIN C ) 1000 MG tablet Take 1,000 mg by mouth daily.     aspirin  EC 81 MG tablet Take 1 tablet (81 mg total) by mouth daily. Swallow whole. 30 tablet 12   baclofen (LIORESAL) 10 MG tablet Take 10 mg by mouth 2 (two) times daily as needed.     cholecalciferol  (VITAMIN D3) 25 MCG (1000 UNIT) tablet Take 1,000 Units by mouth daily.     citalopram (CELEXA) 20 MG tablet Take 1 tablet by mouth daily.     clopidogrel  (PLAVIX ) 75 MG tablet TAKE 1 TABLET BY MOUTH EVERY DAY 90 tablet 1   cyclobenzaprine  (FLEXERIL ) 10 MG tablet Take 10 mg by mouth 2 (two) times daily as needed for muscle spasms.     DULoxetine  (CYMBALTA ) 60 MG capsule Take 60 mg by mouth daily.     escitalopram (LEXAPRO) 10 MG tablet Take 10 mg by mouth daily.     ezetimibe  (ZETIA ) 10 MG tablet Take 10 mg by mouth daily.     furosemide  (LASIX ) 40 MG tablet TAKE 1 AND 1/2 TABLETS(60 MG) BY MOUTH DAILY 135 tablet 2   glimepiride  (AMARYL ) 4 MG tablet Take  4 mg by mouth daily with breakfast.     hydrOXYzine  (ATARAX ) 25 MG tablet Take 25 mg by mouth daily.     isosorbide  mononitrate (IMDUR ) 30 MG 24 hr tablet Take 1 tablet (30 mg total) by mouth daily. 30 tablet 2   LANTUS  SOLOSTAR 100 UNIT/ML Solostar Pen Inject 25 Units into the skin at bedtime. 15 mL 11   losartan  (COZAAR ) 100 MG tablet Take 1 tablet by mouth daily.     lubiprostone (AMITIZA) 24 MCG capsule Take 24 mcg by mouth 2 (two) times daily.     metFORMIN  (GLUCOPHAGE ) 500 MG tablet Take 500-1,000 mg by mouth See admin instructions. Take 2 tablets (1000 mg) in the morning and Take 1 tablets (500 mg) in the evening     metoCLOPramide  (REGLAN ) 10 MG tablet Take 1 tablet (10 mg total) by mouth every 6 (six) hours. 12 tablet 0   metoprolol  tartrate (  LOPRESSOR ) 25 MG tablet Take 1 tablet (25 mg total) by mouth 2 (two) times daily. 60 tablet 1   Multiple Vitamin (MULTIVITAMIN) capsule Take 1 capsule by mouth daily.     ondansetron  (ZOFRAN -ODT) 4 MG disintegrating tablet Take 4 mg by mouth every 8 (eight) hours as needed for vomiting or nausea. For 7 days supply     oxyCODONE -acetaminophen  (PERCOCET) 10-325 MG tablet Take 1 tablet by mouth every 4 (four) hours as needed for pain. 15 tablet 0   pregabalin  (LYRICA ) 25 MG capsule Take 25 mg by mouth 2 (two) times daily.     rosuvastatin  (CRESTOR ) 10 MG tablet Take 10 mg by mouth daily.     vitamin B-12 (CYANOCOBALAMIN ) 1000 MCG tablet Take 1,000 mcg by mouth daily.     nitroGLYCERIN  (NITROSTAT ) 0.4 MG SL tablet Place 1 tablet (0.4 mg total) under the tongue every 5 (five) minutes x 3 doses as needed for chest pain (if no relief after 3rd dose, proceed to ED or call 911). (Patient not taking: Reported on 05/31/2023) 75 tablet 1   No current facility-administered medications for this visit.    PAST MEDICAL HISTORY: Past Medical History:  Diagnosis Date   Anxiety    Arthritis    CAD (coronary artery disease) 2018   Severe trifurcation disease  involving left main, ostial circumflex, 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   COPD (chronic obstructive pulmonary disease) (HCC)    Depression    Essential hypertension    Hyperlipidemia    Myocardial infarction (HCC)    [place] to the LAD   OSA (obstructive sleep apnea)    CPAP qHS   PONV (postoperative nausea and vomiting)    Type 2 diabetes mellitus (HCC)     PAST SURGICAL HISTORY: Past Surgical History:  Procedure Laterality Date   BACK SURGERY     BREAST SURGERY     CATARACT EXTRACTION W/PHACO Right 10/24/2020   Procedure: CATARACT EXTRACTION PHACO AND INTRAOCULAR LENS PLACEMENT (IOC);  Surgeon: Harrie Agent, MD;  Location: [facility];  Service: Ophthalmology;  Laterality: Right;  CDE: 10.87   CATARACT EXTRACTION W/PHACO Left 12/01/2020   Procedure: CATARACT EXTRACTION PHACO AND INTRAOCULAR LENS PLACEMENT LEFT EYE;  Surgeon: Harrie Agent, MD;  Location: [facility];  Service: Ophthalmology;  Laterality: Left;  CDE  8.62   CORONARY ARTERY BYPASS GRAFT N/A 11/16/2021   Procedure: CORONARY ARTERY BYPASS GRAFTING (CABG) times three using the left internal mammary and right saphenous vein.;  Surgeon: Lucas Dorise POUR, MD;  Location: [facility] OR;  Service: Open Heart Surgery;  Laterality: N/A;   IABP INSERTION N/A 11/16/2021   Procedure: IABP Insertion;  Surgeon: Anner Alm ORN, MD;  Location: [facility] INVASIVE CV LAB;  Service: Cardiovascular;  Laterality: N/A;   LEFT HEART CATH AND CORONARY ANGIOGRAPHY N/A 11/16/2021   Procedure: LEFT HEART CATH AND CORONARY ANGIOGRAPHY;  Surgeon: Anner Alm ORN, MD;  Location: [facility] INVASIVE CV LAB;  Service: Cardiovascular;  Laterality: N/A;   REPLACEMENT TOTAL KNEE Right    SIMPLE MASTECTOMY WITH AXILLARY SENTINEL NODE BIOPSY Left 06/25/2022   Procedure: SIMPLE MASTECTOMY;  Surgeon: Mavis Anes, MD;  Location: [facility];  Service: General;  Laterality: Left;   TEE WITHOUT CARDIOVERSION N/A  11/16/2021   Procedure: TRANSESOPHAGEAL ECHOCARDIOGRAM (TEE);  Surgeon: Lucas Dorise POUR, MD;  Location: [facility] OR;  Service: Open Heart Surgery;  Laterality: N/A;   TUBAL LIGATION  WOUND EXPLORATION Left 07/26/2022   Procedure: EVACUATION OF HEMATOMA BREAST, DEBRIDEMENT OF LEFT BREAST WOUND;  Surgeon: Mavis Anes, MD;  Location: [facility];  Service: General;  Laterality: Left;    FAMILY HISTORY: Family History  Problem Relation Age of Onset   COPD Mother    Alcohol abuse Father    Hypertension Sister    Heart attack Brother     SOCIAL HISTORY: Social History   Socioeconomic History   Marital status: Widowed    Spouse name: Not on file   Number of children: Not on file   Years of education: Not on file   Highest education level: Not on file  Occupational History   Not on file  Tobacco Use   Smoking status: Every Day    Current packs/day: 0.50    Average packs/day: 0.5 packs/day for 40.0 years (20.0 ttl pk-yrs)    Types: Cigarettes   Smokeless tobacco: Never  Vaping Use   Vaping status: Never Used  Substance and Sexual Activity   Alcohol use: Never   Drug use: Never   Sexual activity: Not Currently  Other Topics Concern   Not on file  Social History Narrative   Not on file   Social Drivers of Health   Financial Resource Strain: Low Risk  (02/10/2023)   Received from [facility]   Overall Financial Resource Strain (CARDIA)    Difficulty of Paying Living Expenses: Not hard at all  Recent Concern: Financial Resource Strain - Medium Risk (11/24/2022)   Received from [facility]   Overall Financial Resource Strain (CARDIA)    Difficulty of Paying Living Expenses: Somewhat hard  Food Insecurity: No Food Insecurity (02/10/2023)   Received from [facility]   Hunger Vital Sign    Worried About Running Out of Food in the Last Year: Never true    Ran Out of Food in the Last Year: Never true  Recent Concern: Food Insecurity - Food Insecurity Present (11/24/2022)    Received from [facility]   Hunger Vital Sign    Worried About Running Out of Food in the Last Year: Sometimes true    Ran Out of Food in the Last Year: Sometimes true  Transportation Needs: Unmet Transportation Needs (12/30/2022)   Received from [facility]   PRAPARE - Transportation    Lack of Transportation (Medical): Yes    Lack of Transportation (Non-Medical): Yes  Physical Activity: Insufficiently Active (12/30/2022)   Received from [facility]   Exercise Vital Sign    Days of Exercise per Week: 4 days    Minutes of Exercise per Session: 30 min  Stress: Stress Concern Present (02/10/2023)   Received from [facility] - Occupational Stress Questionnaire    Feeling of Stress : To some extent  Social Connections: Unknown (02/10/2023)   Received from [facility]   Social Connection and Isolation Panel [NHANES]    Frequency of Communication with Friends and Family: More than three times a week    Frequency of Social Gatherings with Friends and Family: Three times a week    Attends Religious Services: 1 to 4 times per year    Active Member of Clubs or Organizations: No    Attends Banker Meetings: More than 4 times per year    Marital Status: Not on file  Intimate Partner Violence: Not At Risk (02/10/2023)   Received from [facility]  Humiliation, Afraid, Rape, and Kick questionnaire    Fear of Current or Ex-Partner: No    Emotionally Abused: No    Physically Abused: No    Sexually Abused: No      Modena Callander, M.D. Ph.D.  [facility] [address], [address] [place], [place] [address] Ph: [phone] Fax: [phone]  CC:  Verdie Johann LABOR, DO [address] [place],  [place] [address]  Verdie Johann A, DO

## 2023-06-01 ENCOUNTER — Ambulatory Visit: Payer: Medicare HMO | Admitting: Neurology

## 2023-06-01 LAB — RPR: RPR Ser Ql: NONREACTIVE

## 2023-06-01 LAB — TSH: TSH: 1.47 u[IU]/mL (ref 0.450–4.500)

## 2023-06-01 LAB — VITAMIN B12: Vitamin B-12: 1408 pg/mL — ABNORMAL HIGH (ref 232–1245)

## 2023-06-06 ENCOUNTER — Other Ambulatory Visit: Payer: Self-pay | Admitting: Hematology

## 2023-06-06 ENCOUNTER — Telehealth: Payer: Self-pay | Admitting: Neurology

## 2023-06-06 NOTE — Telephone Encounter (Signed)
Referral for pain clinic fax to [facility]. Phone: [phone], Fax: [phone]

## 2023-06-10 ENCOUNTER — Ambulatory Visit (HOSPITAL_COMMUNITY): Payer: Medicare HMO

## 2023-06-10 NOTE — Therapy (Deleted)
[facility] OUTPATIENT PHYSICAL THERAPY EVALUATION (THORACOLUMBAR)   Patient Name: Katelyn Kaufman MRN: [id number] DOB:02-Nov-1950, 73 y.o., female Today's Date: 06/10/2023  END OF SESSION: ***    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   COPD (chronic obstructive pulmonary disease) (HCC)    Depression    Essential hypertension    Hyperlipidemia    Myocardial infarction (HCC)    [place] to the LAD   OSA (obstructive sleep apnea)    CPAP qHS   PONV (postoperative nausea and vomiting)    Type 2 diabetes mellitus (HCC)    Past Surgical History:  Procedure Laterality Date   BACK SURGERY     BREAST SURGERY     CATARACT EXTRACTION W/PHACO Right 10/24/2020   Procedure: CATARACT EXTRACTION PHACO AND INTRAOCULAR LENS PLACEMENT (IOC);  Surgeon: Fabio Pierce, MD;  Location: [facility];  Service: Ophthalmology;  Laterality: Right;  CDE: 10.87   CATARACT EXTRACTION W/PHACO Left 12/01/2020   Procedure: CATARACT EXTRACTION PHACO AND INTRAOCULAR LENS PLACEMENT LEFT EYE;  Surgeon: Fabio Pierce, MD;  Location: [facility];  Service: Ophthalmology;  Laterality: Left;  CDE  8.62   CORONARY ARTERY BYPASS GRAFT N/A 11/16/2021   Procedure: CORONARY ARTERY BYPASS GRAFTING (CABG) times three using the left internal mammary and right saphenous vein.;  Surgeon: Alleen Borne, MD;  Location: [facility] OR;  Service: Open Heart Surgery;  Laterality: N/A;   IABP INSERTION N/A 11/16/2021   Procedure: IABP Insertion;  Surgeon: Marykay Lex, MD;  Location: [facility] INVASIVE CV LAB;  Service: Cardiovascular;  Laterality: N/A;   LEFT HEART CATH AND CORONARY ANGIOGRAPHY N/A 11/16/2021   Procedure: LEFT HEART CATH AND CORONARY ANGIOGRAPHY;  Surgeon: Marykay Lex, MD;  Location: [facility] INVASIVE CV LAB;   Service: Cardiovascular;  Laterality: N/A;   REPLACEMENT TOTAL KNEE Right    SIMPLE MASTECTOMY WITH AXILLARY SENTINEL NODE BIOPSY Left 06/25/2022   Procedure: SIMPLE MASTECTOMY;  Surgeon: Franky Macho, MD;  Location: [facility];  Service: General;  Laterality: Left;   TEE WITHOUT CARDIOVERSION N/A 11/16/2021   Procedure: TRANSESOPHAGEAL ECHOCARDIOGRAM (TEE);  Surgeon: Alleen Borne, MD;  Location: [facility] OR;  Service: Open Heart Surgery;  Laterality: N/A;   TUBAL LIGATION     WOUND EXPLORATION Left 07/26/2022   Procedure: EVACUATION OF HEMATOMA BREAST, DEBRIDEMENT OF LEFT BREAST WOUND;  Surgeon: Franky Macho, MD;  Location: [facility];  Service: General;  Laterality: Left;   Patient Active Problem List   Diagnosis Date Noted   Memory loss 05/31/2023   Chronic low back pain 05/31/2023   Breast abscess 08/17/2022   Medication management 08/17/2022   PICC (peripherally inserted central catheter) in place 08/17/2022   Hematoma of left breast 07/26/2022   Abscess after procedure 07/25/2022   Tobacco use disorder 07/25/2022   Thyromegaly 07/25/2022   S/P left mastectomy 06/25/2022   Breast cancer of upper-outer quadrant of left female breast (HCC) 12/10/2021   Acute coronary syndrome (HCC)    Type 2 diabetes mellitus with complication, with long-term current use of insulin (HCC) 11/16/2021   Hypertension 11/16/2021   Hyperlipidemia 11/16/2021   Morbid obesity (HCC) 11/16/2021   Acute myocardial infarction involving left main coronary artery (HCC) 11/16/2021   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  Coronary artery disease 11/16/2021   Non-ST elevation (NSTEMI) myocardial infarction [facility])     PCP: Lindaann Slough, DORef Provider (PCP)   REFERRING PROVIDER:   Levert Feinstein, MD    REFERRING DIAG:  Diagnosis  R41.3 (ICD-10-CM) - Memory loss  M54.50,G89.29 (ICD-10-CM) - Chronic low back pain, unspecified back pain laterality, unspecified whether sciatica present    Rationale for Evaluation and Treatment:  {HABREHAB:27488}  THERAPY DIAG:  No diagnosis found.  ONSET DATE: ***    SUBJECTIVE:                                                                                                                                                                                           SUBJECTIVE STATEMENT: ***  PERTINENT HISTORY:  ***  PAIN:  Are you having pain? {OPRCPAIN:27236}  PRECAUTIONS: {Therapy precautions:24002}  RED FLAGS: {PT Red Flags:29287}   WEIGHT BEARING RESTRICTIONS: {Yes ***/No:24003}  FALLS:  Has patient fallen in last 6 months? {fallsyesno:27318}  LIVING ENVIRONMENT: Lives with: {OPRC lives with:25569::"lives with their family"} Lives in: {Lives in:25570} Stairs: {opstairs:27293} Has following equipment at home: {Assistive devices:23999} How many hours do you sleep every night: *** How many minutes of moderate exercise do you get weekly: ***  OCCUPATION: ***  PLOF: {PLOF:24004}   PATIENT GOALS: ***  NEXT MD VISIT: ***    OBJECTIVE:   DIAGNOSTIC FINDINGS:  ***  PATIENT SURVEYS:  {rehab surveys:24030}  SCREENING FOR RED FLAGS: Bowel or bladder incontinence: {Yes/No:304960894} Spinal tumors: {Yes/No:304960894} Cauda equina syndrome: {Yes/No:304960894} Compression fracture: {Yes/No:304960894} Abdominal aneurysm: {Yes/No:304960894}  COGNITION: Overall cognitive status: {cognition:24006}  POSTURE: {posture:25561}      FUNCTIONAL TESTS:  {Functional tests:24029}   GAIT ANALYSIS: Distance walked: *** Assistive device utilized: {Assistive devices:23999} Level of assistance: {Levels of assistance:24026} Comments: ***  SENSATION: {sensation:27233}   LUMBAR ROM:   AROM eval  Flexion   Extension   Right lateral flexion   Left lateral flexion   Right rotation   Left rotation    (Blank rows = not tested; * = limited by pain)  LOWER EXTREMITY MMT:    Left ankle/hip/knee grossly ***/5 except:  Right ankle/hip/knee grossly ***/5  except:   LOWER EXTREMITY ROM:     Left ankle/hip/knee AROM grossly WFL except:  Right ankle/hip/knee AROM grossly WFL except:   LUMBAR SPECIAL TESTS:  {lumbar special test:25242} {LEspecialtests:26242}  PALPATION: ***    TODAY'S TREATMENT:  DATE: ***   PATIENT EDUCATION:  Education details: *** Person educated: {Person educated:25204} Education method: {Education Method:25205} Education comprehension: {Education Comprehension:25206}  HOME EXERCISE PROGRAM: ***    ASSESSMENT:  CLINICAL IMPRESSION: Patient is a 73 y.o. y.o. female who was seen today for physical therapy evaluation and treatment for ***. Patient presents to PT with the following objective impairments: {opptimpairments:25111}. These impairments limit the patient in activities such as {activitylimitations:27494}. These impairments also limit the patient in participation such as {participationrestrictions:25113}. The patient will benefit from PT to address the limitations/impairments listed below to return to their prior level of function in the domains of activity and participation.    PERSONAL FACTORS: {Personal factors:25162} are also affecting patient's functional outcome.   REHAB POTENTIAL: {rehabpotential:25112}  CLINICAL DECISION MAKING: {clinical decision making:25114}  EVALUATION COMPLEXITY: {Evaluation complexity:25115}    GOALS: Goals reviewed with patient? {yes/no:20286}  SHORT TERM GOALS: Target date: ***  Patient will be able to score a *** on the {rehab surveys:24030} to demonstrate an improvement in overall housework, ADL completion, mobility, and self-care. Baseline: Goal status: INITIAL  2.  Patient will complete the {Functional tests:24029} within ***  to demonstrate an improvement lower extremity strength needed for home and community ambulation  Baseline:   Goal status: INITIAL  3. Patient will be independent with a basic stretching/strengthening HEP  Baseline:  Goal status: INITIAL   LONG TERM GOALS: Target date: ***  Patient will be able to score a *** on the {rehab surveys:24030} to demonstrate an improvement in overall housework, ADL completion, mobility, and self-care.Baseline:  Goal status: INITIAL  2.   Patient will complete the {Functional tests:24029} within ***  to demonstrate an improvement lower extremity strength needed for home and community ambulation  Baseline:  Goal status: INITIAL  3.  Patient will be independent with a comprehensive strengthening HEP  Baseline:  Goal status: INITIAL    PLAN:  PT FREQUENCY: {rehab frequency:25116}  PT DURATION: {rehab duration:25117}  PLANNED INTERVENTIONS: 97110-Therapeutic exercises, 97530- Therapeutic activity, 97112- Neuromuscular re-education, 97535- Self Care, 16073- Manual therapy, [phone]- Gait training, Patient/Family education, Balance training, Stair training, Dry Needling, Joint mobilization, Joint manipulation, Spinal manipulation, Spinal mobilization, Vestibular training, Cognitive remediation, Cryotherapy, and Moist heat.  PLAN FOR NEXT SESSION: ***   [employer] Auth Request  Referring diagnosis code (ICD 10)?  Diagnosis  R41.3 (ICD-10-CM) - Memory loss  M54.50,G89.29 (ICD-10-CM) - Chronic low back pain, unspecified back pain laterality, unspecified whether sciatica present   Treatment diagnosis codes (ICD 10)? (if different than referring diagnosis) *** What was this (referring dx) caused by? []  Surgery []  Fall []  Ongoing issue []  Arthritis []  Other: ____________  Laterality: []  Rt []  Lt []  Both  Deficits: []  Pain []  Stiffness []  Weakness []  Edema []  Balance Deficits []  Coordination []  Gait Disturbance []  ROM []  Other   Functional Tool Score: ***  CPT codes: See Planned Interventions listed in the Plan section of the Evaluation.     Seymour Bars, PT 06/10/2023, 9:05 AM

## 2023-07-15 ENCOUNTER — Other Ambulatory Visit: Payer: Self-pay | Admitting: Cardiology

## 2023-08-23 ENCOUNTER — Inpatient Hospital Stay: Payer: Medicare (Managed Care) | Attending: Hematology

## 2023-08-23 DIAGNOSIS — Z1732 Human epidermal growth factor receptor 2 negative status: Secondary | ICD-10-CM | POA: Diagnosis not present

## 2023-08-23 DIAGNOSIS — C50412 Malignant neoplasm of upper-outer quadrant of left female breast: Secondary | ICD-10-CM | POA: Diagnosis present

## 2023-08-23 DIAGNOSIS — M8580 Other specified disorders of bone density and structure, unspecified site: Secondary | ICD-10-CM | POA: Insufficient documentation

## 2023-08-23 DIAGNOSIS — Z79811 Long term (current) use of aromatase inhibitors: Secondary | ICD-10-CM | POA: Insufficient documentation

## 2023-08-23 DIAGNOSIS — Z170 Estrogen receptor positive status [ER+]: Secondary | ICD-10-CM | POA: Insufficient documentation

## 2023-08-23 DIAGNOSIS — N189 Chronic kidney disease, unspecified: Secondary | ICD-10-CM | POA: Insufficient documentation

## 2023-08-23 DIAGNOSIS — Z9012 Acquired absence of left breast and nipple: Secondary | ICD-10-CM | POA: Insufficient documentation

## 2023-08-23 DIAGNOSIS — Z1721 Progesterone receptor positive status: Secondary | ICD-10-CM | POA: Diagnosis not present

## 2023-08-23 DIAGNOSIS — R7989 Other specified abnormal findings of blood chemistry: Secondary | ICD-10-CM | POA: Insufficient documentation

## 2023-08-23 DIAGNOSIS — F17210 Nicotine dependence, cigarettes, uncomplicated: Secondary | ICD-10-CM | POA: Diagnosis not present

## 2023-08-23 LAB — COMPREHENSIVE METABOLIC PANEL WITH GFR
ALT: 13 U/L (ref 0–44)
AST: 19 U/L (ref 15–41)
Albumin: 3.6 g/dL (ref 3.5–5.0)
Alkaline Phosphatase: 52 U/L (ref 38–126)
Anion gap: 9 (ref 5–15)
BUN: 22 mg/dL (ref 8–23)
CO2: 27 mmol/L (ref 22–32)
Calcium: 10.2 mg/dL (ref 8.9–10.3)
Chloride: 98 mmol/L (ref 98–111)
Creatinine, Ser: 1.54 mg/dL — ABNORMAL HIGH (ref 0.44–1.00)
GFR, Estimated: 36 mL/min — ABNORMAL LOW (ref >60)
Glucose, Bld: 207 mg/dL — ABNORMAL HIGH (ref 70–99)
Potassium: 4.1 mmol/L (ref 3.5–5.1)
Sodium: 134 mmol/L — ABNORMAL LOW (ref 135–145)
Total Bilirubin: 0.5 mg/dL (ref 0.0–1.2)
Total Protein: 6.9 g/dL (ref 6.5–8.1)

## 2023-08-23 LAB — CBC WITH DIFFERENTIAL/PLATELET
Abs Immature Granulocytes: 0.01 10*3/uL (ref 0.00–0.07)
Basophils Absolute: 0 10*3/uL (ref 0.0–0.1)
Basophils Relative: 1 %
Eosinophils Absolute: 0.1 10*3/uL (ref 0.0–0.5)
Eosinophils Relative: 1 %
HCT: 39.3 % (ref 36.0–46.0)
Hemoglobin: 12.9 g/dL (ref 12.0–15.0)
Immature Granulocytes: 0 %
Lymphocytes Relative: 48 %
Lymphs Abs: 3.1 10*3/uL (ref 0.7–4.0)
MCH: 31.3 pg (ref 26.0–34.0)
MCHC: 32.8 g/dL (ref 30.0–36.0)
MCV: 95.4 fL (ref 80.0–100.0)
Monocytes Absolute: 0.6 10*3/uL (ref 0.1–1.0)
Monocytes Relative: 10 %
Neutro Abs: 2.5 10*3/uL (ref 1.7–7.7)
Neutrophils Relative %: 40 %
Platelets: 254 10*3/uL (ref 150–400)
RBC: 4.12 MIL/uL (ref 3.87–5.11)
RDW: 13.8 % (ref 11.5–15.5)
WBC: 6.3 10*3/uL (ref 4.0–10.5)
nRBC: 0 % (ref 0.0–0.2)

## 2023-08-23 LAB — VITAMIN D 25 HYDROXY (VIT D DEFICIENCY, FRACTURES): Vit D, 25-Hydroxy: 53.24 ng/mL (ref 30–100)

## 2023-08-24 ENCOUNTER — Inpatient Hospital Stay: Payer: Medicare (Managed Care)

## 2023-08-30 NOTE — Progress Notes (Signed)
 [facility] [address] [place] [address]    Clinic Day:  08/31/2023  Referring physician: Catalina Lunger, DO  Patient Care Team: Lindaann Slough, DO as PCP - General (Family Medicine) Jonelle Sidle, MD as PCP - Cardiology (Cardiology) Doreatha Massed, MD as Medical Oncologist (Medical Oncology) Therese Sarah, RN as Oncology Nurse Navigator (Medical Oncology)   ASSESSMENT & PLAN:   Assessment: 1. Stage II (T3N0G2) left breast cancer: - Abnormal screening mammogram on 10/14/2021, followed by diagnostic mammogram on 10/27/2021 - Left breast ultrasound (11/16/2021): Irregular mass containing calcifications in the left breast at 2 o'clock position 2 cm from nipple measuring 1.7 x 0.6 x 1 cm.  In addition there are several similar-appearing masses extending from this mass towards the nipple with a mass in the left breast 2:00 retroareolar measuring 0.4 x 0.4 x 0.4 cm.  Together these masses span a distance of approximately 2.7 cm.  No lymphadenopathy in the axilla. 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 - She will underwent CABG x3 on 11/16/2021 - 06/25/2022: Left simple mastectomy - Pathology: 5.5 cm infiltrating ductal carcinoma, grade 2, margins negative, PT3 PNX - 07/26/2022: Incision and debridement of left mastectomy wound - Anastrozole started on 08/03/2022   2. Social/family history: - She lives at home with roommate.  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  Current smoker, half pack per day for 40 years. - Brother had cancer.    Plan: 1. Stage II (T3N0G2) left breast cancer, ER/PR +, HER2-: - She is tolerating anastrozole reasonably well.  She has hot flashes predominantly at nighttime. - At last visit we gave her low-dose gabapentin.  Apparently it was discontinued  by her PMD. - She reports that she cannot tolerate the hot flashes. - Physical exam: Left breast lumpectomy site is within normal limits with tenderness which is stable.  No palpable adenopathy. - Labs reviewed shows worsening creatinine of 1.54.  LFTs are normal.  CBC grossly normal. - Will make referral to Dr. Wolfgang Phoenix for CKD. - Will arrange for right breast mammogram in June this year. - Continue anastrozole.  RTC 6 months for follow-up.  2.  Osteopenia (DEXA 12/18/2021 T-score -1.2): - Vitamin D is 53.2.  Calcium is 10.2.    Orders Placed This Encounter  Procedures   MM 3D SCREENING MAMMOGRAM UNILATERAL RIGHT BREAST    Per office: No implants NAS No devices    Standing Status:   Future    Expected Date:   11/08/2023    Expiration Date:   08/30/2024    Reason for Exam (SYMPTOM  OR DIAGNOSIS REQUIRED):   breast cancer screening    Preferred imaging location?:   [facility]      I,Helena R Teague,acting as a scribe for Doreatha Massed, MD.,have documented all relevant documentation on the behalf of Doreatha Massed, MD,as directed by  Doreatha Massed, MD while in the presence of Doreatha Massed, MD.  I, Doreatha Massed MD, have reviewed the above documentation for accuracy and completeness, and I agree with the above.    Doreatha Massed, MD   4/9/20253:51 PM  CHIEF COMPLAINT:   Diagnosis: left breast cancer    Cancer Staging  Breast cancer of upper-outer quadrant of left female breast [facility]) Staging form: Breast, AJCC  8th Edition - Clinical stage from 12/10/2021: Stage IIA (cT3, cN0, cM0, G2, ER+, PR+, HER2-) - Signed by Doreatha Massed, MD on 08/03/2022    Prior Therapy: left mastectomy 06/25/22   Current Therapy:  anastrozole    HISTORY OF PRESENT ILLNESS:   Oncology History  Breast cancer of upper-outer quadrant of left female breast (HCC)  12/10/2021 Initial Diagnosis   Breast cancer of upper-outer quadrant of left female breast  (HCC)   12/10/2021 Cancer Staging   Staging form: Breast, AJCC 8th Edition - Clinical stage from 12/10/2021: Stage IIA (cT3, cN0, cM0, G2, ER+, PR+, HER2-) - Signed by Doreatha Massed, MD on 08/03/2022 Histopathologic type: Infiltrating duct carcinoma, NOS Stage prefix: Initial diagnosis Histologic grading system: 3 grade system      INTERVAL HISTORY:   Katelyn Kaufman is a 73 y.o. female presenting to clinic today for follow up of left breast cancer. She was last seen by me on 02/23/23.  Today, she states that she is doing well overall. Her appetite level is at 75%. Her energy level is at 10%.  PAST MEDICAL HISTORY:   Past Medical History: 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   COPD (chronic obstructive pulmonary disease) (HCC)    Depression    Essential hypertension    Hyperlipidemia    Myocardial infarction (HCC)    [place] to the LAD   OSA (obstructive sleep apnea)    CPAP qHS   PONV (postoperative nausea and vomiting)    Type 2 diabetes mellitus (HCC)     Surgical History: Past Surgical History:  Procedure Laterality Date   BACK SURGERY     BREAST SURGERY     CATARACT EXTRACTION W/PHACO Right 10/24/2020   Procedure: CATARACT EXTRACTION PHACO AND INTRAOCULAR LENS PLACEMENT (IOC);  Surgeon: Fabio Pierce, MD;  Location: [facility];  Service: Ophthalmology;  Laterality: Right;  CDE: 10.87   CATARACT EXTRACTION W/PHACO Left 12/01/2020   Procedure: CATARACT EXTRACTION PHACO AND INTRAOCULAR LENS PLACEMENT LEFT EYE;  Surgeon: Fabio Pierce, MD;  Location: [facility];  Service: Ophthalmology;  Laterality: Left;  CDE  8.62   CORONARY ARTERY BYPASS GRAFT N/A 11/16/2021   Procedure: CORONARY ARTERY BYPASS GRAFTING (CABG) times three using the left  internal mammary and right saphenous vein.;  Surgeon: Alleen Borne, MD;  Location: [facility] OR;  Service: Open Heart Surgery;  Laterality: N/A;   IABP INSERTION N/A 11/16/2021   Procedure: IABP Insertion;  Surgeon: Marykay Lex, MD;  Location: [facility] INVASIVE CV LAB;  Service: Cardiovascular;  Laterality: N/A;   LEFT HEART CATH AND CORONARY ANGIOGRAPHY N/A 11/16/2021   Procedure: LEFT HEART CATH AND CORONARY ANGIOGRAPHY;  Surgeon: Marykay Lex, MD;  Location: [facility] INVASIVE CV LAB;  Service: Cardiovascular;  Laterality: N/A;   REPLACEMENT TOTAL KNEE Right    SIMPLE MASTECTOMY WITH AXILLARY SENTINEL NODE BIOPSY Left 06/25/2022   Procedure: SIMPLE MASTECTOMY;  Surgeon: Franky Macho, MD;  Location: [facility];  Service: General;  Laterality: Left;   TEE WITHOUT CARDIOVERSION N/A 11/16/2021   Procedure: TRANSESOPHAGEAL ECHOCARDIOGRAM (TEE);  Surgeon: Alleen Borne, MD;  Location: [facility] OR;  Service: Open Heart Surgery;  Laterality: N/A;   TUBAL LIGATION     WOUND EXPLORATION Left 07/26/2022   Procedure: EVACUATION  OF HEMATOMA BREAST, DEBRIDEMENT OF LEFT BREAST WOUND;  Surgeon: Franky Macho, MD;  Location: [facility];  Service: General;  Laterality: Left;    Social History: Social History   Socioeconomic History   Marital status: Widowed    Spouse name: Not on file   Number of children: Not on file   Years of education: Not on file   Highest education level: Not on file  Occupational History   Not on file  Tobacco Use   Smoking status: Every Day    Current packs/day: 0.50    Average packs/day: 0.5 packs/day for 40.0 years (20.0 ttl pk-yrs)    Types: Cigarettes   Smokeless tobacco: Never  Vaping Use   Vaping status: Never Used  Substance and Sexual Activity   Alcohol use: Never   Drug use: Never   Sexual activity: Not Currently  Other Topics Concern   Not on file  Social History Narrative   Not on file   Social Drivers of Health   Financial Resource Strain: Low Risk  (02/10/2023)   Received  from [facility]   Overall Financial Resource Strain (CARDIA)    Difficulty of Paying Living Expenses: Not hard at all  Recent Concern: Financial Resource Strain - Medium Risk (11/24/2022)   Received from [facility]   Overall Financial Resource Strain (CARDIA)    Difficulty of Paying Living Expenses: Somewhat hard  Food Insecurity: No Food Insecurity (02/10/2023)   Received from [facility]   Hunger Vital Sign    Worried About Running Out of Food in the Last Year: Never true    Ran Out of Food in the Last Year: Never true  Recent Concern: Food Insecurity - Food Insecurity Present (11/24/2022)   Received from [facility]   Hunger Vital Sign    Worried About Running Out of Food in the Last Year: Sometimes true    Ran Out of Food in the Last Year: Sometimes true  Transportation Needs: Unmet Transportation Needs (12/30/2022)   Received from [facility]   PRAPARE - Transportation    Lack of Transportation (Medical): Yes    Lack of Transportation (Non-Medical): Yes  Physical Activity: Insufficiently Active (12/30/2022)   Received from [facility]   Exercise Vital Sign    Days of Exercise per Week: 4 days    Minutes of Exercise per Session: 30 min  Stress: Stress Concern Present (02/10/2023)   Received from [facility] - Occupational Stress Questionnaire    Feeling of Stress : To some extent  Social Connections: Unknown (02/10/2023)   Received from [facility]   Social Connection and Isolation Panel [NHANES]    Frequency of Communication with Friends and Family: More than three times a week    Frequency of Social Gatherings with Friends and Family: Three times a week    Attends Religious Services: 1 to 4 times per year    Active Member of Clubs or Organizations: No    Attends Banker Meetings: More than 4 times per year    Marital Status: Not on file  Intimate Partner Violence: Not At Risk (02/10/2023)    Received from [facility]   Humiliation, Afraid, Rape, and Kick questionnaire    Fear of Current or Ex-Partner: No    Emotionally Abused: No    Physically Abused: No    Sexually Abused: No    Family History: Family History  Problem  Relation Age of Onset   COPD Mother    Alcohol abuse Father    Hypertension Sister    Heart attack Brother     Current Medications:  Current Outpatient Medications:    ACCU-CHEK GUIDE TEST test strip, USE TO TEST BLOOD SUGAR UP TO FOUR TIMES DAILY(IN THE MORNING BEFORE BREAKFAST AND 2 HOURS AFTER EACH MEAL)., Disp: , Rfl:    acetaminophen (TYLENOL) 325 MG tablet, Take 2 tablets (650 mg total) by mouth every 6 (six) hours as needed for mild pain (or Fever >/= 101)., Disp: 100 tablet, Rfl: 2   ALLERGY RELIEF 180 MG tablet, Take by mouth., Disp: , Rfl:    ALPRAZolam (XANAX) 1 MG tablet, Take 1 tablet po 1 hour prior to scan. Take bottle with you to the scan. If needed you can take the second tablet 2 hrs after the first one., Disp: , Rfl:    anastrozole (ARIMIDEX) 1 MG tablet, TAKE 1 TABLET(1 MG) BY MOUTH DAILY, Disp: 90 tablet, Rfl: 3   Ascorbic Acid (VITAMIN C) 1000 MG tablet, Take 1,000 mg by mouth daily., Disp: , Rfl:    aspirin EC 81 MG tablet, Take 1 tablet (81 mg total) by mouth daily. Swallow whole., Disp: 30 tablet, Rfl: 12   B-D ULTRAFINE III SHORT PEN 31G X 8 MM MISC, SMARTSIG:injection Daily, Disp: , Rfl:    baclofen (LIORESAL) 10 MG tablet, Take 10 mg by mouth 2 (two) times daily as needed., Disp: , Rfl:    buprenorphine (SUBUTEX) 2 MG SUBL SL tablet, SMARTSIG:2 Tablet(s) Sublingual Every 8 Hours, Disp: , Rfl:    celecoxib (CELEBREX) 50 MG capsule, Take 1 capsule (50 mg total) by mouth 2 (two) times daily as needed for pain., Disp: 60 capsule, Rfl: 1   cholecalciferol (VITAMIN D3) 25 MCG (1000 UNIT) tablet, Take 1,000 Units by mouth daily., Disp: , Rfl:    citalopram (CELEXA) 20 MG tablet, Take 1 tablet by mouth daily., Disp: , Rfl:     clopidogrel (PLAVIX) 75 MG tablet, TAKE 1 TABLET BY MOUTH EVERY DAY, Disp: 90 tablet, Rfl: 1   cyclobenzaprine (FLEXERIL) 10 MG tablet, Take 10 mg by mouth 2 (two) times daily as needed for muscle spasms., Disp: , Rfl:    DULoxetine (CYMBALTA) 60 MG capsule, Take 60 mg by mouth daily., Disp: , Rfl:    escitalopram (LEXAPRO) 10 MG tablet, Take 10 mg by mouth daily., Disp: , Rfl:    ezetimibe (ZETIA) 10 MG tablet, Take 10 mg by mouth daily., Disp: , Rfl:    furosemide (LASIX) 40 MG tablet, TAKE 1 AND 1/2 TABLETS(60 MG) BY MOUTH DAILY, Disp: 135 tablet, Rfl: 2   gabapentin (NEURONTIN) 100 MG capsule, Take 100 mg by mouth daily., Disp: , Rfl:    glimepiride (AMARYL) 4 MG tablet, Take 4 mg by mouth daily with breakfast., Disp: , Rfl:    hydrochlorothiazide (HYDRODIURIL) 25 MG tablet, Take 25 mg by mouth daily., Disp: , Rfl:    hydrOXYzine (ATARAX) 25 MG tablet, Take 25 mg by mouth daily., Disp: , Rfl:    isosorbide mononitrate (IMDUR) 30 MG 24 hr tablet, Take 1 tablet (30 mg total) by mouth daily., Disp: 30 tablet, Rfl: 2   LANTUS SOLOSTAR 100 UNIT/ML Solostar Pen, Inject 25 Units into the skin at bedtime., Disp: 15 mL, Rfl: 11   losartan (COZAAR) 100 MG tablet, Take 1 tablet by mouth daily., Disp: , Rfl:    lubiprostone (AMITIZA) 24 MCG capsule, Take 24 mcg by  mouth 2 (two) times daily., Disp: , Rfl:    metFORMIN (GLUCOPHAGE) 500 MG tablet, Take 500-1,000 mg by mouth See admin instructions. Take 2 tablets (1000 mg) in the morning and Take 1 tablets (500 mg) in the evening, Disp: , Rfl:    metoCLOPramide (REGLAN) 10 MG tablet, Take 1 tablet (10 mg total) by mouth every 6 (six) hours., Disp: 12 tablet, Rfl: 0   metoprolol tartrate (LOPRESSOR) 25 MG tablet, Take 1 tablet (25 mg total) by mouth 2 (two) times daily., Disp: 60 tablet, Rfl: 1   Multiple Vitamin (MULTIVITAMIN) capsule, Take 1 capsule by mouth daily., Disp: , Rfl:    nitroGLYCERIN (NITROSTAT) 0.4 MG SL tablet, Place 1 tablet (0.4 mg total)  under the tongue every 5 (five) minutes x 3 doses as needed for chest pain (if no relief after 3rd dose, proceed to ED or call 911)., Disp: 75 tablet, Rfl: 1   ondansetron (ZOFRAN-ODT) 4 MG disintegrating tablet, Take 4 mg by mouth every 8 (eight) hours as needed for vomiting or nausea. For 7 days supply, Disp: , Rfl:    oxyCODONE-acetaminophen (PERCOCET) 10-325 MG tablet, Take 1 tablet by mouth every 4 (four) hours as needed for pain., Disp: 15 tablet, Rfl: 0   potassium chloride SA (KLOR-CON M) 20 MEQ tablet, Take 20 mEq by mouth daily., Disp: , Rfl:    pregabalin (LYRICA) 50 MG capsule, Take 1 capsule (50 mg total) by mouth 3 (three) times daily as needed., Disp: 90 capsule, Rfl: 5   rosuvastatin (CRESTOR) 10 MG tablet, Take 10 mg by mouth daily., Disp: , Rfl:    vitamin B-12 (CYANOCOBALAMIN) 1000 MCG tablet, Take 1,000 mcg by mouth daily., Disp: , Rfl:    Allergies: Allergies  Allergen Reactions   Latex Rash    REVIEW OF SYSTEMS:   Review of Systems  Constitutional:  Negative for chills, fatigue and fever.  HENT:   Negative for lump/mass, mouth sores, nosebleeds, sore throat and trouble swallowing.   Eyes:  Negative for eye problems.  Respiratory:  Positive for shortness of breath. Negative for cough.   Cardiovascular:  Negative for chest pain, leg swelling and palpitations.  Gastrointestinal:  Positive for constipation. Negative for abdominal pain, diarrhea, nausea and vomiting.  Genitourinary:  Negative for bladder incontinence, difficulty urinating, dysuria, frequency, hematuria and nocturia.   Musculoskeletal:  Negative for arthralgias, back pain, flank pain, myalgias and neck pain.  Skin:  Negative for itching and rash.  Neurological:  Positive for dizziness and numbness. Negative for headaches.  Hematological:  Does not bruise/bleed easily.  Psychiatric/Behavioral:  Negative for depression, sleep disturbance and suicidal ideas. The patient is not nervous/anxious.   All other  systems reviewed and are negative.    VITALS:   Blood pressure 122/62, pulse 82, temperature 97.9 F (36.6 C), temperature source Tympanic, resp. rate 20, weight 213 lb 3 oz (96.7 kg), SpO2 100%.  Wt Readings from Last 3 Encounters:  08/31/23 213 lb 3 oz (96.7 kg)  05/31/23 217 lb (98.4 kg)  04/06/23 222 lb 6.4 oz (100.9 kg)    Body mass index is 36.59 kg/m.  Performance status (ECOG): 1 - Symptomatic but completely ambulatory  PHYSICAL EXAM:   Physical Exam Vitals and nursing note reviewed. Exam conducted with a chaperone present.  Constitutional:      Appearance: Normal appearance.  Cardiovascular:     Rate and Rhythm: Normal rate and regular rhythm.     Pulses: Normal pulses.     Heart sounds:  Normal heart sounds.  Pulmonary:     Effort: Pulmonary effort is normal.     Breath sounds: Normal breath sounds.  Abdominal:     Palpations: Abdomen is soft. There is no hepatomegaly, splenomegaly or mass.     Tenderness: There is no abdominal tenderness.  Musculoskeletal:     Right lower leg: No edema.     Left lower leg: No edema.  Lymphadenopathy:     Cervical: No cervical adenopathy.     Right cervical: No superficial, deep or posterior cervical adenopathy.    Left cervical: No superficial, deep or posterior cervical adenopathy.     Upper Body:     Right upper body: No supraclavicular or axillary adenopathy.     Left upper body: No supraclavicular or axillary adenopathy.  Neurological:     General: No focal deficit present.     Mental Status: She is alert and oriented to person, place, and time.  Psychiatric:        Mood and Affect: Mood normal.        Behavior: Behavior normal.     LABS:      Latest Ref Rng & Units 08/23/2023    1:19 PM 04/06/2023    5:16 PM 02/15/2023    1:49 PM  CBC  WBC 4.0 - 10.5 K/uL 6.3  6.9  6.2   Hemoglobin 12.0 - 15.0 g/dL 16.1  09.6  04.5   Hematocrit 36.0 - 46.0 % 39.3  46.4  45.2   Platelets 150 - 400 K/uL 254  222  241        Latest Ref Rng & Units 08/23/2023    1:19 PM 04/06/2023    5:16 PM 02/15/2023    1:49 PM  CMP  Glucose 70 - 99 mg/dL 409  811  914   BUN 8 - 23 mg/dL 22  23  16    Creatinine 0.44 - 1.00 mg/dL 7.82  9.56  2.13   Sodium 135 - 145 mmol/L 134  134  136   Potassium 3.5 - 5.1 mmol/L 4.1  3.8  4.3   Chloride 98 - 111 mmol/L 98  93  101   CO2 22 - 32 mmol/L 27  27  25    Calcium 8.9 - 10.3 mg/dL 08.6  57.8  46.9   Total Protein 6.5 - 8.1 g/dL 6.9  8.2  7.8   Total Bilirubin 0.0 - 1.2 mg/dL 0.5  0.8  0.5   Alkaline Phos 38 - 126 U/L 52  84  70   AST 15 - 41 U/L 19  20  27    ALT 0 - 44 U/L 13  18  24       No results found for: "CEA1", "CEA" / No results found for: "CEA1", "CEA" No results found for: "PSA1" No results found for: "GEX528" No results found for: "CAN125"  No results found for: "TOTALPROTELP", "ALBUMINELP", "A1GS", "A2GS", "BETS", "BETA2SER", "GAMS", "MSPIKE", "SPEI" No results found for: "TIBC", "FERRITIN", "IRONPCTSAT" No results found for: "LDH"   STUDIES:   No results found.

## 2023-08-31 ENCOUNTER — Inpatient Hospital Stay: Payer: Medicare (Managed Care) | Admitting: Hematology

## 2023-08-31 VITALS — BP 122/62 | HR 82 | Temp 97.9°F | Resp 20 | Wt 213.2 lb

## 2023-08-31 DIAGNOSIS — Z1231 Encounter for screening mammogram for malignant neoplasm of breast: Secondary | ICD-10-CM | POA: Diagnosis not present

## 2023-08-31 DIAGNOSIS — C50412 Malignant neoplasm of upper-outer quadrant of left female breast: Secondary | ICD-10-CM | POA: Diagnosis not present

## 2023-08-31 NOTE — Patient Instructions (Signed)
 [facility] at [facility] Discharge Instructions   You were seen and examined today by Dr. Ellin Saba.  He reviewed the results of your lab work which are mostly normal/stable. Your kidney function is elevated. Dr. Kirtland Bouchard would like to refer you to a kidney specialist.   We will see you back in 6 months.   We will arrange for you to have a mammogram in June.   Return as scheduled.    Thank you for choosing [facility] at [facility] to provide your oncology and hematology care.  To afford each patient quality time with our provider, please arrive at least 15 minutes before your scheduled appointment time.   If you have a lab appointment with the Cancer Center please come in thru the Main Entrance and check in at the main information desk.  You need to re-schedule your appointment should you arrive 10 or more minutes late.  We strive to give you quality time with our providers, and arriving late affects you and other patients whose appointments are after yours.  Also, if you no show three or more times for appointments you may be dismissed from the clinic at the providers discretion.     Again, thank you for choosing [facility].  Our hope is that these requests will decrease the amount of time that you wait before being seen by our physicians.       _____________________________________________________________  Should you have questions after your visit to [facility], please contact our office at [phone] and follow the prompts.  Our office hours are 8:00 a.m. and 4:30 p.m. Monday - Friday.  Please note that voicemails left after 4:00 p.m. may not be returned until the following business day.  We are closed weekends and major holidays.  You do have access to a nurse 24-7, just call the main number to the clinic [phone] and do not press any options, hold on the line and a nurse will answer the phone.    For  prescription refill requests, have your pharmacy contact our office and allow 72 hours.    Due to Covid, you will need to wear a mask upon entering the hospital. If you do not have a mask, a mask will be given to you at the Main Entrance upon arrival. For doctor visits, patients may have 1 support person age 37 or older with them. For treatment visits, patients can not have anyone with them due to social distancing guidelines and our immunocompromised population.

## 2023-09-07 ENCOUNTER — Telehealth: Payer: Self-pay | Admitting: Neurology

## 2023-09-07 NOTE — Telephone Encounter (Signed)
 Appointment made as a result of symptoms failing to improve

## 2023-09-13 ENCOUNTER — Other Ambulatory Visit: Payer: Self-pay | Admitting: Hematology

## 2023-09-13 ENCOUNTER — Telehealth: Payer: Self-pay | Admitting: *Deleted

## 2023-09-13 DIAGNOSIS — T451X5A Adverse effect of antineoplastic and immunosuppressive drugs, initial encounter: Secondary | ICD-10-CM | POA: Insufficient documentation

## 2023-09-13 MED ORDER — VEOZAH 45 MG PO TABS
45.0000 mg | ORAL_TABLET | Freq: Every day | ORAL | 5 refills | Status: DC
Start: 2023-09-13 — End: 2023-09-15

## 2023-09-13 NOTE — Telephone Encounter (Signed)
 Patient was in on 4/9 and had been tolerating Anastrozole  without any problems.  Last week she begun to have severe hot flashes, headaches and profuse sweating and has ever since.  Dr. Cheree Cords has sent a prescription for Veozah  45 mg daily.  She will let us  know if there is any problems with insurance.

## 2023-09-15 ENCOUNTER — Other Ambulatory Visit: Payer: Self-pay | Admitting: *Deleted

## 2023-09-15 DIAGNOSIS — T451X5A Adverse effect of antineoplastic and immunosuppressive drugs, initial encounter: Secondary | ICD-10-CM

## 2023-09-15 MED ORDER — VEOZAH 45 MG PO TABS: 45.0000 mg | ORAL_TABLET | Freq: Every day | ORAL | 5 refills | Status: DC

## 2023-09-15 MED ORDER — ONDANSETRON 4 MG PO TBDP: 4.0000 mg | ORAL_TABLET | Freq: Three times a day (TID) | ORAL | 2 refills | Status: AC | PRN

## 2023-09-15 MED ORDER — HYDROXYZINE HCL 25 MG PO TABS: 25.0000 mg | ORAL_TABLET | Freq: Every day | ORAL | 0 refills | Status: DC

## 2023-09-19 ENCOUNTER — Other Ambulatory Visit: Payer: Self-pay | Admitting: Neurology

## 2023-10-04 ENCOUNTER — Ambulatory Visit: Payer: Medicare (Managed Care) | Attending: Cardiology | Admitting: Cardiology

## 2023-10-04 ENCOUNTER — Encounter: Payer: Self-pay | Admitting: Cardiology

## 2023-10-04 VITALS — BP 130/74 | HR 88 | Ht 64.0 in | Wt 209.0 lb

## 2023-10-04 DIAGNOSIS — I25119 Atherosclerotic heart disease of native coronary artery with unspecified angina pectoris: Secondary | ICD-10-CM | POA: Diagnosis not present

## 2023-10-04 DIAGNOSIS — E782 Mixed hyperlipidemia: Secondary | ICD-10-CM | POA: Diagnosis not present

## 2023-10-04 DIAGNOSIS — I10 Essential (primary) hypertension: Secondary | ICD-10-CM | POA: Diagnosis not present

## 2023-10-04 MED ORDER — ROSUVASTATIN CALCIUM 20 MG PO TABS: 20.0000 mg | ORAL_TABLET | Freq: Every day | ORAL | 3 refills | Status: DC

## 2023-10-04 NOTE — Progress Notes (Signed)
    Cardiology Office Note  Date: 10/04/2023   ID: Katelyn Kaufman, DOB 1951-05-11, MRN [id number]  History of Present Illness: Katelyn Kaufman is a 73 y.o. female last seen in November 2024 by Ms. Clementine Cutting NP, I reviewed her note.  She is here today with family member for a follow-up visit.  She does not report any angina or increasing dyspnea on exertion, no interval nitroglycerin  use.  She has trouble with her balance and has also been experiencing some memory deficits, has a visit scheduled to see neurology next month.  She does not report any palpitations or syncope.  We went over her medications.  We discussed stopping Plavix  at this point, she will stay on aspirin  81 mg daily.  Also discussed uptitrating Crestor  to 20 mg daily in light of her last LDL 88 in February.  Blood pressure today is reasonable.  Physical Exam: VS:  BP 130/74 (BP Location: Right Arm, Patient Position: Sitting, Cuff Size: Normal)   Pulse 88   Ht 5\' 4"  (1.626 m)   Wt 209 lb (94.8 kg)   SpO2 96%   BMI 35.87 kg/m , BMI Body mass index is 35.87 kg/m.  Wt Readings from Last 3 Encounters:  10/04/23 209 lb (94.8 kg)  08/31/23 213 lb 3 oz (96.7 kg)  05/31/23 217 lb (98.4 kg)    General: Patient appears comfortable at rest. HEENT: Conjunctiva and lids normal. Neck: Supple, no elevated JVP or carotid bruits. Lungs: Clear to auscultation, nonlabored breathing at rest. Cardiac: Regular rate and rhythm, no S3, 1/6 systolic murmur. Extremities: No pitting edema, distal pulses 2+.  ECG:  An ECG dated 04/06/2023 was personally reviewed today and demonstrated:  Sinus rhythm.  Labwork: 05/31/2023: TSH 1.470 08/23/2023: ALT 13; AST 19; BUN 22; Creatinine, Ser 1.54; Hemoglobin 12.9; Platelets 254; Potassium 4.1; Sodium 134     Component Value Date/Time   CHOL 130 05/18/2022 1428   TRIG 97 05/18/2022 1428   HDL 37 (L) 05/18/2022 1428   CHOLHDL 3.5 05/18/2022 1428   VLDL 19 05/18/2022 1428   LDLCALC 74 05/18/2022 1428   February 2025: Cholesterol 158, triglycerides 122, HDL 46, LDL 88  Other Studies Reviewed Today:  No interval cardiac testing for review today.  Assessment and Plan:  1.  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  LVEF 60 to 65% at that time.  She does not report any angina or interval nitroglycerin  use.  Stopping Plavix  at this point.  Continue aspirin  81 mg daily, Imdur  30 mg daily, Crestor  which will be uptitrated to 20 mg daily, and as needed nitroglycerin .   2.  Primary hypertension.  Continue Lopressor  25 mg twice daily, HCTZ 25 mg daily and Cozaar  100 mg daily.   3.  Mixed hyperlipidemia.  Follow-up LDL 88 in February.  Increase Crestor  to 20 mg daily.   Disposition:  Follow up 6 months.  Signed, Gerard Knight, M.D., F.A.C.C. [facility]

## 2023-10-04 NOTE — Patient Instructions (Addendum)
 Medication Instructions:  Your physician has recommended you make the following change in your medication:  Stop clopidogrel  Increase rosuvastatin  to 20 mg daily Continue all other medications as prescribed  Labwork: none  Testing/Procedures: none  Follow-Up: Your physician recommends that you schedule a follow-up appointment in: 6 months  Any Other Special Instructions Will Be Listed Below (If Applicable).  If you need a refill on your cardiac medications before your next appointment, please call your pharmacy.

## 2023-10-10 ENCOUNTER — Encounter: Payer: Self-pay | Admitting: Neurology

## 2023-10-10 ENCOUNTER — Telehealth: Payer: Self-pay | Admitting: Neurology

## 2023-10-10 NOTE — Telephone Encounter (Signed)
 LVM and sent mychart msg, cx letter informing pt of r/s needed for 6/26 appt- MD out.

## 2023-10-12 ENCOUNTER — Other Ambulatory Visit: Payer: Self-pay | Admitting: *Deleted

## 2023-10-12 ENCOUNTER — Encounter: Payer: Self-pay | Admitting: *Deleted

## 2023-10-12 MED ORDER — GABAPENTIN 100 MG PO CAPS: 100.0000 mg | ORAL_CAPSULE | Freq: Three times a day (TID) | ORAL | 0 refills | Status: DC | PRN

## 2023-10-13 ENCOUNTER — Telehealth: Payer: Self-pay | Admitting: *Deleted

## 2023-10-13 NOTE — Telephone Encounter (Signed)
 Patient called with concerns about day and night hot flashes.  Per Dr. Katragadda, she was advised to increase her gabapentin  to 100 mg 2-3 times a day as needed.  New script sent to pharmacy.  Verbalized understanding.

## 2023-10-18 ENCOUNTER — Other Ambulatory Visit: Payer: Self-pay | Admitting: Hematology

## 2023-10-19 ENCOUNTER — Other Ambulatory Visit (HOSPITAL_COMMUNITY): Payer: Self-pay | Admitting: Nephrology

## 2023-10-19 DIAGNOSIS — N1832 Chronic kidney disease, stage 3b: Secondary | ICD-10-CM

## 2023-10-19 DIAGNOSIS — E1129 Type 2 diabetes mellitus with other diabetic kidney complication: Secondary | ICD-10-CM

## 2023-10-19 DIAGNOSIS — R809 Proteinuria, unspecified: Secondary | ICD-10-CM

## 2023-10-19 DIAGNOSIS — E1142 Type 2 diabetes mellitus with diabetic polyneuropathy: Secondary | ICD-10-CM

## 2023-10-20 ENCOUNTER — Telehealth: Payer: Self-pay | Admitting: Cardiology

## 2023-10-20 ENCOUNTER — Telehealth: Payer: Self-pay | Admitting: *Deleted

## 2023-10-20 NOTE — Telephone Encounter (Signed)
 Pt is requesting a cb due to hot flashes and excessive sweating, woke up this morning sick on her stomach. Having difficulty reaching pcp

## 2023-10-20 NOTE — Telephone Encounter (Signed)
 Forms dropped off at office yesterday and sent to provider. Provider request forms be sent to nursing to manage. After review of forms and questions, this form is deferred to PCP. Left message for patient to call office.

## 2023-10-24 ENCOUNTER — Other Ambulatory Visit: Payer: Self-pay

## 2023-10-24 ENCOUNTER — Emergency Department (HOSPITAL_COMMUNITY)

## 2023-10-24 ENCOUNTER — Encounter (HOSPITAL_COMMUNITY): Payer: Self-pay | Admitting: Emergency Medicine

## 2023-10-24 ENCOUNTER — Inpatient Hospital Stay (HOSPITAL_COMMUNITY)
Admission: EM | Admit: 2023-10-24 | Discharge: 2023-10-27 | DRG: 303 | Disposition: A | Discharge status: Home / Self Care | Attending: Internal Medicine | Admitting: Internal Medicine

## 2023-10-24 DIAGNOSIS — N179 Acute kidney failure, unspecified: Secondary | ICD-10-CM | POA: Diagnosis present

## 2023-10-24 DIAGNOSIS — I25110 Atherosclerotic heart disease of native coronary artery with unstable angina pectoris: Principal | ICD-10-CM | POA: Diagnosis present

## 2023-10-24 DIAGNOSIS — Z8249 Family history of ischemic heart disease and other diseases of the circulatory system: Secondary | ICD-10-CM

## 2023-10-24 DIAGNOSIS — F17210 Nicotine dependence, cigarettes, uncomplicated: Secondary | ICD-10-CM | POA: Diagnosis present

## 2023-10-24 DIAGNOSIS — Z825 Family history of asthma and other chronic lower respiratory diseases: Secondary | ICD-10-CM

## 2023-10-24 DIAGNOSIS — J449 Chronic obstructive pulmonary disease, unspecified: Secondary | ICD-10-CM | POA: Diagnosis present

## 2023-10-24 DIAGNOSIS — Z9841 Cataract extraction status, right eye: Secondary | ICD-10-CM

## 2023-10-24 DIAGNOSIS — Z7902 Long term (current) use of antithrombotics/antiplatelets: Secondary | ICD-10-CM

## 2023-10-24 DIAGNOSIS — F17200 Nicotine dependence, unspecified, uncomplicated: Secondary | ICD-10-CM | POA: Diagnosis not present

## 2023-10-24 DIAGNOSIS — Z91040 Latex allergy status: Secondary | ICD-10-CM

## 2023-10-24 DIAGNOSIS — E66812 Obesity, class 2: Secondary | ICD-10-CM | POA: Diagnosis present

## 2023-10-24 DIAGNOSIS — R079 Chest pain, unspecified: Secondary | ICD-10-CM | POA: Diagnosis not present

## 2023-10-24 DIAGNOSIS — Z853 Personal history of malignant neoplasm of breast: Secondary | ICD-10-CM

## 2023-10-24 DIAGNOSIS — G894 Chronic pain syndrome: Secondary | ICD-10-CM | POA: Diagnosis present

## 2023-10-24 DIAGNOSIS — N1832 Chronic kidney disease, stage 3b: Secondary | ICD-10-CM | POA: Diagnosis present

## 2023-10-24 DIAGNOSIS — E785 Hyperlipidemia, unspecified: Secondary | ICD-10-CM | POA: Diagnosis present

## 2023-10-24 DIAGNOSIS — G4733 Obstructive sleep apnea (adult) (pediatric): Secondary | ICD-10-CM | POA: Diagnosis present

## 2023-10-24 DIAGNOSIS — I200 Unstable angina: Principal | ICD-10-CM

## 2023-10-24 DIAGNOSIS — Z7984 Long term (current) use of oral hypoglycemic drugs: Secondary | ICD-10-CM

## 2023-10-24 DIAGNOSIS — Z961 Presence of intraocular lens: Secondary | ICD-10-CM | POA: Diagnosis present

## 2023-10-24 DIAGNOSIS — E1165 Type 2 diabetes mellitus with hyperglycemia: Secondary | ICD-10-CM | POA: Diagnosis not present

## 2023-10-24 DIAGNOSIS — E1122 Type 2 diabetes mellitus with diabetic chronic kidney disease: Secondary | ICD-10-CM | POA: Diagnosis present

## 2023-10-24 DIAGNOSIS — Z96651 Presence of right artificial knee joint: Secondary | ICD-10-CM | POA: Diagnosis present

## 2023-10-24 DIAGNOSIS — Z9842 Cataract extraction status, left eye: Secondary | ICD-10-CM

## 2023-10-24 DIAGNOSIS — R5381 Other malaise: Secondary | ICD-10-CM

## 2023-10-24 DIAGNOSIS — I129 Hypertensive chronic kidney disease with stage 1 through stage 4 chronic kidney disease, or unspecified chronic kidney disease: Secondary | ICD-10-CM | POA: Diagnosis present

## 2023-10-24 DIAGNOSIS — Z79899 Other long term (current) drug therapy: Secondary | ICD-10-CM

## 2023-10-24 DIAGNOSIS — I10 Essential (primary) hypertension: Secondary | ICD-10-CM | POA: Diagnosis not present

## 2023-10-24 DIAGNOSIS — Z9012 Acquired absence of left breast and nipple: Secondary | ICD-10-CM

## 2023-10-24 DIAGNOSIS — E118 Type 2 diabetes mellitus with unspecified complications: Secondary | ICD-10-CM

## 2023-10-24 DIAGNOSIS — I252 Old myocardial infarction: Secondary | ICD-10-CM

## 2023-10-24 DIAGNOSIS — F419 Anxiety disorder, unspecified: Secondary | ICD-10-CM | POA: Diagnosis present

## 2023-10-24 DIAGNOSIS — R002 Palpitations: Secondary | ICD-10-CM | POA: Diagnosis present

## 2023-10-24 DIAGNOSIS — Z79811 Long term (current) use of aromatase inhibitors: Secondary | ICD-10-CM

## 2023-10-24 DIAGNOSIS — Z6835 Body mass index (BMI) 35.0-35.9, adult: Secondary | ICD-10-CM

## 2023-10-24 DIAGNOSIS — E861 Hypovolemia: Secondary | ICD-10-CM | POA: Diagnosis present

## 2023-10-24 DIAGNOSIS — F32A Depression, unspecified: Secondary | ICD-10-CM | POA: Diagnosis present

## 2023-10-24 DIAGNOSIS — Z951 Presence of aortocoronary bypass graft: Secondary | ICD-10-CM

## 2023-10-24 DIAGNOSIS — Z7982 Long term (current) use of aspirin: Secondary | ICD-10-CM

## 2023-10-24 DIAGNOSIS — N183 Chronic kidney disease, stage 3 unspecified: Secondary | ICD-10-CM | POA: Diagnosis present

## 2023-10-24 DIAGNOSIS — Z794 Long term (current) use of insulin: Secondary | ICD-10-CM

## 2023-10-24 DIAGNOSIS — Z716 Tobacco abuse counseling: Secondary | ICD-10-CM

## 2023-10-24 LAB — GLUCOSE, CAPILLARY
Glucose-Capillary: 194 mg/dL — ABNORMAL HIGH (ref 70–99)
Glucose-Capillary: 194 mg/dL — ABNORMAL HIGH (ref 70–99)

## 2023-10-24 LAB — BASIC METABOLIC PANEL WITH GFR
Anion gap: 12 (ref 5–15)
BUN: 28 mg/dL — ABNORMAL HIGH (ref 8–23)
CO2: 24 mmol/L (ref 22–32)
Calcium: 10.9 mg/dL — ABNORMAL HIGH (ref 8.9–10.3)
Chloride: 101 mmol/L (ref 98–111)
Creatinine, Ser: 1.96 mg/dL — ABNORMAL HIGH (ref 0.44–1.00)
GFR, Estimated: 27 mL/min — ABNORMAL LOW (ref >60)
Glucose, Bld: 123 mg/dL — ABNORMAL HIGH (ref 70–99)
Potassium: 4.1 mmol/L (ref 3.5–5.1)
Sodium: 137 mmol/L (ref 135–145)

## 2023-10-24 LAB — CBC
HCT: 43.5 % (ref 36.0–46.0)
Hemoglobin: 14.8 g/dL (ref 12.0–15.0)
MCH: 31.7 pg (ref 26.0–34.0)
MCHC: 34 g/dL (ref 30.0–36.0)
MCV: 93.1 fL (ref 80.0–100.0)
Platelets: 229 10*3/uL (ref 150–400)
RBC: 4.67 MIL/uL (ref 3.87–5.11)
RDW: 13.9 % (ref 11.5–15.5)
WBC: 6.4 10*3/uL (ref 4.0–10.5)
nRBC: 0 % (ref 0.0–0.2)

## 2023-10-24 LAB — HEPATIC FUNCTION PANEL
ALT: 26 U/L (ref 0–44)
AST: 28 U/L (ref 15–41)
Albumin: 4.3 g/dL (ref 3.5–5.0)
Alkaline Phosphatase: 66 U/L (ref 38–126)
Bilirubin, Direct: <0.1 mg/dL (ref 0.0–0.2)
Total Bilirubin: 0.5 mg/dL (ref 0.0–1.2)
Total Protein: 8 g/dL (ref 6.5–8.1)

## 2023-10-24 LAB — LIPASE, BLOOD: Lipase: 33 U/L (ref 11–51)

## 2023-10-24 LAB — TROPONIN I (HIGH SENSITIVITY)
Troponin I (High Sensitivity): 40 ng/L — ABNORMAL HIGH (ref <18)
Troponin I (High Sensitivity): 43 ng/L — ABNORMAL HIGH (ref <18)

## 2023-10-24 LAB — HEMOGLOBIN A1C
Hgb A1c MFr Bld: 8.3 % — ABNORMAL HIGH (ref 4.8–5.6)
Mean Plasma Glucose: 191.51 mg/dL

## 2023-10-24 MED ORDER — TRAZODONE HCL 50 MG PO TABS
50.0000 mg | ORAL_TABLET | Freq: Every evening | ORAL | Status: DC | PRN
  Administered 2023-10-24 – 2023-10-25 (×2): 50 mg via ORAL
  Filled 2023-10-24 (×2): qty 1

## 2023-10-24 MED ORDER — NITROGLYCERIN 0.4 MG SL SUBL: 0.4000 mg | SUBLINGUAL_TABLET | SUBLINGUAL | Status: DC | PRN

## 2023-10-24 MED ORDER — HEPARIN SODIUM (PORCINE) 5000 UNIT/ML IJ SOLN
5000.0000 [IU] | Freq: Three times a day (TID) | INTRAMUSCULAR | Status: DC
  Administered 2023-10-24 – 2023-10-27 (×8): 5000 [IU] via SUBCUTANEOUS
  Filled 2023-10-24 (×8): qty 1

## 2023-10-24 MED ORDER — ROSUVASTATIN CALCIUM 20 MG PO TABS
20.0000 mg | ORAL_TABLET | Freq: Every day | ORAL | Status: DC
  Administered 2023-10-25 – 2023-10-27 (×3): 20 mg via ORAL
  Filled 2023-10-24 (×3): qty 1

## 2023-10-24 MED ORDER — SODIUM CHLORIDE 0.9% FLUSH
3.0000 mL | Freq: Two times a day (BID) | INTRAVENOUS | Status: DC
  Administered 2023-10-24 – 2023-10-26 (×4): 3 mL via INTRAVENOUS

## 2023-10-24 MED ORDER — GABAPENTIN 100 MG PO CAPS
100.0000 mg | ORAL_CAPSULE | Freq: Two times a day (BID) | ORAL | Status: DC
  Administered 2023-10-24 – 2023-10-27 (×6): 100 mg via ORAL
  Filled 2023-10-24 (×6): qty 1

## 2023-10-24 MED ORDER — INSULIN ASPART 100 UNIT/ML IJ SOLN: 0.0000 [IU] | Freq: Three times a day (TID) | INTRAMUSCULAR | Status: DC

## 2023-10-24 MED ORDER — LIDOCAINE-PRILOCAINE 2.5-2.5 % EX CREA
TOPICAL_CREAM | Freq: Once | CUTANEOUS | Status: AC
  Filled 2023-10-24: qty 5

## 2023-10-24 MED ORDER — ISOSORBIDE MONONITRATE ER 30 MG PO TB24
30.0000 mg | ORAL_TABLET | Freq: Every day | ORAL | Status: DC
  Administered 2023-10-25: 30 mg via ORAL
  Filled 2023-10-24: qty 1

## 2023-10-24 MED ORDER — BUPRENORPHINE HCL 2 MG SL SUBL
2.0000 mg | SUBLINGUAL_TABLET | Freq: Two times a day (BID) | SUBLINGUAL | Status: DC
  Administered 2023-10-24 – 2023-10-27 (×6): 2 mg via SUBLINGUAL
  Filled 2023-10-24 (×6): qty 1

## 2023-10-24 MED ORDER — INSULIN ASPART 100 UNIT/ML IJ SOLN
0.0000 [IU] | Freq: Every day | INTRAMUSCULAR | Status: DC
  Administered 2023-10-25: 2 [IU] via SUBCUTANEOUS

## 2023-10-24 MED ORDER — METOPROLOL TARTRATE 25 MG PO TABS
25.0000 mg | ORAL_TABLET | Freq: Two times a day (BID) | ORAL | Status: DC
  Administered 2023-10-24 – 2023-10-25 (×2): 25 mg via ORAL
  Filled 2023-10-24 (×2): qty 1

## 2023-10-24 MED ORDER — ACETAMINOPHEN 650 MG RE SUPP: 650.0000 mg | Freq: Four times a day (QID) | RECTAL | Status: DC | PRN

## 2023-10-24 MED ORDER — ONDANSETRON HCL 4 MG PO TABS: 4.0000 mg | ORAL_TABLET | Freq: Four times a day (QID) | ORAL | Status: DC | PRN

## 2023-10-24 MED ORDER — ONDANSETRON HCL 4 MG/2ML IJ SOLN
4.0000 mg | Freq: Four times a day (QID) | INTRAMUSCULAR | Status: DC | PRN
Start: 2023-10-24 — End: 2023-10-27
  Administered 2023-10-26: 4 mg via INTRAVENOUS
  Filled 2023-10-24: qty 2

## 2023-10-24 MED ORDER — NICOTINE 14 MG/24HR TD PT24
14.0000 mg | MEDICATED_PATCH | Freq: Every day | TRANSDERMAL | Status: DC
  Administered 2023-10-24 – 2023-10-27 (×4): 14 mg via TRANSDERMAL
  Filled 2023-10-24 (×4): qty 1

## 2023-10-24 MED ORDER — SODIUM CHLORIDE 0.9 % IV SOLN: INTRAVENOUS | Status: AC | PRN

## 2023-10-24 MED ORDER — HYDROXYZINE HCL 25 MG PO TABS: 25.0000 mg | ORAL_TABLET | Freq: Two times a day (BID) | ORAL | Status: DC | PRN

## 2023-10-24 MED ORDER — ASPIRIN 81 MG PO TBEC
81.0000 mg | DELAYED_RELEASE_TABLET | Freq: Every day | ORAL | Status: DC
  Administered 2023-10-25 – 2023-10-27 (×3): 81 mg via ORAL
  Filled 2023-10-24 (×5): qty 1

## 2023-10-24 MED ORDER — ACETAMINOPHEN 325 MG PO TABS
650.0000 mg | ORAL_TABLET | Freq: Four times a day (QID) | ORAL | Status: DC | PRN
  Administered 2023-10-25 – 2023-10-26 (×2): 650 mg via ORAL
  Filled 2023-10-24 (×2): qty 2

## 2023-10-24 MED ORDER — LOSARTAN POTASSIUM 25 MG PO TABS
100.0000 mg | ORAL_TABLET | Freq: Every day | ORAL | Status: DC
  Administered 2023-10-25: 100 mg via ORAL
  Filled 2023-10-24 (×2): qty 4

## 2023-10-24 MED ORDER — FUROSEMIDE 40 MG PO TABS
60.0000 mg | ORAL_TABLET | Freq: Every day | ORAL | Status: DC
  Administered 2023-10-25: 60 mg via ORAL
  Filled 2023-10-24 (×2): qty 2

## 2023-10-24 MED ORDER — INSULIN ASPART 100 UNIT/ML IJ SOLN
0.0000 [IU] | Freq: Three times a day (TID) | INTRAMUSCULAR | Status: DC
  Administered 2023-10-25 – 2023-10-27 (×6): 1 [IU] via SUBCUTANEOUS

## 2023-10-24 MED ORDER — BISACODYL 10 MG RE SUPP: 10.0000 mg | Freq: Every day | RECTAL | Status: DC | PRN

## 2023-10-24 MED ORDER — SODIUM CHLORIDE 0.9% FLUSH
3.0000 mL | Freq: Two times a day (BID) | INTRAVENOUS | Status: DC
  Administered 2023-10-24 – 2023-10-26 (×5): 3 mL via INTRAVENOUS

## 2023-10-24 MED ORDER — POLYETHYLENE GLYCOL 3350 17 G PO PACK
17.0000 g | PACK | Freq: Every day | ORAL | Status: DC | PRN
  Administered 2023-10-24: 17 g via ORAL
  Filled 2023-10-24: qty 1

## 2023-10-24 MED ORDER — DULOXETINE HCL 30 MG PO CPEP
60.0000 mg | ORAL_CAPSULE | Freq: Every day | ORAL | Status: DC
  Administered 2023-10-25 – 2023-10-27 (×3): 60 mg via ORAL
  Filled 2023-10-24 (×3): qty 2

## 2023-10-24 MED ORDER — ACETAMINOPHEN 500 MG PO TABS
1000.0000 mg | ORAL_TABLET | Freq: Once | ORAL | Status: DC
  Filled 2023-10-24: qty 2

## 2023-10-24 MED ORDER — SODIUM CHLORIDE 0.9% FLUSH: 3.0000 mL | INTRAVENOUS | Status: DC | PRN

## 2023-10-24 MED ORDER — ANASTROZOLE 1 MG PO TABS
1.0000 mg | ORAL_TABLET | Freq: Every day | ORAL | Status: DC
  Administered 2023-10-25 – 2023-10-27 (×3): 1 mg via ORAL
  Filled 2023-10-24 (×4): qty 1

## 2023-10-24 NOTE — ED Provider Notes (Signed)
 [place] EMERGENCY DEPARTMENT AT [facility] Provider Note   CSN: [id number] Arrival date & time: 10/24/23  1050     History  Chief Complaint  Patient presents with   Chest Pain    Katelyn Kaufman is a 73 y.o. female.  HPI Patient presents with concern for chest pain.  History is notable for bypass within the past year.  She had been doing well until about 3 days ago when she started having chest pain with activity.  She does not typically take nitroglycerin  but has required it multiple times over the past 3 days pain is left parasternal nonradiating.  She also notes ongoing arthritic pain in multiple joints for which she is taking substantial loss of Tylenol .  She has no nausea, however.    Home Medications Prior to Admission medications   Medication Sig Start Date End Date Taking? Authorizing Provider  ACCU-CHEK GUIDE TEST test strip USE TO TEST BLOOD SUGAR UP TO FOUR TIMES DAILY(IN THE MORNING BEFORE BREAKFAST AND 2 HOURS AFTER EACH MEAL). 08/19/23   [provider]  acetaminophen  (TYLENOL ) 325 MG tablet Take 2 tablets (650 mg total) by mouth every 6 (six) hours as needed for mild pain (or Fever >/= 101). 07/30/22   Colin Dawley, MD  anastrozole  (ARIMIDEX ) 1 MG tablet TAKE 1 TABLET(1 MG) BY MOUTH DAILY 06/06/23   Paulett Boros, MD  Ascorbic Acid  (VITAMIN C ) 1000 MG tablet Take 1,000 mg by mouth daily.    [provider]  aspirin  EC 81 MG tablet Take 1 tablet (81 mg total) by mouth daily. Swallow whole. 11/23/21   Zimmerman, Donielle M, PA-C  buprenorphine (SUBUTEX) 2 MG SUBL SL tablet SMARTSIG:2 Tablet(s) Sublingual Every 8 Hours 03/24/23   [provider]  celecoxib  (CELEBREX ) 50 MG capsule TAKE 1 CAPSULE(50 MG) BY MOUTH TWICE DAILY AS NEEDED FOR PAIN 09/20/23   Phebe Brasil, MD  cholecalciferol  (VITAMIN D3) 25 MCG (1000 UNIT) tablet Take 1,000 Units by mouth daily.    [provider]  cyclobenzaprine  (FLEXERIL ) 10 MG tablet Take  10 mg by mouth 2 (two) times daily as needed for muscle spasms. 07/17/20   [provider]  DULoxetine  (CYMBALTA ) 60 MG capsule Take 60 mg by mouth daily.    [provider]  fexofenadine (ALLEGRA) 180 MG tablet Take 180 mg by mouth daily.    [provider]  Fezolinetant  (VEOZAH ) 45 MG TABS Take 1 tablet (45 mg total) by mouth daily. 09/15/23   Paulett Boros, MD  furosemide  (LASIX ) 40 MG tablet TAKE 1 AND 1/2 TABLETS(60 MG) BY MOUTH DAILY 02/07/23   Gerard Knight, MD  gabapentin  (NEURONTIN ) 100 MG capsule Take 1 capsule (100 mg total) by mouth 3 (three) times daily as needed. 10/12/23   Paulett Boros, MD  glimepiride  (AMARYL ) 4 MG tablet Take 4 mg by mouth daily with breakfast.    [provider]  hydrochlorothiazide (HYDRODIURIL) 25 MG tablet Take 25 mg by mouth daily. 06/24/23   [provider]  hydrOXYzine  (ATARAX ) 25 MG tablet TAKE 1 TABLET(25 MG) BY MOUTH DAILY 10/18/23   Paulett Boros, MD  isosorbide  mononitrate (IMDUR ) 30 MG 24 hr tablet Take 1 tablet (30 mg total) by mouth daily. 07/31/22   Colin Dawley, MD  losartan  (COZAAR ) 100 MG tablet Take 1 tablet by mouth daily. 11/24/22 11/24/23  [provider]  metoprolol  tartrate (LOPRESSOR ) 25 MG tablet Take 1 tablet (25 mg total) by mouth 2 (two) times daily. 11/23/21  Moira Andrews M, PA-C  nitroGLYCERIN  (NITROSTAT ) 0.4 MG SL tablet Place 1 tablet (0.4 mg total) under the tongue every 5 (five) minutes x 3 doses as needed for chest pain (if no relief after 3rd dose, proceed to ED or call 911). 07/08/22   Gerard Knight, MD  ondansetron  (ZOFRAN -ODT) 4 MG disintegrating tablet Take 1 tablet (4 mg total) by mouth every 8 (eight) hours as needed for vomiting or nausea. For 7 days supply 09/15/23   Katragadda, Sreedhar, MD  potassium chloride  SA (KLOR-CON  M) 20 MEQ tablet Take 20 mEq by mouth daily. 07/30/23   [provider]  pregabalin  (LYRICA ) 50 MG capsule Take 1  capsule (50 mg total) by mouth 3 (three) times daily as needed. 05/31/23   Phebe Brasil, MD  rosuvastatin  (CRESTOR ) 20 MG tablet Take 1 tablet (20 mg total) by mouth daily. 10/04/23   Gerard Knight, MD  vitamin B-12 (CYANOCOBALAMIN ) 1000 MCG tablet Take 1,000 mcg by mouth daily.    [provider]      Allergies    Latex    Review of Systems   Review of Systems  Physical Exam Updated Vital Signs BP 114/63 (BP Location: Right Arm)   Pulse 74   Temp (!) 97 F (36.1 C) (Oral)   Resp 17   Ht 5\' 4"  (1.626 m)   Wt 94.8 kg   SpO2 98%   BMI 35.87 kg/m  Physical Exam Vitals and nursing note reviewed.  Constitutional:      General: She is not in acute distress.    Appearance: She is well-developed.  HENT:     Head: Normocephalic and atraumatic.  Eyes:     Conjunctiva/sclera: Conjunctivae normal.  Cardiovascular:     Rate and Rhythm: Normal rate and regular rhythm.  Pulmonary:     Effort: Pulmonary effort is normal. No respiratory distress.     Breath sounds: Normal breath sounds. No stridor.  Abdominal:     General: There is no distension.  Skin:    General: Skin is warm and dry.  Neurological:     Mental Status: She is alert and oriented to person, place, and time.     Cranial Nerves: No cranial nerve deficit.  Psychiatric:        Mood and Affect: Mood normal.     ED Results / Procedures / Treatments   Labs (all labs ordered are listed, but only abnormal results are displayed) Labs Reviewed  BASIC METABOLIC PANEL WITH GFR - Abnormal; Notable for the following components:      Result Value   Glucose, Bld 123 (*)    BUN 28 (*)    Creatinine, Ser 1.96 (*)    Calcium  10.9 (*)    GFR, Estimated 27 (*)    All other components within normal limits  TROPONIN I (HIGH SENSITIVITY) - Abnormal; Notable for the following components:   Troponin I (High Sensitivity) 40 (*)    All other components within normal limits  TROPONIN I (HIGH SENSITIVITY) - Abnormal; Notable  for the following components:   Troponin I (High Sensitivity) 43 (*)    All other components within normal limits  CBC  LIPASE, BLOOD  HEPATIC FUNCTION PANEL    EKG EKG Interpretation Date/Time:  Monday October 24 2023 11:07:33 EDT Ventricular Rate:  74 PR Interval:  148 QRS Duration:  84 QT Interval:  386 QTC Calculation: 428 R Axis:   72  Text Interpretation: Normal sinus rhythm Low voltage QRS Nonspecific  T wave abnormality Confirmed by Iva Mariner [phone]) on 10/24/2023 1:20:04 PM  Radiology DG Chest 2 View Result Date: 10/24/2023 CLINICAL DATA:  Chest pain EXAM: CHEST - 2 VIEW COMPARISON:  06/27/2023 FINDINGS: Prior median sternotomy and CABG. Heart and mediastinal contours are within normal limits. No focal opacities or effusions. No acute bony abnormality. IMPRESSION: No active cardiopulmonary disease. Electronically Signed   By: Janeece Mechanic M.D.   On: 10/24/2023 12:48    Procedures Procedures    Medications Ordered in ED Medications  acetaminophen  (TYLENOL ) tablet 1,000 mg (has no administration in time range)    ED Course/ Medical Decision Making/ A&P                                 Medical Decision Making Elderly female with history of bypass within the past year presents with new chest pain in the context of exertion.  Symptoms may be musculoskeletal given her description of gardening, but with concern for angina which is different from baseline, particular given recent nitroglycerin  discussed her case with her cardiology colleagues.  Patient's troponin values are elevated, though steady across 2 values.  EKG nonischemic, there was T wave flattening in the inferior leads.  Patient admitted to the internal medicine team, cardiology follows a consulting service. Cardiac 75 sinus normal pulse ox 100% room air normal  Amount and/or Complexity of Data Reviewed Independent Historian:     Details: Adult child at bedside External Data Reviewed: notes.    Details:  Cardiology Labs:  Decision-making details documented in ED Course. Radiology: independent interpretation performed. Decision-making details documented in ED Course.    Details: Unremarkable ECG/medicine tests: independent interpretation performed. Decision-making details documented in ED Course.  Risk Decision regarding hospitalization. Diagnosis or treatment significantly limited by social determinants of health.  Final Clinical Impression(s) / ED Diagnoses Final diagnoses:  Unstable angina [facility])     Dorenda Gandy, MD 10/24/23 1426

## 2023-10-24 NOTE — ED Notes (Signed)
 Per Primary RN request, this writer attempted to establish IV access for the PT, however, Pt began twisting and pulling her arm and yelling at this RN. This writer is unable to establish IV access.

## 2023-10-24 NOTE — ED Notes (Addendum)
 Pt is L arm restricted per pt due to prior surgery

## 2023-10-24 NOTE — ED Provider Triage Note (Signed)
 Emergency Medicine Provider Triage Evaluation Note  Katelyn Kaufman , a 73 y.o. female  was evaluated in triage.  Pt complains of chest pain.  Pt reports she has been taking nitroglycerin  for the past 3 days   Review of Systems  Positive: Mid chest pain Negative: fever  Physical Exam  BP 117/83 (BP Location: Right Arm)   Pulse 72   Temp (!) 96.3 F (35.7 C) (Temporal)   Resp 17   Ht 5\' 4"  (1.626 m)   Wt 94.8 kg   SpO2 98%   BMI 35.87 kg/m  Gen:   Awake, no distress   Resp:  Normal effort  MSK:   Moves extremities without difficulty  Other:    Medical Decision Making  Medically screening exam initiated at 12:29 PM.  Appropriate orders placed.  Katelyn Kaufman was informed that the remainder of the evaluation will be completed by another provider, this initial triage assessment does not replace that evaluation, and the importance of remaining in the ED until their evaluation is complete.     Sandi Crosby, PA-C 10/24/23 1230

## 2023-10-24 NOTE — ED Notes (Signed)
 Per Dia Forget, RN, SWOT to attempt IV access when pt gets upstairs to room

## 2023-10-24 NOTE — H&P (Addendum)
 History and Physical   Patient: Katelyn Kaufman UEA:[id number] DOB: March 27, 1951 DOA: 10/24/2023 DOS: the patient was seen and examined on 10/24/2023 PCP: Hershel Los, DO  Patient coming from: Home  Chief Complaint:  Chief Complaint  Patient presents with   Chest Pain   HPI: Katelyn Kaufman is a 73 y.o. female with medical history significant of CAD, s/p CABG x 3 vessel (June 2023), ongoing tobacco abuse, COPD, Depression/Anxiety,HTN, HLD,DM2 who presents with intermittent chest pains over last 2 to 3 weeks, works over last 3 days. Mostly Left Parasternal. -Has been using Sublingual Nitroglycerin  and Tylenol  with mixed results.  C/o Headaches, and hot flashes No Leg pains, no leg swelling and No Pleuritic sxs. No fever  Or chills  No Nausea, Vomiting or Diarrhea C/o arthralgia  In ED EKG shows NSR, w/o acute changes, - Troponin 40, repeat 43 - Chest x-ray without acute findings - CBC WNL -Creatinine 1.96, creatinine was 1.54 on 08/23/2023 and 1.45 in November 2024 - A1c on 10/12/2023 was 7.5 - LFTs WNL  Review of Systems: As mentioned in the history of present illness. All other systems reviewed and are negative. 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   COPD (chronic obstructive pulmonary disease) (HCC)    Depression    Essential hypertension    Hyperlipidemia    Myocardial infarction (HCC)    [place] to the LAD   OSA (obstructive sleep apnea)    CPAP qHS   PONV (postoperative nausea and vomiting)    Type 2 diabetes mellitus (HCC)    Past Surgical History:  Procedure Laterality Date   BACK SURGERY     BREAST SURGERY     CATARACT EXTRACTION W/PHACO Right 10/24/2020   Procedure: CATARACT EXTRACTION PHACO AND INTRAOCULAR LENS  PLACEMENT (IOC);  Surgeon: Tarri Farm, MD;  Location: [facility];  Service: Ophthalmology;  Laterality: Right;  CDE: 10.87   CATARACT EXTRACTION W/PHACO Left 12/01/2020   Procedure: CATARACT EXTRACTION PHACO AND INTRAOCULAR LENS PLACEMENT LEFT EYE;  Surgeon: Tarri Farm, MD;  Location: [facility];  Service: Ophthalmology;  Laterality: Left;  CDE  8.62   CORONARY ARTERY BYPASS GRAFT N/A 11/16/2021   Procedure: CORONARY ARTERY BYPASS GRAFTING (CABG) times three using the left internal mammary and right saphenous vein.;  Surgeon: Bartley Lightning, MD;  Location: [facility] OR;  Service: Open Heart Surgery;  Laterality: N/A;   IABP INSERTION N/A 11/16/2021   Procedure: IABP Insertion;  Surgeon: Arleen Lacer, MD;  Location: [facility] INVASIVE CV LAB;  Service: Cardiovascular;  Laterality: N/A;   LEFT HEART CATH AND CORONARY ANGIOGRAPHY N/A 11/16/2021   Procedure: LEFT HEART CATH AND CORONARY ANGIOGRAPHY;  Surgeon: Arleen Lacer, MD;  Location: [facility] INVASIVE CV LAB;  Service: Cardiovascular;  Laterality: N/A;   REPLACEMENT TOTAL KNEE Right    SIMPLE MASTECTOMY WITH AXILLARY SENTINEL NODE BIOPSY Left 06/25/2022   Procedure: SIMPLE MASTECTOMY;  Surgeon: Alanda Allegra, MD;  Location: [facility];  Service: General;  Laterality: Left;   TEE WITHOUT CARDIOVERSION N/A 11/16/2021   Procedure: TRANSESOPHAGEAL ECHOCARDIOGRAM (TEE);  Surgeon: Bartley Lightning, MD;  Location: [facility] OR;  Service: Open Heart Surgery;  Laterality: N/A;   TUBAL LIGATION     WOUND EXPLORATION Left 07/26/2022  Procedure: EVACUATION OF HEMATOMA BREAST, DEBRIDEMENT OF LEFT BREAST WOUND;  Surgeon: Alanda Allegra, MD;  Location: [facility];  Service: General;  Laterality: Left;   Social History:  reports that she has been smoking cigarettes. She has a 20 pack-year smoking history. She has never used smokeless tobacco. She reports that she does not drink alcohol and does not use drugs.  Allergies  Allergen Reactions   Latex Rash    Family History  Problem Relation Age  of Onset   COPD Mother    Alcohol abuse Father    Hypertension Sister    Heart attack Brother     Prior to Admission medications   Medication Sig Start Date End Date Taking? Authorizing Provider  ACCU-CHEK GUIDE TEST test strip USE TO TEST BLOOD SUGAR UP TO FOUR TIMES DAILY(IN THE MORNING BEFORE BREAKFAST AND 2 HOURS AFTER EACH MEAL). 08/19/23   [provider]  acetaminophen  (TYLENOL ) 325 MG tablet Take 2 tablets (650 mg total) by mouth every 6 (six) hours as needed for mild pain (or Fever >/= 101). 07/30/22   Colin Dawley, MD  anastrozole  (ARIMIDEX ) 1 MG tablet TAKE 1 TABLET(1 MG) BY MOUTH DAILY 06/06/23   Paulett Boros, MD  Ascorbic Acid  (VITAMIN C ) 1000 MG tablet Take 1,000 mg by mouth daily.    [provider]  aspirin  EC 81 MG tablet Take 1 tablet (81 mg total) by mouth daily. Swallow whole. 11/23/21   Zimmerman, Donielle M, PA-C  buprenorphine (SUBUTEX) 2 MG SUBL SL tablet SMARTSIG:2 Tablet(s) Sublingual Every 8 Hours 03/24/23   [provider]  celecoxib  (CELEBREX ) 50 MG capsule TAKE 1 CAPSULE(50 MG) BY MOUTH TWICE DAILY AS NEEDED FOR PAIN 09/20/23   Phebe Brasil, MD  cholecalciferol  (VITAMIN D3) 25 MCG (1000 UNIT) tablet Take 1,000 Units by mouth daily.    [provider]  cyclobenzaprine  (FLEXERIL ) 10 MG tablet Take 10 mg by mouth 2 (two) times daily as needed for muscle spasms. 07/17/20   [provider]  DULoxetine  (CYMBALTA ) 60 MG capsule Take 60 mg by mouth daily.    [provider]  fexofenadine (ALLEGRA) 180 MG tablet Take 180 mg by mouth daily.    [provider]  Fezolinetant  (VEOZAH ) 45 MG TABS Take 1 tablet (45 mg total) by mouth daily. 09/15/23   Paulett Boros, MD  furosemide  (LASIX ) 40 MG tablet TAKE 1 AND 1/2 TABLETS(60 MG) BY MOUTH DAILY 02/07/23   Gerard Knight, MD  gabapentin  (NEURONTIN ) 100 MG capsule Take 1 capsule (100 mg total) by mouth 3 (three) times daily as needed. 10/12/23   Paulett Boros, MD  glimepiride  (AMARYL ) 4 MG tablet Take 4 mg by mouth daily with breakfast.    [provider]  hydrochlorothiazide (HYDRODIURIL) 25 MG tablet Take 25 mg by mouth daily. 06/24/23   [provider]  hydrOXYzine  (ATARAX ) 25 MG tablet TAKE 1 TABLET(25 MG) BY MOUTH DAILY 10/18/23   Paulett Boros, MD  isosorbide  mononitrate (IMDUR ) 30 MG 24 hr tablet Take 1 tablet (30 mg total) by mouth daily. 07/31/22   Colin Dawley, MD  losartan  (COZAAR ) 100 MG tablet Take 1 tablet by mouth daily. 11/24/22 11/24/23  [provider]  metoprolol  tartrate (LOPRESSOR ) 25 MG tablet Take 1 tablet (25 mg total) by mouth 2 (two) times daily. 11/23/21   Allegra Arch, PA-C  nitroGLYCERIN  (NITROSTAT ) 0.4 MG SL tablet Place 1 tablet (0.4 mg total) under the tongue every 5 (five) minutes x 3 doses as needed for  chest pain (if no relief after 3rd dose, proceed to ED or call 911). 07/08/22   Gerard Knight, MD  ondansetron  (ZOFRAN -ODT) 4 MG disintegrating tablet Take 1 tablet (4 mg total) by mouth every 8 (eight) hours as needed for vomiting or nausea. For 7 days supply 09/15/23   Katragadda, Sreedhar, MD  potassium chloride  SA (KLOR-CON  M) 20 MEQ tablet Take 20 mEq by mouth daily. 07/30/23   [provider]  pregabalin  (LYRICA ) 50 MG capsule Take 1 capsule (50 mg total) by mouth 3 (three) times daily as needed. 05/31/23   Phebe Brasil, MD  rosuvastatin  (CRESTOR ) 20 MG tablet Take 1 tablet (20 mg total) by mouth daily. 10/04/23   Gerard Knight, MD  vitamin B-12 (CYANOCOBALAMIN ) 1000 MCG tablet Take 1,000 mcg by mouth daily.    [provider]   Physical Exam: Vitals:   10/24/23 1342 10/24/23 1405 10/24/23 1418 10/24/23 1559  BP:   114/63 135/74  Pulse: 78 74 76 77  Resp: 17 16 17    Temp:   (!) 97 F (36.1 C) 98 F (36.7 C)  TempSrc:   Temporal Oral  SpO2: 96% 99% 98% 97%  Weight:    94.8 kg  Height:    5\' 4"  (1.626 m)   Physical Exam Gen:- Awake Alert, in  no acute distress  HEENT:- [place].AT, No sclera icterus Neck-Supple Neck,No JVD,.  Lungs-  CTAB , fair air movement bilaterally  CV- S1, S2 normal, RRR, prior sternotomy scar Abd-  +ve B.Sounds, Abd Soft, No tenderness,    Extremity/Skin:- No  edema,   good pedal pulses  Psych-affect is appropriate, oriented x3 Neuro-no new focal deficits, no tremors  Data Reviewed: EKG shows NSR, w/o acute changes, - Troponin 40, repeat 43 - Chest x-ray without acute findings - CBC WNL -Creatinine 1.96, creatinine was 1.54 on 08/23/2023 and 1.45 in November 2024 - A1c on 10/12/2023 was 7.5 - LFTs WNL  Assessment and Plan: 1)Atypical Chest Pain-  CAD, s/p CABG x 3 vessel (June 2023) -Place in Observation status on  telemetry monitored unit, check serial troponins and EKG to rule out acute coronary syndrome .  If patient rules out for ACS, will need further cardiovascular risk stratification . Cardiology consult to help decide further CV diagnostic modalities.   Give aspirin , nitroglycerin /isosorbide  , Crestor  and metoprolol .  -- Repeat/updated echo pending  2)Tobacco Abuse---  Smoking cessation advised Give Nicotine  patch  3)DM2-- - A1c on 10/12/2023 was 7.5 Hold metformin  Use Novolog /Humalog Sliding scale insulin  with Accu-Cheks/Fingersticks as ordered   4)HTN--- continue losartan , and metoprolol , as well as isosorbide   5)Depression/Anxiety--- continue Cymbalta   6) chronic pain syndrome--continue Subutex, Cymbalta  and gabapentin   7)History of left-sided breast cancer--- continue Arimidex   8)Class 2 Obesity- -Low calorie diet, portion control and increase physical activity discussed with patient -Body mass index is 35.87 kg/m.    Advance Care Planning:   Code Status: Full Code   Consults: Cardiology  Family Communication: spoke with her room mate at bedside  Severity of Illness: The appropriate patient status for this patient is OBSERVATION. Observation status is judged to be reasonable  and necessary in order to provide the required intensity of service to ensure the patient's safety. The patient's presenting symptoms, physical exam findings, and initial radiographic and laboratory data in the context of their medical condition is felt to place them at decreased risk for further clinical deterioration. Furthermore, it is anticipated that the patient will be medically stable for discharge from the  hospital within 2 midnights of admission.   Author: Colin Dawley, MD 10/24/2023 6:00 PM  For on call review [email or web address].

## 2023-10-24 NOTE — Telephone Encounter (Signed)
 Left message for patient to return our call if she still needs assistance

## 2023-10-24 NOTE — ED Notes (Signed)
 Pt ambulated to the BR with standby assistance

## 2023-10-24 NOTE — ED Triage Notes (Addendum)
 Pt BIB [facility] c/o chest pain x a couple of days with n/v, per EMS v/s and EKG WNL, pt reports working in her garden and concerned she overdid it

## 2023-10-24 NOTE — Telephone Encounter (Signed)
 Patient states the left side of her chest has given her a little trouble she states it is sore  She has taken NTG  She has been having headaches, chest pain and hot flashes.  NTG and tylenol  has not helped.  This has been happening for 2-3 weeks  Patient is not able to check BP at home.  Patient feels like she really needs to be seems .    SENT to [facility] Dr.Branch and Dr.Mallipeddi  They stated advise patient to go to [facility]  Patient informed and verbalized understanding of plan. Patient sister is taking her to ED

## 2023-10-24 NOTE — ED Notes (Signed)
 Per Dee Farber, RN, Pt refusing to allow this RN to establish IV access. She only has the right arm to attempt access on, and is unable to tolerate the pain. No IV access is established at this time and she already has an admission bed.

## 2023-10-25 ENCOUNTER — Observation Stay (HOSPITAL_COMMUNITY)

## 2023-10-25 ENCOUNTER — Other Ambulatory Visit (HOSPITAL_COMMUNITY): Payer: Self-pay | Admitting: *Deleted

## 2023-10-25 DIAGNOSIS — N179 Acute kidney failure, unspecified: Secondary | ICD-10-CM | POA: Diagnosis not present

## 2023-10-25 DIAGNOSIS — R079 Chest pain, unspecified: Secondary | ICD-10-CM

## 2023-10-25 DIAGNOSIS — E66812 Obesity, class 2: Secondary | ICD-10-CM | POA: Diagnosis not present

## 2023-10-25 DIAGNOSIS — Z951 Presence of aortocoronary bypass graft: Secondary | ICD-10-CM | POA: Diagnosis not present

## 2023-10-25 DIAGNOSIS — R0789 Other chest pain: Secondary | ICD-10-CM | POA: Diagnosis not present

## 2023-10-25 DIAGNOSIS — N189 Chronic kidney disease, unspecified: Secondary | ICD-10-CM | POA: Diagnosis not present

## 2023-10-25 DIAGNOSIS — I10 Essential (primary) hypertension: Secondary | ICD-10-CM | POA: Diagnosis not present

## 2023-10-25 LAB — ECHOCARDIOGRAM COMPLETE
AR max vel: 2.29 cm2
AV Area VTI: 2.47 cm2
AV Area mean vel: 2.65 cm2
AV Mean grad: 1.7 mmHg
AV Peak grad: 4 mmHg
Ao pk vel: 1 m/s
Area-P 1/2: 1.57 cm2
Height: 64 in
S' Lateral: 2.35 cm
Weight: 3343.94 [oz_av]

## 2023-10-25 LAB — GLUCOSE, CAPILLARY
Glucose-Capillary: 126 mg/dL — ABNORMAL HIGH (ref 70–99)
Glucose-Capillary: 152 mg/dL — ABNORMAL HIGH (ref 70–99)
Glucose-Capillary: 166 mg/dL — ABNORMAL HIGH (ref 70–99)
Glucose-Capillary: 223 mg/dL — ABNORMAL HIGH (ref 70–99)

## 2023-10-25 LAB — RENAL FUNCTION PANEL
Albumin: 3.5 g/dL (ref 3.5–5.0)
Anion gap: 13 (ref 5–15)
BUN: 29 mg/dL — ABNORMAL HIGH (ref 8–23)
CO2: 21 mmol/L — ABNORMAL LOW (ref 22–32)
Calcium: 10 mg/dL (ref 8.9–10.3)
Chloride: 96 mmol/L — ABNORMAL LOW (ref 98–111)
Creatinine, Ser: 1.9 mg/dL — ABNORMAL HIGH (ref 0.44–1.00)
GFR, Estimated: 28 mL/min — ABNORMAL LOW (ref >60)
Glucose, Bld: 136 mg/dL — ABNORMAL HIGH (ref 70–99)
Phosphorus: 3.5 mg/dL (ref 2.5–4.6)
Potassium: 3.5 mmol/L (ref 3.5–5.1)
Sodium: 130 mmol/L — ABNORMAL LOW (ref 135–145)

## 2023-10-25 LAB — TSH: TSH: 4.238 u[IU]/mL (ref 0.350–4.500)

## 2023-10-25 LAB — MAGNESIUM: Magnesium: 1.7 mg/dL (ref 1.7–2.4)

## 2023-10-25 LAB — TROPONIN I (HIGH SENSITIVITY): Troponin I (High Sensitivity): 36 ng/L — ABNORMAL HIGH (ref <18)

## 2023-10-25 MED ORDER — EZETIMIBE 10 MG PO TABS
10.0000 mg | ORAL_TABLET | Freq: Every day | ORAL | Status: DC
  Administered 2023-10-25 – 2023-10-27 (×3): 10 mg via ORAL
  Filled 2023-10-25 (×3): qty 1

## 2023-10-25 MED ORDER — ORAL CARE MOUTH RINSE: 15.0000 mL | OROMUCOSAL | Status: DC | PRN

## 2023-10-25 MED ORDER — MIDODRINE HCL 5 MG PO TABS
10.0000 mg | ORAL_TABLET | Freq: Once | ORAL | Status: AC
  Administered 2023-10-25: 10 mg via ORAL
  Filled 2023-10-25: qty 2

## 2023-10-25 MED ORDER — METOPROLOL TARTRATE 25 MG PO TABS: 25.0000 mg | ORAL_TABLET | Freq: Two times a day (BID) | ORAL | Status: DC

## 2023-10-25 MED ORDER — LOSARTAN POTASSIUM 50 MG PO TABS: 50.0000 mg | ORAL_TABLET | Freq: Every day | ORAL | Status: DC

## 2023-10-25 NOTE — Plan of Care (Signed)
   Problem: Education: Goal: Knowledge of General Education information will improve Description: Including pain rating scale, medication(s)/side effects and non-pharmacologic comfort measures Outcome: Progressing   Problem: Clinical Measurements: Goal: Ability to maintain clinical measurements within normal limits will improve Outcome: Progressing Goal: Diagnostic test results will improve Outcome: Progressing

## 2023-10-25 NOTE — Care Management Obs Status (Deleted)
 MEDICARE OBSERVATION STATUS NOTIFICATION   Patient Details  Name: GRACIA SAGGESE MRN: [id number] Date of Birth: 09-30-1950   Medicare Observation Status Notification Given:  Yes    Neila Bally 10/25/2023, 2:40 PM

## 2023-10-25 NOTE — Progress Notes (Signed)
*  PRELIMINARY RESULTS* Echocardiogram 2D Echocardiogram has been performed.  Katelyn Kaufman 10/25/2023, 12:47 PM

## 2023-10-25 NOTE — Progress Notes (Signed)
   10/25/23 1015  TOC Brief Assessment  Insurance and Status Reviewed  Patient has primary care physician Yes  Home environment has been reviewed Home with non family member  Prior level of function: Independent  Prior/Current Home Services No current home services  Social Drivers of Health Review SDOH reviewed no interventions necessary  Readmission risk has been reviewed Yes  Transition of care needs no transition of care needs at this time   Smoking cessation added to AVS.  Transition of Care Department (TOC) has reviewed patient and no TOC needs have been identified at this time. We will continue to monitor patient advancement through interdisciplinary progression rounds. If new patient transition needs arise, please place a TOC consult.

## 2023-10-25 NOTE — Consult Note (Addendum)
 Cardiology Consultation   Patient ID: Katelyn Kaufman MRN: [id number]; DOB: 1950-08-07  Admit date: 10/24/2023 Date of Consult: 10/25/2023  PCP:  Katelyn Los, DO   [facility] Providers Cardiologist:  Katelyn Favre, MD     Patient Profile: Katelyn Kaufman is a 73 y.o. female with a hx of CAD (s/p CABG in 10/2021 LIMA to LAD, SVG to OM, and SVG to ramus intermedius), HTN, HLD, OSA, DM2, breast cancer s/p L mastectomy, depression/anxiety, chronic pain syndrome who is being seen 10/24/2023 for the evaluation of chest pain at the request of Dr. Quintella Kaufman.    History of Present Illness: [name] was last seen in heart care OV 10/04/2023 with Dr. Londa Kaufman for follow up.  At that time, denied any angina, DOE, palpitations, syncope or interval use of nitroglycerin .  EKG reviewed from 03/2023 noted NSR. Noted issues with balance and memory defects with upcoming neurology appointment.  Discontinued Plavix  and continued on ASA 81 mg.  Increased Crestor  to 20 mg.  Continued on Imdur  30 mg daily, Lopressor  25 mg twice daily, HCTZ 25 mg daily and Cozaar  100 mg daily   Presented to ED 6/2 with concerns of chest pain.  K 4.1, CR 1.96 (baseline Cr 1.4-1.5), AST/ALT WNL, Tn 40 > 43 > 36, CBC WNL,  CXR with no active cardiopulmonary disease.  EKG: NSR, HR 74, nonspecific T wave abnormalities ECHO pending ED treated with DVT prophylaxis heparin .    On interview, reported CP starting 4-5 day ago while working in the garden, 8/10, located mid sternal, described as achy, lasting from 10 to 30 min, not as severe as previous CP with CABG, non postional, non pleurtic. Report 3-4 more similar episodes with varying duration that occurred associated with exertion. Some relief with NTG. However, yesterday tried 4 NTG with no relief. Recently started back doing house work and yard work. Suspect pulling a muscle during this activity. Chronic SOB x years that has not changed associated house work (sweeping,  mopping, gardening), relieved with rest.  Reports palpitation described as heart racing x 3-4 days, lasting 15-20 mins, associated with exertion, relieved with rest. Notes sleeping at incline with 1 pillow due to chronic pain. Denied any edema, syncope, orthopnea, PND. Reports medication compliance. Reports healthy diet. Reports 2 bottles of water , 8 oz soda, 2 cups of sweet tea, 1 cup of coffee. Stays at home with friend from church who assist with ADLS. Unable to care for herself due to pain and arthritis. Smoke 1/2 PPD for 30 years. Denies etoh or drugs. Reports recently going to see new nephrologist in [place], "Katelyn Kaufman," with next follow up this month.   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   COPD (chronic obstructive pulmonary disease) (HCC)    Depression    Essential hypertension    Hyperlipidemia    Myocardial infarction (HCC)    [place] to the LAD   OSA (obstructive sleep apnea)    CPAP qHS   PONV (postoperative nausea and vomiting)    Type 2 diabetes mellitus [facility])     Past Surgical History:  Procedure Laterality Date   BACK SURGERY     BREAST SURGERY     CATARACT EXTRACTION W/PHACO Right 10/24/2020  Procedure: CATARACT EXTRACTION PHACO AND INTRAOCULAR LENS PLACEMENT (IOC);  Surgeon: Katelyn Farm, MD;  Location: [facility];  Service: Ophthalmology;  Laterality: Right;  CDE: 10.87   CATARACT EXTRACTION W/PHACO Left 12/01/2020   Procedure: CATARACT EXTRACTION PHACO AND INTRAOCULAR LENS PLACEMENT LEFT EYE;  Surgeon: Katelyn Farm, MD;  Location: [facility];  Service: Ophthalmology;  Laterality: Left;  CDE  8.62   CORONARY ARTERY BYPASS GRAFT N/A 11/16/2021   Procedure: CORONARY ARTERY BYPASS GRAFTING (CABG) times three using the left internal  mammary and right saphenous vein.;  Surgeon: Katelyn Lightning, MD;  Location: [facility] OR;  Service: Open Heart Surgery;  Laterality: N/A;   IABP INSERTION N/A 11/16/2021   Procedure: IABP Insertion;  Surgeon: Katelyn Lacer, MD;  Location: [facility] INVASIVE CV LAB;  Service: Cardiovascular;  Laterality: N/A;   LEFT HEART CATH AND CORONARY ANGIOGRAPHY N/A 11/16/2021   Procedure: LEFT HEART CATH AND CORONARY ANGIOGRAPHY;  Surgeon: Katelyn Lacer, MD;  Location: [facility] INVASIVE CV LAB;  Service: Cardiovascular;  Laterality: N/A;   REPLACEMENT TOTAL KNEE Right    SIMPLE MASTECTOMY WITH AXILLARY SENTINEL NODE BIOPSY Left 06/25/2022   Procedure: SIMPLE MASTECTOMY;  Surgeon: Katelyn Allegra, MD;  Location: [facility];  Service: General;  Laterality: Left;   TEE WITHOUT CARDIOVERSION N/A 11/16/2021   Procedure: TRANSESOPHAGEAL ECHOCARDIOGRAM (TEE);  Surgeon: Katelyn Lightning, MD;  Location: [facility] OR;  Service: Open Heart Surgery;  Laterality: N/A;   TUBAL LIGATION     WOUND EXPLORATION Left 07/26/2022   Procedure: EVACUATION OF HEMATOMA BREAST, DEBRIDEMENT OF LEFT BREAST WOUND;  Surgeon: Katelyn Allegra, MD;  Location: [facility];  Service: General;  Laterality: Left;     Home Medications:  Prior to Admission medications   Medication Sig Start Date End Date Taking? Authorizing Provider  acetaminophen  (TYLENOL ) 325 MG tablet Take 2 tablets (650 mg total) by mouth every 6 (six) hours as needed for mild pain (or Fever >/= 101). 07/30/22  Yes Kaufman, Courage, MD  anastrozole  (ARIMIDEX ) 1 MG tablet TAKE 1 TABLET(1 MG) BY MOUTH DAILY Patient taking differently: Take 1 mg by mouth daily. 06/06/23  Yes Katelyn Boros, MD  Ascorbic Acid  (VITAMIN C ) 1000 MG tablet Take 1,000 mg by mouth daily.   Yes [provider]  aspirin  81 MG chewable tablet Chew 405 mg by mouth once. For chest pain   Yes [provider]  aspirin  EC 81 MG tablet Take 1 tablet (81 mg total) by mouth daily. Swallow whole. 11/23/21  Yes Katelyn Kaufman  M, PA-C  buprenorphine (SUBUTEX) 2 MG SUBL SL tablet Place 2 mg under the tongue daily. 03/24/23  Yes [provider]  celecoxib  (CELEBREX ) 50 MG capsule TAKE 1 CAPSULE(50 MG) BY MOUTH TWICE DAILY AS NEEDED FOR PAIN Patient taking differently: Take 50 mg by mouth 2 (two) times daily as needed for pain. 09/20/23  Yes Phebe Brasil, MD  cholecalciferol  (VITAMIN D3) 25 MCG (1000 UNIT) tablet Take 1,000 Units by mouth daily.   Yes [provider]  clopidogrel  (PLAVIX ) 75 MG tablet Take 75 mg by mouth in the morning.   Yes [provider]  cyclobenzaprine  (FLEXERIL ) 10 MG tablet Take 10 mg by mouth 2 (two) times daily as needed for muscle spasms. 07/17/20  Yes [provider]  DULoxetine  (CYMBALTA ) 60 MG capsule Take 60 mg by mouth daily.   Yes [provider]  fexofenadine (Kaufman) 180 MG tablet Take 180 mg by mouth daily.   Yes [provider]  furosemide  (  LASIX ) 40 MG tablet TAKE 1 AND 1/2 TABLETS(60 MG) BY MOUTH DAILY Patient taking differently: Take 60 mg by mouth daily. 02/07/23  Yes Gerard Knight, MD  gabapentin  (NEURONTIN ) 100 MG capsule Take 1 capsule (100 mg total) by mouth 3 (three) times daily as needed. 10/12/23  Yes Katelyn Boros, MD  glimepiride  (AMARYL ) 4 MG tablet Take 4 mg by mouth daily with breakfast.   Yes [provider]  hydrochlorothiazide (HYDRODIURIL) 25 MG tablet Take 25 mg by mouth daily. 06/24/23  Yes [provider]  hydrOXYzine  (ATARAX ) 25 MG tablet TAKE 1 TABLET(25 MG) BY MOUTH DAILY Patient taking differently: Take 25 mg by mouth daily. 10/18/23  Yes Katelyn Boros, MD  losartan  (COZAAR ) 100 MG tablet Take 1 tablet by mouth daily. 11/24/22 11/24/23 Yes [provider]  metFORMIN  (GLUCOPHAGE -XR) 500 MG 24 hr tablet Take 1,000 mg by mouth 2 (two) times daily with a meal.   Yes [provider]  metoprolol  tartrate (LOPRESSOR ) 25 MG tablet Take 1 tablet (25 mg total) by mouth 2  (two) times daily. 11/23/21  Yes Katelyn Kaufman M, PA-C  nitroGLYCERIN  (NITROSTAT ) 0.4 MG SL tablet Place 1 tablet (0.4 mg total) under the tongue every 5 (five) minutes x 3 doses as needed for chest pain (if no relief after 3rd dose, proceed to ED or call 911). 07/08/22  Yes Gerard Knight, MD  ondansetron  (ZOFRAN -ODT) 4 MG disintegrating tablet Take 1 tablet (4 mg total) by mouth every 8 (eight) hours as needed for vomiting or nausea. For 7 days supply 09/15/23  Yes Katelyn Boros, MD  potassium chloride  SA (KLOR-CON  M) 20 MEQ tablet Take 20 mEq by mouth daily. 07/30/23  Yes [provider]  pregabalin  (LYRICA ) 50 MG capsule Take 1 capsule (50 mg total) by mouth 3 (three) times daily as needed. 05/31/23  Yes Phebe Brasil, MD  rosuvastatin  (CRESTOR ) 20 MG tablet Take 1 tablet (20 mg total) by mouth daily. 10/04/23  Yes Gerard Knight, MD  vitamin Kaufman-12 (CYANOCOBALAMIN ) 1000 MCG tablet Take 1,000 mcg by mouth daily.   Yes [provider]  Fezolinetant  (VEOZAH ) 45 MG TABS Take 1 tablet (45 mg total) by mouth daily. Patient not taking: Reported on 10/24/2023 09/15/23   Katelyn Boros, MD  isosorbide  mononitrate (IMDUR ) 30 MG 24 hr tablet Take 1 tablet (30 mg total) by mouth daily. 07/31/22   Colin Dawley, MD    Scheduled Meds:  acetaminophen   1,000 mg Oral Once   anastrozole   1 mg Oral Daily   aspirin  EC  81 mg Oral Q breakfast   buprenorphine  2 mg Sublingual Q12H   DULoxetine   60 mg Oral Daily   ezetimibe   10 mg Oral Daily   furosemide   60 mg Oral Daily   gabapentin   100 mg Oral BID   heparin   5,000 Units Subcutaneous Q8H   insulin  aspart  0-5 Units Subcutaneous QHS   insulin  aspart  0-6 Units Subcutaneous TID WC   isosorbide  mononitrate  30 mg Oral Daily   losartan   100 mg Oral Daily   metoprolol  tartrate  25 mg Oral BID   nicotine   14 mg Transdermal Daily   rosuvastatin   20 mg Oral Daily   sodium chloride  flush  3 mL Intravenous Q12H   sodium chloride   flush  3 mL Intravenous Q12H   Continuous Infusions:  sodium chloride      PRN Meds: sodium chloride , acetaminophen  **OR** acetaminophen , bisacodyl , hydrOXYzine , nitroGLYCERIN , ondansetron  **OR** ondansetron  (ZOFRAN ) IV, polyethylene  glycol, sodium chloride  flush, traZODone  Allergies:    Allergies  Allergen Reactions   Latex Rash    Social History:   Social History   Socioeconomic History   Marital status: Widowed    Spouse name: Not on file   Number of children: Not on file   Years of education: Not on file   Highest education level: Not on file  Occupational History   Not on file  Tobacco Use   Smoking status: Every Day    Current packs/day: 0.50    Average packs/day: 0.5 packs/day for 40.0 years (20.0 ttl pk-yrs)    Types: Cigarettes   Smokeless tobacco: Never  Vaping Use   Vaping status: Never Used  Substance and Sexual Activity   Alcohol use: Never   Drug use: Never   Sexual activity: Not Currently  Other Topics Concern   Not on file  Social History Narrative   Not on file     Family History:   Family History  Problem Relation Age of Onset   COPD Mother    Alcohol abuse Father    Hypertension Sister    Heart attack Brother      ROS:  Please see the history of present illness.  All other ROS reviewed and negative.     Physical Exam/Data: Vitals:   10/24/23 1559 10/24/23 1945 10/25/23 0029 10/25/23 0425  BP: 135/74 117/72 96/85 103/62  Pulse: 77 77 65 (!) 52  Resp:  20 20 20   Temp: 98 F (36.7 C) 99 F (37.2 C) 98.2 F (36.8 C) 97.8 F (36.6 C)  TempSrc: Oral Oral Oral Oral  SpO2: 97% 99% 99% 91%  Weight: 94.8 kg     Height: 5\' 4"  (1.626 m)      No intake or output data in the 24 hours ending 10/25/23 0842    10/24/2023    3:59 PM 10/24/2023   11:05 AM 10/04/2023   11:21 AM  Last 3 Weights  Weight (lbs) 208 lb 15.9 oz 209 lb 209 lb  Weight (kg) 94.8 kg 94.802 kg 94.802 kg     Body mass index is 35.87 kg/m.  General:  Well nourished,  well developed, in no acute distress HEENT: normal Neck: no JVD Vascular: No carotid bruits; Distal pulses 2+ bilaterally Cardiac:  normal S1, S2; RRR; no murmur, reproducible CP on Left side of chest Lungs:  clear to auscultation bilaterally, no wheezing, rhonchi or rales  Abd: soft, nontender, no hepatomegaly  Ext: no edema Musculoskeletal:  No deformities, BUE and BLE strength normal and equal Skin: warm and dry  Neuro:  CNs 2-12 intact, no focal abnormalities noted Psych:  Normal affect   EKG:  The EKG was personally reviewed and demonstrates:   NSR, HR 74, nonspecific T wave abnormalities Telemetry:  Telemetry was personally reviewed and demonstrates:  NSR, HR 60-70's with PAC/PVC  Relevant CV Studies: ECHO TEE 11/16/2021 POST-OP IMPRESSIONS  _ Left Ventricle: has hyperdynamic systolic function, with an ejection  fraction  of 70%. The cavity size was normal. The wall motion is normal.  _ Right Ventricle: normal function. The cavity was normal. The wall motion  is  normal.  _ Aorta: there is no dissection present in the aorta.  _ Aortic Valve: The aortic valve appears unchanged from pre-bypass.  _ Mitral Valve: There is mild regurgitation.  _ Tricuspid Valve: There is mild regurgitation.   PRE-OP FINDINGS   Left Ventricle: The left ventricle has hyperdynamic systolic function,  with  an ejection fraction of >65%. The cavity size was normal. No evidence  of left ventricular regional wall motion abnormalities. There is severe  concentric left ventricular  hypertrophy.   CATH 10/2021   Mid LM to Prox LAD lesion is 95% stenosed with 99% stenosed side Euretha Najarro in Ost Cx.  Ramus lesion is 95% stenosed. - Trifucation lesion   Mid LAD stent is 5% stenosed.  Dist LAD stent is 10% stenosed.   Prox RCA lesion is 45% stenosed.   The left ventricular systolic function is normal.  The left ventricular ejection fraction is 50-55% by visual estimate.   There is no aortic valve stenosis.    ----------------------- Severe distal LM-trifurcation LAD, RI, LCx 95-99 % eccentric stenosis with minimal downstream disease. LAD has mid and distal vessel stent with diffuse calcification.  Stents are widely patent.  LAD reaches the apex.  1 very proximal ramus like small diagonal Fitzroy Mikami followed by 2 additional diagonal branches.  Too small for bypass. Ramus or medius is a large-caliber vessel that reaches the apex with distal branches. LCx courses as of the lateral OM/LPL distally after giving rise to small to moderate-sized OM1, OM 2 and OM 4 with minuscule OM 3.  Too small for grafting. RCA has proximal eccentric 45 to 50% stenosis with minimal distal disease giving rise to RPDA and 2 PL branches.  No significant disease in the RCA system. Preserved LVEF with mild mid to apical anterior hypokinesis. Hemodynamics:  Central AoP: 176/89 with a MAP 124 mmHg LVP/EDP: 172/5-15 mmHg   Recommendations: Urgent/emergent CVTS consultation with Dr. Sherene Dilling performed in the Cath Lab.  Plan is for emergent CABG Continue IABP via RFA Radial sheath sutured in place to be used for arterial line during procedure.  Removal in the CVICU post CABG with TR band placed by catheter supervisor.  Laboratory Data: High Sensitivity Troponin:   Recent Labs  Lab 10/24/23 1127 10/24/23 1322 10/25/23 0439  TROPONINIHS 40* 43* 36*     Chemistry Recent Labs  Lab 10/24/23 1127  NA 137  K 4.1  CL 101  CO2 24  GLUCOSE 123*  BUN 28*  CREATININE 1.96*  CALCIUM  10.9*  GFRNONAA 27*  ANIONGAP 12    Recent Labs  Lab 10/24/23 1322  PROT 8.0  ALBUMIN  4.3  AST 28  ALT 26  ALKPHOS 66  BILITOT 0.5   Lipids No results for input(s): "CHOL", "TRIG", "HDL", "LABVLDL", "LDLCALC", "CHOLHDL" in the last 168 hours.  Hematology Recent Labs  Lab 10/24/23 1127  WBC 6.4  RBC 4.67  HGB 14.8  HCT 43.5  MCV 93.1  MCH 31.7  MCHC 34.0  RDW 13.9  PLT 229   Thyroid  No results for input(s): "TSH", "FREET4" in  the last 168 hours.  BNPNo results for input(s): "BNP", "PROBNP" in the last 168 hours.  DDimer No results for input(s): "DDIMER" in the last 168 hours.  Radiology/Studies:  DG Chest 2 View Result Date: 10/24/2023 CLINICAL DATA:  Chest pain EXAM: CHEST - 2 VIEW COMPARISON:  06/27/2023 FINDINGS: Prior median sternotomy and CABG. Heart and mediastinal contours are within normal limits. No focal opacities or effusions. No acute bony abnormality. IMPRESSION: No active cardiopulmonary disease. Electronically Signed   By: Janeece Mechanic M.D.   On: 10/24/2023 12:48     Assessment and Plan: CAD s/p CABG  HLD Chest pain - CABG in June 2023 including LIMA to LAD, SVG to OM, and SVG to ramus intermedius  - ECHO 10/2021: EF 70%,  mild MVR/TVR. ECHO pending - 06/2023 LDL 88, Goal <55 - per chart review, Dr. Londa Kaufman d/c plavix  09/2023.  -  Reports multiple CP episode x 4-5 days associated with exertion, "achy", 8/10, lasting 10-30 mins, not as severe as CABG episode. However, CP was reproducible on exam with palpations. Noted recent physical over exertion with yard work.  Tn 40 > 43 > 36 - Would not consider for cath at this time based on mixed features, EKG without ischemia, flat troponin, renal dysfunction.  - Also, discussed Lexiscan  - Continue DVT prophylaxis heparin , ASA 81 mg, Crestor  20 mg, Lopressor  25 mg twice daily, losartan  100 mg, Imdur  30 mg - Discussed increasing statin to reach goal LDL but patient wants to avoid anything that may worst body aches. She would prefer to try Zetia . Ordered Zetia  10 mg.  - Also discussed increasing imdur  dose and having to decrease Losartan  dose.  - Suspect 2/2 to demand ischemia and MSK CP.   Palpitations  PVC/PAC  - Reports palpitation described as heart racing x 3-4 days, lasting 15-20 mins, associated with exertion, relieved with rest. - K 4.1, CBC WNL, MG/TSH ordered - Telemetry: NSR, HR 60-70's, PACs/PVCs - Continue Lopressor  as above - Continue to  monitor  - can consider outpatient monitor    HTN  - BP on the soft side: 103/62  - Home meds: Lopressor  25 mg twice daily, HCTZ 25 mg daily and Cozaar  100 mg daily - Continue Lopressor  25 mg twice daily, losartan  100 mg  DM2  - 10/2023 A1C 8.3 - managed by primary   Tobacco Use  - smoke 1/2 PPD for 30 years - interested in cessation but has been unable to stop    Risk Assessment/Risk Scores:  TIMI Risk Score for Unstable Angina or Non-ST Elevation MI:   The patient's TIMI risk score is 6, which indicates a 41% risk of all cause mortality, new or recurrent myocardial infarction or need for urgent revascularization in the next 14 days.  [place]  Heart Association (NYHA) Functional Class NYHA Class II       For questions or updates, please contact  [facility] Please consult [email or web address] for contact info under    Signed, Metta Actis, PA-C  10/25/2023 8:42 AM   Attending Note   Patient seen and discussed with PA Jorie Newness, I agree with her documentation. 73 yo female history of CAD with CABG in June 2023 with LIMA-LAD, SVG-OM, SVG-ramus. HTN, HLD presents with chest pain.  Symptos started 1 week ago. Dull pain lower mid to left chest 9/10 in severity, lasts up to 30 minutes. Comes on with exertion. Associated SOB, can get diaphoretic. Pain also worst with position, deep breathing. Tender to press on left chest area. Limited improvement with home NG.    K 4.1 Cr 1.96 BUN 28 WBC 6.4 Hgb 14.8 Plt 229  Trop 40-->43-->36 -trop 1 year ago was 40 - EKG SR, nonspecifc ST/T changes CXR no acute process    Assessment/plan 1.Chest pain/CAD -  CAD with CABG in June 2023 with LIMA-LAD, SVG-OM, SVG-ramus. - mixed chest pain symptoms. Typical in sense they are exertional, associated with SOB/DOE, last about 30 minutes and resolve with rest. Atypical in the sense positional, left chest wall tender to palpation - mild overall flat trop, prior mild trop of 40 notes about  1 year ago. Overall her trops are nonspecific - EKG without specific ischemic changes - echo is pending.  - renal function noted and would affect decisions  on ischemic testing.   - f/u echo. Would anticipate most likely a nuclear stress tomorrow, if low or intermediate risk then manage medically. If high risk findings weigh risk vs benefits of cath based on renal function and if any improvement during admission.     2. AKI on CKD - progressing renal decline over last year. Cr 1.1 a year ago, 1.5 in APril 2025. On admit Cr 1.96 - labs pending this AM   Armida Lander MD

## 2023-10-25 NOTE — Plan of Care (Signed)

## 2023-10-25 NOTE — Progress Notes (Signed)
 PROGRESS NOTE     Katelyn Kaufman, is a 73 y.o. female, DOB - 1950/07/23, NFA:[id number]  Admit date - 10/24/2023   Admitting Physician Angelys Yetman Quintella Buck, MD  Outpatient Primary MD for the patient is Hershel Los, DO  LOS - 0  Chief Complaint  Patient presents with   Chest Pain        Brief Narrative:   73 y.o. female with medical history significant of CAD, s/p CABG x 3 vessel (June 2023), ongoing tobacco abuse, COPD, Depression/Anxiety,HTN, HLD,DM2 admitted on 10/24/2023 with atypical chest pains and AKI on CKD    -Assessment and Plan: 1)Atypical Chest Pain-  CAD, s/p CABG x 3 vessel (June 2023) --Ruled out for ACS already by cardiac enzymes and EKG. - Cardiology consult to help decide further CV diagnostic modalities.  -  c/n  aspirin , nitroglycerin /isosorbide  , Crestor  and metoprolol .  -- Repeat/updated echo from 10/25/2023 with EF of 60 to 65%, no regional wall motion normalities, grade 1 diastolic dysfunction noted, no mitral stenosis, no aortic stenosis -As per Cardiology service possible stress test on 10/26/2023   2)Tobacco Abuse---  Smoking cessation advised C/n  Nicotine  patch   3)DM2-- - A1c 0.3 reflecting uncontrolled DM with hyperglycemia PTA Hold metformin  Use Novolog /Humalog Sliding scale insulin  with Accu-Cheks/Fingersticks as ordered    4)HTN--- hold p.m. dose of metoprolol , soft BP noted decrease losartan  to 50 mg daily - Continue isosorbide  given chest pain concerns  5)Depression/Anxiety--- continue Cymbalta    6) chronic pain syndrome--continue Subutex, Cymbalta  and gabapentin    7)History of left-sided breast cancer--- continue Arimidex    8)Class 2 Obesity- -Low calorie diet, portion control and increase physical activity discussed with patient -Body mass index is 35.87 kg/m.  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 Barriers: Not Clinically Stable-   Code Status :  -  Code Status: Full Code   Family Communication:   NA (patient is alert, awake and coherent)   DVT Prophylaxis  :   - SCDs   heparin  injection 5,000 Units Start: 10/24/23 2200 SCDs Start: 10/24/23 1439 Place TED hose Start: 10/24/23 1439   Lab Results  Component Value Date   PLT 229 10/24/2023    Inpatient Medications  Scheduled Meds:  acetaminophen   1,000 mg Oral Once   anastrozole   1 mg Oral Daily   aspirin  EC  81 mg Oral Q breakfast   buprenorphine  2 mg Sublingual Q12H   DULoxetine   60 mg Oral Daily   ezetimibe   10 mg Oral Daily   furosemide   60 mg Oral Daily   gabapentin   100 mg Oral BID   heparin   5,000 Units Subcutaneous Q8H   insulin  aspart  0-5 Units Subcutaneous QHS   insulin  aspart  0-6 Units Subcutaneous TID WC   isosorbide  mononitrate  30 mg Oral Daily   [START ON 10/26/2023] losartan   50 mg Oral Daily   [START ON 10/26/2023] metoprolol  tartrate  25 mg Oral BID   nicotine   14 mg Transdermal Daily   rosuvastatin   20 mg Oral Daily   sodium chloride  flush  3 mL Intravenous Q12H   sodium chloride  flush  3 mL Intravenous Q12H   Continuous Infusions: PRN Meds:.acetaminophen  **OR** acetaminophen , bisacodyl , hydrOXYzine , nitroGLYCERIN , ondansetron  **OR** ondansetron  (ZOFRAN ) IV, mouth rinse, polyethylene glycol, sodium chloride  flush, traZODone   Anti-infectives (From admission, onward)    None         Subjective: Katelyn Kaufman today has no fevers, no emesis,  - Intermittent chest pains frequently in severity improved - No leg pains or pleuritic symptoms  Objective: Vitals:    10/25/23 1436 10/25/23 1441 10/25/23 1717 10/25/23 2018  BP: (!) 68/44 (!) 77/38 (!) 75/45 (!) 84/45  Pulse: 70 68 70 66  Resp:   18 18  Temp: 98 F (36.7 C) 98 F (36.7 C) 98.5 F (36.9 C) 98.6 F (37 C)  TempSrc: Oral Oral Oral Oral  SpO2: 97% 97% 95% 93%  Weight:      Height:       No intake or output data in the 24 hours ending 10/25/23 2030 Filed Weights   10/24/23 1105 10/24/23 1559  Weight: 94.8 kg 94.8 kg    Physical Exam  Gen:- Awake Alert, in no acute distress HEENT:- [place].AT, No sclera icterus Neck-Supple Neck,No JVD,.  Lungs-  CTAB , fair symmetrical air movement CV- S1, S2 normal, regular , somewhat reproducible left-sided chest wall tenderness, prior sternotomy scar noted Abd-  +ve B.Sounds, Abd Soft, No tenderness,    Extremity/Skin:- No  edema, pedal pulses present  Psych-affect is appropriate, oriented x3 Neuro-no new focal deficits, no tremors  Data Reviewed: I have personally reviewed following labs and imaging studies  CBC: Recent Labs  Lab 10/24/23 1127  WBC 6.4  HGB 14.8  HCT 43.5  MCV 93.1  PLT 229   Basic Metabolic Panel: Recent Labs  Lab 10/24/23 1127 10/25/23 0833 10/25/23 0858  NA 137  --  130*  K 4.1  --  3.5  CL 101  --  96*  CO2 24  --  21*  GLUCOSE 123*  --  136*  BUN 28*  --  29*  CREATININE 1.96*  --  1.90*  CALCIUM  10.9*  --  10.0  MG  --  1.7  --   PHOS  --   --  3.5   GFR: Estimated Creatinine Clearance: 29.9 mL/min (A) (by C-G formula based on SCr of 1.9 mg/dL (H)). Liver Function Tests: Recent Labs  Lab 10/24/23 1322 10/25/23 0858  AST 28  --   ALT 26  --   ALKPHOS 66  --   BILITOT 0.5  --   PROT 8.0  --   ALBUMIN  4.3 3.5    HbA1C: Recent Labs    10/24/23 1127  HGBA1C 8.3*   Radiology Studies: ECHOCARDIOGRAM COMPLETE Result Date: 10/25/2023    ECHOCARDIOGRAM REPORT   [name] [facility] Date of Exam: 10/25/2023 Medical Rec #:  [id number]        Height:       64.0 in Accession #:     [id number]       Weight:       209.0 lb Date of Birth:  [birth year]         BSA:          1.993 m Patient Age:  72 years         BP:           103/62 mmHg Patient Gender: F                HR:           82 bpm. Exam Location:  [facility] Procedure: 2D Echo, Cardiac Doppler and Color Doppler (Both Spectral and Color            Flow Doppler were utilized during procedure). Indications:    Chest Pain R07.9  History:        Patient has prior history of Echocardiogram examinations. CHF,                 CAD and Previous Myocardial Infarction; Risk Factors:Current                 Smoker, Dyslipidemia, Diabetes and Hypertension.  Sonographer:    Denese Finn RCS Referring Phys: [phone] Barbee Mamula IMPRESSIONS  1. Left ventricular ejection fraction, by estimation, is 60 to 65%. The left ventricle has normal function. The left ventricle has no regional wall motion abnormalities. There is mild left ventricular hypertrophy. Left ventricular diastolic parameters are consistent with Grade I diastolic dysfunction (impaired relaxation).  2. Right ventricular systolic function is normal. The right ventricular size is normal.  3. The mitral valve is normal in structure. No evidence of mitral valve regurgitation. No evidence of mitral stenosis.  4. The tricuspid valve is abnormal.  5. The aortic valve is tricuspid. Aortic valve regurgitation is not visualized. No aortic stenosis is present.  6. The inferior vena cava is normal in size with greater than 50% respiratory variability, suggesting right atrial pressure of 3 mmHg. FINDINGS  Left Ventricle: Left ventricular ejection fraction, by estimation, is 60 to 65%. The left ventricle has normal function. The left ventricle has no regional wall motion abnormalities. The left ventricular internal cavity size was normal in size. There is  mild left ventricular hypertrophy. Left ventricular diastolic parameters are consistent with Grade I diastolic dysfunction (impaired relaxation).  Right Ventricle: The right ventricular size is normal. Right vetricular wall thickness was not well visualized. Right ventricular systolic function is normal. Left Atrium: Left atrial size was normal in size. Right Atrium: Right atrial size was normal in size. Pericardium: There is no evidence of pericardial effusion. Mitral Valve: The mitral valve is normal in structure. No evidence of mitral valve regurgitation. No evidence of mitral valve stenosis. Tricuspid Valve: The tricuspid valve is abnormal. Tricuspid valve regurgitation is mild . No evidence of tricuspid stenosis. Aortic Valve: The aortic valve is tricuspid. Aortic valve regurgitation is not visualized. No aortic stenosis is present. Aortic valve mean gradient measures 1.7 mmHg. Aortic valve peak gradient measures 4.0 mmHg. Aortic valve area, by VTI measures 2.47 cm. Pulmonic Valve: The pulmonic valve was not well visualized. Pulmonic valve regurgitation is not visualized. No evidence of pulmonic stenosis. Aorta: The aortic root is normal in size and structure. Venous: The inferior vena cava is normal in size with greater than 50% respiratory variability, suggesting right atrial pressure of 3 mmHg. IAS/Shunts: No atrial level shunt detected by color flow Doppler.  LEFT VENTRICLE PLAX 2D LVIDd:         3.70 cm   Diastology LVIDs:         2.35 cm   LV e' medial:    5.87 cm/s LV PW:         1.10 cm  LV E/e' medial:  8.9 LV IVS:        1.10 cm   LV e' lateral:   8.92 cm/s LVOT diam:     2.00 cm   LV E/e' lateral: 5.9 LV SV:         39 LV SV Index:   19 LVOT Area:     3.14 cm  RIGHT VENTRICLE RV S prime:     4.79 cm/s LEFT ATRIUM             Index        RIGHT ATRIUM           Index LA diam:        3.30 cm 1.66 cm/m   RA Area:     15.10 cm LA Vol (A2C):   48.7 ml 24.43 ml/m  RA Volume:   35.30 ml  17.71 ml/m LA Vol (A4C):   31.1 ml 15.60 ml/m LA Biplane Vol: 38.1 ml 19.11 ml/m  AORTIC VALVE AV Area (Vmax):    2.29 cm AV Area (Vmean):   2.65 cm AV  Area (VTI):     2.47 cm AV Vmax:           100.45 cm/s AV Vmean:          58.579 cm/s AV VTI:            0.157 m AV Peak Grad:      4.0 mmHg AV Mean Grad:      1.7 mmHg LVOT Vmax:         73.10 cm/s LVOT Vmean:        49.500 cm/s LVOT VTI:          0.123 m LVOT/AV VTI ratio: 0.78  AORTA Ao Root diam: 3.20 cm MITRAL VALVE               TRICUSPID VALVE MV Area (PHT): 1.57 cm    TR Peak grad:   16.6 mmHg MV Decel Time: 482 msec    TR Vmax:        204.00 cm/s MV E velocity: 52.30 cm/s MV A velocity: 82.70 cm/s  SHUNTS MV E/A ratio:  0.63        Systemic VTI:  0.12 m                            Systemic Diam: 2.00 cm Armida Lander MD Electronically signed by Armida Lander MD Signature Date/Time: 10/25/2023/4:22:40 PM    Final    DG Chest 2 View Result Date: 10/24/2023 CLINICAL DATA:  Chest pain EXAM: CHEST - 2 VIEW COMPARISON:  06/27/2023 FINDINGS: Prior median sternotomy and CABG. Heart and mediastinal contours are within normal limits. No focal opacities or effusions. No acute bony abnormality. IMPRESSION: No active cardiopulmonary disease. Electronically Signed   By: Janeece Mechanic M.D.   On: 10/24/2023 12:48     Scheduled Meds:  acetaminophen   1,000 mg Oral Once   anastrozole   1 mg Oral Daily   aspirin  EC  81 mg Oral Q breakfast   buprenorphine  2 mg Sublingual Q12H   DULoxetine   60 mg Oral Daily   ezetimibe   10 mg Oral Daily   furosemide   60 mg Oral Daily   gabapentin   100 mg Oral BID   heparin   5,000 Units Subcutaneous Q8H   insulin  aspart  0-5 Units Subcutaneous QHS   insulin  aspart  0-6 Units Subcutaneous TID WC  isosorbide  mononitrate  30 mg Oral Daily   [START ON 10/26/2023] losartan   50 mg Oral Daily   [START ON 10/26/2023] metoprolol  tartrate  25 mg Oral BID   nicotine   14 mg Transdermal Daily   rosuvastatin   20 mg Oral Daily   sodium chloride  flush  3 mL Intravenous Q12H   sodium chloride  flush  3 mL Intravenous Q12H   Continuous Infusions:   LOS: 0 days    Colin Dawley M.D on  10/25/2023 at 8:30 PM  Go to [email or web address] - for contact info  [facility] - Office  [phone]  If 7PM-7AM, please contact night-coverage [email or web address] 10/25/2023, 8:30 PM

## 2023-10-25 NOTE — Progress Notes (Incomplete)
 Attending Note   Patient seen and discussed with PA Katelyn Kaufman, I agree with her documentation. 73 yo female history of CAD with CABG in June 2023 with LIMA-LAD, SVG-OM, SVG-ramus. HTN, HLD presents with chest pain.  Symptos started 1 week ago. Dull pain lower mid to left chest 9/10 in severity, lasts up to 30 minutes. Comes on with exertion. Associated SOB, can get diaphoretic. Pain also worst with position, deep breathing. Tender to press on left chest area. Limited improvement with home NG.    K 4.1 Cr 1.96 BUN 28 WBC 6.4 Hgb 14.8 Plt 229  Trop 40-->43-->36 -trop 1 year ago was 40 - EKG SR, nonspecifc ST/T changes CXR no acute process    Assessment/plan 1.Chest pain/CAD -  CAD with CABG in June 2023 with LIMA-LAD, SVG-OM, SVG-ramus. - mixed chest pain symptoms. Typical in sense they are exertional, associated with SOB/DOE, last about 30 minutes and resolve with rest. Atypical in the sense positional, left chest wall tender to palpation - mild overall flat trop, prior mild trop of 40 notes about 1 year ago. Overall her trops are nonspecific - EKG without specific ischemic changes - echo is pending.  - renal function noted and would affect decisions on ischemic testing.   - f/u echo. Would anticipate most likely a nuclear stress tomorrow, if low or intermediate risk then manage medically. If high risk findings weigh risk vs benefits of cath based on renal function and if any improvement during admission.     2. AKI on CKD - progressing renal decline over last year. Cr 1.1 a year ago, 1.5 in APril 2025. On admit Cr 1.96 - labs pending this AM   Armida Lander MD

## 2023-10-25 NOTE — Care Management Obs Status (Signed)
 MEDICARE OBSERVATION STATUS NOTIFICATION   Patient Details  Name: Katelyn Kaufman MRN: [id number] Date of Birth: [birth year]   Medicare Observation Status Notification Given:  Yes    Neila Bally 10/25/2023, 3:28 PM

## 2023-10-26 ENCOUNTER — Observation Stay (HOSPITAL_COMMUNITY)

## 2023-10-26 DIAGNOSIS — I10 Essential (primary) hypertension: Secondary | ICD-10-CM | POA: Diagnosis not present

## 2023-10-26 DIAGNOSIS — N189 Chronic kidney disease, unspecified: Secondary | ICD-10-CM | POA: Diagnosis not present

## 2023-10-26 DIAGNOSIS — R0789 Other chest pain: Secondary | ICD-10-CM | POA: Diagnosis not present

## 2023-10-26 DIAGNOSIS — F419 Anxiety disorder, unspecified: Secondary | ICD-10-CM | POA: Diagnosis present

## 2023-10-26 DIAGNOSIS — I200 Unstable angina: Secondary | ICD-10-CM | POA: Diagnosis present

## 2023-10-26 DIAGNOSIS — Z794 Long term (current) use of insulin: Secondary | ICD-10-CM | POA: Diagnosis not present

## 2023-10-26 DIAGNOSIS — N179 Acute kidney failure, unspecified: Secondary | ICD-10-CM | POA: Diagnosis present

## 2023-10-26 DIAGNOSIS — G894 Chronic pain syndrome: Secondary | ICD-10-CM | POA: Diagnosis present

## 2023-10-26 DIAGNOSIS — E1122 Type 2 diabetes mellitus with diabetic chronic kidney disease: Secondary | ICD-10-CM | POA: Diagnosis present

## 2023-10-26 DIAGNOSIS — E785 Hyperlipidemia, unspecified: Secondary | ICD-10-CM | POA: Diagnosis present

## 2023-10-26 DIAGNOSIS — E66812 Obesity, class 2: Secondary | ICD-10-CM | POA: Diagnosis present

## 2023-10-26 DIAGNOSIS — Z853 Personal history of malignant neoplasm of breast: Secondary | ICD-10-CM | POA: Diagnosis not present

## 2023-10-26 DIAGNOSIS — Z7984 Long term (current) use of oral hypoglycemic drugs: Secondary | ICD-10-CM | POA: Diagnosis not present

## 2023-10-26 DIAGNOSIS — Z6835 Body mass index (BMI) 35.0-35.9, adult: Secondary | ICD-10-CM | POA: Diagnosis not present

## 2023-10-26 DIAGNOSIS — F32A Depression, unspecified: Secondary | ICD-10-CM | POA: Diagnosis present

## 2023-10-26 DIAGNOSIS — E118 Type 2 diabetes mellitus with unspecified complications: Secondary | ICD-10-CM | POA: Diagnosis not present

## 2023-10-26 DIAGNOSIS — Z951 Presence of aortocoronary bypass graft: Secondary | ICD-10-CM | POA: Diagnosis not present

## 2023-10-26 DIAGNOSIS — R079 Chest pain, unspecified: Secondary | ICD-10-CM | POA: Diagnosis not present

## 2023-10-26 DIAGNOSIS — I25110 Atherosclerotic heart disease of native coronary artery with unstable angina pectoris: Secondary | ICD-10-CM | POA: Diagnosis present

## 2023-10-26 DIAGNOSIS — N1832 Chronic kidney disease, stage 3b: Secondary | ICD-10-CM | POA: Diagnosis present

## 2023-10-26 DIAGNOSIS — Z7982 Long term (current) use of aspirin: Secondary | ICD-10-CM | POA: Diagnosis not present

## 2023-10-26 DIAGNOSIS — N183 Chronic kidney disease, stage 3 unspecified: Secondary | ICD-10-CM | POA: Diagnosis present

## 2023-10-26 DIAGNOSIS — E1165 Type 2 diabetes mellitus with hyperglycemia: Secondary | ICD-10-CM | POA: Diagnosis not present

## 2023-10-26 DIAGNOSIS — F17210 Nicotine dependence, cigarettes, uncomplicated: Secondary | ICD-10-CM | POA: Diagnosis present

## 2023-10-26 DIAGNOSIS — J449 Chronic obstructive pulmonary disease, unspecified: Secondary | ICD-10-CM | POA: Diagnosis present

## 2023-10-26 DIAGNOSIS — I252 Old myocardial infarction: Secondary | ICD-10-CM | POA: Diagnosis not present

## 2023-10-26 DIAGNOSIS — I129 Hypertensive chronic kidney disease with stage 1 through stage 4 chronic kidney disease, or unspecified chronic kidney disease: Secondary | ICD-10-CM | POA: Diagnosis present

## 2023-10-26 DIAGNOSIS — R002 Palpitations: Secondary | ICD-10-CM | POA: Diagnosis present

## 2023-10-26 DIAGNOSIS — Z7902 Long term (current) use of antithrombotics/antiplatelets: Secondary | ICD-10-CM | POA: Diagnosis not present

## 2023-10-26 DIAGNOSIS — Z79899 Other long term (current) drug therapy: Secondary | ICD-10-CM | POA: Diagnosis not present

## 2023-10-26 DIAGNOSIS — E861 Hypovolemia: Secondary | ICD-10-CM | POA: Diagnosis present

## 2023-10-26 LAB — BASIC METABOLIC PANEL WITH GFR
Anion gap: 8 (ref 5–15)
BUN: 39 mg/dL — ABNORMAL HIGH (ref 8–23)
CO2: 25 mmol/L (ref 22–32)
Calcium: 9.7 mg/dL (ref 8.9–10.3)
Chloride: 99 mmol/L (ref 98–111)
Creatinine, Ser: 2.46 mg/dL — ABNORMAL HIGH (ref 0.44–1.00)
GFR, Estimated: 20 mL/min — ABNORMAL LOW (ref >60)
Glucose, Bld: 154 mg/dL — ABNORMAL HIGH (ref 70–99)
Potassium: 4 mmol/L (ref 3.5–5.1)
Sodium: 132 mmol/L — ABNORMAL LOW (ref 135–145)

## 2023-10-26 LAB — GLUCOSE, CAPILLARY
Glucose-Capillary: 157 mg/dL — ABNORMAL HIGH (ref 70–99)
Glucose-Capillary: 185 mg/dL — ABNORMAL HIGH (ref 70–99)
Glucose-Capillary: 160 mg/dL — ABNORMAL HIGH (ref 70–99)
Glucose-Capillary: 156 mg/dL — ABNORMAL HIGH (ref 70–99)
Glucose-Capillary: 138 mg/dL — ABNORMAL HIGH (ref 70–99)

## 2023-10-26 LAB — NM MYOCAR MULTI W/SPECT W/WALL MOTION / EF
LV dias vol: 85 mL (ref 46–106)
LV sys vol: 33 mL
MPHR: 148 {beats}/min
Nuc Stress EF: 61 %
Peak HR: 81 {beats}/min
Percent HR: 54 %
RATE: 0.4
Rest HR: 67 {beats}/min
Rest Nuclear Isotope Dose: 11 mCi
ST Depression (mm): 0 mm
Stress Nuclear Isotope Dose: 30 mCi
TID: 1.25

## 2023-10-26 LAB — URINALYSIS, ROUTINE W REFLEX MICROSCOPIC
Bacteria, UA: NONE SEEN
Bilirubin Urine: NEGATIVE
Glucose, UA: NEGATIVE mg/dL
Hgb urine dipstick: NEGATIVE
Ketones, ur: NEGATIVE mg/dL
Nitrite: NEGATIVE
Protein, ur: NEGATIVE mg/dL
Specific Gravity, Urine: 1.01 (ref 1.005–1.030)
pH: 5 (ref 5.0–8.0)

## 2023-10-26 MED ORDER — TECHNETIUM TC 99M TETROFOSMIN IV KIT
30.0000 | PACK | Freq: Once | INTRAVENOUS | Status: AC | PRN
  Administered 2023-10-26: 855 via INTRAVENOUS

## 2023-10-26 MED ORDER — TECHNETIUM TC 99M TETROFOSMIN IV KIT
10.0000 | PACK | Freq: Once | INTRAVENOUS | Status: AC | PRN
  Administered 2023-10-26: 11 via INTRAVENOUS

## 2023-10-26 MED ORDER — SODIUM CHLORIDE FLUSH 0.9 % IV SOLN
INTRAVENOUS | Status: AC
  Filled 2023-10-26: qty 10

## 2023-10-26 MED ORDER — REGADENOSON 0.4 MG/5ML IV SOLN
INTRAVENOUS | Status: AC
  Administered 2023-10-26: 0.4 mg via INTRAVENOUS
  Filled 2023-10-26: qty 5

## 2023-10-26 MED ORDER — SODIUM CHLORIDE 0.9 % IV SOLN: INTRAVENOUS | Status: AC

## 2023-10-26 MED ORDER — SODIUM CHLORIDE 0.9 % IV BOLUS
500.0000 mL | Freq: Once | INTRAVENOUS | Status: AC
  Administered 2023-10-26: 500 mL via INTRAVENOUS

## 2023-10-26 NOTE — Plan of Care (Signed)
   Problem: Education: Goal: Knowledge of General Education information will improve Description: Including pain rating scale, medication(s)/side effects and non-pharmacologic comfort measures Outcome: Progressing   Problem: Clinical Measurements: Goal: Ability to maintain clinical measurements within normal limits will improve Outcome: Progressing Goal: Diagnostic test results will improve Outcome: Progressing

## 2023-10-26 NOTE — Progress Notes (Signed)
 Rounding Note   Patient Name: Katelyn Kaufman Date of Encounter: 10/26/2023  [facility] Cardiologist: Teddie Favre, MD ***  Subjective ***  Scheduled Meds:  acetaminophen   1,000 mg Oral Once   anastrozole   1 mg Oral Daily   aspirin  EC  81 mg Oral Q breakfast   buprenorphine  2 mg Sublingual Q12H   DULoxetine   60 mg Oral Daily   ezetimibe   10 mg Oral Daily   gabapentin   100 mg Oral BID   heparin   5,000 Units Subcutaneous Q8H   insulin  aspart  0-5 Units Subcutaneous QHS   insulin  aspart  0-6 Units Subcutaneous TID WC   nicotine   14 mg Transdermal Daily   rosuvastatin   20 mg Oral Daily   sodium chloride  flush  3 mL Intravenous Q12H   sodium chloride  flush  3 mL Intravenous Q12H   Continuous Infusions:  sodium chloride      PRN Meds: acetaminophen  **OR** acetaminophen , bisacodyl , hydrOXYzine , nitroGLYCERIN , ondansetron  **OR** ondansetron  (ZOFRAN ) IV, mouth rinse, polyethylene glycol, sodium chloride  flush, traZODone   Vital Signs  Vitals:   10/26/23 0032 10/26/23 0034 10/26/23 0414 10/26/23 1048  BP: (!) 98/48  (!) 100/54 (!) 141/67  Pulse: 71 65 74 68  Resp: 16  17   Temp: 98.4 F (36.9 C)  98.1 F (36.7 C)   TempSrc: Oral  Oral   SpO2: (!) 87% 94% 92%   Weight:      Height:       No intake or output data in the 24 hours ending 10/26/23 1115    10/24/2023    3:59 PM 10/24/2023   11:05 AM 10/04/2023   11:21 AM  Last 3 Weights  Weight (lbs) 208 lb 15.9 oz 209 lb 209 lb  Weight (kg) 94.8 kg 94.802 kg 94.802 kg      Telemetry *** - Personally Reviewed  ECG  *** - Personally Reviewed  Physical Exam *** GEN: No acute distress.   Neck: No JVD Cardiac: RRR, no murmurs, rubs, or gallops.  Respiratory: Clear to auscultation bilaterally. GI: Soft, nontender, non-distended  MS: No edema; No deformity. Neuro:  Nonfocal  Psych: Normal affect   Labs High Sensitivity Troponin:   Recent Labs  Lab 10/24/23 1127 10/24/23 1322 10/25/23 0439   TROPONINIHS 40* 43* 36*     Chemistry Recent Labs  Lab 10/24/23 1127 10/24/23 1322 10/25/23 0833 10/25/23 0858 10/26/23 0439  NA 137  --   --  130* 132*  K 4.1  --   --  3.5 4.0  CL 101  --   --  96* 99  CO2 24  --   --  21* 25  GLUCOSE 123*  --   --  136* 154*  BUN 28*  --   --  29* 39*  CREATININE 1.96*  --   --  1.90* 2.46*  CALCIUM  10.9*  --   --  10.0 9.7  MG  --   --  1.7  --   --   PROT  --  8.0  --   --   --   ALBUMIN   --  4.3  --  3.5  --   AST  --  28  --   --   --   ALT  --  26  --   --   --   ALKPHOS  --  66  --   --   --   BILITOT  --  0.5  --   --   --  GFRNONAA 27*  --   --  28* 20*  ANIONGAP 12  --   --  13 8    Lipids No results for input(s): "CHOL", "TRIG", "HDL", "LABVLDL", "LDLCALC", "CHOLHDL" in the last 168 hours.  Hematology Recent Labs  Lab 10/24/23 1127  WBC 6.4  RBC 4.67  HGB 14.8  HCT 43.5  MCV 93.1  MCH 31.7  MCHC 34.0  RDW 13.9  PLT 229   Thyroid   Recent Labs  Lab 10/25/23 0833  TSH 4.238    BNPNo results for input(s): "BNP", "PROBNP" in the last 168 hours.  DDimer No results for input(s): "DDIMER" in the last 168 hours.   Radiology  NM Myocar Multi W/Spect W/Wall Motion / EF Result Date: 10/26/2023   The study is normal. There are no perfusion defects consistent with prior infarct or current ischemia. The study is low risk.   No ST deviation was noted.   LV perfusion is normal.   Left ventricular function is normal. Nuclear stress EF: 61%. The left ventricular ejection fraction is normal (55-65%). End diastolic cavity size is normal.   There is a moderate size moderate intensity inferior defect that is most intense in the resting images with normal wall motion. Finding is consistent with artifact due to adjacent gut radiotracer uptake.   ECHOCARDIOGRAM COMPLETE Result Date: 10/25/2023    ECHOCARDIOGRAM REPORT   [name] [facility] Date of Exam: 10/25/2023 Medical Rec #:  [id number]        Height:       64.0 in Accession  #:    [id number]       Weight:       209.0 lb Date of Birth:  July 12, 1950         BSA:          1.993 m Patient Age:    72 years         BP:           103/62 mmHg Patient Gender: F                HR:           82 bpm. Exam Location:  [facility] Procedure: 2D Echo, Cardiac Doppler and Color Doppler (Both Spectral and Color            Flow Doppler were utilized during procedure). Indications:    Chest Pain R07.9  History:        Patient has prior history of Echocardiogram examinations. CHF,                 CAD and Previous Myocardial Infarction; Risk Factors:Current                 Smoker, Dyslipidemia, Diabetes and Hypertension.  Sonographer:    Denese Finn RCS Referring Phys: [phone] COURAGE EMOKPAE IMPRESSIONS  1. Left ventricular ejection fraction, by estimation, is 60 to 65%. The left ventricle has normal function. The left ventricle has no regional wall motion abnormalities. There is mild left ventricular hypertrophy. Left ventricular diastolic parameters are consistent with Grade I diastolic dysfunction (impaired relaxation).  2. Right ventricular systolic function is normal. The right ventricular size is normal.  3. The mitral valve is normal in structure. No evidence of mitral valve regurgitation. No evidence of mitral stenosis.  4. The tricuspid valve is abnormal.  5. The aortic valve is tricuspid. Aortic valve regurgitation is not visualized. No aortic stenosis is present.  6. The  inferior vena cava is normal in size with greater than 50% respiratory variability, suggesting right atrial pressure of 3 mmHg. FINDINGS  Left Ventricle: Left ventricular ejection fraction, by estimation, is 60 to 65%. The left ventricle has normal function. The left ventricle has no regional wall motion abnormalities. The left ventricular internal cavity size was normal in size. There is  mild left ventricular hypertrophy. Left ventricular diastolic parameters are consistent with Grade I diastolic dysfunction (impaired  relaxation). Right Ventricle: The right ventricular size is normal. Right vetricular wall thickness was not well visualized. Right ventricular systolic function is normal. Left Atrium: Left atrial size was normal in size. Right Atrium: Right atrial size was normal in size. Pericardium: There is no evidence of pericardial effusion. Mitral Valve: The mitral valve is normal in structure. No evidence of mitral valve regurgitation. No evidence of mitral valve stenosis. Tricuspid Valve: The tricuspid valve is abnormal. Tricuspid valve regurgitation is mild . No evidence of tricuspid stenosis. Aortic Valve: The aortic valve is tricuspid. Aortic valve regurgitation is not visualized. No aortic stenosis is present. Aortic valve mean gradient measures 1.7 mmHg. Aortic valve peak gradient measures 4.0 mmHg. Aortic valve area, by VTI measures 2.47 cm. Pulmonic Valve: The pulmonic valve was not well visualized. Pulmonic valve regurgitation is not visualized. No evidence of pulmonic stenosis. Aorta: The aortic root is normal in size and structure. Venous: The inferior vena cava is normal in size with greater than 50% respiratory variability, suggesting right atrial pressure of 3 mmHg. IAS/Shunts: No atrial level shunt detected by color flow Doppler.  LEFT VENTRICLE PLAX 2D LVIDd:         3.70 cm   Diastology LVIDs:         2.35 cm   LV e' medial:    5.87 cm/s LV PW:         1.10 cm   LV E/e' medial:  8.9 LV IVS:        1.10 cm   LV e' lateral:   8.92 cm/s LVOT diam:     2.00 cm   LV E/e' lateral: 5.9 LV SV:         39 LV SV Index:   19 LVOT Area:     3.14 cm  RIGHT VENTRICLE RV S prime:     4.79 cm/s LEFT ATRIUM             Index        RIGHT ATRIUM           Index LA diam:        3.30 cm 1.66 cm/m   RA Area:     15.10 cm LA Vol (A2C):   48.7 ml 24.43 ml/m  RA Volume:   35.30 ml  17.71 ml/m LA Vol (A4C):   31.1 ml 15.60 ml/m LA Biplane Vol: 38.1 ml 19.11 ml/m  AORTIC VALVE AV Area (Vmax):    2.29 cm AV Area (Vmean):    2.65 cm AV Area (VTI):     2.47 cm AV Vmax:           100.45 cm/s AV Vmean:          58.579 cm/s AV VTI:            0.157 m AV Peak Grad:      4.0 mmHg AV Mean Grad:      1.7 mmHg LVOT Vmax:         73.10 cm/s LVOT Vmean:  49.500 cm/s LVOT VTI:          0.123 m LVOT/AV VTI ratio: 0.78  AORTA Ao Root diam: 3.20 cm MITRAL VALVE               TRICUSPID VALVE MV Area (PHT): 1.57 cm    TR Peak grad:   16.6 mmHg MV Decel Time: 482 msec    TR Vmax:        204.00 cm/s MV E velocity: 52.30 cm/s MV A velocity: 82.70 cm/s  SHUNTS MV E/A ratio:  0.63        Systemic VTI:  0.12 m                            Systemic Diam: 2.00 cm Armida Lander MD Electronically signed by Armida Lander MD Signature Date/Time: 10/25/2023/4:22:40 PM    Final    DG Chest 2 View Result Date: 10/24/2023 CLINICAL DATA:  Chest pain EXAM: CHEST - 2 VIEW COMPARISON:  06/27/2023 FINDINGS: Prior median sternotomy and CABG. Heart and mediastinal contours are within normal limits. No focal opacities or effusions. No acute bony abnormality. IMPRESSION: No active cardiopulmonary disease. Electronically Signed   By: Janeece Mechanic M.D.   On: 10/24/2023 12:48      Patient Profile   Katelyn Kaufman is a 73 y.o. female with a hx of CAD (s/p CABG in 10/2021 LIMA to LAD, SVG to OM, and SVG to ramus intermedius), HTN, HLD, OSA, DM2, breast cancer s/p L mastectomy, depression/anxiety, chronic pain syndrome who is being seen 10/24/2023 for the evaluation of chest pain at the request of Dr. Quintella Buck.    Assessment & Plan  1.Chest pain/CAD -  CAD with CABG in June 2023 with LIMA-LAD, SVG-OM, SVG-ramus. - mixed chest pain symptoms. Typical in sense they are exertional, associated with SOB/DOE, last about 30 minutes and resolve with rest. Atypical in the sense positional, left chest wall tender to palpation - mild overall flat trop, prior mild trop of 40 notes about 1 year ago. Overall her trops are nonspecific - EKG without specific ischemic changes -  echo LVEF 60-65%, no WMAs, grade I dd - lexiscan  no ischemia, low risk  - no objective evidence of ischemia by extensive testing. In general very high threshold to consider cath given her renal dysfunction. Fortunately testing has been benign this admission - continue current medical, can titrate home imdur  to 45mg  daily.      2. AKI on CKD - progressing renal decline over last year. Cr 1.1 a year ago, 1.5 in APril 2025. On admit Cr 1.96 - labs pending this AM - hold ARB, diuretics - further management per primary team.  - soft bp's resolved with IVFs, likely prerenal  3. HTN - low bp's resolved with IVFs - home bp meds on hold, monitor bp's throughout the day and restart as able  Armida Lander MD      For questions or updates, please contact [facility] Please consult [email or web address] for contact info under     Signed, Armida Lander, MD  10/26/2023, 11:15 AM

## 2023-10-26 NOTE — Plan of Care (Signed)

## 2023-10-26 NOTE — Progress Notes (Signed)
 PROGRESS NOTE     Katelyn Kaufman, is a 73 y.o. female, DOB - 1951-04-25, ZOX:[id number]  Admit date - 10/24/2023   Admitting Physician Colin Dawley, MD  Outpatient Primary MD for the patient is Hershel Los, DO  LOS - 0  Brief Narrative:  73 y.o. female with medical history significant of CAD, s/p CABG x 3 vessel (June 2023), ongoing tobacco abuse, COPD, Depression/Anxiety,HTN, HLD,DM2 admitted on 10/24/2023 with atypical chest pains and AKI on CKD    Assessment and Plan:  Chest Pain/CAD, s/p CABG x 3 vessel (June 2023) Patient hospitalized for further management.  Troponins noted to be mildly elevated. Due to her history of CAD cardiology was consulted. Underwent echocardiogram which showed normal systolic function of the left ventricle.  No regional wall motion abnormalities. Plan is for stress test today.  Acute kidney injury on chronic kidney disease stage IIIb Baseline creatinine is around 1.5.  Came in with creatinine of 1.9.  Noted to be 2.4 this morning. Noted to be on diuretics and ARB which have been discontinued.  Noted to have low blood pressures overnight.  Continues to have urine output.  Will hold her nephrotoxic agents.  Will give her fluid bolus and started on maintenance IV fluids.  Monitor urine output and recheck labs in the morning.  Tobacco Abuse Smoking cessation advised Nicotine  patch   Diabetes mellitus type 2, uncontrolled with hyperglycemia Holding metformin .  SSI.  HbA1c 8.3.   Essential hypertension Low blood pressures noted.  Will hold her antihypertensives going forward.    Depression/Anxiety Continue Cymbalta    Chronic pain syndrome Continue Subutex, Cymbalta  and gabapentin    History of left-sided breast cancer Continue Arimidex    Class 2 Obesity- -Low calorie diet, portion control and increase physical activity discussed with patient -Body mass index is 35.87 kg/m.  DVT prophylaxis: Subcutaneous heparin  CODE STATUS: Full  code Family communication: Discussed with patient Disposition: Hopefully return home when improved.   Inpatient Medications  Scheduled Meds:  acetaminophen   1,000 mg Oral Once   anastrozole   1 mg Oral Daily   aspirin  EC  81 mg Oral Q breakfast   buprenorphine  2 mg Sublingual Q12H   DULoxetine   60 mg Oral Daily   ezetimibe   10 mg Oral Daily   gabapentin   100 mg Oral BID   heparin   5,000 Units Subcutaneous Q8H   insulin  aspart  0-5 Units Subcutaneous QHS   insulin  aspart  0-6 Units Subcutaneous TID WC   isosorbide  mononitrate  30 mg Oral Daily   metoprolol  tartrate  25 mg Oral BID   nicotine   14 mg Transdermal Daily   rosuvastatin   20 mg Oral Daily   sodium chloride  flush  3 mL Intravenous Q12H   sodium chloride  flush  3 mL Intravenous Q12H   Continuous Infusions:  sodium chloride      PRN Meds:.acetaminophen  **OR** acetaminophen , bisacodyl , hydrOXYzine , nitroGLYCERIN , ondansetron  **OR** ondansetron  (ZOFRAN ) IV, mouth rinse, polyethylene glycol, sodium chloride  flush, technetium tetrofosmin , technetium tetrofosmin , traZODone    Subjective: Complains of feeling fatigued.  Complains of arthritis pain.  No chest pain or shortness of breath.  Urinated this morning.  Objective: Vitals:   10/25/23 2018 10/26/23 0032 10/26/23 0034 10/26/23 0414  BP: (!) 84/45 (!) 98/48  (!) 100/54  Pulse: 66 71 65 74  Resp: 18 16  17   Temp: 98.6 F (37 C) 98.4 F (36.9 C)  98.1 F (36.7 C)  TempSrc: Oral Oral  Oral  SpO2: 93% (!) 87% 94% 92%  Weight:      Height:       No intake or output data in the 24 hours ending 10/26/23 0927 Filed Weights   10/24/23 1105 10/24/23 1559  Weight: 94.8 kg 94.8 kg    Physical Exam  General appearance: Awake alert.  In no distress Resp: Clear to auscultation bilaterally.  Normal effort Cardio: S1-S2 is normal regular.  No S3-S4.  No rubs murmurs or bruit GI: Abdomen is soft.  Nontender nondistended.  Bowel sounds are present normal.  No masses  organomegaly Extremities: No edema.  Full range of motion of lower extremities.  Physical deconditioning noted. Neurologic: Alert and oriented x3.  No focal neurological deficits.    Data Reviewed:   CBC: Recent Labs  Lab 10/24/23 1127  WBC 6.4  HGB 14.8  HCT 43.5  MCV 93.1  PLT 229   Basic Metabolic Panel: Recent Labs  Lab 10/24/23 1127 10/25/23 0833 10/25/23 0858 10/26/23 0439  NA 137  --  130* 132*  K 4.1  --  3.5 4.0  CL 101  --  96* 99  CO2 24  --  21* 25  GLUCOSE 123*  --  136* 154*  BUN 28*  --  29* 39*  CREATININE 1.96*  --  1.90* 2.46*  CALCIUM  10.9*  --  10.0 9.7  MG  --  1.7  --   --   PHOS  --   --  3.5  --    GFR: Estimated Creatinine Clearance: 23.1 mL/min (A) (by C-G formula based on SCr of 2.46 mg/dL (H)).  Liver Function Tests: Recent Labs  Lab 10/24/23 1322 10/25/23 0858  AST 28  --   ALT 26  --   ALKPHOS 66  --   BILITOT 0.5  --   PROT 8.0  --   ALBUMIN  4.3 3.5    HbA1C: Recent Labs    10/24/23 1127  HGBA1C 8.3*   Radiology Studies: ECHOCARDIOGRAM COMPLETE Result Date: 10/25/2023    ECHOCARDIOGRAM REPORT   [name] [facility] Date of Exam: 10/25/2023 Medical Rec #:  [id number]        Height:       64.0 in Accession #:    [id number]       Weight:       209.0 lb Date of Birth:  1951-02-07         BSA:          1.993 m Patient Age:    72 years         BP:           103/62 mmHg Patient Gender: F                HR:           82 bpm. Exam Location:  [facility] Procedure: 2D Echo, Cardiac Doppler and Color Doppler (Both Spectral and Color            Flow Doppler were utilized during procedure). Indications:    Chest Pain R07.9  History:        Patient has prior history of Echocardiogram examinations. CHF,                 CAD and Previous Myocardial Infarction; Risk Factors:Current                 Smoker, Dyslipidemia, Diabetes and Hypertension.  Sonographer:    Denese Finn RCS Referring Phys: [phone] COURAGE EMOKPAE IMPRESSIONS  1. Left  ventricular ejection fraction, by estimation, is 60 to 65%. The left ventricle has normal function. The left ventricle has no regional wall motion abnormalities. There is mild left ventricular hypertrophy. Left ventricular diastolic parameters are consistent with Grade I diastolic dysfunction (impaired relaxation).  2. Right ventricular systolic function is normal. The right ventricular size is normal.  3. The mitral valve is normal in structure. No evidence of mitral valve regurgitation. No evidence of mitral stenosis.  4. The tricuspid valve is abnormal.  5. The aortic valve is tricuspid. Aortic valve regurgitation is not visualized. No aortic stenosis is present.  6. The inferior vena cava is normal in size with greater than 50% respiratory variability, suggesting right atrial pressure of 3 mmHg. FINDINGS  Left Ventricle: Left ventricular ejection fraction, by estimation, is 60 to 65%. The left ventricle has normal function. The left ventricle has no regional wall motion abnormalities. The left ventricular internal cavity size was normal in size. There is  mild left ventricular hypertrophy. Left ventricular diastolic parameters are consistent with Grade I diastolic dysfunction (impaired relaxation). Right Ventricle: The right ventricular size is normal. Right vetricular wall thickness was not well visualized. Right ventricular systolic function is normal. Left Atrium: Left atrial size was normal in size. Right Atrium: Right atrial size was normal in size. Pericardium: There is no evidence of pericardial effusion. Mitral Valve: The mitral valve is normal in structure. No evidence of mitral valve regurgitation. No evidence of mitral valve stenosis. Tricuspid Valve: The tricuspid valve is abnormal. Tricuspid valve regurgitation is mild . No evidence of tricuspid stenosis. Aortic Valve: The aortic valve is tricuspid. Aortic valve regurgitation is not visualized. No aortic stenosis is present. Aortic valve mean  gradient measures 1.7 mmHg. Aortic valve peak gradient measures 4.0 mmHg. Aortic valve area, by VTI measures 2.47 cm. Pulmonic Valve: The pulmonic valve was not well visualized. Pulmonic valve regurgitation is not visualized. No evidence of pulmonic stenosis. Aorta: The aortic root is normal in size and structure. Venous: The inferior vena cava is normal in size with greater than 50% respiratory variability, suggesting right atrial pressure of 3 mmHg. IAS/Shunts: No atrial level shunt detected by color flow Doppler.  LEFT VENTRICLE PLAX 2D LVIDd:         3.70 cm   Diastology LVIDs:         2.35 cm   LV e' medial:    5.87 cm/s LV PW:         1.10 cm   LV E/e' medial:  8.9 LV IVS:        1.10 cm   LV e' lateral:   8.92 cm/s LVOT diam:     2.00 cm   LV E/e' lateral: 5.9 LV SV:         39 LV SV Index:   19 LVOT Area:     3.14 cm  RIGHT VENTRICLE RV S prime:     4.79 cm/s LEFT ATRIUM             Index        RIGHT ATRIUM           Index LA diam:        3.30 cm 1.66 cm/m   RA Area:     15.10 cm LA Vol (A2C):   48.7 ml 24.43 ml/m  RA Volume:   35.30 ml  17.71 ml/m LA Vol (A4C):   31.1 ml 15.60 ml/m LA Biplane Vol: 38.1 ml 19.11 ml/m  AORTIC VALVE AV Area (Vmax):    2.29 cm AV Area (Vmean):   2.65 cm AV Area (VTI):     2.47 cm AV Vmax:           100.45 cm/s AV Vmean:          58.579 cm/s AV VTI:            0.157 m AV Peak Grad:      4.0 mmHg AV Mean Grad:      1.7 mmHg LVOT Vmax:         73.10 cm/s LVOT Vmean:        49.500 cm/s LVOT VTI:          0.123 m LVOT/AV VTI ratio: 0.78  AORTA Ao Root diam: 3.20 cm MITRAL VALVE               TRICUSPID VALVE MV Area (PHT): 1.57 cm    TR Peak grad:   16.6 mmHg MV Decel Time: 482 msec    TR Vmax:        204.00 cm/s MV E velocity: 52.30 cm/s MV A velocity: 82.70 cm/s  SHUNTS MV E/A ratio:  0.63        Systemic VTI:  0.12 m                            Systemic Diam: 2.00 cm Armida Lander MD Electronically signed by Armida Lander MD Signature Date/Time: 10/25/2023/4:22:40 PM     Final    DG Chest 2 View Result Date: 10/24/2023 CLINICAL DATA:  Chest pain EXAM: CHEST - 2 VIEW COMPARISON:  06/27/2023 FINDINGS: Prior median sternotomy and CABG. Heart and mediastinal contours are within normal limits. No focal opacities or effusions. No acute bony abnormality. IMPRESSION: No active cardiopulmonary disease. Electronically Signed   By: Janeece Mechanic M.D.   On: 10/24/2023 12:48     Scheduled Meds:  acetaminophen   1,000 mg Oral Once   anastrozole   1 mg Oral Daily   aspirin  EC  81 mg Oral Q breakfast   buprenorphine  2 mg Sublingual Q12H   DULoxetine   60 mg Oral Daily   ezetimibe   10 mg Oral Daily   gabapentin   100 mg Oral BID   heparin   5,000 Units Subcutaneous Q8H   insulin  aspart  0-5 Units Subcutaneous QHS   insulin  aspart  0-6 Units Subcutaneous TID WC   isosorbide  mononitrate  30 mg Oral Daily   metoprolol  tartrate  25 mg Oral BID   nicotine   14 mg Transdermal Daily   rosuvastatin   20 mg Oral Daily   sodium chloride  flush  3 mL Intravenous Q12H   sodium chloride  flush  3 mL Intravenous Q12H   Continuous Infusions:  sodium chloride        LOS: 0 days    Maylene Spear M.D on 10/26/2023 at 9:27 AM  Go to [email or web address] - for contact info  [facility] - Office  [phone]  If 7PM-7AM, please contact night-coverage [email or web address] 10/26/2023, 9:27 AM

## 2023-10-27 ENCOUNTER — Telehealth: Payer: Self-pay | Admitting: Cardiology

## 2023-10-27 DIAGNOSIS — R0789 Other chest pain: Secondary | ICD-10-CM | POA: Diagnosis not present

## 2023-10-27 LAB — GLUCOSE, CAPILLARY
Glucose-Capillary: 185 mg/dL — ABNORMAL HIGH (ref 70–99)
Glucose-Capillary: 137 mg/dL — ABNORMAL HIGH (ref 70–99)

## 2023-10-27 LAB — BASIC METABOLIC PANEL WITH GFR
Anion gap: 8 (ref 5–15)
BUN: 27 mg/dL — ABNORMAL HIGH (ref 8–23)
CO2: 25 mmol/L (ref 22–32)
Calcium: 9.5 mg/dL (ref 8.9–10.3)
Chloride: 103 mmol/L (ref 98–111)
Creatinine, Ser: 1.75 mg/dL — ABNORMAL HIGH (ref 0.44–1.00)
GFR, Estimated: 31 mL/min — ABNORMAL LOW (ref >60)
Glucose, Bld: 127 mg/dL — ABNORMAL HIGH (ref 70–99)
Potassium: 3.6 mmol/L (ref 3.5–5.1)
Sodium: 136 mmol/L (ref 135–145)

## 2023-10-27 LAB — CBC
HCT: 35.6 % — ABNORMAL LOW (ref 36.0–46.0)
Hemoglobin: 11.9 g/dL — ABNORMAL LOW (ref 12.0–15.0)
MCH: 30.7 pg (ref 26.0–34.0)
MCHC: 33.4 g/dL (ref 30.0–36.0)
MCV: 92 fL (ref 80.0–100.0)
Platelets: 207 10*3/uL (ref 150–400)
RBC: 3.87 MIL/uL (ref 3.87–5.11)
RDW: 13.2 % (ref 11.5–15.5)
WBC: 6.3 10*3/uL (ref 4.0–10.5)
nRBC: 0 % (ref 0.0–0.2)

## 2023-10-27 LAB — MRSA NEXT GEN BY PCR, NASAL: MRSA by PCR Next Gen: NOT DETECTED

## 2023-10-27 MED ORDER — AMLODIPINE BESYLATE 5 MG PO TABS: 5.0000 mg | ORAL_TABLET | Freq: Every day | ORAL | 1 refills | Status: DC

## 2023-10-27 MED ORDER — CHLORHEXIDINE GLUCONATE CLOTH 2 % EX PADS
6.0000 | MEDICATED_PAD | Freq: Every day | CUTANEOUS | Status: DC
  Administered 2023-10-27: 6 via TOPICAL

## 2023-10-27 MED ORDER — EZETIMIBE 10 MG PO TABS: 10.0000 mg | ORAL_TABLET | Freq: Every day | ORAL | 1 refills | Status: DC

## 2023-10-27 MED ORDER — MUPIROCIN 2 % EX OINT: 1.0000 | TOPICAL_OINTMENT | Freq: Two times a day (BID) | CUTANEOUS | Status: DC

## 2023-10-27 MED ORDER — POTASSIUM CHLORIDE CRYS ER 20 MEQ PO TBCR
20.0000 meq | EXTENDED_RELEASE_TABLET | Freq: Every day | ORAL | Status: DC
Start: 2023-10-31 — End: 2024-02-15

## 2023-10-27 MED ORDER — AMLODIPINE BESYLATE 5 MG PO TABS
5.0000 mg | ORAL_TABLET | Freq: Every day | ORAL | Status: DC
  Administered 2023-10-27: 5 mg via ORAL
  Filled 2023-10-27: qty 1

## 2023-10-27 MED ORDER — ISOSORBIDE MONONITRATE ER 60 MG PO TB24
30.0000 mg | ORAL_TABLET | Freq: Every day | ORAL | Status: DC
  Administered 2023-10-27: 30 mg via ORAL
  Filled 2023-10-27: qty 1

## 2023-10-27 MED ORDER — FUROSEMIDE 40 MG PO TABS: 60.0000 mg | ORAL_TABLET | Freq: Every day | ORAL | Status: DC

## 2023-10-27 NOTE — Care Management Important Message (Signed)
 Important Message  Patient Details  Name: Katelyn Kaufman MRN: [id number] Date of Birth: May 05, 1951   Important Message Given:  N/A - LOS <3 / Initial given by admissions     Neila Bally 10/27/2023, 11:05 AM

## 2023-10-27 NOTE — Evaluation (Signed)
 Physical Therapy Evaluation Patient Details Name: Katelyn Kaufman MRN: [id number] DOB: [birth year] Today's Date: 10/27/2023  History of Present Illness  Katelyn Kaufman is a 73 y.o. female with medical history significant of CAD, s/p CABG x 3 vessel (June 2023), ongoing tobacco abuse, COPD, Depression/Anxiety,HTN, HLD,DM2 who presents with intermittent chest pains over last 2 to 3 weeks, works over last 3 days. Mostly Left Parasternal.  -Has been using Sublingual Nitroglycerin  and Tylenol  with mixed results.   C/o Headaches, and hot flashes  No Leg pains, no leg swelling and No Pleuritic sxs.  No fever  Or chills   No Nausea, Vomiting or Diarrhea  C/o arthralgia   Clinical Impression  Patient functioning near baseline for functional mobility and gait other than c/o limited use of left hand due to contractures and able to ambulate in room safely without an AD and good return ambulating in hallway using RW without problem. Plan:  Patient discharged from physical therapy to care of nursing for ambulation daily as tolerated for length of stay.          If plan is discharge home, recommend the following: A little help with walking and/or transfers;Help with stairs or ramp for entrance;Assistance with cooking/housework;A little help with bathing/dressing/bathroom   Can travel by private vehicle        Equipment Recommendations None recommended by PT  Recommendations for Other Services       Functional Status Assessment Patient has had a recent decline in their functional status and demonstrates the ability to make significant improvements in function in a reasonable and predictable amount of time.     Precautions / Restrictions Precautions Precautions: Fall Recall of Precautions/Restrictions: Intact Restrictions Weight Bearing Restrictions Per Provider Order: No      Mobility  Bed Mobility Overal bed mobility: Modified Independent             General bed mobility comments:  slightly labored movement with difficulty using left hand due to contractures    Transfers Overall transfer level: Modified independent Equipment used: Rolling walker (2 wheels)                    Ambulation/Gait Ambulation/Gait assistance: Modified independent (Device/Increase time) Gait Distance (Feet): 75 Feet Assistive device: Rolling walker (2 wheels) Gait Pattern/deviations: Decreased step length - right, Decreased step length - left, Decreased stride length Gait velocity: decreased     General Gait Details: slightly labored movement with good return for ambulating in room, hallway without loss of balance and able to grip walker with left hand  Stairs            Wheelchair Mobility     Tilt Bed    Modified Rankin (Stroke Patients Only)       Balance Overall balance assessment: Modified Independent, Mild deficits observed, not formally tested                                           Pertinent Vitals/Pain Pain Assessment Pain Assessment: Faces Faces Pain Scale: Hurts a little bit Pain Location: back Pain Descriptors / Indicators: Discomfort Pain Intervention(s): Limited activity within patient's tolerance, Monitored during session, Repositioned    Home Living Family/patient expects to be discharged to:: Private residence Living Arrangements: Non-relatives/Friends Available Help at Discharge: Friend(s);Available 24 hours/day Type of Home: House Home Access: Level entry  Home Layout: One level Home Equipment: Rollator (4 wheels);Cane - single point;BSC/3in1      Prior Function Prior Level of Function : Needs assist       Physical Assist : ADLs (physical)   ADLs (physical): Bathing;IADLs Mobility Comments: short distanced community ambulator with SPc and rollator. Pt reports she "does not go too far." ADLs Comments: Assit for bathing. Pt does not drive.     Extremity/Trunk Assessment   Upper Extremity  Assessment Upper Extremity Assessment: Defer to OT evaluation LUE Deficits / Details: Dupuytren's contracture seeming to be present for L 4th digit. Pt reports limited fine motor skills and increased difficulty with ADL tasks due to this issue. Reports that she was planning to see her PCP about plans to fix the issue. Pt also reports chronic arthrtis. LUE Coordination: decreased fine motor    Lower Extremity Assessment Lower Extremity Assessment: Overall WFL for tasks assessed    Cervical / Trunk Assessment Cervical / Trunk Assessment: Kyphotic  Communication   Communication Communication: No apparent difficulties    Cognition Arousal: Alert Behavior During Therapy: WFL for tasks assessed/performed   PT - Cognitive impairments: No apparent impairments                         Following commands: Intact       Cueing Cueing Techniques: Verbal cues     General Comments      Exercises     Assessment/Plan    PT Assessment All further PT needs can be met in the next venue of care  PT Problem List Decreased strength;Decreased activity tolerance;Decreased balance;Decreased mobility       PT Treatment Interventions      PT Goals (Current goals can be found in the Care Plan section)  Acute Rehab PT Goals Patient Stated Goal: return home with friend to assist PT Goal Formulation: With patient Time For Goal Achievement: 10/27/23 Potential to Achieve Goals: Good    Frequency       Co-evaluation PT/OT/SLP Co-Evaluation/Treatment: Yes Reason for Co-Treatment: To address functional/ADL transfers PT goals addressed during session: Mobility/safety with mobility;Balance;Proper use of DME OT goals addressed during session: ADL's and self-care       AM-PAC PT "6 Clicks" Mobility  Outcome Measure Help needed turning from your back to your side while in a flat bed without using bedrails?: None Help needed moving from lying on your back to sitting on the side of a  flat bed without using bedrails?: None Help needed moving to and from a bed to a chair (including a wheelchair)?: None Help needed standing up from a chair using your arms (e.g., wheelchair or bedside chair)?: None Help needed to walk in hospital room?: A Little Help needed climbing 3-5 steps with a railing? : A Little 6 Click Score: 22    End of Session   Activity Tolerance: Patient tolerated treatment well;Patient limited by fatigue Patient left: in bed;with call bell/phone within reach Nurse Communication: Mobility status PT Visit Diagnosis: Unsteadiness on feet (R26.81);Other abnormalities of gait and mobility (R26.89);Muscle weakness (generalized) (M62.81)    Time: 0810-0830 PT Time Calculation (min) (ACUTE ONLY): 20 min   Charges:   PT Evaluation $PT Eval Moderate Complexity: 1 Mod PT Treatments $Therapeutic Activity: 8-22 mins PT General Charges $$ ACUTE PT VISIT: 1 Visit         12:22 PM, 10/27/23 Walton Guppy, MPT Physical Therapist with [facility] 336 [phone] office [phone] mobile phone

## 2023-10-27 NOTE — Telephone Encounter (Signed)
 Patient picked up form at the [facility].

## 2023-10-27 NOTE — Discharge Summary (Signed)
 [facility]  Physician Discharge Summary   Patient ID: Katelyn Kaufman MRN: [id number] DOB/AGE: [birth year] 73 y.o.  Admit date: 10/24/2023 Discharge date:   10/27/2023   PCP: Johnie Nailer A, DO  DISCHARGE DIAGNOSES:    Chest pain   Type 2 diabetes mellitus with complication, with long-term current use of insulin  (HCC)   Hypertension   CAD---S/P CABG x 3 in June 2023   Tobacco use disorder   Class 2 obesity   AKI (acute kidney injury) (HCC)   RECOMMENDATIONS FOR OUTPATIENT FOLLOW UP: Basic metabolic panel in 1 week at follow-up with PCP Cardiology to arrange follow-up   Home Health: Ambulatory referral for PT and OT Equipment/Devices: None  CODE STATUS: Full code  DISCHARGE CONDITION: fair  Diet recommendation: As before  INITIAL HISTORY: 73 y.o. female with medical history significant of CAD, s/p CABG x 3 vessel (June 2023), ongoing tobacco abuse, COPD, Depression/Anxiety,HTN, HLD,DM2 admitted on 10/24/2023 with atypical chest pains and AKI on CKD   HOSPITAL COURSE:   Chest Pain/CAD, s/p CABG x 3 vessel (June 2023) Patient hospitalized for further management.  Troponins noted to be mildly elevated. Due to her history of CAD cardiology was consulted. Underwent echocardiogram which showed normal systolic function of the left ventricle.  No regional wall motion abnormalities. Patient underwent stress test which was a low risk study.  No further workup per cardiology.   Acute kidney injury on chronic kidney disease stage IIIb Baseline creatinine is around 1.5.  Came in with creatinine of 1.9.  Peaked at 2.4.  Diuretics and ARB were placed on hold.  Patient was hydrated with improvement in renal function this morning.  ARB will be discontinued.  Diuretics to be placed on hold for few days.  Recheck labs in the outpatient setting in 1 week.     Tobacco Abuse Smoking cessation advised Nicotine  patch   Diabetes mellitus type 2, uncontrolled with  hyperglycemia Holding metformin .  SSI.  HbA1c 8.3.   Essential hypertension Experienced low blood pressures in the hospital which have improved.   HCTZ has been changed over to amlodipine.  ARB has been discontinued.   Depression/Anxiety Continue Cymbalta    Chronic pain syndrome Continue Subutex, Cymbalta  and gabapentin    History of left-sided breast cancer Continue Arimidex    Class 2 Obesity- -Low calorie diet, portion control and increase physical activity discussed with patient -Body mass index is 35.87 kg/m.    Patient is stable.  Okay for discharge today.  Seen by PT and OT.  Outpatient therapy has been ordered.  PERTINENT LABS:  The results of significant diagnostics from this hospitalization (including imaging, microbiology, ancillary and laboratory) are listed below for reference.      Labs:   Basic Metabolic Panel: Recent Labs  Lab 10/24/23 1127 10/25/23 0833 10/25/23 0858 10/26/23 0439 10/27/23 0505  NA 137  --  130* 132* 136  K 4.1  --  3.5 4.0 3.6  CL 101  --  96* 99 103  CO2 24  --  21* 25 25  GLUCOSE 123*  --  136* 154* 127*  BUN 28*  --  29* 39* 27*  CREATININE 1.96*  --  1.90* 2.46* 1.75*  CALCIUM  10.9*  --  10.0 9.7 9.5  MG  --  1.7  --   --   --   PHOS  --   --  3.5  --   --    Liver Function Tests: Recent Labs  Lab 10/24/23 1322 10/25/23 0858  AST 28  --   ALT 26  --   ALKPHOS 66  --   BILITOT 0.5  --   PROT 8.0  --   ALBUMIN  4.3 3.5   Recent Labs  Lab 10/24/23 1322  LIPASE 33    CBC: Recent Labs  Lab 10/24/23 1127 10/27/23 0505  WBC 6.4 6.3  HGB 14.8 11.9*  HCT 43.5 35.6*  MCV 93.1 92.0  PLT 229 207    CBG: Recent Labs  Lab 10/26/23 1058 10/26/23 1636 10/26/23 1923 10/26/23 2119 10/27/23 0728  GLUCAP 185* 160* 156* 138* 185*     IMAGING STUDIES NM Myocar Multi W/Spect W/Wall Motion / EF Result Date: 10/26/2023   The study is normal. There are no perfusion defects consistent with prior infarct or current  ischemia. The study is low risk.   No ST deviation was noted.   LV perfusion is normal.   Left ventricular function is normal. Nuclear stress EF: 61%. The left ventricular ejection fraction is normal (55-65%). End diastolic cavity size is normal.   There is a moderate size moderate intensity inferior defect that is most intense in the resting images with normal wall motion. Finding is consistent with artifact due to adjacent gut radiotracer uptake.   ECHOCARDIOGRAM COMPLETE Result Date: 10/25/2023    ECHOCARDIOGRAM REPORT   [name] [facility] Date of Exam: 10/25/2023 Medical Rec #:  [id number]        Height:       64.0 in Accession #:    [id number]       Weight:       209.0 lb Date of Birth:  25-Sep-1950         BSA:          1.993 m Patient Age:    73 years         BP:           103/62 mmHg Patient Gender: F                HR:           82 bpm. Exam Location:  [facility] Procedure: 2D Echo, Cardiac Doppler and Color Doppler (Both Spectral and Color            Flow Doppler were utilized during procedure). Indications:    Chest Pain R07.9  History:        Patient has prior history of Echocardiogram examinations. CHF,                 CAD and Previous Myocardial Infarction; Risk Factors:Current                 Smoker, Dyslipidemia, Diabetes and Hypertension.  Sonographer:    Denese Finn RCS Referring Phys: [phone] COURAGE EMOKPAE IMPRESSIONS  1. Left ventricular ejection fraction, by estimation, is 60 to 65%. The left ventricle has normal function. The left ventricle has no regional wall motion abnormalities. There is mild left ventricular hypertrophy. Left ventricular diastolic parameters are consistent with Grade I diastolic dysfunction (impaired relaxation).  2. Right ventricular systolic function is normal. The right ventricular size is normal.  3. The mitral valve is normal in structure. No evidence of mitral valve regurgitation. No evidence of mitral stenosis.  4. The tricuspid valve is abnormal.  5.  The aortic valve is tricuspid. Aortic valve regurgitation is not visualized. No aortic stenosis is present.  6. The inferior vena cava is normal in size with greater than  50% respiratory variability, suggesting right atrial pressure of 3 mmHg. FINDINGS  Left Ventricle: Left ventricular ejection fraction, by estimation, is 60 to 65%. The left ventricle has normal function. The left ventricle has no regional wall motion abnormalities. The left ventricular internal cavity size was normal in size. There is  mild left ventricular hypertrophy. Left ventricular diastolic parameters are consistent with Grade I diastolic dysfunction (impaired relaxation). Right Ventricle: The right ventricular size is normal. Right vetricular wall thickness was not well visualized. Right ventricular systolic function is normal. Left Atrium: Left atrial size was normal in size. Right Atrium: Right atrial size was normal in size. Pericardium: There is no evidence of pericardial effusion. Mitral Valve: The mitral valve is normal in structure. No evidence of mitral valve regurgitation. No evidence of mitral valve stenosis. Tricuspid Valve: The tricuspid valve is abnormal. Tricuspid valve regurgitation is mild . No evidence of tricuspid stenosis. Aortic Valve: The aortic valve is tricuspid. Aortic valve regurgitation is not visualized. No aortic stenosis is present. Aortic valve mean gradient measures 1.7 mmHg. Aortic valve peak gradient measures 4.0 mmHg. Aortic valve area, by VTI measures 2.47 cm. Pulmonic Valve: The pulmonic valve was not well visualized. Pulmonic valve regurgitation is not visualized. No evidence of pulmonic stenosis. Aorta: The aortic root is normal in size and structure. Venous: The inferior vena cava is normal in size with greater than 50% respiratory variability, suggesting right atrial pressure of 3 mmHg. IAS/Shunts: No atrial level shunt detected by color flow Doppler.  LEFT VENTRICLE PLAX 2D LVIDd:         3.70 cm    Diastology LVIDs:         2.35 cm   LV e' medial:    5.87 cm/s LV PW:         1.10 cm   LV E/e' medial:  8.9 LV IVS:        1.10 cm   LV e' lateral:   8.92 cm/s LVOT diam:     2.00 cm   LV E/e' lateral: 5.9 LV SV:         39 LV SV Index:   19 LVOT Area:     3.14 cm  RIGHT VENTRICLE RV S prime:     4.79 cm/s LEFT ATRIUM             Index        RIGHT ATRIUM           Index LA diam:        3.30 cm 1.66 cm/m   RA Area:     15.10 cm LA Vol (A2C):   48.7 ml 24.43 ml/m  RA Volume:   35.30 ml  17.71 ml/m LA Vol (A4C):   31.1 ml 15.60 ml/m LA Biplane Vol: 38.1 ml 19.11 ml/m  AORTIC VALVE AV Area (Vmax):    2.29 cm AV Area (Vmean):   2.65 cm AV Area (VTI):     2.47 cm AV Vmax:           100.45 cm/s AV Vmean:          58.579 cm/s AV VTI:            0.157 m AV Peak Grad:      4.0 mmHg AV Mean Grad:      1.7 mmHg LVOT Vmax:         73.10 cm/s LVOT Vmean:        49.500 cm/s LVOT VTI:  0.123 m LVOT/AV VTI ratio: 0.78  AORTA Ao Root diam: 3.20 cm MITRAL VALVE               TRICUSPID VALVE MV Area (PHT): 1.57 cm    TR Peak grad:   16.6 mmHg MV Decel Time: 482 msec    TR Vmax:        204.00 cm/s MV E velocity: 52.30 cm/s MV A velocity: 82.70 cm/s  SHUNTS MV E/A ratio:  0.63        Systemic VTI:  0.12 m                            Systemic Diam: 2.00 cm Armida Lander MD Electronically signed by Armida Lander MD Signature Date/Time: 10/25/2023/4:22:40 PM    Final    DG Chest 2 View Result Date: 10/24/2023 CLINICAL DATA:  Chest pain EXAM: CHEST - 2 VIEW COMPARISON:  06/27/2023 FINDINGS: Prior median sternotomy and CABG. Heart and mediastinal contours are within normal limits. No focal opacities or effusions. No acute bony abnormality. IMPRESSION: No active cardiopulmonary disease. Electronically Signed   By: Janeece Mechanic M.D.   On: 10/24/2023 12:48    DISCHARGE EXAMINATION: Vitals:   10/26/23 1048 10/26/23 1343 10/26/23 1920 10/27/23 0457  BP: (!) 141/67 (!) 112/49 (!) 113/57 138/80  Pulse: 68 77 75 80   Resp:   17 17  Temp:  98.2 F (36.8 C) 98.6 F (37 C) 98 F (36.7 C)  TempSrc:  Oral Oral Oral  SpO2:  96% 94% 95%  Weight:      Height:       General appearance: Awake alert.  In no distress Resp: Clear to auscultation bilaterally.  Normal effort Cardio: S1-S2 is normal regular.  No S3-S4.  No rubs murmurs or bruit GI: Abdomen is soft.  Nontender nondistended.  Bowel sounds are present normal.  No masses organomegaly  DISPOSITION: Home  Discharge Instructions     Ambulatory referral to Occupational Therapy   Complete by: As directed    Ambulatory referral to Physical Therapy   Complete by: As directed    Call MD for:  difficulty breathing, headache or visual disturbances   Complete by: As directed    Call MD for:  extreme fatigue   Complete by: As directed    Call MD for:  persistant dizziness or light-headedness   Complete by: As directed    Call MD for:  persistant nausea and vomiting   Complete by: As directed    Call MD for:  severe uncontrolled pain   Complete by: As directed    Call MD for:  temperature >100.4   Complete by: As directed    Diet - low sodium heart healthy   Complete by: As directed    Discharge instructions   Complete by: As directed    Please note changes made to your medications.  Please be sure to follow-up with your primary care provider in 1 week.  You should have blood work at that time to check your kidney function.  Referral has been sent for outpatient physical and Occupational Therapy.  You were cared for by a hospitalist during your hospital stay. If you have any questions about your discharge medications or the care you received while you were in the hospital after you are discharged, you can call the unit and asked to speak with the hospitalist on call if the hospitalist that took care of you  is not available. Once you are discharged, your primary care physician will handle any further medical issues. Please note that NO REFILLS for any  discharge medications will be authorized once you are discharged, as it is imperative that you return to your primary care physician (or establish a relationship with a primary care physician if you do not have one) for your aftercare needs so that they can reassess your need for medications and monitor your lab values. If you do not have a primary care physician, you can call [phone] for a physician referral.   Increase activity slowly   Complete by: As directed          Allergies as of 10/27/2023       Reactions   Latex Rash        Medication List     STOP taking these medications    celecoxib  50 MG capsule Commonly known as: CELEBREX    hydrochlorothiazide 25 MG tablet Commonly known as: HYDRODIURIL   losartan  100 MG tablet Commonly known as: COZAAR        TAKE these medications    acetaminophen  325 MG tablet Commonly known as: TYLENOL  Take 2 tablets (650 mg total) by mouth every 6 (six) hours as needed for mild pain (or Fever >/= 101). What changed: when to take this   amLODipine 5 MG tablet Commonly known as: NORVASC Take 1 tablet (5 mg total) by mouth daily.   anastrozole  1 MG tablet Commonly known as: ARIMIDEX  TAKE 1 TABLET(1 MG) BY MOUTH DAILY What changed: See the new instructions.   aspirin  EC 81 MG tablet Take 1 tablet (81 mg total) by mouth daily. Swallow whole.   buprenorphine 2 MG Subl SL tablet Commonly known as: SUBUTEX Place 4 mg under the tongue every 8 (eight) hours.   cholecalciferol  25 MCG (1000 UNIT) tablet Commonly known as: VITAMIN D3 Take 1,000 Units by mouth daily.   cyanocobalamin  1000 MCG tablet Commonly known as: VITAMIN B12 Take 1,000 mcg by mouth daily.   cyclobenzaprine  10 MG tablet Commonly known as: FLEXERIL  Take 10 mg by mouth 2 (two) times daily as needed for muscle spasms.   DULoxetine  60 MG capsule Commonly known as: CYMBALTA  Take 60 mg by mouth daily.   ezetimibe  10 MG tablet Commonly known as: ZETIA  Take 1  tablet (10 mg total) by mouth daily.   fexofenadine 180 MG tablet Commonly known as: ALLEGRA Take 180 mg by mouth daily.   furosemide  40 MG tablet Commonly known as: LASIX  Take 1.5 tablets (60 mg total) by mouth daily. Please resume on 10/31/2023 Start taking on: October 31, 2023 What changed:  See the new instructions. These instructions start on October 31, 2023. If you are unsure what to do until then, ask your doctor or other care provider.   gabapentin  100 MG capsule Commonly known as: NEURONTIN  Take 1 capsule (100 mg total) by mouth 3 (three) times daily as needed.   glimepiride  4 MG tablet Commonly known as: AMARYL  Take 4 mg by mouth daily with breakfast.   hydrOXYzine  25 MG tablet Commonly known as: ATARAX  TAKE 1 TABLET(25 MG) BY MOUTH DAILY What changed: See the new instructions.   insulin  glargine 100 UNIT/ML injection Commonly known as: LANTUS  Inject 1 Units into the skin at bedtime.   isosorbide  mononitrate 30 MG 24 hr tablet Commonly known as: IMDUR  Take 1 tablet (30 mg total) by mouth daily.   metFORMIN  500 MG 24 hr tablet Commonly known as: GLUCOPHAGE -XR Take 1,000 mg  by mouth 2 (two) times daily with a meal.   metoprolol  tartrate 25 MG tablet Commonly known as: LOPRESSOR  Take 1 tablet (25 mg total) by mouth 2 (two) times daily.   nitroGLYCERIN  0.4 MG SL tablet Commonly known as: NITROSTAT  Place 1 tablet (0.4 mg total) under the tongue every 5 (five) minutes x 3 doses as needed for chest pain (if no relief after 3rd dose, proceed to ED or call 911).   ondansetron  4 MG disintegrating tablet Commonly known as: ZOFRAN -ODT Take 1 tablet (4 mg total) by mouth every 8 (eight) hours as needed for vomiting or nausea. For 7 days supply   potassium chloride  SA 20 MEQ tablet Commonly known as: KLOR-CON  M Take 1 tablet (20 mEq total) by mouth daily. Please resume on 10/31/2023 Start taking on: October 31, 2023 What changed:  additional instructions These instructions start  on October 31, 2023. If you are unsure what to do until then, ask your doctor or other care provider.   pregabalin  50 MG capsule Commonly known as: LYRICA  Take 1 capsule (50 mg total) by mouth 3 (three) times daily as needed. What changed: reasons to take this   rosuvastatin  20 MG tablet Commonly known as: CRESTOR  Take 1 tablet (20 mg total) by mouth daily.   vitamin C  1000 MG tablet Take 1,000 mg by mouth daily.          Follow-up Information     Lasalle Pointer, NP Follow up on 11/30/2023.   Specialty: Cardiology Why: Follow up with Lasalle Pointer, NP on 11/30/2023 at 2 PM. Contact information: [address] [place] [address] [phone]         Johnie Nailer A, DO. Schedule an appointment as soon as possible for a visit in 1 week(s).   Specialty: Family Medicine Contact information: [address] [place] [address] [phone]                 TOTAL DISCHARGE TIME: 35 minutes  Abby Stines Lyndon Santiago  [facility] Pager on [email or web address]  10/27/2023, 9:54 AM

## 2023-10-27 NOTE — Telephone Encounter (Signed)
 Patient called to follow-up on paperwork.  Patient wants a call back to confirm document is completed and if she can pick it up.

## 2023-10-27 NOTE — Evaluation (Signed)
 Occupational Therapy Evaluation Patient Details Name: Katelyn Kaufman MRN: [id number] DOB: Dec 29, 1950 Today's Date: 10/27/2023   History of Present Illness   Katelyn Kaufman is a 73 y.o. female with medical history significant of CAD, s/p CABG x 3 vessel (June 2023), ongoing tobacco abuse, COPD, Depression/Anxiety,HTN, HLD,DM2 who presents with intermittent chest pains over last 2 to 3 weeks, works over last 3 days. Mostly Left Parasternal.  -Has been using Sublingual Nitroglycerin  and Tylenol  with mixed results.   C/o Headaches, and hot flashes  No Leg pains, no leg swelling and No Pleuritic sxs.  No fever  Or chills   No Nausea, Vomiting or Diarrhea  C/o arthralgia (per MD)     Clinical Impressions Pt agreeable to OT and PT co-evaluation. Pt is at level of Mod I for most ADL's and functional mobility. Reports need for assist to wash her back previously to admission. Reports increased weakness and decreased fine motor skills in L hand with pt feeling as though R hand is decreasing as well. Dupuytren's contracture or similar appears to be impacting 4th digit of L hand. B shoulder also weak at 3-/5 bilaterally. Pt is not recommended for further acute OT services and will be discharged to care of nursing staff for remaining length of stay.      If plan is discharge home, recommend the following:   Assist for transportation     Functional Status Assessment   Patient has not had a recent decline in their functional status     Equipment Recommendations   None recommended by OT             Precautions/Restrictions   Precautions Precautions: Fall Recall of Precautions/Restrictions: Intact Restrictions Weight Bearing Restrictions Per Provider Order: No     Mobility Bed Mobility Overal bed mobility: Modified Independent             General bed mobility comments: with use of RW to ambulate in the hall    Transfers Overall transfer level: Modified  independent Equipment used: Rolling walker (2 wheels)               General transfer comment: no physical assist needed.      Balance Overall balance assessment: Modified Independent, Mild deficits observed, not formally tested                                         ADL either performed or assessed with clinical judgement   ADL Overall ADL's : Modified independent                                       General ADL Comments: No physical assist for tasks completed today, but pt reports assist needed to clean her back during bathing.     Vision Baseline Vision/History: 1 Wears glasses Ability to See in Adequate Light: 1 Impaired Patient Visual Report: No change from baseline Additional Comments: Vision deficits at baseline; reports needing a vision assessment.     Perception Perception: Not tested       Praxis Praxis: Not tested       Pertinent Vitals/Pain Pain Assessment Pain Assessment: Faces Faces Pain Scale: Hurts a little bit Pain Location: back Pain Descriptors / Indicators: Discomfort Pain Intervention(s): Monitored during session, Repositioned     Extremity/Trunk Assessment  Upper Extremity Assessment Upper Extremity Assessment: LUE deficits/detail;Generalized weakness (3-/5 bilateral shoulder flexion with pt showing signs of pain with attempted P/ROM bilaterally.) LUE Deficits / Details: Dupuytren's contracture seeming to be present for L 4th digit. Pt reports limited fine motor skills and increased difficulty with ADL tasks due to this issue. Reports that she was planning to see her PCP about plans to fix the issue. Pt also reports chronic arthrtis. LUE Coordination: decreased fine motor   Lower Extremity Assessment Lower Extremity Assessment: Defer to PT evaluation   Cervical / Trunk Assessment Cervical / Trunk Assessment: Kyphotic   Communication Communication Communication: No apparent difficulties   Cognition  Arousal: Alert Behavior During Therapy: WFL for tasks assessed/performed Cognition: No apparent impairments                               Following commands: Intact       Cueing  General Comments   Cueing Techniques: Verbal cues                 Home Living Family/patient expects to be discharged to:: Private residence Living Arrangements: Non-relatives/Friends Available Help at Discharge: Friend(s);Available 24 hours/day (Pt reports this level of assist is temporary.) Type of Home: House Home Access: Level entry     Home Layout: One level     Bathroom Shower/Tub: Chief Strategy Officer: Standard Bathroom Accessibility: Yes How Accessible: Accessible via wheelchair;Accessible via walker Home Equipment: Rollator (4 wheels);Cane - single point;BSC/3in1          Prior Functioning/Environment Prior Level of Function : Needs assist       Physical Assist : ADLs (physical)   ADLs (physical): Bathing;IADLs Mobility Comments: Civil engineer, contracting and rollator. Pt reports she "does not go too far." ADLs Comments: Assit for bathing. Pt does not drive.                            Co-evaluation PT/OT/SLP Co-Evaluation/Treatment: Yes Reason for Co-Treatment: To address functional/ADL transfers   OT goals addressed during session: ADL's and self-care                       End of Session Equipment Utilized During Treatment: Rolling walker (2 wheels)  Activity Tolerance: Patient tolerated treatment well Patient left: in bed;with call bell/phone within reach  OT Visit Diagnosis: Muscle weakness (generalized) (M62.81);Unsteadiness on feet (R26.81)                Time: 1610-9604 OT Time Calculation (min): 13 min Charges:  OT General Charges $OT Visit: 1 Visit OT Evaluation $OT Eval Low Complexity: 1 Low  Bret Stamour OT, MOT  Thurnell Floss 10/27/2023, 9:31 AM

## 2023-10-27 NOTE — Progress Notes (Signed)
 Discharge instructions explained no needs at this time.

## 2023-10-27 NOTE — Progress Notes (Signed)
    Presented with chest pain, negative extensive evaluation for ischemia including EKG, enzymes, echo, and lexiscan . No additional cardiac testing planned at this time. Kept overnight due to low bp's, AKI both look to be due to hypovolemia and both improving with IVFs. Would change her home hydrochlorothiazide to norvasc 5mg  daily, restart home imdur  at 30mg  daily, remain off losartan  at this time. She has an upcoming appt with neprhology and they can decide on continued use.   No additional recs from cardiac standpoint,we will arrange outpatient f/u. We will sign off inpatient care.    Armida Lander MD

## 2023-10-27 NOTE — TOC Transition Note (Signed)
 Transition of Care [facility]) - Discharge Note   Patient Details  Name: Katelyn Kaufman MRN: [id number] Date of Birth: 02/26/1951  [facility] of [facility]) CM/SW Contact:  Juanda Noon Phone Number: 10/27/2023, 10:47 AM   Clinical Narrative:     Writer spoke with patient at bedside. Patient shared that she does not live alone, Rita stays with her and helps care for her. Writer shared that PT recommended OPPT and OT. Patient agreeable to order being sent to [facility] outpatient rehab in [place] [address]. Order placed. Patient expressed that Franky Ivanoff was on her way to pick her up. TOC signing off.   Final next level of care: OP Rehab Barriers to Discharge: Continued Medical Work up   Patient Goals and CMS Choice Patient states their goals for this hospitalization and ongoing recovery are:: DC back home CMS Medicare.gov Compare Post Acute Care list provided to:: Patient Choice offered to / list presented to : Patient      Discharge Placement              Patient chooses bed at:  (Patient will DC back home) Patient to be transferred to facility by: POV-Rita Name of family member notified: Jatara - patient Patient and family notified of of transfer: 10/27/23  Discharge Plan and Services Additional resources added to the After Visit Summary for                  DME Arranged: N/A DME Agency: NA                  Social Drivers of Health (SDOH) Interventions SDOH Screenings   Food Insecurity: No Food Insecurity (10/24/2023)  Housing: Low Risk  (10/24/2023)  Transportation Needs: No Transportation Needs (10/24/2023)  Utilities: Not At Risk (10/24/2023)  Depression (PHQ2-9): Low Risk  (08/17/2022)  Financial Resource Strain: Low Risk  (02/10/2023)   Received from [facility]  Recent Concern: Financial Resource Strain - Medium Risk (11/24/2022)   Received from [facility]  Physical Activity: Insufficiently Active (12/30/2022)   Received from [facility]  Social Connections: Moderately Integrated (10/24/2023)  Stress: Stress Concern Present (02/10/2023)   Received from [facility]  Tobacco Use: High Risk (10/24/2023)  Health Literacy: Low Risk  (02/10/2023)   Received from [facility]     Readmission Risk Interventions    10/27/2023   10:46 AM 11/23/2021    9:39 AM  Readmission Risk Prevention Plan  Post Dischage Appt  Complete  Medication Screening  Complete  Transportation Screening Complete Complete  PCP or Specialist Appt within 5-7 Days Complete   Home Care Screening Complete   Medication Review (RN CM) Complete

## 2023-11-03 ENCOUNTER — Other Ambulatory Visit: Payer: Self-pay | Admitting: Cardiology

## 2023-11-07 ENCOUNTER — Ambulatory Visit (HOSPITAL_COMMUNITY)
Admission: RE | Admit: 2023-11-07 | Discharge: 2023-11-07 | Disposition: A | Discharge status: Home / Self Care | Payer: Medicare (Managed Care) | Source: Ambulatory Visit | Attending: Hematology | Admitting: Hematology

## 2023-11-07 DIAGNOSIS — Z1231 Encounter for screening mammogram for malignant neoplasm of breast: Secondary | ICD-10-CM | POA: Insufficient documentation

## 2023-11-10 ENCOUNTER — Encounter: Payer: Self-pay | Admitting: Nurse Practitioner

## 2023-11-10 ENCOUNTER — Ambulatory Visit: Attending: Nurse Practitioner | Admitting: Nurse Practitioner

## 2023-11-10 VITALS — BP 122/72 | HR 79 | Ht 64.0 in | Wt 190.4 lb

## 2023-11-10 DIAGNOSIS — E782 Mixed hyperlipidemia: Secondary | ICD-10-CM

## 2023-11-10 DIAGNOSIS — I2510 Atherosclerotic heart disease of native coronary artery without angina pectoris: Secondary | ICD-10-CM | POA: Diagnosis not present

## 2023-11-10 DIAGNOSIS — N179 Acute kidney failure, unspecified: Secondary | ICD-10-CM | POA: Diagnosis not present

## 2023-11-10 DIAGNOSIS — I10 Essential (primary) hypertension: Secondary | ICD-10-CM

## 2023-11-10 DIAGNOSIS — N1832 Chronic kidney disease, stage 3b: Secondary | ICD-10-CM

## 2023-11-10 DIAGNOSIS — D649 Anemia, unspecified: Secondary | ICD-10-CM

## 2023-11-10 DIAGNOSIS — Z79899 Other long term (current) drug therapy: Secondary | ICD-10-CM

## 2023-11-10 NOTE — Patient Instructions (Addendum)
 Medication Instructions:  Your physician recommends that you continue on your current medications as directed. Please refer to the Current Medication list given to you today.  Labwork: In 1-2 weeks at [facility] and in 2 months at [facility]   Testing/Procedures: None   Follow-Up: Your physician recommends that you schedule a follow-up appointment in: 6 Months   Any Other Special Instructions Will Be Listed Below (If Applicable).  If you need a refill on your cardiac medications before your next appointment, please call your pharmacy.

## 2023-11-10 NOTE — Progress Notes (Unsigned)
 Cardiology Office Note:  .   Date:  11/10/2023 ID:  Katelyn Kaufman, DOB 25-Apr-1951, MRN [id number] PCP: Katelyn Los, DO  [facility] Providers Cardiologist:  Teddie Favre, MD    History of Present Illness: .   Katelyn Kaufman is a 73 y.o. female with a PMH of CAD, s/p CABG in June 2023, HTN, mixed HLD, OSA, T2DM, hx of breast cancer, s/p left mastectomy, tobacco abuse, who presents today for hospital follow-up.   Last seen by Dr. Londa Rival on Oct 04, 2023.  She was doing well from a cardiac perspective.    Recently hospitalized early June 2025 for chest pain, trops were mildly elevated. Cardiology was consulted. Echo showed normal LVEF, underwent NST that was low risk. Did have some AKI on CKD stage 3b, ARB was d/c. Recommended to recheck labs in OP setting.   BMET in 1-2 weeks, CBC  August 2025 - flp.     ROS: Negative. See HPI.   Studies Reviewed: Aaron Aas    TEE 10/2021:  Complications: No known complications during this procedure.  POST-OP IMPRESSIONS  _ Left Ventricle: has hyperdynamic systolic function, with an ejection  fraction  of 70%. The cavity size was normal. The wall motion is normal.  _ Right Ventricle: normal function. The cavity was normal. The wall motion  is  normal.  _ Aorta: there is no dissection present in the aorta.  _ Aortic Valve: The aortic valve appears unchanged from pre-bypass.  _ Mitral Valve: There is mild regurgitation.  _ Tricuspid Valve: There is mild regurgitation.   LHC 10/2021:    Mid LM to Prox LAD lesion is 95% stenosed with 99% stenosed side branch in Ost Cx.  Ramus lesion is 95% stenosed. - Trifucation lesion   Mid LAD stent is 5% stenosed.  Dist LAD stent is 10% stenosed.   Prox RCA lesion is 45% stenosed.   The left ventricular systolic function is normal.  The left ventricular ejection fraction is 50-55% by visual estimate.   There is no aortic valve stenosis.   ----------------------- Severe distal LM-trifurcation  LAD, RI, LCx 95-99 % eccentric stenosis with minimal downstream disease. LAD has mid and distal vessel stent with diffuse calcification.  Stents are widely patent.  LAD reaches the apex.  1 very proximal ramus like small diagonal branch followed by 2 additional diagonal branches.  Too small for bypass. Ramus or medius is a large-caliber vessel that reaches the apex with distal branches. LCx courses as of the lateral OM/LPL distally after giving rise to small to moderate-sized OM1, OM 2 and OM 4 with minuscule OM 3.  Too small for grafting. RCA has proximal eccentric 45 to 50% stenosis with minimal distal disease giving rise to RPDA and 2 PL branches.  No significant disease in the RCA system. Preserved LVEF with mild mid to apical anterior hypokinesis. Hemodynamics:  Central AoP: 176/89 with a MAP 124 mmHg LVP/EDP: 172/5-15 mmHg   Recommendations: Urgent/emergent CVTS consultation with Dr. Sherene Dilling performed in the Cath Lab.  Plan is for emergent CABG Continue IABP via RFA Radial sheath sutured in place to be used for arterial line during procedure.  Removal in the CVICU post CABG with TR band placed by catheter supervisor.  Echo 07/2021:   1. Left ventricular ejection fraction, by estimation, is 60 to 65%. The  left ventricle has normal function. The left ventricle has no regional  wall motion abnormalities. There is mild left ventricular hypertrophy.  Left ventricular diastolic  parameters  are consistent with Grade I diastolic dysfunction (impaired relaxation).   2. Right ventricular systolic function is normal. The right ventricular  size is normal. Tricuspid regurgitation signal is inadequate for assessing  PA pressure.   3. The mitral valve is grossly normal. Trivial mitral valve  regurgitation.   4. The aortic valve is tricuspid. Aortic valve regurgitation is not  visualized.   5. The inferior vena cava is normal in size with greater than 50%  respiratory variability, suggesting right  atrial pressure of 3 mmHg.   Comparison(s): No significant change from prior study.  Physical Exam:   VS:  There were no vitals taken for this visit.   Wt Readings from Last 3 Encounters:  10/24/23 208 lb 15.9 oz (94.8 kg)  10/04/23 209 lb (94.8 kg)  08/31/23 213 lb 3 oz (96.7 kg)    GEN: Obese, 73 y.o. female in no acute distress, but appears to be in pain NECK: No JVD; No carotid bruits CARDIAC: S1/S2, RRR, no murmurs, rubs, gallops, small tennis ball sized lump to left lower lateral breast tissue. Keloid to sternal incision scar.  RESPIRATORY:  Clear to auscultation without rales, wheezing or rhonchi  ABDOMEN: Soft, non-tender, non-distended EXTREMITIES:  No edema; No deformity   ASSESSMENT AND PLAN: .    CAD, s/p CABG Stable with no anginal symptoms. No indication for ischemic evaluation. Continue current medication regimen. Heart healthy diet and regular cardiovascular exercise encouraged.   HTN BP elevated on arrival in setting of acute pain. BP on recheck during exam was at goal. Discussed to monitor BP at home at least 2 hours after medications and sitting for 5-10 minutes. No medication changes at this time. Heart healthy diet and regular cardiovascular exercise encouraged. Continue to follow with PCP.  Breast cancer, lump of left breast Following Oncology. Patient has noticed lump along left breast. See physical exam noted above. Will route note to PCP and to Oncology. Continue to follow-up with Oncology.   Mixed HLD LDL 74 in 2023. Goal LDL < 55. Being managed by PCP. Recommend increasing Crestor  to 20 mg daily if future labs show LDL is not at goal. Heart healthy diet and regular cardiovascular exercise encouraged.    Dispo: Follow-up with Dr. Londa Rival or APP in 6 months or sooner if anything changes.   Signed, Lasalle Pointer, NP

## 2023-11-14 ENCOUNTER — Other Ambulatory Visit: Payer: Self-pay | Admitting: Neurology

## 2023-11-14 ENCOUNTER — Other Ambulatory Visit: Payer: Self-pay | Admitting: Hematology

## 2023-11-17 ENCOUNTER — Ambulatory Visit: Payer: Medicare (Managed Care) | Admitting: Neurology

## 2023-11-22 ENCOUNTER — Encounter: Payer: Self-pay | Admitting: Neurology

## 2023-11-22 ENCOUNTER — Ambulatory Visit: Payer: Medicare (Managed Care) | Admitting: Neurology

## 2023-11-22 ENCOUNTER — Telehealth: Payer: Self-pay | Admitting: Neurology

## 2023-11-22 VITALS — BP 129/75 | HR 58 | Ht 64.0 in | Wt 204.4 lb

## 2023-11-22 DIAGNOSIS — R4189 Other symptoms and signs involving cognitive functions and awareness: Secondary | ICD-10-CM | POA: Diagnosis not present

## 2023-11-22 DIAGNOSIS — G8929 Other chronic pain: Secondary | ICD-10-CM

## 2023-11-22 DIAGNOSIS — R519 Headache, unspecified: Secondary | ICD-10-CM | POA: Diagnosis not present

## 2023-11-22 DIAGNOSIS — M545 Low back pain, unspecified: Secondary | ICD-10-CM

## 2023-11-22 MED ORDER — GABAPENTIN 300 MG PO CAPS: 300.0000 mg | ORAL_CAPSULE | Freq: Three times a day (TID) | ORAL | 3 refills | Status: DC

## 2023-11-22 MED ORDER — ALPRAZOLAM 0.5 MG PO TABS: ORAL_TABLET | ORAL | 0 refills | Status: DC

## 2023-11-22 NOTE — Telephone Encounter (Signed)
Referral for pain clinic fax to [facility]. Phone: [phone], Fax: [phone]. 

## 2023-11-22 NOTE — Progress Notes (Addendum)
 Chief Complaint  Patient presents with   Memory Loss    Rm14, caregiver present, memory loss: MMSE SCORE OF 24   Back Pain    Rm14, caregiver present, BACK PAIN: pt stated that its progressively worsening and sometimes debilitating and painful        ASSESSMENT AND PLAN  Katelyn Kaufman is a 73 y.o. female   Mild cognitive impairment New onset headache  Mini-Mental Status Examination was 23/30,  May indicate underlying central nervous system degenerative disorder, her mood disorder chronic pain, polypharmacy certainly contributed to her difficulty too,  ESR C-reactive protein to rule out temporal arteritis  MRI of brain  Depression anxiety diffuse body achy pain, worsening low back pain,  Already on polypharmacy treatment, including Lyrica , Cymbalta , Celexa, will try higher dose of Lyrica  50 mg 3 times a day,Celexa 15 mg twice daily as needed,  MRI of lumbar in May 2023 showed multilevel degenerative changes, moderate severe canal stenosis L2-3, L3-4, variable degree of foraminal narrowing,  Will refer her to [place] [facility]   DIAGNOSTIC DATA (LABS, IMAGING, TESTING) - I reviewed patient records, labs, notes, testing and imaging myself where available.   MEDICAL HISTORY:  Katelyn Kaufman 73 year old female, accompanied by her sister, seen in request by her primary care doctor Verdie Scull for evaluation of memory loss, diffuse body achy pain, depression anxiety   History is obtained from the patient and review of electronic medical records. I personally reviewed pertinent available imaging films in PACS.   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, grade 2, negative margin, also had incision and debridement of left mastectomy wound in March 2024,  She now complains of depression anxiety, not feeling well, could not sleep, memory loss, Mini-Mental Status Examination 23/30, denied family history of dementia,  CT head in September 2024, no acute abnormality, mild small vessel disease,  MRI lumbar in May 2023, multilevel disc facet degeneration, moderate severe stenosis at L2-3, moderate at L3-4, variable degree of foraminal narrowing,  UPDATE November 22 2023: She has live in caregiver, with her at today's visit  She complains of chronic low back pain, charles horses radiating pain to her legs, more to right, difficult to bear weight,  She has tried different medications in the past, on polypharmacy including Cymbalta , Lyrica , Celexa with limited help,  She also complains of new onset headaches the past couple years, not sleeping well, could not stay in sleep.  Could not find a comfortable position, slow worsening memory loss, MoCA examination 24/30    PHYSICAL EXAM:   Vitals:   11/22/23 1102 11/22/23 1107  BP: (!) 143/83 129/75  Pulse: (!) 58   Weight: 204 lb 6.4 oz (92.7 kg)   Height: 5' 4 (1.626 m)    Body mass index is 35.09 kg/m.  PHYSICAL EXAMNIATION:  Gen: NAD, conversant, well nourised, well groomed                     Cardiovascular: Regular  rate rhythm, no peripheral edema, warm, nontender. Eyes: Conjunctivae clear without exudates or hemorrhage Neck: Supple, no carotid bruits. Pulmonary: Clear to auscultation bilaterally   NEUROLOGICAL EXAM:  MENTAL STATUS: Speech/cognition: Depressed looking elderly female, oriented to history taking and casual conversation    11/22/2023   11:08 AM 05/31/2023    3:17 PM  MMSE -  Mini Mental State Exam  Orientation to time 5 5  Orientation to Place 3 4  Registration 3 3  Attention/ Calculation 5 1  Recall 1 2  Language- name 2 objects 2 2  Language- repeat 1 1  Language- follow 3 step command 2 3  Language- read & follow direction 1 1  Write a sentence 1 1  Copy design 0 0  Total score 24 23    CRANIAL NERVES: CN II: Visual fields are full to confrontation. Pupils are round equal and briskly reactive to light. CN III, IV, VI: extraocular movement are normal. No ptosis. CN V: Facial sensation is intact to light touch CN VII: Face is symmetric with normal eye closure  CN VIII: Hearing is normal to causal conversation. CN IX, X: Phonation is normal. CN XI: Head turning and shoulder shrug are intact  MOTOR: There is no pronator drift of out-stretched arms. Muscle bulk and tone are normal. Muscle strength is normal.  REFLEXES: Reflexes are 1 and symmetric at the biceps, triceps, knees, and absent ankles. Plantar responses are flexor.  SENSORY: Length-dependent decreased light touch, pinprick to mid shin level  COORDINATION: There is no trunk or limb dysmetria noted.  GAIT/STANCE: Push-up to get up from seated position, cautious, antalgic  REVIEW OF SYSTEMS:  Full 14 system review of systems performed and notable only for as above All other review of systems were negative.   ALLERGIES: Allergies  Allergen Reactions   Latex Rash    HOME MEDICATIONS: Current Outpatient Medications  Medication Sig Dispense Refill   acetaminophen  (TYLENOL ) 325 MG tablet Take 2 tablets (650 mg total) by mouth every 6 (six) hours as needed for mild pain (or Fever >/= 101). 100 tablet 2   amLODipine  (NORVASC ) 5 MG tablet Take 1 tablet (5 mg total) by mouth daily. 30 tablet 1   anastrozole  (ARIMIDEX ) 1 MG tablet TAKE 1 TABLET(1 MG) BY MOUTH DAILY 90 tablet 3   Ascorbic Acid  (VITAMIN C ) 1000 MG tablet Take 1,000 mg by mouth daily.     aspirin  EC 81 MG tablet Take 1  tablet (81 mg total) by mouth daily. Swallow whole. 30 tablet 12   buprenorphine  (SUBUTEX ) 2 MG SUBL SL tablet Place 4 mg under the tongue every 8 (eight) hours.     celecoxib  (CELEBREX ) 50 MG capsule TAKE 1 CAPSULE(50 MG) BY MOUTH TWICE DAILY AS NEEDED FOR PAIN 60 capsule 1   cholecalciferol  (VITAMIN D3) 25 MCG (1000 UNIT) tablet Take 1,000 Units by mouth daily.     cyclobenzaprine  (FLEXERIL ) 10 MG tablet Take 10 mg by mouth 2 (two) times daily as needed for muscle spasms.     DULoxetine  (CYMBALTA ) 60 MG capsule Take 60 mg by mouth daily.     ezetimibe  (ZETIA ) 10 MG tablet Take 1 tablet (10 mg total) by mouth daily. 30 tablet 1   fexofenadine (ALLEGRA) 180 MG tablet Take 180 mg by mouth daily.     furosemide  (LASIX ) 40 MG tablet TAKE 1 AND 1/2 TABLETS(60 MG) BY MOUTH DAILY 135 tablet 2   gabapentin  (NEURONTIN ) 100 MG capsule Take 1 capsule (100 mg total) by  mouth 3 (three) times daily as needed. 90 capsule 0   glimepiride  (AMARYL ) 4 MG tablet Take 4 mg by mouth daily with breakfast.     hydrOXYzine  (ATARAX ) 25 MG tablet TAKE 1 TABLET(25 MG) BY MOUTH DAILY 30 tablet 0   insulin  glargine (LANTUS ) 100 UNIT/ML injection Inject 1 Units into the skin at bedtime.     isosorbide  mononitrate (IMDUR ) 30 MG 24 hr tablet Take 1 tablet (30 mg total) by mouth daily. 30 tablet 2   losartan  (COZAAR ) 100 MG tablet Take 100 mg by mouth daily.     metFORMIN  (GLUCOPHAGE -XR) 500 MG 24 hr tablet Take 1,000 mg by mouth 2 (two) times daily with a meal.     metoprolol  tartrate (LOPRESSOR ) 25 MG tablet Take 1 tablet (25 mg total) by mouth 2 (two) times daily. 60 tablet 1   nitroGLYCERIN  (NITROSTAT ) 0.4 MG SL tablet Place 1 tablet (0.4 mg total) under the tongue every 5 (five) minutes x 3 doses as needed for chest pain (if no relief after 3rd dose, proceed to ED or call 911). 75 tablet 1   ondansetron  (ZOFRAN -ODT) 4 MG disintegrating tablet Take 1 tablet (4 mg total) by mouth every 8 (eight) hours as needed for vomiting or  nausea. For 7 days supply 20 tablet 2   potassium chloride  SA (KLOR-CON  M) 20 MEQ tablet Take 1 tablet (20 mEq total) by mouth daily. Please resume on 10/31/2023     pregabalin  (LYRICA ) 50 MG capsule Take 1 capsule (50 mg total) by mouth 3 (three) times daily as needed. 90 capsule 5   rosuvastatin  (CRESTOR ) 20 MG tablet Take 1 tablet (20 mg total) by mouth daily. 90 tablet 3   vitamin B-12 (CYANOCOBALAMIN ) 1000 MCG tablet Take 1,000 mcg by mouth daily.     No current facility-administered medications for this visit.    PAST MEDICAL HISTORY: Past Medical History:  Diagnosis Date   Allergy    Anxiety    Arthritis    Breast cancer (HCC)    CAD (coronary artery disease) 2018   Severe trifurcation disease involving left main, ostial circumflex, 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   COPD (chronic obstructive pulmonary disease) (HCC)    Depression    Essential hypertension    Hyperlipidemia    Myocardial infarction (HCC)    [place] to the LAD   OSA (obstructive sleep apnea)    CPAP qHS   PONV (postoperative nausea and vomiting)    Type 2 diabetes mellitus (HCC)     PAST SURGICAL HISTORY: Past Surgical History:  Procedure Laterality Date   BACK SURGERY     BREAST SURGERY     CATARACT EXTRACTION W/PHACO Right 10/24/2020   Procedure: CATARACT EXTRACTION PHACO AND INTRAOCULAR LENS PLACEMENT (IOC);  Surgeon: Harrie Agent, MD;  Location: [facility];  Service: Ophthalmology;  Laterality: Right;  CDE: 10.87   CATARACT EXTRACTION W/PHACO Left 12/01/2020   Procedure: CATARACT EXTRACTION PHACO AND INTRAOCULAR LENS PLACEMENT LEFT EYE;  Surgeon: Harrie Agent, MD;  Location: [facility];  Service: Ophthalmology;  Laterality: Left;  CDE  8.62   CORONARY ARTERY BYPASS GRAFT N/A 11/16/2021   Procedure: CORONARY ARTERY BYPASS GRAFTING (CABG) times three using the left internal mammary and right saphenous vein.;  Surgeon: Lucas Dorise POUR, MD;  Location: [facility] OR;  Service: Open Heart Surgery;  Laterality: N/A;   IABP INSERTION N/A 11/16/2021   Procedure: IABP Insertion;  Surgeon: Anner Alm ORN, MD;  Location: [facility] INVASIVE CV LAB;  Service: Cardiovascular;  Laterality: N/A;   LEFT HEART CATH AND CORONARY ANGIOGRAPHY N/A 11/16/2021   Procedure: LEFT HEART CATH AND CORONARY ANGIOGRAPHY;  Surgeon: Anner Alm ORN, MD;  Location: [facility] INVASIVE CV LAB;  Service: Cardiovascular;  Laterality: N/A;   REPLACEMENT TOTAL KNEE Right    SIMPLE MASTECTOMY WITH AXILLARY SENTINEL NODE BIOPSY Left 06/25/2022   Procedure: SIMPLE MASTECTOMY;  Surgeon: Mavis Anes, MD;  Location: [facility];  Service: General;  Laterality: Left;   TEE WITHOUT CARDIOVERSION N/A 11/16/2021   Procedure: TRANSESOPHAGEAL ECHOCARDIOGRAM (TEE);  Surgeon: Lucas Dorise POUR, MD;  Location[facility] OR;  Service: Open Heart Surgery;  Laterality: N/A;   TUBAL LIGATION     WOUND EXPLORATION Left 07/26/2022   Procedure: EVACUATION OF HEMATOMA BREAST, DEBRIDEMENT OF LEFT BREAST WOUND;  Surgeon: Mavis Anes, MD;  Location: [facility];  Service: General;  Laterality: Left;    FAMILY HISTORY: Family History  Problem Relation Age of Onset   COPD Mother    Alcohol abuse Father    Hypertension Sister    Heart attack Brother     SOCIAL HISTORY: Social History   Socioeconomic History   Marital status: Widowed    Spouse name: Not on file   Number of children: Not on file   Years of education: Not on file   Highest education level: Not on file  Occupational History   Not on file  Tobacco Use   Smoking status: Every Day    Current packs/day: 0.50    Average packs/day: 0.5 packs/day for 40.0 years (20.0 ttl pk-yrs)    Types: Cigarettes   Smokeless tobacco: Never  Vaping Use   Vaping status: Never Used  Substance and Sexual Activity   Alcohol use: Never   Drug use: Never   Sexual activity: Not Currently  Other Topics Concern   Not on file  Social History Narrative   Not on  file   Social Drivers of Health   Financial Resource Strain: Low Risk  (02/10/2023)   Received from [facility]   Overall Financial Resource Strain (CARDIA)    Difficulty of Paying Living Expenses: Not hard at all  Recent Concern: Financial Resource Strain - Medium Risk (11/24/2022)   Received from [facility]   Overall Financial Resource Strain (CARDIA)    Difficulty of Paying Living Expenses: Somewhat hard  Food Insecurity: No Food Insecurity (10/24/2023)   Hunger Vital Sign    Worried About Running Out of Food in the Last Year: Never true    Ran Out of Food in the Last Year: Never true  Transportation Needs: No Transportation Needs (10/24/2023)   PRAPARE - Administrator, Civil Service (Medical): No    Lack of Transportation (Non-Medical): No  Physical Activity: Insufficiently Active (12/30/2022)   Received from [facility]   Exercise Vital Sign    On average, how many days per week do you engage in moderate to strenuous exercise (like a brisk walk)?: 4 days    On average, how many minutes do you engage in exercise at this level?: 30 min  Stress: Stress Concern Present (02/10/2023)   Received from [facility] - Occupational Stress Questionnaire    Feeling of Stress : To some extent  Social Connections: Moderately Integrated (10/24/2023)   Social Connection and Isolation Panel    Frequency of Communication with Friends and Family:  More than three times a week    Frequency of Social Gatherings with Friends and Family: More than three times a week    Attends Religious Services: More than 4 times per year    Active Member of Clubs or Organizations: No    Attends Engineer, structural: More than 4 times per year    Marital Status: Widowed  Intimate Partner Violence: Not At Risk (10/24/2023)   Humiliation, Afraid, Rape, and Kick questionnaire    Fear of Current or Ex-Partner: No    Emotionally Abused: No     Physically Abused: No    Sexually Abused: No      Modena Callander, M.D. Ph.D.  [facility] [address], [address] [place], [place] [address] Ph: [phone] Fax: [phone]  CC:  Verdie Johann LABOR, DO [address] [place],  [place] [address]  Verdie Johann A, DO

## 2023-11-23 ENCOUNTER — Ambulatory Visit: Payer: Self-pay | Admitting: Neurology

## 2023-11-23 LAB — SEDIMENTATION RATE: Sed Rate: 4 mm/h (ref 0–40)

## 2023-11-23 LAB — C-REACTIVE PROTEIN: CRP: <1 mg/L (ref 0–10)

## 2023-11-24 ENCOUNTER — Telehealth: Payer: Self-pay

## 2023-11-24 NOTE — Telephone Encounter (Signed)
Called patient and reviewed lab results. Patient verbalized understanding.  

## 2023-11-30 ENCOUNTER — Ambulatory Visit: Admitting: Nurse Practitioner

## 2023-11-30 ENCOUNTER — Ambulatory Visit (HOSPITAL_COMMUNITY): Admission: RE | Admit: 2023-11-30 | Source: Ambulatory Visit

## 2023-12-01 ENCOUNTER — Ambulatory Visit (HOSPITAL_COMMUNITY)

## 2023-12-22 NOTE — Progress Notes (Signed)
 VM received from [facility] physical therapist reporting that patient told him she noted new blood coming from her L breast. VM requested that we call patient to schedule sooner appt. Attempted to reach patient directly and was unable to reach the patient. Detailed VM left asking that the patient return my call to schedule an appt.

## 2023-12-23 ENCOUNTER — Other Ambulatory Visit: Payer: Self-pay | Admitting: Hematology

## 2023-12-29 ENCOUNTER — Ambulatory Visit (HOSPITAL_COMMUNITY)
Admission: RE | Admit: 2023-12-29 | Discharge: 2023-12-29 | Disposition: A | Discharge status: Home / Self Care | Source: Ambulatory Visit | Attending: Nephrology | Admitting: Nephrology

## 2023-12-29 ENCOUNTER — Ambulatory Visit (HOSPITAL_COMMUNITY)
Admission: RE | Admit: 2023-12-29 | Discharge: 2023-12-29 | Disposition: A | Discharge status: Home / Self Care | Source: Ambulatory Visit | Attending: Neurology | Admitting: Neurology

## 2023-12-29 DIAGNOSIS — R519 Headache, unspecified: Secondary | ICD-10-CM | POA: Diagnosis present

## 2023-12-29 DIAGNOSIS — M545 Low back pain, unspecified: Secondary | ICD-10-CM | POA: Diagnosis present

## 2023-12-29 DIAGNOSIS — G8929 Other chronic pain: Secondary | ICD-10-CM | POA: Insufficient documentation

## 2023-12-29 DIAGNOSIS — E1142 Type 2 diabetes mellitus with diabetic polyneuropathy: Secondary | ICD-10-CM

## 2023-12-29 DIAGNOSIS — N1832 Chronic kidney disease, stage 3b: Secondary | ICD-10-CM

## 2023-12-29 DIAGNOSIS — R4189 Other symptoms and signs involving cognitive functions and awareness: Secondary | ICD-10-CM | POA: Insufficient documentation

## 2023-12-29 DIAGNOSIS — R809 Proteinuria, unspecified: Secondary | ICD-10-CM | POA: Diagnosis present

## 2023-12-29 DIAGNOSIS — E1129 Type 2 diabetes mellitus with other diabetic kidney complication: Secondary | ICD-10-CM

## 2023-12-30 ENCOUNTER — Telehealth: Payer: Self-pay | Admitting: Neurology

## 2023-12-30 NOTE — Telephone Encounter (Signed)
 Please call patient, MRI of the brain showed no acute abnormality, there is mild age-related changes, also evidence of small vessel disease Continue plan as discussed during office visit, work with her primary care and pain management for better control of depression anxiety and body achy pain  IMPRESSION: 1.  No evidence of an acute intracranial abnormality. 2. No age-advanced or lobar predominant cerebral atrophy. 3. Mild chronic small vessel ischemic changes within supratentorial white matter.

## 2023-12-30 NOTE — Telephone Encounter (Signed)
 Call to patient, reviewed results. She verbalized understanding and in agreement. She reports appts have already been scheduled.

## 2024-01-09 ENCOUNTER — Other Ambulatory Visit (HOSPITAL_COMMUNITY)
Admission: RE | Admit: 2024-01-09 | Discharge: 2024-01-09 | Disposition: A | Discharge status: Home / Self Care | Source: Ambulatory Visit | Attending: Nephrology | Admitting: Nephrology

## 2024-01-09 ENCOUNTER — Other Ambulatory Visit (HOSPITAL_COMMUNITY)
Admission: RE | Admit: 2024-01-09 | Discharge: 2024-01-09 | Disposition: A | Discharge status: Home / Self Care | Source: Ambulatory Visit | Attending: Nurse Practitioner | Admitting: Nurse Practitioner

## 2024-01-09 DIAGNOSIS — R778 Other specified abnormalities of plasma proteins: Secondary | ICD-10-CM | POA: Insufficient documentation

## 2024-01-09 DIAGNOSIS — Z79899 Other long term (current) drug therapy: Secondary | ICD-10-CM | POA: Insufficient documentation

## 2024-01-09 DIAGNOSIS — R809 Proteinuria, unspecified: Secondary | ICD-10-CM | POA: Insufficient documentation

## 2024-01-09 DIAGNOSIS — E782 Mixed hyperlipidemia: Secondary | ICD-10-CM | POA: Insufficient documentation

## 2024-01-09 DIAGNOSIS — N189 Chronic kidney disease, unspecified: Secondary | ICD-10-CM | POA: Insufficient documentation

## 2024-01-09 DIAGNOSIS — N179 Acute kidney failure, unspecified: Secondary | ICD-10-CM | POA: Insufficient documentation

## 2024-01-09 LAB — CBC
HCT: 39.6 % (ref 36.0–46.0)
HCT: 39.5 % (ref 36.0–46.0)
Hemoglobin: 13.5 g/dL (ref 12.0–15.0)
Hemoglobin: 13.5 g/dL (ref 12.0–15.0)
MCH: 31.3 pg (ref 26.0–34.0)
MCH: 31.5 pg (ref 26.0–34.0)
MCHC: 34.1 g/dL (ref 30.0–36.0)
MCHC: 34.2 g/dL (ref 30.0–36.0)
MCV: 91.9 fL (ref 80.0–100.0)
MCV: 92.3 fL (ref 80.0–100.0)
Platelets: 248 K/uL (ref 150–400)
Platelets: 251 K/uL (ref 150–400)
RBC: 4.31 MIL/uL (ref 3.87–5.11)
RBC: 4.28 MIL/uL (ref 3.87–5.11)
RDW: 13.3 % (ref 11.5–15.5)
RDW: 13.5 % (ref 11.5–15.5)
WBC: 6.7 K/uL (ref 4.0–10.5)
WBC: 6.7 K/uL (ref 4.0–10.5)
nRBC: 0 % (ref 0.0–0.2)
nRBC: 0 % (ref 0.0–0.2)

## 2024-01-09 LAB — MAGNESIUM: Magnesium: 2.2 mg/dL (ref 1.7–2.4)

## 2024-01-09 LAB — IRON AND TIBC
Iron: 122 ug/dL (ref 28–170)
Saturation Ratios: 29 % (ref 10.4–31.8)
TIBC: 428 ug/dL (ref 250–450)
UIBC: 306 ug/dL

## 2024-01-09 LAB — HEPATITIS C ANTIBODY: HCV Ab: NONREACTIVE

## 2024-01-09 LAB — BASIC METABOLIC PANEL WITH GFR
Anion gap: 14 (ref 5–15)
BUN: 34 mg/dL — ABNORMAL HIGH (ref 8–23)
CO2: 24 mmol/L (ref 22–32)
Calcium: 11 mg/dL — ABNORMAL HIGH (ref 8.9–10.3)
Chloride: 93 mmol/L — ABNORMAL LOW (ref 98–111)
Creatinine, Ser: 1.88 mg/dL — ABNORMAL HIGH (ref 0.44–1.00)
GFR, Estimated: 28 mL/min — ABNORMAL LOW (ref >60)
Glucose, Bld: 216 mg/dL — ABNORMAL HIGH (ref 70–99)
Potassium: 3.3 mmol/L — ABNORMAL LOW (ref 3.5–5.1)
Sodium: 131 mmol/L — ABNORMAL LOW (ref 135–145)

## 2024-01-09 LAB — CREATININE, URINE, 24 HOUR
Collection Interval-UCRE24: 24 h
Creatinine, 24H Ur: 357 mg/d — ABNORMAL LOW (ref 600–1800)
Creatinine, Urine: 51 mg/dL
Urine Total Volume-UCRE24: 700 mL

## 2024-01-09 LAB — URINALYSIS, W/ REFLEX TO CULTURE (INFECTION SUSPECTED)
Bacteria, UA: NONE SEEN
Bilirubin Urine: NEGATIVE
Glucose, UA: 50 mg/dL — AB
Hgb urine dipstick: NEGATIVE
Ketones, ur: NEGATIVE mg/dL
Nitrite: NEGATIVE
Protein, ur: 100 mg/dL — AB
Specific Gravity, Urine: 1.012 (ref 1.005–1.030)
pH: 5 (ref 5.0–8.0)

## 2024-01-09 LAB — LIPID PANEL
Cholesterol: 93 mg/dL (ref 0–200)
HDL: 37 mg/dL — ABNORMAL LOW (ref >40)
LDL Cholesterol: 38 mg/dL (ref 0–99)
Total CHOL/HDL Ratio: 2.5 ratio
Triglycerides: 92 mg/dL (ref <150)
VLDL: 18 mg/dL (ref 0–40)

## 2024-01-09 LAB — RENAL FUNCTION PANEL
Albumin: 4.3 g/dL (ref 3.5–5.0)
Anion gap: 11 (ref 5–15)
BUN: 33 mg/dL — ABNORMAL HIGH (ref 8–23)
CO2: 26 mmol/L (ref 22–32)
Calcium: 11.1 mg/dL — ABNORMAL HIGH (ref 8.9–10.3)
Chloride: 95 mmol/L — ABNORMAL LOW (ref 98–111)
Creatinine, Ser: 1.91 mg/dL — ABNORMAL HIGH (ref 0.44–1.00)
GFR, Estimated: 27 mL/min — ABNORMAL LOW (ref >60)
Glucose, Bld: 216 mg/dL — ABNORMAL HIGH (ref 70–99)
Phosphorus: 2.5 mg/dL (ref 2.5–4.6)
Potassium: 3.3 mmol/L — ABNORMAL LOW (ref 3.5–5.1)
Sodium: 132 mmol/L — ABNORMAL LOW (ref 135–145)

## 2024-01-09 LAB — HEMOGLOBIN A1C
Hgb A1c MFr Bld: 8.7 % — ABNORMAL HIGH (ref 4.8–5.6)
Mean Plasma Glucose: 202.99 mg/dL

## 2024-01-09 LAB — FERRITIN: Ferritin: 61 ng/mL (ref 11–307)

## 2024-01-09 LAB — VITAMIN D 25 HYDROXY (VIT D DEFICIENCY, FRACTURES): Vit D, 25-Hydroxy: 66.51 ng/mL (ref 30–100)

## 2024-01-09 LAB — FOLATE: Folate: 14.5 ng/mL (ref >5.9)

## 2024-01-09 LAB — HEPATITIS B SURFACE ANTIBODY,QUALITATIVE: Hep B S Ab: NONREACTIVE

## 2024-01-09 LAB — URIC ACID: Uric Acid, Serum: 8.1 mg/dL — ABNORMAL HIGH (ref 2.5–7.1)

## 2024-01-09 LAB — PROTEIN, URINE, 24 HOUR
Collection Interval-UPROT: 24 h
Protein, Urine: <6 mg/dL
Urine Total Volume-UPROT: 700 mL

## 2024-01-09 LAB — HEPATITIS B SURFACE ANTIGEN: Hepatitis B Surface Ag: NONREACTIVE

## 2024-01-09 LAB — VITAMIN B12: Vitamin B-12: 2685 pg/mL — ABNORMAL HIGH (ref 180–914)

## 2024-01-10 LAB — HCV INTERPRETATION

## 2024-01-10 LAB — MISC LABCORP TEST (SEND OUT): Labcorp test code: 121137

## 2024-01-10 LAB — CALCITRIOL (1,25 DI-OH VIT D): Vit D, 1,25-Dihydroxy: 52.4 pg/mL (ref 24.8–81.5)

## 2024-01-10 LAB — PARATHYROID HORMONE, INTACT (NO CA): PTH: 65 pg/mL (ref 15–65)

## 2024-01-10 LAB — HIV-1/HIV-2 QUAL RNA
HIV-1 RNA, Qualitative: NONREACTIVE
HIV-2 RNA, Qualitative: NONREACTIVE

## 2024-01-10 LAB — HCV AB W REFLEX TO QUANT PCR: HCV Ab: NONREACTIVE

## 2024-01-11 LAB — IMMUNOFIXATION, URINE

## 2024-01-11 LAB — MISC LABCORP TEST (SEND OUT): Labcorp test code: 141330

## 2024-01-12 LAB — MISC LABCORP TEST (SEND OUT): Labcorp test code: 1495

## 2024-01-16 LAB — CALCIUM, URINE, 24 HOUR
Calcium, 24 hour urine: 27 mg/(24.h) — ABNORMAL LOW (ref 0–320)
Calcium, Ur: 3.8 mg/dL
Total Volume: 700

## 2024-01-16 LAB — PTH-RELATED PEPTIDE: PTH-related peptide: <2 pmol/L

## 2024-01-20 ENCOUNTER — Other Ambulatory Visit: Payer: Self-pay | Admitting: Cardiology

## 2024-01-21 ENCOUNTER — Other Ambulatory Visit: Payer: Self-pay | Admitting: Neurology

## 2024-01-24 ENCOUNTER — Other Ambulatory Visit: Payer: Self-pay | Admitting: *Deleted

## 2024-01-24 MED ORDER — HYDROXYZINE HCL 25 MG PO TABS: 25.0000 mg | ORAL_TABLET | Freq: Every day | ORAL | 0 refills | Status: DC

## 2024-02-14 ENCOUNTER — Ambulatory Visit: Payer: Self-pay | Admitting: Nurse Practitioner

## 2024-02-14 DIAGNOSIS — N1832 Chronic kidney disease, stage 3b: Secondary | ICD-10-CM

## 2024-02-14 DIAGNOSIS — E876 Hypokalemia: Secondary | ICD-10-CM

## 2024-02-14 DIAGNOSIS — Z79899 Other long term (current) drug therapy: Secondary | ICD-10-CM

## 2024-02-15 MED ORDER — POTASSIUM CHLORIDE CRYS ER 20 MEQ PO TBCR: 40.0000 meq | EXTENDED_RELEASE_TABLET | Freq: Every day | ORAL | 0 refills | Status: DC

## 2024-02-23 ENCOUNTER — Other Ambulatory Visit: Payer: Self-pay | Admitting: Physician Assistant

## 2024-02-23 ENCOUNTER — Inpatient Hospital Stay: Attending: Physician Assistant

## 2024-02-23 DIAGNOSIS — M8580 Other specified disorders of bone density and structure, unspecified site: Secondary | ICD-10-CM | POA: Insufficient documentation

## 2024-02-23 DIAGNOSIS — Z1732 Human epidermal growth factor receptor 2 negative status: Secondary | ICD-10-CM | POA: Insufficient documentation

## 2024-02-23 DIAGNOSIS — R7989 Other specified abnormal findings of blood chemistry: Secondary | ICD-10-CM | POA: Insufficient documentation

## 2024-02-23 DIAGNOSIS — N184 Chronic kidney disease, stage 4 (severe): Secondary | ICD-10-CM | POA: Diagnosis not present

## 2024-02-23 DIAGNOSIS — Z9012 Acquired absence of left breast and nipple: Secondary | ICD-10-CM | POA: Insufficient documentation

## 2024-02-23 DIAGNOSIS — Z79899 Other long term (current) drug therapy: Secondary | ICD-10-CM | POA: Diagnosis not present

## 2024-02-23 DIAGNOSIS — Z170 Estrogen receptor positive status [ER+]: Secondary | ICD-10-CM | POA: Diagnosis not present

## 2024-02-23 DIAGNOSIS — Z1231 Encounter for screening mammogram for malignant neoplasm of breast: Secondary | ICD-10-CM

## 2024-02-23 DIAGNOSIS — Z1721 Progesterone receptor positive status: Secondary | ICD-10-CM | POA: Diagnosis not present

## 2024-02-23 DIAGNOSIS — R232 Flushing: Secondary | ICD-10-CM | POA: Diagnosis not present

## 2024-02-23 DIAGNOSIS — Z79811 Long term (current) use of aromatase inhibitors: Secondary | ICD-10-CM | POA: Insufficient documentation

## 2024-02-23 DIAGNOSIS — Z809 Family history of malignant neoplasm, unspecified: Secondary | ICD-10-CM | POA: Insufficient documentation

## 2024-02-23 DIAGNOSIS — F17210 Nicotine dependence, cigarettes, uncomplicated: Secondary | ICD-10-CM | POA: Insufficient documentation

## 2024-02-23 DIAGNOSIS — C50412 Malignant neoplasm of upper-outer quadrant of left female breast: Secondary | ICD-10-CM | POA: Diagnosis present

## 2024-02-23 LAB — CBC WITH DIFFERENTIAL/PLATELET
Abs Immature Granulocytes: 0.02 K/uL (ref 0.00–0.07)
Basophils Absolute: 0 K/uL (ref 0.0–0.1)
Basophils Relative: 0 %
Eosinophils Absolute: 0 K/uL (ref 0.0–0.5)
Eosinophils Relative: 0 %
HCT: 41.1 % (ref 36.0–46.0)
Hemoglobin: 14 g/dL (ref 12.0–15.0)
Immature Granulocytes: 0 %
Lymphocytes Relative: 31 %
Lymphs Abs: 2.4 K/uL (ref 0.7–4.0)
MCH: 32.3 pg (ref 26.0–34.0)
MCHC: 34.1 g/dL (ref 30.0–36.0)
MCV: 94.7 fL (ref 80.0–100.0)
Monocytes Absolute: 0.8 K/uL (ref 0.1–1.0)
Monocytes Relative: 10 %
Neutro Abs: 4.5 K/uL (ref 1.7–7.7)
Neutrophils Relative %: 59 %
Platelets: 255 K/uL (ref 150–400)
RBC: 4.34 MIL/uL (ref 3.87–5.11)
RDW: 14.6 % (ref 11.5–15.5)
WBC: 7.7 K/uL (ref 4.0–10.5)
nRBC: 0 % (ref 0.0–0.2)

## 2024-02-23 LAB — COMPREHENSIVE METABOLIC PANEL WITH GFR
ALT: 19 U/L (ref 0–44)
AST: 18 U/L (ref 15–41)
Albumin: 4.5 g/dL (ref 3.5–5.0)
Alkaline Phosphatase: 67 U/L (ref 38–126)
Anion gap: 13 (ref 5–15)
BUN: 39 mg/dL — ABNORMAL HIGH (ref 8–23)
CO2: 23 mmol/L (ref 22–32)
Calcium: 11.7 mg/dL — ABNORMAL HIGH (ref 8.9–10.3)
Chloride: 92 mmol/L — ABNORMAL LOW (ref 98–111)
Creatinine, Ser: 1.96 mg/dL — ABNORMAL HIGH (ref 0.44–1.00)
GFR, Estimated: 26 mL/min — ABNORMAL LOW (ref >60)
Glucose, Bld: 375 mg/dL — ABNORMAL HIGH (ref 70–99)
Potassium: 4.1 mmol/L (ref 3.5–5.1)
Sodium: 129 mmol/L — ABNORMAL LOW (ref 135–145)
Total Bilirubin: 0.4 mg/dL (ref 0.0–1.2)
Total Protein: 7.7 g/dL (ref 6.5–8.1)

## 2024-02-23 LAB — VITAMIN D 25 HYDROXY (VIT D DEFICIENCY, FRACTURES): Vit D, 25-Hydroxy: 58.66 ng/mL (ref 30–100)

## 2024-02-28 NOTE — Progress Notes (Unsigned)
 Katelyn Kaufman [address] [name], [place] [address]   Katelyn Kaufman:  Katelyn Kaufman  Katelyn Kaufman:  Katelyn Kaufman LABOR, Katelyn Kaufman [address] / [place] [address] [phone]   REASON FOR VISIT:  Follow-up for history of stage II left breast cancer (2023)  PRIOR THERAPY: - Left simple mastectomy (06/25/2022)  CURRENT THERAPY: Anastrozole  (since 08/03/2022)  BRIEF ONCOLOGIC HISTORY:   Oncology History  Breast cancer of upper-outer quadrant of left female breast (HCC)  12/10/2021 Initial Diagnosis   Breast cancer of upper-outer quadrant of left female breast (HCC)   12/10/2021 Cancer Staging   Staging form: Breast, AJCC 8th Edition - Clinical stage from 12/10/2021: Stage IIA (cT3, cN0, cM0, G2, ER+, PR+, HER2-) - Signed by Katelyn Hai, MD on 08/03/2022 Histopathologic type: Infiltrating duct carcinoma, NOS Stage prefix: Initial diagnosis Histologic grading system: 3 grade system     CANCER STAGING: Cancer Staging  Breast cancer of upper-outer quadrant of left female breast [facility]) Staging form: Breast, AJCC 8th Edition - Clinical stage from 12/10/2021: Stage IIA (cT3, cN0, cM0, G2, ER+, PR+, HER2-) - Signed by Katelyn Hai, MD on 08/03/2022   INTERVAL HISTORY:   [name], a 73 y.o. female, returns for routine follow-up of her history of stage II left breast cancer. Katelyn Kaufman was last seen on 08/31/2023 by Katelyn Kaufman.  In the interim since last visit, she was hospitalized from 10/24/2023 through 10/27/2023 for chest pain and AKI.  Chest pain workup was unremarkable, and creatinine improved at discharge.  At today's visit, she  reports feeling ***.   ***She reports ***% energy and ***% appetite.   ***She is maintaining stable weight at this time.  ***Per note in EMR, patient's home health PT called our office around 12/22/2023 to report that patient had told him that she noted new blood coming from left breast.  Cancer Center nurse  attempted to contact patient directly, but was unable to reach her and left a detailed voice message requesting return call, but never heard back from patient again.  ***Left breast blood??  ***She denies any new breast lumps or axillary lymphadenopathy. ***No new onset bone pain, chest pain, dyspnea, or abdominal pain. ***She has no new headaches, seizures, or focal neurologic deficits. ***No B symptoms such as fever, chills, night sweats, unintentional weight loss.  She continues to take *** ARIMIDEX :  Hot flashes, mood swings, weight gain, alopecia, and musculoskeletal pain ***Severe hot flashes?  (Gabapentin  previously discontinued by Katelyn Kaufman?) what else has been tried? *** Calcium  (too high!) and vitamin D ?  ***  ASSESSMENT & PLAN:  1.  Stage II (T3N0G2) left breast cancer: - Abnormal screening mammogram on 10/14/2021, followed by diagnostic mammogram on 10/27/2021 - Left breast ultrasound (11/16/2021): Irregular mass containing calcifications in the left breast at 2 o'clock position 2 cm from nipple measuring 1.7 x 0.6 x 1 cm.  In addition there are several similar-appearing masses extending from this mass towards the nipple with a mass in the left breast 2:00 retroareolar measuring 0.4 x 0.4 x 0.4 cm.  Together these masses span a distance of approximately 2.7 cm.  No lymphadenopathy in the axilla. 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 - She will underwent CABG x3 on 11/16/2021 - 06/25/2022: Left simple mastectomy - Pathology: 5.5  cm infiltrating ductal carcinoma, grade 2, margins negative, PT3 PNX - 07/26/2022: Incision and debridement of left mastectomy wound - Anastrozole  started on 08/03/2022  - - - - - - - - - - - - - - - - - - - - - - - - - - - - - - - - - - - - - - - - - - - - - - - - - - - - - - - - - - - - -  - - She is tolerating anastrozole  reasonably well, apart from hot flashes (see below)*** - Most recent screening mammogram of right breast (11/07/2023) was BI-RADS Category 1, negative - Physical exam (***): Left breast lumpectomy *** mastectomy *** site is within normal limits with tenderness which is stable.  No palpable adenopathy. - Most recent labs (02/23/2024): Normal CBC/D.  CMP with normal LFTs. -*** Other symptoms *** - PLAN: *** TBD.  *** Continue anastrozole  daily. - RTC in 6 months for repeat labs and physical exam. - Screening mammogram of right breast due 11/07/2024.  2.  Hot flashes - She has hot flashes predominantly at nighttime since starting anastrozole . - At prior visit with Dr. Katragadda she was prescribed low-dose gabapentin .  Apparently it was discontinued by her Katelyn Kaufman. - ***She reports that she cannot tolerate the hot flashes.*** - PLAN: ***   3.  Osteopenia (DEXA 12/18/2021 T-score -1.2): - DEXA/bone density from 12/18/2021 showed T-score -1.2 (osteopenia) - She takes vitamin D  supplements daily *** no calcium  due to elevation??  *** - Most recent labs (02/23/2024) shows normal vitamin D  at 58.66.  Calcium  11.7 (see below) *** - PLAN: Repeat bone density/DEXA prior to next visit.  *** - Continue vitamin D  supplementation and weightbearing exercises to improve bone strength.  4.  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  CKD stage IIIb/IV - Labs from April 2025 showed worsening creatinine 1.54 - She established care with Katelyn Kaufman in April 2025 - Most recent labs (02/23/2024) with creatinine 1.96/GFR 26 - PLAN: Continue follow-up with Katelyn Kaufman  6.  Social/family history: - She lives at home with roommate.  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  Current smoker, half pack per day for 40 years. - Brother had cancer.   PLAN SUMMARY: >> Bone density/DEXA in 6 months*** >> Labs in 6 months = CBC/D, CMP, vitamin D  >> OFFICE visit in 6 months (1 week after labs/x-ray)    REVIEW OF SYSTEMS: ***  Review of Systems - Oncology  PHYSICAL EXAM:   Performance status (ECOG): {CHL ONC ED:[id number]} *** There were no vitals filed for this visit. 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   COPD (chronic obstructive pulmonary disease) (HCC)    Depression    Essential hypertension    Hyperlipidemia    Myocardial infarction (HCC)    [place] to the LAD   OSA (obstructive sleep apnea)    CPAP qHS   PONV (postoperative nausea and vomiting)    Type 2 diabetes mellitus (HCC)    Past Surgical History:  Procedure Laterality Date   BACK SURGERY     BREAST SURGERY     CATARACT EXTRACTION W/PHACO Right 10/24/2020   Procedure: CATARACT EXTRACTION PHACO AND INTRAOCULAR LENS PLACEMENT (IOC);  Surgeon: Harrie Agent, MD;  Location: [facility];  Service:  Ophthalmology;  Laterality: Right;  CDE: 10.87   CATARACT EXTRACTION W/PHACO Left 12/01/2020   Procedure: CATARACT EXTRACTION PHACO AND INTRAOCULAR LENS PLACEMENT LEFT EYE;  Surgeon: Harrie Agent, MD;  Location: [facility];  Service: Ophthalmology;  Laterality: Left;  CDE  8.62   CORONARY ARTERY BYPASS GRAFT N/A 11/16/2021   Procedure: CORONARY ARTERY BYPASS GRAFTING (CABG) times three using the left internal mammary and right saphenous vein.;  Surgeon: Lucas Dorise POUR, MD;  Location: [facility] OR;  Service: Open Heart Surgery;  Laterality: N/A;   IABP INSERTION N/A 11/16/2021   Procedure: IABP Insertion;  Surgeon: Anner Alm ORN, MD;  Location: [facility] INVASIVE CV LAB;  Service: Cardiovascular;  Laterality: N/A;   LEFT HEART CATH AND CORONARY ANGIOGRAPHY N/A 11/16/2021   Procedure: LEFT HEART CATH AND CORONARY ANGIOGRAPHY;  Surgeon: Anner Alm ORN, MD;  Location: [facility] INVASIVE CV LAB;  Service: Cardiovascular;  Laterality: N/A;   REPLACEMENT TOTAL KNEE Right    SIMPLE MASTECTOMY WITH AXILLARY SENTINEL NODE BIOPSY Left 06/25/2022   Procedure: SIMPLE MASTECTOMY;  Surgeon: Mavis Anes, MD;  Location: [facility];  Service: General;  Laterality: Left;   TEE WITHOUT CARDIOVERSION N/A 11/16/2021   Procedure: TRANSESOPHAGEAL ECHOCARDIOGRAM (TEE);  Surgeon: Lucas Dorise POUR, MD;  Location: [facility] OR;  Service: Open Heart Surgery;  Laterality: N/A;   TUBAL LIGATION     WOUND EXPLORATION Left 07/26/2022   Procedure: EVACUATION OF HEMATOMA BREAST, DEBRIDEMENT OF LEFT BREAST WOUND;  Surgeon: Mavis Anes, MD;  Location: [facility];  Service: General;  Laterality: Left;    SOCIAL HISTORY:  Social History   Socioeconomic History   Marital status: Widowed    Spouse name: Not on file   Number of children: Not on file   Years of education: Not on file   Highest education level: Not on file  Occupational History   Not on file  Tobacco Use   Smoking status: Every Day    Current packs/day: 0.50    Average packs/day: 0.5 packs/day for  40.0 years (20.0 ttl pk-yrs)    Types: Cigarettes   Smokeless tobacco: Never  Vaping Use   Vaping status: Never Used  Substance and Sexual Activity   Alcohol use: Never   Drug use: Never   Sexual activity: Not Currently  Other Topics Concern   Not on file  Social History Narrative   Not on file   Social Drivers of Health   Financial Resource Strain: Low Risk  (02/10/2023)   Received [facility]   Overall Financial Resource  Strain (CARDIA)    Difficulty of Paying Living Expenses: Not hard at all  Recent Concern: Financial Resource Strain - Medium Risk (11/24/2022)   Received from [facility]   Overall Financial Resource Strain (CARDIA)    Difficulty of Paying Living Expenses: Somewhat hard  Food Insecurity: No Food Insecurity (12/14/2023)   Received from [facility]   Hunger Vital Sign    Within the past 12 months, you worried that your food would run out before you got the money to buy more.: Never true    Within the past 12 months, the food you bought just didn't last and you didn't have money to get more.: Never true  Transportation Needs: No Transportation Needs (02/03/2024)   Received [facility]   PRAPARE - Transportation    Lack of Transportation (Katelyn): No    Lack of Transportation (Non-Katelyn): No  Physical Activity: Insufficiently Active (12/30/2022)   Received from [facility]   Exercise Vital Sign    On average, how many days per week Katelyn Kaufman you engage in moderate to strenuous exercise (like a brisk walk)?: 4 days    On average, how many minutes Katelyn Kaufman you engage in exercise at this level?: 30 min  Stress: Stress Concern Present (12/14/2023)   Received from [facility] of Occupational [facility] - Occupational Stress Questionnaire    Katelyn Kaufman you feel stress - tense, restless, nervous, or anxious, or unable to sleep at night because your mind is troubled all the time - these days?: To some extent  Social Connections: Moderately  Integrated (10/24/2023)   Social Connection and Isolation Panel    Frequency of Communication with Friends and Family: More than three times a week    Frequency of Social Gatherings with Friends and Family: More than three times a week    Attends Religious Services: More than 4 times per year    Active Member of [employer] or Organizations: No    Attends Engineer, structural: More than 4 times per year    Marital Status: Widowed  Intimate Partner Violence: Not At Risk (02/03/2024)   Received from [facility]   Humiliation, Afraid, Rape, and Kick questionnaire    Within the last year, have you been afraid of your partner or ex-partner?: No    Within the last year, have you been humiliated or emotionally abused in other ways by your partner or ex-partner?: No    Within the last year, have you been kicked, hit, slapped, or otherwise physically hurt by your partner or ex-partner?: No    Within the last year, have you been raped or forced to have any kind of sexual activity by your partner or ex-partner?: No    FAMILY HISTORY:  Family History  Problem Relation Age of Onset   COPD Mother    Alcohol abuse Father    Hypertension Sister    Heart attack Brother     CURRENT MEDICATIONS:  Current Outpatient Medications  Medication Sig Dispense Refill   acetaminophen  (TYLENOL ) 325 MG tablet Take 2 tablets (650 mg total) by mouth every 6 (six) hours as needed for mild pain (or Fever >/= 101). 100 tablet 2   ALPRAZolam  (XANAX ) 0.5 MG tablet Take 1-2 tablets 30 minutes prior to MRI, may repeat once as needed. Must have driver. 3 tablet 0   amLODipine  (NORVASC ) 5 MG tablet Take 1 tablet (5 mg total) by mouth daily. 30 tablet 1   anastrozole  (  ARIMIDEX ) 1 MG tablet TAKE 1 TABLET(1 MG) BY MOUTH DAILY 90 tablet 3   Ascorbic Acid  (VITAMIN C ) 1000 MG tablet Take 1,000 mg by mouth daily.     aspirin  EC 81 MG tablet Take 1 tablet (81 mg total) by mouth daily. Swallow whole. 30 tablet 12    buprenorphine  (SUBUTEX ) 2 MG SUBL SL tablet Place 4 mg under the tongue every 8 (eight) hours.     celecoxib  (CELEBREX ) 50 MG capsule TAKE 1 CAPSULE(50 MG) BY MOUTH TWICE DAILY AS NEEDED FOR PAIN 180 capsule 1   cholecalciferol  (VITAMIN D3) 25 MCG (1000 UNIT) tablet Take 1,000 Units by mouth daily.     cyclobenzaprine  (FLEXERIL ) 10 MG tablet Take 10 mg by mouth 2 (two) times daily as needed for muscle spasms.     DULoxetine  (CYMBALTA ) 60 MG capsule Take 60 mg by mouth daily.     ezetimibe  (ZETIA ) 10 MG tablet Take 1 tablet (10 mg total) by mouth daily. 30 tablet 1   fexofenadine (ALLEGRA) 180 MG tablet Take 180 mg by mouth daily.     furosemide  (LASIX ) 40 MG tablet TAKE 1 AND 1/2 TABLETS(60 MG) BY MOUTH DAILY 135 tablet 2   gabapentin  (NEURONTIN ) 300 MG capsule Take 1 capsule (300 mg total) by mouth 3 (three) times daily. 90 capsule 3   glimepiride  (AMARYL ) 4 MG tablet Take 4 mg by mouth daily with breakfast.     hydrOXYzine  (ATARAX ) 25 MG tablet Take 1 tablet (25 mg total) by mouth daily. 30 tablet 0   insulin  glargine (LANTUS ) 100 UNIT/ML injection Inject 1 Units into the skin at bedtime.     isosorbide  mononitrate (IMDUR ) 30 MG 24 hr tablet Take 1 tablet (30 mg total) by mouth daily. 30 tablet 2   losartan  (COZAAR ) 100 MG tablet Take 100 mg by mouth daily.     metFORMIN  (GLUCOPHAGE -XR) 500 MG 24 hr tablet Take 1,000 mg by mouth 2 (two) times daily with a meal.     metoprolol  tartrate (LOPRESSOR ) 25 MG tablet Take 1 tablet (25 mg total) by mouth 2 (two) times daily. 60 tablet 1   nitroGLYCERIN  (NITROSTAT ) 0.4 MG SL tablet Place 1 tablet (0.4 mg total) under the tongue every 5 (five) minutes x 3 doses as needed for chest pain (if no relief after 3rd dose, proceed to ED or call 911). 75 tablet 1   ondansetron  (ZOFRAN -ODT) 4 MG disintegrating tablet Take 1 tablet (4 mg total) by mouth every 8 (eight) hours as needed for vomiting or nausea. For 7 days supply 20 tablet 2   potassium chloride  SA  (KLOR-CON  M) 20 MEQ tablet Take 2 tablets (40 mEq total) by mouth daily. Please resume on 10/31/2023 180 tablet 0   pregabalin  (LYRICA ) 50 MG capsule Take 1 capsule (50 mg total) by mouth 3 (three) times daily as needed. 90 capsule 5   rosuvastatin  (CRESTOR ) 20 MG tablet Take 1 tablet (20 mg total) by mouth daily. 90 tablet 3   vitamin B-12 (CYANOCOBALAMIN ) 1000 MCG tablet Take 1,000 mcg by mouth daily.     No current facility-administered medications for this visit.    ALLERGIES:  Allergies  Allergen Reactions   Latex Rash    LABORATORY DATA:  I have reviewed the labs as listed.     Latest Ref Rng & Units 02/23/2024   12:52 PM 01/09/2024   10:36 AM 01/09/2024    9:57 AM  CBC  WBC 4.0 - 10.5 K/uL 7.7  6.7  6.7  Hemoglobin 12.0 - 15.0 g/dL 85.9  86.4  86.4   Hematocrit 36.0 - 46.0 % 41.1  39.5  39.6   Platelets 150 - 400 K/uL 255  251  248       Latest Ref Rng & Units 02/23/2024   12:52 PM 01/09/2024   10:33 AM 01/09/2024    9:57 AM  CMP  Glucose 70 - 99 mg/dL 624  783  783   BUN 8 - 23 mg/dL 39  33  34   Creatinine 0.44 - 1.00 mg/dL 8.03  8.08  8.11   Sodium 135 - 145 mmol/L 129  132  131   Potassium 3.5 - 5.1 mmol/L 4.1  3.3  3.3   Chloride 98 - 111 mmol/L 92  95  93   CO2 22 - 32 mmol/L 23  26  24    Calcium  8.9 - 10.3 mg/dL 88.2  88.8  88.9   Total Protein 6.5 - 8.1 g/dL 7.7     Total Bilirubin 0.0 - 1.2 mg/dL 0.4     Alkaline Phos 38 - 126 U/L 67     AST 15 - 41 U/L 18     ALT 0 - 44 U/L 19       DIAGNOSTIC IMAGING:  I have independently reviewed the scans and discussed with the patient. No results found.   WRAP UP:  All questions were answered. The patient knows to call the Katelyn Kaufman with any problems, questions or concerns.  Katelyn decision making: ***  Time spent on visit: I spent {CHL ONC TIME VISIT - DTPQU:[id number]} counseling the patient face to face. The total time spent in the appointment was {CHL ONC TIME VISIT - DTPQU:[id number]} and more than 50% was on  counseling.  Pleasant CHRISTELLA Barefoot, PA-C  ***

## 2024-02-28 NOTE — Progress Notes (Signed)
 Assessment/Plan:   Assessment & Plan Chronic cough with sputum production  Viral URI Patient has symptoms of acute bronchitis. No fevers or dyspnea noted. There is mild tachycardia on exam likely due to poor oral intake and lack of appetite. Will treat with decongestant. Advised importance of adequate hydration.  - Prescribe robitussin  - Advise use of warm tea with honey for cough relief. - Continue with inhalers as prescribed.  - Collect RPP - Close follow up 2 weeks  Nausea and Vomiting  Vomiting 2-3 times daily with decreased appetite, likely due to thick sputum and sinus drainage. No blood in vomit.  - Advise dietary modifications: avoid heavy foods, consume soups and crackers. - Continue Zofran  as needed  - Encourage hydration with at least eight cups of water  daily.    Return in about 2 weeks (around 03/13/2024) for URI.   Subjective:   Chief Complaint: Cough (Vomiting, loss of appetite, abdominal pain, loss of taste x 2-3 weeks)    History of Present Illness Katelyn Kaufman is a 73 year old female who presents with persistent vomiting and loss of appetite. She has a history of CAD, hyperlipidemia, hypertension, type 2 diabetes, depression, history of CABG x 3, CKD.   For the past two to three weeks, she has experienced persistent coughing, vomiting and loss of appetite, vomiting two to three times daily. She has difficulty eating, even with foods like bacon, and has been trying to consume juice instead. No recent travel or exposure to sick individuals. No blood in vomit and only a little diarrhea.  She reports thick phlegm contributing to her nausea and vomiting, which she describes as worsening. She has not been able to take over-the-counter medications. She uses an inhaler prescribed previously but finds it ineffective against the thickness of the phlegm.  She experiences sinus drainage and occasional wheezing, particularly in the mornings. No fever, shortness of  breath, or recent cold symptoms. She has a history of nausea for which she takes medication, though she cannot recall the name.  Her fluid intake includes about 24 ounces of water  daily, along with orange and grape juice.     ROS Negative unless notated in HPI  Objective:  Physical Exam Constitutional:      Comments: Appears tired  HENT:     Right Ear: Tympanic membrane normal.     Left Ear: Tympanic membrane normal.     Mouth/Throat:     Mouth: Mucous membranes are moist.     Pharynx: Oropharynx is clear. No oropharyngeal exudate or posterior oropharyngeal erythema.  Eyes:     Extraocular Movements: Extraocular movements intact.     Conjunctiva/sclera: Conjunctivae normal.  Cardiovascular:     Rate and Rhythm: Regular rhythm. Tachycardia present.     Heart sounds: Normal heart sounds.  Pulmonary:     Effort: Pulmonary effort is normal.     Breath sounds: Normal breath sounds. No wheezing, rhonchi or rales.  Musculoskeletal:        General: No swelling or tenderness.  Neurological:     Mental Status: She is alert.     Visit Vitals BP 122/78  Pulse 112  Temp 36.1 C (96.9 F) (Temporal)  Resp 18  Ht 162.6 cm (5' 4)  Wt 90.1 kg (198 lb 11.2 oz)  SpO2 98%  BMI 34.11 kg/m   BP Readings from Last 3 Encounters:  02/28/24 122/78  02/03/24 138/92  01/06/24 114/64   Wt Readings from Last 3 Encounters:  02/28/24 90.1 kg (  198 lb 11.2 oz)  02/09/24 92.5 kg (204 lb)  02/03/24 92.9 kg (204 lb 14.4 oz)                 This note was created using Dragon/Abdridge dictation software and while every attempt has been made to minimize errors, some may still be  present.

## 2024-02-29 ENCOUNTER — Inpatient Hospital Stay: Payer: Medicare (Managed Care) | Admitting: Physician Assistant

## 2024-02-29 ENCOUNTER — Inpatient Hospital Stay: Payer: Medicare (Managed Care)

## 2024-02-29 ENCOUNTER — Encounter: Payer: Self-pay | Admitting: Physician Assistant

## 2024-02-29 VITALS — BP 120/77 | HR 99 | Temp 97.8°F | Resp 20 | Wt 196.4 lb

## 2024-02-29 DIAGNOSIS — C50412 Malignant neoplasm of upper-outer quadrant of left female breast: Secondary | ICD-10-CM | POA: Diagnosis not present

## 2024-02-29 DIAGNOSIS — Z1231 Encounter for screening mammogram for malignant neoplasm of breast: Secondary | ICD-10-CM | POA: Diagnosis not present

## 2024-02-29 DIAGNOSIS — Z170 Estrogen receptor positive status [ER+]: Secondary | ICD-10-CM

## 2024-02-29 DIAGNOSIS — E1100 Type 2 diabetes mellitus with hyperosmolarity without nonketotic hyperglycemic-hyperosmolar coma (NKHHC): Secondary | ICD-10-CM | POA: Diagnosis not present

## 2024-02-29 DIAGNOSIS — M8580 Other specified disorders of bone density and structure, unspecified site: Secondary | ICD-10-CM

## 2024-02-29 DIAGNOSIS — E860 Dehydration: Secondary | ICD-10-CM | POA: Diagnosis not present

## 2024-02-29 NOTE — Patient Instructions (Signed)
 [facility] at [facility] **VISIT SUMMARY & IMPORTANT INSTRUCTIONS **   You were seen today by Pleasant Barefoot PA-C for your history of breast cancer.   Your most recent labs, mammogram, and physical exam did not show any sign of recurrent breast cancer. Continue taking anastrozole  (Arimidex ) once daily for breast cancer. Take gabapentin  as prescribed by your neurologist to help with hot flashes. Continue taking vitamin D  supplement to improve your bone strength. Do NOT take any calcium , since your calcium  levels are too high. We will see you for follow-up visit in 6 months.  Your next mammogram will not be until June 2026.  OTHER TESTS: We will check bone density scan in 6 months.  It is important to make sure that your bones are not getting too fragile or weak, since this can be a side effect of your breast cancer pill.  FOLLOW-UP APPOINTMENT: 6 months  ** Thank you for trusting me with your healthcare!  I strive to provide all of my patients with quality care at each visit.  If you receive a survey for this visit, I would be so grateful to you for taking the time to provide feedback.  Thank you in advance!  ~ Tambra Muller                                        Dr. Mickiel Davonna Pleasant Barefoot, PA-C          Delon Hope, NP   - - - - - - - - - - - - - - - - - -    Thank you for choosing [facility] at [facility] to provide your oncology and hematology care.  To afford each patient quality time with our provider, please arrive at least 15 minutes before your scheduled appointment time.   If you have a lab appointment with the Cancer Center please come in thru the Main Entrance and check in at the main information desk.  You need to re-schedule your appointment should you arrive 10 or more minutes late.  We strive to give you quality time with our providers, and arriving late affects you and other patients whose appointments are  after yours.  Also, if you no show three or more times for appointments you may be dismissed from the clinic at the providers discretion.     Again, thank you for choosing [facility].  Our hope is that these requests will decrease the amount of time that you wait before being seen by our physicians.       _____________________________________________________________  Should you have questions after your visit to [facility], please contact our office at [phone] and follow the prompts.  Our office hours are 8:00 a.m. and 4:30 p.m. Monday - Friday.  Please note that voicemails left after 4:00 p.m. may not be returned until the following business day.  We are closed weekends and major holidays.  You do have access to a nurse 24-7, just call the main number to the clinic [phone] and do not press any options, hold on the line and a nurse will answer the phone.    For prescription refill requests, have your pharmacy contact our office and allow 72 hours.

## 2024-03-02 ENCOUNTER — Encounter (HOSPITAL_COMMUNITY): Payer: Self-pay

## 2024-03-02 ENCOUNTER — Inpatient Hospital Stay (HOSPITAL_COMMUNITY)
Admission: EM | Admit: 2024-03-02 | Discharge: 2024-03-09 | DRG: 637 | Disposition: A | Discharge status: Home / Self Care | Attending: Student | Admitting: Student

## 2024-03-02 ENCOUNTER — Other Ambulatory Visit: Payer: Self-pay

## 2024-03-02 ENCOUNTER — Emergency Department (HOSPITAL_COMMUNITY)

## 2024-03-02 DIAGNOSIS — I25119 Atherosclerotic heart disease of native coronary artery with unspecified angina pectoris: Secondary | ICD-10-CM | POA: Diagnosis present

## 2024-03-02 DIAGNOSIS — Z794 Long term (current) use of insulin: Secondary | ICD-10-CM

## 2024-03-02 DIAGNOSIS — E871 Hypo-osmolality and hyponatremia: Secondary | ICD-10-CM | POA: Diagnosis present

## 2024-03-02 DIAGNOSIS — I130 Hypertensive heart and chronic kidney disease with heart failure and stage 1 through stage 4 chronic kidney disease, or unspecified chronic kidney disease: Secondary | ICD-10-CM | POA: Diagnosis present

## 2024-03-02 DIAGNOSIS — I420 Dilated cardiomyopathy: Secondary | ICD-10-CM | POA: Diagnosis present

## 2024-03-02 DIAGNOSIS — I25810 Atherosclerosis of coronary artery bypass graft(s) without angina pectoris: Secondary | ICD-10-CM | POA: Diagnosis present

## 2024-03-02 DIAGNOSIS — Z825 Family history of asthma and other chronic lower respiratory diseases: Secondary | ICD-10-CM

## 2024-03-02 DIAGNOSIS — I429 Cardiomyopathy, unspecified: Secondary | ICD-10-CM

## 2024-03-02 DIAGNOSIS — Z1152 Encounter for screening for COVID-19: Secondary | ICD-10-CM

## 2024-03-02 DIAGNOSIS — R079 Chest pain, unspecified: Secondary | ICD-10-CM | POA: Diagnosis not present

## 2024-03-02 DIAGNOSIS — E872 Acidosis, unspecified: Secondary | ICD-10-CM | POA: Diagnosis present

## 2024-03-02 DIAGNOSIS — J449 Chronic obstructive pulmonary disease, unspecified: Secondary | ICD-10-CM | POA: Diagnosis present

## 2024-03-02 DIAGNOSIS — N179 Acute kidney failure, unspecified: Secondary | ICD-10-CM | POA: Diagnosis present

## 2024-03-02 DIAGNOSIS — I25118 Atherosclerotic heart disease of native coronary artery with other forms of angina pectoris: Secondary | ICD-10-CM | POA: Diagnosis present

## 2024-03-02 DIAGNOSIS — I5041 Acute combined systolic (congestive) and diastolic (congestive) heart failure: Secondary | ICD-10-CM | POA: Diagnosis present

## 2024-03-02 DIAGNOSIS — E1100 Type 2 diabetes mellitus with hyperosmolarity without nonketotic hyperglycemic-hyperosmolar coma (NKHHC): Principal | ICD-10-CM | POA: Diagnosis present

## 2024-03-02 DIAGNOSIS — Z7982 Long term (current) use of aspirin: Secondary | ICD-10-CM

## 2024-03-02 DIAGNOSIS — N1832 Chronic kidney disease, stage 3b: Secondary | ICD-10-CM | POA: Diagnosis present

## 2024-03-02 DIAGNOSIS — F419 Anxiety disorder, unspecified: Secondary | ICD-10-CM | POA: Diagnosis present

## 2024-03-02 DIAGNOSIS — E860 Dehydration: Secondary | ICD-10-CM | POA: Diagnosis present

## 2024-03-02 DIAGNOSIS — Z951 Presence of aortocoronary bypass graft: Secondary | ICD-10-CM | POA: Diagnosis not present

## 2024-03-02 DIAGNOSIS — E782 Mixed hyperlipidemia: Secondary | ICD-10-CM | POA: Diagnosis not present

## 2024-03-02 DIAGNOSIS — I2510 Atherosclerotic heart disease of native coronary artery without angina pectoris: Secondary | ICD-10-CM | POA: Diagnosis not present

## 2024-03-02 DIAGNOSIS — I428 Other cardiomyopathies: Secondary | ICD-10-CM | POA: Diagnosis not present

## 2024-03-02 DIAGNOSIS — I5181 Takotsubo syndrome: Secondary | ICD-10-CM | POA: Diagnosis present

## 2024-03-02 DIAGNOSIS — Z853 Personal history of malignant neoplasm of breast: Secondary | ICD-10-CM

## 2024-03-02 DIAGNOSIS — E785 Hyperlipidemia, unspecified: Secondary | ICD-10-CM | POA: Diagnosis present

## 2024-03-02 DIAGNOSIS — Z79811 Long term (current) use of aromatase inhibitors: Secondary | ICD-10-CM

## 2024-03-02 DIAGNOSIS — E118 Type 2 diabetes mellitus with unspecified complications: Secondary | ICD-10-CM

## 2024-03-02 DIAGNOSIS — I5021 Acute systolic (congestive) heart failure: Secondary | ICD-10-CM | POA: Diagnosis not present

## 2024-03-02 DIAGNOSIS — Z9012 Acquired absence of left breast and nipple: Secondary | ICD-10-CM

## 2024-03-02 DIAGNOSIS — E66812 Obesity, class 2: Secondary | ICD-10-CM | POA: Diagnosis not present

## 2024-03-02 DIAGNOSIS — Z23 Encounter for immunization: Secondary | ICD-10-CM | POA: Diagnosis present

## 2024-03-02 DIAGNOSIS — F32A Depression, unspecified: Secondary | ICD-10-CM | POA: Diagnosis present

## 2024-03-02 DIAGNOSIS — I5032 Chronic diastolic (congestive) heart failure: Secondary | ICD-10-CM | POA: Insufficient documentation

## 2024-03-02 DIAGNOSIS — G894 Chronic pain syndrome: Secondary | ICD-10-CM | POA: Diagnosis present

## 2024-03-02 DIAGNOSIS — I252 Old myocardial infarction: Secondary | ICD-10-CM

## 2024-03-02 DIAGNOSIS — Z79899 Other long term (current) drug therapy: Secondary | ICD-10-CM

## 2024-03-02 DIAGNOSIS — E1169 Type 2 diabetes mellitus with other specified complication: Secondary | ICD-10-CM | POA: Diagnosis present

## 2024-03-02 DIAGNOSIS — Z96651 Presence of right artificial knee joint: Secondary | ICD-10-CM | POA: Diagnosis present

## 2024-03-02 DIAGNOSIS — E1122 Type 2 diabetes mellitus with diabetic chronic kidney disease: Secondary | ICD-10-CM | POA: Diagnosis present

## 2024-03-02 DIAGNOSIS — Z6835 Body mass index (BMI) 35.0-35.9, adult: Secondary | ICD-10-CM

## 2024-03-02 DIAGNOSIS — F17210 Nicotine dependence, cigarettes, uncomplicated: Secondary | ICD-10-CM | POA: Diagnosis present

## 2024-03-02 DIAGNOSIS — Z7984 Long term (current) use of oral hypoglycemic drugs: Secondary | ICD-10-CM

## 2024-03-02 DIAGNOSIS — Z8249 Family history of ischemic heart disease and other diseases of the circulatory system: Secondary | ICD-10-CM

## 2024-03-02 DIAGNOSIS — Z91040 Latex allergy status: Secondary | ICD-10-CM

## 2024-03-02 DIAGNOSIS — I10 Essential (primary) hypertension: Secondary | ICD-10-CM | POA: Diagnosis not present

## 2024-03-02 DIAGNOSIS — R0789 Other chest pain: Secondary | ICD-10-CM | POA: Diagnosis not present

## 2024-03-02 DIAGNOSIS — Z811 Family history of alcohol abuse and dependence: Secondary | ICD-10-CM

## 2024-03-02 LAB — CBC WITH DIFFERENTIAL/PLATELET
Abs Immature Granulocytes: 0.05 K/uL (ref 0.00–0.07)
Basophils Absolute: 0 K/uL (ref 0.0–0.1)
Basophils Relative: 0 %
Eosinophils Absolute: 0 K/uL (ref 0.0–0.5)
Eosinophils Relative: 0 %
HCT: 43 % (ref 36.0–46.0)
Hemoglobin: 15.2 g/dL — ABNORMAL HIGH (ref 12.0–15.0)
Immature Granulocytes: 0 %
Lymphocytes Relative: 18 %
Lymphs Abs: 2.1 K/uL (ref 0.7–4.0)
MCH: 32.3 pg (ref 26.0–34.0)
MCHC: 35.3 g/dL (ref 30.0–36.0)
MCV: 91.3 fL (ref 80.0–100.0)
Monocytes Absolute: 1 K/uL (ref 0.1–1.0)
Monocytes Relative: 8 %
Neutro Abs: 8.7 K/uL — ABNORMAL HIGH (ref 1.7–7.7)
Neutrophils Relative %: 74 %
Platelets: 257 K/uL (ref 150–400)
RBC: 4.71 MIL/uL (ref 3.87–5.11)
RDW: 13.2 % (ref 11.5–15.5)
WBC: 11.8 K/uL — ABNORMAL HIGH (ref 4.0–10.5)
nRBC: 0 % (ref 0.0–0.2)

## 2024-03-02 LAB — COMPREHENSIVE METABOLIC PANEL WITH GFR
ALT: 22 U/L (ref 0–44)
AST: 26 U/L (ref 15–41)
Albumin: 4.1 g/dL (ref 3.5–5.0)
Alkaline Phosphatase: 84 U/L (ref 38–126)
Anion gap: 15 (ref 5–15)
BUN: 47 mg/dL — ABNORMAL HIGH (ref 8–23)
CO2: 20 mmol/L — ABNORMAL LOW (ref 22–32)
Calcium: 11.6 mg/dL — ABNORMAL HIGH (ref 8.9–10.3)
Chloride: 80 mmol/L — ABNORMAL LOW (ref 98–111)
Creatinine, Ser: 3.16 mg/dL — ABNORMAL HIGH (ref 0.44–1.00)
GFR, Estimated: 15 mL/min — ABNORMAL LOW (ref >60)
Glucose, Bld: 653 mg/dL (ref 70–99)
Potassium: 4.4 mmol/L (ref 3.5–5.1)
Sodium: 116 mmol/L — CL (ref 135–145)
Total Bilirubin: 0.8 mg/dL (ref 0.0–1.2)
Total Protein: 7.3 g/dL (ref 6.5–8.1)

## 2024-03-02 LAB — URINALYSIS, W/ REFLEX TO CULTURE (INFECTION SUSPECTED)
Bacteria, UA: NONE SEEN
Bilirubin Urine: NEGATIVE
Glucose, UA: >=500 mg/dL — AB
Hgb urine dipstick: NEGATIVE
Ketones, ur: NEGATIVE mg/dL
Nitrite: NEGATIVE
Protein, ur: NEGATIVE mg/dL
Specific Gravity, Urine: 1.015 (ref 1.005–1.030)
pH: 5 (ref 5.0–8.0)

## 2024-03-02 LAB — BASIC METABOLIC PANEL WITH GFR
Anion gap: 16 — ABNORMAL HIGH (ref 5–15)
BUN: 44 mg/dL — ABNORMAL HIGH (ref 8–23)
CO2: 24 mmol/L (ref 22–32)
Calcium: 11.2 mg/dL — ABNORMAL HIGH (ref 8.9–10.3)
Chloride: 91 mmol/L — ABNORMAL LOW (ref 98–111)
Creatinine, Ser: 2.78 mg/dL — ABNORMAL HIGH (ref 0.44–1.00)
GFR, Estimated: 17 mL/min — ABNORMAL LOW (ref >60)
Glucose, Bld: 197 mg/dL — ABNORMAL HIGH (ref 70–99)
Potassium: 3.4 mmol/L — ABNORMAL LOW (ref 3.5–5.1)
Sodium: 131 mmol/L — ABNORMAL LOW (ref 135–145)

## 2024-03-02 LAB — MRSA NEXT GEN BY PCR, NASAL: MRSA by PCR Next Gen: NOT DETECTED

## 2024-03-02 LAB — MAGNESIUM: Magnesium: 2.6 mg/dL — ABNORMAL HIGH (ref 1.7–2.4)

## 2024-03-02 LAB — GLUCOSE, CAPILLARY
Glucose-Capillary: 192 mg/dL — ABNORMAL HIGH (ref 70–99)
Glucose-Capillary: 256 mg/dL — ABNORMAL HIGH (ref 70–99)
Glucose-Capillary: 336 mg/dL — ABNORMAL HIGH (ref 70–99)

## 2024-03-02 LAB — LACTIC ACID, PLASMA: Lactic Acid, Venous: 3 mmol/L (ref 0.5–1.9)

## 2024-03-02 LAB — RESP PANEL BY RT-PCR (RSV, FLU A&B, COVID)  RVPGX2
Influenza A by PCR: NEGATIVE
Influenza B by PCR: NEGATIVE
Resp Syncytial Virus by PCR: NEGATIVE
SARS Coronavirus 2 by RT PCR: NEGATIVE

## 2024-03-02 LAB — LIPASE, BLOOD: Lipase: 22 U/L (ref 11–51)

## 2024-03-02 MED ORDER — GUAIFENESIN ER 600 MG PO TB12
1200.0000 mg | ORAL_TABLET | Freq: Two times a day (BID) | ORAL | Status: DC
  Administered 2024-03-02 – 2024-03-09 (×14): 1200 mg via ORAL
  Filled 2024-03-02 (×14): qty 2

## 2024-03-02 MED ORDER — POTASSIUM CHLORIDE 10 MEQ/100ML IV SOLN
10.0000 meq | INTRAVENOUS | Status: AC
  Administered 2024-03-02 (×2): 10 meq via INTRAVENOUS
  Filled 2024-03-02 (×2): qty 100

## 2024-03-02 MED ORDER — SODIUM CHLORIDE 0.9 % IV BOLUS
1000.0000 mL | Freq: Once | INTRAVENOUS | Status: AC
  Administered 2024-03-03: 1000 mL via INTRAVENOUS

## 2024-03-02 MED ORDER — UMECLIDINIUM BROMIDE 62.5 MCG/ACT IN AEPB
1.0000 | INHALATION_SPRAY | Freq: Every day | RESPIRATORY_TRACT | Status: DC
  Administered 2024-03-03 – 2024-03-09 (×6): 1 via RESPIRATORY_TRACT
  Filled 2024-03-02 (×3): qty 7

## 2024-03-02 MED ORDER — DEXTROSE IN LACTATED RINGERS 5 % IV SOLN: INTRAVENOUS | Status: DC

## 2024-03-02 MED ORDER — INFLUENZA VAC SPLIT HIGH-DOSE 0.5 ML IM SUSY
0.5000 mL | PREFILLED_SYRINGE | INTRAMUSCULAR | Status: AC | PRN
Start: 2024-03-02 — End: ?
  Administered 2024-03-09: 0.5 mL via INTRAMUSCULAR
  Filled 2024-03-02: qty 0.5

## 2024-03-02 MED ORDER — ORAL CARE MOUTH RINSE: 15.0000 mL | OROMUCOSAL | Status: DC | PRN

## 2024-03-02 MED ORDER — LACTATED RINGERS IV SOLN: INTRAVENOUS | Status: DC

## 2024-03-02 MED ORDER — INSULIN REGULAR(HUMAN) IN NACL 100-0.9 UT/100ML-% IV SOLN
INTRAVENOUS | Status: AC
  Administered 2024-03-02: 11.5 [IU]/h via INTRAVENOUS
  Filled 2024-03-02: qty 100

## 2024-03-02 MED ORDER — ANASTROZOLE 1 MG PO TABS
1.0000 mg | ORAL_TABLET | Freq: Every day | ORAL | Status: AC
Start: 2024-03-03 — End: ?
  Administered 2024-03-03 – 2024-03-09 (×7): 1 mg via ORAL
  Filled 2024-03-02 (×8): qty 1

## 2024-03-02 MED ORDER — TIOTROPIUM BROMIDE MONOHYDRATE 18 MCG IN CAPS: 1.0000 | ORAL_CAPSULE | Freq: Every day | RESPIRATORY_TRACT | Status: DC

## 2024-03-02 MED ORDER — HEPARIN SODIUM (PORCINE) 5000 UNIT/ML IJ SOLN
5000.0000 [IU] | Freq: Three times a day (TID) | INTRAMUSCULAR | Status: DC
  Administered 2024-03-03 – 2024-03-06 (×10): 5000 [IU] via SUBCUTANEOUS
  Filled 2024-03-02 (×10): qty 1

## 2024-03-02 MED ORDER — SODIUM CHLORIDE 0.9 % IV SOLN
100.0000 mg | Freq: Two times a day (BID) | INTRAVENOUS | Status: DC
  Administered 2024-03-02 – 2024-03-03 (×3): 100 mg via INTRAVENOUS
  Filled 2024-03-02 (×7): qty 100

## 2024-03-02 MED ORDER — SODIUM CHLORIDE 0.9 % IV SOLN: 500.0000 mg | INTRAVENOUS | Status: DC

## 2024-03-02 MED ORDER — POTASSIUM CHLORIDE 10 MEQ/100ML IV SOLN: 10.0000 meq | INTRAVENOUS | Status: DC

## 2024-03-02 MED ORDER — SODIUM CHLORIDE 0.9 % IV BOLUS
30.0000 mL/kg | Freq: Once | INTRAVENOUS | Status: AC
  Administered 2024-03-02: 2055 mL via INTRAVENOUS

## 2024-03-02 MED ORDER — BUPRENORPHINE HCL 2 MG SL SUBL: 2.0000 mg | SUBLINGUAL_TABLET | Freq: Three times a day (TID) | SUBLINGUAL | Status: DC

## 2024-03-02 MED ORDER — DEXTROSE 50 % IV SOLN: 0.0000 mL | INTRAVENOUS | Status: DC | PRN

## 2024-03-02 MED ORDER — PANTOPRAZOLE SODIUM 40 MG IV SOLR
40.0000 mg | INTRAVENOUS | Status: DC
  Administered 2024-03-02 – 2024-03-03 (×2): 40 mg via INTRAVENOUS
  Filled 2024-03-02 (×2): qty 10

## 2024-03-02 MED ORDER — INSULIN ASPART 100 UNIT/ML IJ SOLN
10.0000 [IU] | Freq: Once | INTRAMUSCULAR | Status: AC
  Administered 2024-03-02: 10 [IU] via INTRAVENOUS
  Filled 2024-03-02: qty 1

## 2024-03-02 MED ORDER — LACTATED RINGERS IV BOLUS
20.0000 mL/kg | Freq: Once | INTRAVENOUS | Status: AC
  Administered 2024-03-02: 1782 mL via INTRAVENOUS

## 2024-03-02 NOTE — H&P (Signed)
 [facility] H&P   Patient Demographics:    Katelyn Kaufman, is a 73 y.o. female  MRN: [id number]   DOB - [birth year]  Admit Date - 03/02/2024  Outpatient Primary MD for the patient is [facility]  Referring MD/NP/PA: Dr Francesca  Patient coming from: home  No chief complaint on file.     HPI:    Katelyn Kaufman  is a 73 y.o. female,  with medical history significant of anxiety, coronary artery disease, COPD, depression, hypertension, hyperlipidemia, OSA, type 2 diabetes mellitus. - Presents to ED secondary to complaints of nausea, vomiting, abdominal pain, cough and congestion, reports upper respiratory symptoms going on for last 2 weeks, denies any sick contact, reports subjective fever and chills at home, she reports abdominal pain, nausea and vomiting as well, has been going on for last 5 days, patient reports she is not on any insulin  anymore, not sure if she is in any oral hypoglycemic agents or not. -NAD CT abdomen pelvis with no acute findings, chest x-ray with no acute findings, COVID, RSV and influenza are negative, workup significant for low sodium at 116, but once corrected to per glycemia of 653 it is 125, creatinine up from 1.9 last week to 3.16, white blood cell count elevated at 11.8, lactic acid elevated at 3, UA significant for glucose urea but no ketonuria,.  [facility] consulted to admit  Review of systems:      A full 10 point Review of Systems was done, except as stated above, all other Review of Systems were negative.   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   COPD (chronic obstructive pulmonary  disease) (HCC)    Depression    Essential hypertension    Hyperlipidemia    Myocardial infarction (HCC)    [place] to the LAD   OSA (obstructive sleep apnea)    CPAP qHS   PONV (postoperative nausea and vomiting)    Type 2 diabetes mellitus (HCC)       Past Surgical History:  Procedure Laterality Date   BACK SURGERY     BREAST SURGERY     CATARACT EXTRACTION W/PHACO Right 10/24/2020   Procedure: CATARACT EXTRACTION PHACO AND INTRAOCULAR LENS PLACEMENT (IOC);  Surgeon: Harrie Agent, MD;  Location: [facility];  Service: Ophthalmology;  Laterality: Right;  CDE: 10.87   CATARACT EXTRACTION W/PHACO Left 12/01/2020   Procedure: CATARACT EXTRACTION PHACO AND INTRAOCULAR LENS PLACEMENT LEFT EYE;  Surgeon: Harrie Agent, MD;  Location: [facility];  Service: Ophthalmology;  Laterality: Left;  CDE  8.62   CORONARY ARTERY BYPASS GRAFT N/A 11/16/2021   Procedure: CORONARY ARTERY BYPASS GRAFTING (CABG) times three using the left internal mammary and right saphenous vein.;  Surgeon: Lucas Dorise POUR, MD;  Location: [facility] OR;  Service: Open Heart Surgery;  Laterality: N/A;   IABP INSERTION N/A 11/16/2021   Procedure: IABP Insertion;  Surgeon: Anner Alm ORN, MD;  Location: [facility] INVASIVE CV LAB;  Service: Cardiovascular;  Laterality: N/A;   LEFT HEART CATH AND CORONARY ANGIOGRAPHY N/A 11/16/2021   Procedure: LEFT HEART CATH AND CORONARY ANGIOGRAPHY;  Surgeon: Anner Alm ORN, MD;  Location: [facility] INVASIVE CV LAB;  Service: Cardiovascular;  Laterality: N/A;   REPLACEMENT TOTAL KNEE Right    SIMPLE MASTECTOMY WITH AXILLARY SENTINEL NODE BIOPSY Left 06/25/2022   Procedure: SIMPLE MASTECTOMY;  Surgeon: Mavis Anes, MD;  Location: [facility];  Service: General;  Laterality: Left;   TEE WITHOUT CARDIOVERSION N/A 11/16/2021   Procedure: TRANSESOPHAGEAL ECHOCARDIOGRAM (TEE);  Surgeon: Lucas Dorise POUR, MD;  Location: [facility] OR;  Service: Open Heart Surgery;   Laterality: N/A;   TUBAL LIGATION     WOUND EXPLORATION Left 07/26/2022   Procedure: EVACUATION OF HEMATOMA BREAST, DEBRIDEMENT OF LEFT BREAST WOUND;  Surgeon: Mavis Anes, MD;  Location: [facility];  Service: General;  Laterality: Left;      Social History:     Social History   Tobacco Use   Smoking status: Every Day    Current packs/day: 0.50    Average packs/day: 0.5 packs/day for 40.0 years (20.0 ttl pk-yrs)    Types: Cigarettes   Smokeless tobacco: Never  Substance Use Topics   Alcohol use: Never       Family History :     Family History  Problem Relation Age of Onset   COPD Mother    Alcohol abuse Father    Hypertension Sister    Heart attack Brother       Home Medications:   Prior to Admission medications   Medication Sig Start Date End Date Taking? Authorizing Provider  Accu-Chek Softclix Lancets lancets USE TO TEST BLOOD SUGAR UP TO FOUR TIMES DAILY(IN THE MORNING BEFORE BREAKFAST AND 2 HOURS AFTER EACH MEAL). 12/23/23   [provider]  acetaminophen  (TYLENOL ) 325 MG tablet Take 2 tablets (650 mg total) by mouth every 6 (six) hours as needed for mild pain (or Fever >/= 101). 07/30/22   Pearlean Manus, MD  ALPRAZolam  (XANAX ) 0.5 MG tablet Take 1-2 tablets 30 minutes prior to MRI, may repeat once as needed. Must have driver. Patient not taking: Reported on 02/29/2024 11/22/23   Onita Duos, MD  amLODipine  (NORVASC ) 5 MG tablet Take 1 tablet (5 mg total) by mouth daily. 10/27/23   Krishnan, Gokul, MD  anastrozole  (ARIMIDEX ) 1 MG tablet TAKE 1 TABLET(1 MG) BY MOUTH DAILY 06/06/23   Rogers Hai, MD  aspirin  EC 81 MG tablet Take 1 tablet (81 mg total) by mouth daily. Swallow whole. 11/23/21   Dwan Kyla HERO, PA-C  azelastine (ASTELIN) 0.1 % nasal spray 1 spray. 01/06/24 01/05/25  [provider]  buprenorphine  (SUBUTEX ) 2 MG SUBL SL tablet Place 4 mg under the tongue every 8 (eight) hours. Patient taking differently: Place 2 mg under the tongue  every 8 (eight) hours. 03/24/23   [provider]  Buprenorphine  HCl-Naloxone HCl 2-0.5 MG FILM (Schedule III Drug) Sublingual; Duration: 30    [provider]  buPROPion (WELLBUTRIN XL) 150 MG 24 hr tablet Take 150 mg by mouth every  morning. 02/03/24 02/02/25  [provider]  celecoxib  (CELEBREX ) 50 MG capsule TAKE 1 CAPSULE(50 MG) BY MOUTH TWICE DAILY AS NEEDED FOR PAIN 01/24/24   Onita Duos, MD  cholecalciferol  (VITAMIN D3) 25 MCG (1000 UNIT) tablet Take 1,000 Units by mouth daily.    [provider]  Dextromethorphan-guaiFENesin (ROBITUSSIN COUGH+CHEST CONG DM) 5-100 MG/5ML LIQD Take 10 mLs by mouth. 02/28/24   [provider]  DULoxetine  (CYMBALTA ) 60 MG capsule Take 60 mg by mouth daily.    [provider]  ezetimibe  (ZETIA ) 10 MG tablet Take 1 tablet (10 mg total) by mouth daily. 10/27/23   Krishnan, Gokul, MD  fexofenadine (ALLEGRA) 180 MG tablet Take 180 mg by mouth daily.    [provider]  furosemide  (LASIX ) 40 MG tablet TAKE 1 AND 1/2 TABLETS(60 MG) BY MOUTH DAILY 11/03/23   Debera Jayson MATSU, MD  gabapentin  (NEURONTIN ) 100 MG capsule Take 400 mg by mouth. 01/06/24   [provider]  glimepiride  (AMARYL ) 4 MG tablet Take 4 mg by mouth daily with breakfast.    [provider]  glipiZIDE (GLUCOTROL) 5 MG tablet Take 5 mg by mouth.    [provider]  hydrochlorothiazide (HYDRODIURIL) 25 MG tablet Take 25 mg by mouth. 01/24/24   [provider]  hydrOXYzine  (ATARAX ) 25 MG tablet Take 1 tablet (25 mg total) by mouth daily. 01/24/24   Lamon Pleasant HERO, PA-C  insulin  glargine (LANTUS ) 100 UNIT/ML injection Inject 1 Units into the skin at bedtime.    [provider]  isosorbide  mononitrate (IMDUR ) 30 MG 24 hr tablet Take 1 tablet (30 mg total) by mouth daily. 07/31/22   Pearlean Manus, MD  losartan  (COZAAR ) 100 MG tablet Take 100 mg by mouth daily. 11/03/23   [provider]   montelukast (SINGULAIR) 10 MG tablet Take 10 mg by mouth. 02/03/24 02/02/25  [provider]  Multiple Vitamin (MULTI-VITAMIN) tablet Take 1 tablet by mouth daily.    [provider]  nitroGLYCERIN  (NITROSTAT ) 0.4 MG SL tablet Place 1 tablet (0.4 mg total) under the tongue every 5 (five) minutes x 3 doses as needed for chest pain (if no relief after 3rd dose, proceed to ED or call 911). 07/08/22   Debera Jayson MATSU, MD  ondansetron  (ZOFRAN -ODT) 4 MG disintegrating tablet Take 1 tablet (4 mg total) by mouth every 8 (eight) hours as needed for vomiting or nausea. For 7 days supply 09/15/23   Katragadda, Sreedhar, MD  pioglitazone (ACTOS) 45 MG tablet Take 45 mg by mouth. 01/06/24 04/05/24  [provider]  potassium chloride  SA (KLOR-CON  M) 20 MEQ tablet Take 2 tablets (40 mEq total) by mouth daily. Please resume on 10/31/2023 02/15/24   Miriam Norris, NP  pregabalin  (LYRICA ) 50 MG capsule Take 1 capsule (50 mg total) by mouth 3 (three) times daily as needed. 05/31/23   Onita Duos, MD  rosuvastatin  (CRESTOR ) 20 MG tablet Take 1 tablet (20 mg total) by mouth daily. 10/04/23   Debera Jayson MATSU, MD  tiotropium (SPIRIVA ) 18 MCG inhalation capsule Place 1 capsule into inhaler and inhale daily. 02/03/24 02/02/25  [provider]  vitamin B-12 (CYANOCOBALAMIN ) 1000 MCG tablet Take 1,000 mcg by mouth daily.    [provider]     Allergies:     Allergies  Allergen Reactions   Latex Rash and Dermatitis     Physical Exam:   Vitals  Blood pressure (!) 136/91, pulse 98, temperature 98.6 F (37 C), temperature source Oral,  resp. rate 20, height 5' 4 (1.626 m), weight 89.1 kg, SpO2 95%.   1. General Appearing female, with cough, congestion  2. Normal affect and insight, Not Suicidal or Homicidal, Awake Alert, Oriented X 3.  3. No F.N deficits, ALL C.Nerves Intact, Strength 5/5 all 4 extremities, Sensation intact all 4 extremities, Plantars down going.  4.  Ears and Eyes appear Normal, Conjunctivae clear, PERRLA. Moist Oral Mucosa.  5. Supple Neck, No JVD, No cervical lymphadenopathy appriciated, No Carotid Bruits.  6. Symmetrical Chest wall movement, Good air movement bilaterally, With scattered rales and rhonchi  7.  Cardiac, No Gallops, Rubs or Murmurs, No Parasternal Heave.  8. Positive Bowel Sounds, Abdomen Soft, No tenderness, No organomegaly appriciated,No rebound -guarding or rigidity.  9.  No Cyanosis, delayed skin Turgor, No Skin Rash or Bruise.  10. Good muscle tone,  joints appear normal , no effusions, Normal ROM.     Data Review:    CBC Recent Labs  Lab 03/02/24 1830  WBC 11.8*  HGB 15.2*  HCT 43.0  PLT 257  MCV 91.3  MCH 32.3  MCHC 35.3  RDW 13.2  LYMPHSABS 2.1  MONOABS 1.0  EOSABS 0.0  BASOSABS 0.0   ------------------------------------------------------------------------------------------------------------------  Chemistries  Recent Labs  Lab 03/02/24 1830  NA 116*  K 4.4  CL 80*  CO2 20*  GLUCOSE 653*  BUN 47*  CREATININE 3.16*  CALCIUM  11.6*  MG 2.6*  AST 26  ALT 22  ALKPHOS 84  BILITOT 0.8   ------------------------------------------------------------------------------------------------------------------ estimated creatinine clearance is 17.1 mL/min (A) (by C-G formula based on SCr of 3.16 mg/dL (H)). ------------------------------------------------------------------------------------------------------------------ No results for input(s): TSH, T4TOTAL, T3FREE, THYROIDAB in the last 72 hours.  Invalid input(s): FREET3  Coagulation profile No results for input(s): INR, PROTIME in the last 168 hours. ------------------------------------------------------------------------------------------------------------------- No results for input(s): DDIMER in the last 72  hours. -------------------------------------------------------------------------------------------------------------------  Cardiac Enzymes No results for input(s): CKMB, TROPONINI, MYOGLOBIN in the last 168 hours.  Invalid input(s): CK ------------------------------------------------------------------------------------------------------------------    Component Value Date/Time   BNP 86.0 07/28/2021 1420     ---------------------------------------------------------------------------------------------------------------  Urinalysis    Component Value Date/Time   COLORURINE YELLOW 03/02/2024 2000   APPEARANCEUR HAZY (A) 03/02/2024 2000   LABSPEC 1.015 03/02/2024 2000   PHURINE 5.0 03/02/2024 2000   GLUCOSEU >=500 (A) 03/02/2024 2000   HGBUR NEGATIVE 03/02/2024 2000   BILIRUBINUR NEGATIVE 03/02/2024 2000   KETONESUR NEGATIVE 03/02/2024 2000   PROTEINUR NEGATIVE 03/02/2024 2000   NITRITE NEGATIVE 03/02/2024 2000   LEUKOCYTESUR SMALL (A) 03/02/2024 2000    ----------------------------------------------------------------------------------------------------------------   Imaging Results:    DG Chest Portable 1 View Result Date: 03/02/2024 CLINICAL DATA:  Shortness of breath, nausea, vomiting, diarrhea, and abdominal pain. Frequent urination. EXAM: PORTABLE CHEST 1 VIEW COMPARISON:  10/24/2023 FINDINGS: Postoperative changes in the mediastinum. Normal heart size and pulmonary vascularity. No focal airspace disease or consolidation in the lungs. No blunting of costophrenic angles. No pneumothorax. Mediastinal contours appear intact. Degenerative changes in spine and shoulders. IMPRESSION: No active disease. Electronically Signed   By: Elsie Gravely M.D.   On: 03/02/2024 19:42   CT ABDOMEN PELVIS WO CONTRAST Result Date: 03/02/2024 CLINICAL DATA:  Acute abdominal pain EXAM: CT ABDOMEN AND PELVIS WITHOUT CONTRAST TECHNIQUE: Multidetector CT imaging of the abdomen and pelvis  was performed following the standard protocol without IV contrast. RADIATION DOSE REDUCTION: This exam was performed according to the departmental dose-optimization program which includes automated exposure control, adjustment of the mA and/or kV  according to patient size and/or use of iterative reconstruction technique. COMPARISON:  None Available. FINDINGS: Lower chest: No acute abnormality. 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 Hepatobiliary: No focal liver abnormality is seen. No gallstones, gallbladder wall thickening, or biliary dilatation. Pancreas: Unremarkable. No pancreatic ductal dilatation or surrounding inflammatory changes. Spleen: Normal in size without focal abnormality. Adrenals/Urinary Tract: Adrenal glands are within normal limits. Kidneys are well visualized bilaterally. No renal calculi or obstructive changes are seen. 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 The bladder is well distended. Stomach/Bowel: Appendix is within normal limits. No obstructive or inflammatory changes of the colon are seen. Small bowel and stomach are within normal limits. Vascular/Lymphatic: Aortic atherosclerosis. No enlarged abdominal or pelvic lymph nodes. Reproductive: Status post hysterectomy. No adnexal masses. Other: No abdominal wall hernia or abnormality. No abdominopelvic ascites. Musculoskeletal: No acute or significant osseous findings. IMPRESSION: No acute abnormality to correspond with the given clinical history. Electronically Signed   By: Oneil Devonshire M.D.   On: 03/02/2024 19:29     EKG:  Vent. rate 99 BPM PR interval 143 ms QRS duration 98 ms QT/QTcB 426/547 ms P-R-T axes 82 111 41 Sinus tachycardia Atrial premature complexes Right axis deviation Borderline low voltage, extremity leads Prolonged QT interval  Assessment & Plan:    Principal Problem:   Hyperosmolar hyperglycemic state (HHS) (HCC) Active Problems:   Type 2  diabetes mellitus with complication, with long-term current use of insulin  (HCC)   Hypertension   Class 2 obesity   Hyperlipidemia   Coronary artery disease   Diabetes mellitus, type II, uncontrolled with HHS - Patient poor historian, but she is sure she is not on any insulin , unsure if she has any oral diabetes medicine or not - Evidence of DKA, but workup significant for HHS - She will be started on insulin  HHS - Nauseous with vomiting, will keep n.p.o. -Sinew with aggressive IV fluid hydration - BMP every 4 hours  Hyponatremia - Corrected sodium is 125, so 116 is up pseudohyponatremia but remains hyponatremic despite correction - Due to volume depletion and dehydration - Continue with IV fluids, - Discontinue hydrochlorothiazide as will be contributing as well to hyponatremia   Abdominal pain, nausea, vomiting cough, Congestion -Finding most likely related to acute viral illness COVID, RSV and influenza negative -Will check respiratory panel, she has significant cough, congestion, encouraged use incentive spirometry, flutter valve, will start on Mucinex - Chest x-ray with no acute findings-CT abdomen pelvis with no acute findings - Will start on doxycycline (will avoid azithromycin due to prolonged QTc  Prolonged QTc - Monitor on telemetry and avoid prolonging agents, potassium more than 4, but will correct as anticipate it will drop, magnesium  more than 2  Lactic acidosis - Likely due to volume depletion, dehydration and HHS, continue with IV fluids   Coronary artery disease - Continue aspirin , resume Crestor  and metoprolol  when blood pressure is stable and able to take oral  HTN- -pressure is soft on presentation, continue to hold meds    Acute kidney injury on chronic kidney disease stage IIIb - Volume depletion and dehydration, continue with IV fluids and avoid nephrotoxic medications  Depression/Anxiety -Hold Cymbalta  due to prolonged QTc   Chronic pain  syndrome -Subutex , resume once stable, both on hold for prolonged QTc, resume gabapentin  when able to take oral   History of left-sided breast cancer -Continue Arimidex    DVT Prophylaxis Heparin    AM Labs Ordered, also please  review Full Orders  Family Communication: Admission, patients condition and plan of care including tests being ordered have been discussed with the patient and sister at bedside who indicate understanding and agree with the plan and Code Status.  Code Status and patient  Likely DC to home  Consults called: None  Admission status: Inpatient  Time spent in minutes : 70 minutes   Brayton Lye M.D on 03/02/2024 at 9:06 PM   [facility] - Office  [phone]

## 2024-03-02 NOTE — ED Provider Notes (Signed)
 [place] EMERGENCY DEPARTMENT AT [facility] Provider Note  CSN: [id number] Arrival date & time: 03/02/24 1754  Chief Complaint(s) No chief complaint on file.  HPI Katelyn Kaufman is a 73 y.o. female history of coronary artery disease, COPD, diabetes, hypertension presenting to the emergency department with nausea, vomiting, diarrhea.  Patient reports symptoms began since Monday, also reports having some cough.  Reports subjective fevers and chills.  Reports feeling very dehydrated and weak, fatigued.  Symptoms not improving so came to ER.  No chest pain.  No shortness of breath.  Does report some diffuse abdominal pain.  Past Medical History Past Medical History:  Diagnosis Date   Allergy    Anxiety    Arthritis    Breast cancer (HCC)    CAD (coronary artery disease) 2018   Severe trifurcation disease involving left main, ostial circumflex, 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   COPD (chronic obstructive pulmonary disease) (HCC)    Depression    Essential hypertension    Hyperlipidemia    Myocardial infarction (HCC)    [place] to the LAD   OSA (obstructive sleep apnea)    CPAP qHS   PONV (postoperative nausea and vomiting)    Type 2 diabetes mellitus (HCC)    Patient Active Problem List   Diagnosis Date Noted   Hyperosmolar hyperglycemic state (HHS) (HCC) 03/02/2024   New onset of headaches 11/22/2023   Cognitive impairment 11/22/2023   AKI (acute kidney injury) 10/26/2023   Chest pain 10/24/2023   Class 2 obesity 10/24/2023   Hot flashes related to aromatase inhibitor therapy 09/13/2023   Memory loss 05/31/2023   Chronic low back pain 05/31/2023   Breast abscess 08/17/2022   Medication management 08/17/2022   Hematoma of left breast 07/26/2022   Abscess after procedure 07/25/2022   Tobacco use disorder 07/25/2022   Thyromegaly 07/25/2022   S/P left mastectomy/H/o Lt Breast  Cancer 06/25/2022   Breast cancer of upper-outer quadrant of left female breast (HCC) 12/10/2021   Acute coronary syndrome (HCC)    Type 2 diabetes mellitus with complication, with long-term current use of insulin  (HCC) 11/16/2021   Hypertension 11/16/2021   Hyperlipidemia 11/16/2021   Acute myocardial infarction involving left main coronary artery (HCC) 11/16/2021   CAD---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 Authorizing Provider  Accu-Chek Softclix Lancets lancets USE TO TEST BLOOD SUGAR UP TO FOUR TIMES DAILY(IN THE MORNING BEFORE BREAKFAST AND 2 HOURS AFTER EACH MEAL). 12/23/23   [provider]  acetaminophen  (TYLENOL ) 325 MG tablet Take 2 tablets (650 mg total) by mouth every 6 (six) hours as needed for mild pain (or Fever >/= 101). 07/30/22   Pearlean Manus, MD  ALPRAZolam  (XANAX ) 0.5 MG tablet Take 1-2 tablets 30 minutes prior to MRI, may repeat once as needed. Must have driver. Patient not taking: Reported on 02/29/2024 11/22/23   Onita Duos, MD  amLODipine  (NORVASC ) 5 MG tablet Take 1 tablet (5 mg total) by mouth daily. 10/27/23   Krishnan, Gokul, MD  anastrozole  (ARIMIDEX ) 1 MG tablet TAKE 1 TABLET(1 MG) BY MOUTH DAILY 06/06/23   Rogers Hai, MD  aspirin  EC 81 MG tablet Take 1 tablet (81 mg total) by mouth daily. Swallow whole. 11/23/21  Dwan Aldo M, PA-C  azelastine (ASTELIN) 0.1 % nasal spray 1 spray. 01/06/24 01/05/25  [provider]  buprenorphine  (SUBUTEX ) 2 MG SUBL SL tablet Place 4 mg under the tongue every 8 (eight) hours. Patient taking differently: Place 2 mg under the tongue every 8 (eight) hours. 03/24/23   [provider]  Buprenorphine  HCl-Naloxone HCl 2-0.5 MG FILM (Schedule III Drug) Sublingual; Duration: 30    [provider]   buPROPion (WELLBUTRIN XL) 150 MG 24 hr tablet Take 150 mg by mouth every morning. 02/03/24 02/02/25  [provider]  celecoxib  (CELEBREX ) 50 MG capsule TAKE 1 CAPSULE(50 MG) BY MOUTH TWICE DAILY AS NEEDED FOR PAIN 01/24/24   Onita Duos, MD  cholecalciferol  (VITAMIN D3) 25 MCG (1000 UNIT) tablet Take 1,000 Units by mouth daily.    [provider]  Dextromethorphan-guaiFENesin (ROBITUSSIN COUGH+CHEST CONG DM) 5-100 MG/5ML LIQD Take 10 mLs by mouth. 02/28/24   [provider]  DULoxetine  (CYMBALTA ) 60 MG capsule Take 60 mg by mouth daily.    [provider]  ezetimibe  (ZETIA ) 10 MG tablet Take 1 tablet (10 mg total) by mouth daily. 10/27/23   Krishnan, Gokul, MD  fexofenadine (ALLEGRA) 180 MG tablet Take 180 mg by mouth daily.    [provider]  furosemide  (LASIX ) 40 MG tablet TAKE 1 AND 1/2 TABLETS(60 MG) BY MOUTH DAILY 11/03/23   Debera Jayson MATSU, MD  gabapentin  (NEURONTIN ) 100 MG capsule Take 400 mg by mouth. 01/06/24   [provider]  glimepiride  (AMARYL ) 4 MG tablet Take 4 mg by mouth daily with breakfast.    [provider]  glipiZIDE (GLUCOTROL) 5 MG tablet Take 5 mg by mouth.    [provider]  hydrochlorothiazide (HYDRODIURIL) 25 MG tablet Take 25 mg by mouth. 01/24/24   [provider]  hydrOXYzine  (ATARAX ) 25 MG tablet Take 1 tablet (25 mg total) by mouth daily. 01/24/24   Lamon Pleasant HERO, PA-C  insulin  glargine (LANTUS ) 100 UNIT/ML injection Inject 1 Units into the skin at bedtime.    [provider]  isosorbide  mononitrate (IMDUR ) 30 MG 24 hr tablet Take 1 tablet (30 mg total) by mouth daily. 07/31/22   Pearlean Manus, MD  losartan  (COZAAR ) 100 MG tablet Take 100 mg by mouth daily. 11/03/23   [provider]  montelukast (SINGULAIR) 10 MG tablet Take 10 mg by mouth. 02/03/24 02/02/25  [provider]  Multiple Vitamin (MULTI-VITAMIN) tablet Take 1 tablet by mouth daily.    [provider]  nitroGLYCERIN  (NITROSTAT ) 0.4 MG SL tablet Place 1 tablet (0.4 mg total) under the tongue every 5 (five) minutes x 3 doses as needed for chest pain (if no relief after 3rd dose, proceed to ED or call 911). 07/08/22   Debera Jayson MATSU, MD  ondansetron  (ZOFRAN -ODT) 4 MG disintegrating tablet Take 1 tablet (4 mg total) by mouth every 8 (eight) hours as needed for vomiting or nausea. For 7 days supply 09/15/23   Katragadda, Sreedhar, MD  pioglitazone (ACTOS) 45 MG tablet Take 45 mg by mouth. 01/06/24 04/05/24  [provider]  potassium chloride  SA (KLOR-CON  M) 20 MEQ tablet Take 2 tablets (40 mEq total) by mouth daily. Please resume on 10/31/2023 02/15/24   Miriam Norris, NP  pregabalin  (LYRICA ) 50 MG capsule Take 1 capsule (50 mg total) by mouth 3 (three) times daily as needed. 05/31/23   Onita Duos, MD  rosuvastatin  (CRESTOR ) 20 MG tablet Take 1 tablet (20 mg total) by mouth  daily. 10/04/23   Debera Jayson MATSU, MD  tiotropium (SPIRIVA ) 18 MCG inhalation capsule Place 1 capsule into inhaler and inhale daily. 02/03/24 02/02/25  [provider]  vitamin B-12 (CYANOCOBALAMIN ) 1000 MCG tablet Take 1,000 mcg by mouth daily.    [provider]                                                                                                                                    Past Surgical History Past Surgical History:  Procedure Laterality Date   BACK SURGERY     BREAST SURGERY     CATARACT EXTRACTION W/PHACO Right 10/24/2020   Procedure: CATARACT EXTRACTION PHACO AND INTRAOCULAR LENS PLACEMENT (IOC);  Surgeon: Harrie Agent, MD;  Location: [facility];  Service: Ophthalmology;  Laterality: Right;  CDE: 10.87   CATARACT EXTRACTION W/PHACO Left 12/01/2020   Procedure: CATARACT EXTRACTION PHACO AND INTRAOCULAR LENS PLACEMENT LEFT EYE;  Surgeon: Harrie Agent, MD;  Location: [facility];  Service: Ophthalmology;  Laterality: Left;  CDE  8.62   CORONARY ARTERY BYPASS GRAFT N/A  11/16/2021   Procedure: CORONARY ARTERY BYPASS GRAFTING (CABG) times three using the left internal mammary and right saphenous vein.;  Surgeon: Lucas Dorise POUR, MD;  Location: [facility] OR;  Service: Open Heart Surgery;  Laterality: N/A;   IABP INSERTION N/A 11/16/2021   Procedure: IABP Insertion;  Surgeon: Anner Alm ORN, MD;  Location: [facility] INVASIVE CV LAB;  Service: Cardiovascular;  Laterality: N/A;   LEFT HEART CATH AND CORONARY ANGIOGRAPHY N/A 11/16/2021   Procedure: LEFT HEART CATH AND CORONARY ANGIOGRAPHY;  Surgeon: Anner Alm ORN, MD;  Location: [facility] INVASIVE CV LAB;  Service: Cardiovascular;  Laterality: N/A;   REPLACEMENT TOTAL KNEE Right    SIMPLE MASTECTOMY WITH AXILLARY SENTINEL NODE BIOPSY Left 06/25/2022   Procedure: SIMPLE MASTECTOMY;  Surgeon: Mavis Anes, MD;  Location: [facility];  Service: General;  Laterality: Left;   TEE WITHOUT CARDIOVERSION N/A 11/16/2021   Procedure: TRANSESOPHAGEAL ECHOCARDIOGRAM (TEE);  Surgeon: Lucas Dorise POUR, MD;  Location: [facility] OR;  Service: Open Heart Surgery;  Laterality: N/A;   TUBAL LIGATION     WOUND EXPLORATION Left 07/26/2022   Procedure: EVACUATION OF HEMATOMA BREAST, DEBRIDEMENT OF LEFT BREAST WOUND;  Surgeon: Mavis Anes, MD;  Location: [facility];  Service: General;  Laterality: Left;   Family History Family History  Problem Relation Age of Onset   COPD Mother    Alcohol abuse Father    Hypertension Sister    Heart attack Brother     Social History Social History   Tobacco Use   Smoking status: Every Day    Current packs/day: 0.50    Average packs/day: 0.5 packs/day for 40.0 years (20.0 ttl pk-yrs)    Types: Cigarettes   Smokeless tobacco: Never  Vaping Use   Vaping status: Never Used  Substance Use Topics   Alcohol use: Never   Drug use: Never  Allergies Latex  Review of Systems Review of Systems  All other systems reviewed and are negative.   Physical Exam Vital Signs  I have reviewed the triage vital signs BP (!) 136/91    Pulse 98   Temp 98.6 F (37 C) (Oral)   Resp 20   Ht 5' 4 (1.626 m)   Wt 89.1 kg   SpO2 95%   BMI 33.72 kg/m  Physical Exam Vitals and nursing note reviewed.  Constitutional:      General: She is not in acute distress.    Appearance: She is well-developed.  HENT:     Head: Normocephalic and atraumatic.     Mouth/Throat:     Mouth: Mucous membranes are dry.  Eyes:     Pupils: Pupils are equal, round, and reactive to light.  Cardiovascular:     Rate and Rhythm: Normal rate and regular rhythm.     Heart sounds: No murmur heard. Pulmonary:     Effort: Pulmonary effort is normal. No respiratory distress.     Breath sounds: Normal breath sounds.  Abdominal:     General: Abdomen is flat.     Palpations: Abdomen is soft.     Tenderness: There is abdominal tenderness (generalized).  Musculoskeletal:        General: No tenderness.     Right lower leg: No edema.     Left lower leg: No edema.  Skin:    General: Skin is warm and dry.  Neurological:     General: No focal deficit present.     Mental Status: She is alert. Mental status is at baseline.     Cranial Nerves: No cranial nerve deficit.     Comments: Strength 5/5 ble and BUE  Psychiatric:        Mood and Affect: Mood normal.        Behavior: Behavior normal.     ED Results and Treatments Labs (all labs ordered are listed, but only abnormal results are displayed) Labs Reviewed  CBC WITH DIFFERENTIAL/PLATELET - Abnormal; Notable for the following components:      Result Value   WBC 11.8 (*)    Hemoglobin 15.2 (*)    Neutro Abs 8.7 (*)    All other components within normal limits  COMPREHENSIVE METABOLIC PANEL WITH GFR - Abnormal; Notable for the following components:   Sodium 116 (*)    Chloride 80 (*)    CO2 20 (*)    Glucose, Bld 653 (*)    BUN 47 (*)    Creatinine, Ser 3.16 (*)    Calcium  11.6 (*)    GFR, Estimated 15 (*)    All other components within normal limits  MAGNESIUM  - Abnormal; Notable for the  following components:   Magnesium  2.6 (*)    All other components within normal limits  LACTIC ACID, PLASMA - Abnormal; Notable for the following components:   Lactic Acid, Venous 3.0 (*)    All other components within normal limits  URINALYSIS, W/ REFLEX TO CULTURE (INFECTION SUSPECTED) - Abnormal; Notable for the following components:   APPearance HAZY (*)    Glucose, UA >=500 (*)    Leukocytes,Ua SMALL (*)    All other components within normal limits  RESP PANEL BY RT-PCR (RSV, FLU A&B, COVID)  RVPGX2  URINE CULTURE  LIPASE, BLOOD  LACTIC ACID, PLASMA  BASIC METABOLIC PANEL WITH GFR  BASIC METABOLIC PANEL WITH GFR  BASIC METABOLIC PANEL WITH GFR  BASIC METABOLIC PANEL WITH GFR  OSMOLALITY                                                                                                                          Radiology DG Chest Portable 1 View Result Date: 03/02/2024 CLINICAL DATA:  Shortness of breath, nausea, vomiting, diarrhea, and abdominal pain. Frequent urination. EXAM: PORTABLE CHEST 1 VIEW COMPARISON:  10/24/2023 FINDINGS: Postoperative changes in the mediastinum. Normal heart size and pulmonary vascularity. No focal airspace disease or consolidation in the lungs. No blunting of costophrenic angles. No pneumothorax. Mediastinal contours appear intact. Degenerative changes in spine and shoulders. IMPRESSION: No active disease. Electronically Signed   By: Elsie Gravely M.D.   On: 03/02/2024 19:42   CT ABDOMEN PELVIS WO CONTRAST Result Date: 03/02/2024 CLINICAL DATA:  Acute abdominal pain EXAM: CT ABDOMEN AND PELVIS WITHOUT CONTRAST TECHNIQUE: Multidetector CT imaging of the abdomen and pelvis was performed following the standard protocol without IV contrast. RADIATION DOSE REDUCTION: This exam was performed according to the departmental dose-optimization program which includes automated exposure control, adjustment of the mA and/or kV according to patient size and/or use of  iterative reconstruction technique. COMPARISON:  None Available. FINDINGS: Lower chest: No acute abnormality. 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 Hepatobiliary: No focal liver abnormality is seen. No gallstones, gallbladder wall thickening, or biliary dilatation. Pancreas: Unremarkable. No pancreatic ductal dilatation or surrounding inflammatory changes. Spleen: Normal in size without focal abnormality. Adrenals/Urinary Tract: Adrenal glands are within normal limits. Kidneys are well visualized bilaterally. No renal calculi or obstructive changes are seen. 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 The bladder is well distended. Stomach/Bowel: Appendix is within normal limits. No obstructive or inflammatory changes of the colon are seen. Small bowel and stomach are within normal limits. Vascular/Lymphatic: Aortic atherosclerosis. No enlarged abdominal or pelvic lymph nodes. Reproductive: Status post hysterectomy. No adnexal masses. Other: No abdominal wall hernia or abnormality. No abdominopelvic ascites. Musculoskeletal: No acute or significant osseous findings. IMPRESSION: No acute abnormality to correspond with the given clinical history. Electronically Signed   By: Oneil Devonshire M.D.   On: 03/02/2024 19:29    Pertinent labs & imaging results that were available during my care of the patient were reviewed by me and considered in my medical decision making (see MDM for details).  Medications Ordered in ED Medications  heparin  injection 5,000 Units (has no administration in time range)  lactated ringers  bolus 1,782 mL (has no administration in time range)  insulin  regular, human (MYXREDLIN ) 100 units/ 100 mL infusion (has no administration in time range)  lactated ringers  infusion (has no administration in time range)  dextrose  5 % in lactated ringers  infusion (has no administration in time range)  dextrose  50 % solution 0-50 mL (has no  administration in time range)  potassium chloride  10 mEq in 100 mL IVPB (has no administration in time range)  sodium chloride  0.9 % bolus 2,055 mL (2,055 mLs Intravenous New Bag/Given  03/02/24 1927)  insulin  aspart (novoLOG ) injection 10 Units (10 Units Intravenous Given 03/02/24 2022)                                                                                                                                     Procedures .Critical Care  Performed by: Francesca Elsie CROME, MD Authorized by: Francesca Elsie CROME, MD   Critical care provider statement:    Critical care time (minutes):  30   Critical care was necessary to treat or prevent imminent or life-threatening deterioration of the following conditions:  Metabolic crisis and dehydration   Critical care was time spent personally by me on the following activities:  Development of treatment plan with patient or surrogate, discussions with consultants, evaluation of patient's response to treatment, examination of patient, ordering and review of laboratory studies, ordering and review of radiographic studies, ordering and performing treatments and interventions, pulse oximetry, re-evaluation of patient's condition and review of old charts   (including critical care time)  Medical Decision Making / ED Course   MDM:  73 year old presenting to the emergency department with weakness, nausea, vomiting, diarrhea.  Suspect likely dehydration, possible HHS.  Labs concerning for hyponatremia, some component of pseudohyponatremia but does have actual hyponatremia also when correcting is around 125.  Also has AKI, hyperglycemia.  No evidence of DKA.  Lactic acid elevated likely due to dehydration.  Given cough, abdominal pain obtain further test including CT abdomen, chest x-ray without evidence of underlying intra-abdominal process or pneumonia.  Triage note also mentions possible left leg weakness but not present on exam.  Given significant  hyponatremia discussed with ICU doctor Dr. Maree, recommends that patient be admitted to [name] to medicine service, correction of metabolic abnormalities.  Does not think patient needs to be transferred for ICU care.  Discussed with Dr. Sherlon for admission.      Additional history obtained: -Additional history obtained from family -External records from outside source obtained and reviewed including: Chart review including previous notes, labs, imaging, consultation notes including prior notes    Lab Tests: -I ordered, reviewed, and interpreted labs.   The pertinent results include:   Labs Reviewed  CBC WITH DIFFERENTIAL/PLATELET - Abnormal; Notable for the following components:      Result Value   WBC 11.8 (*)    Hemoglobin 15.2 (*)    Neutro Abs 8.7 (*)    All other components within normal limits  COMPREHENSIVE METABOLIC PANEL WITH GFR - Abnormal; Notable for the following components:   Sodium 116 (*)    Chloride 80 (*)    CO2 20 (*)    Glucose, Bld 653 (*)    BUN 47 (*)    Creatinine, Ser 3.16 (*)    Calcium  11.6 (*)    GFR, Estimated 15 (*)    All other components within normal limits  MAGNESIUM  - Abnormal; Notable for the following components:  Magnesium  2.6 (*)    All other components within normal limits  LACTIC ACID, PLASMA - Abnormal; Notable for the following components:   Lactic Acid, Venous 3.0 (*)    All other components within normal limits  URINALYSIS, W/ REFLEX TO CULTURE (INFECTION SUSPECTED) - Abnormal; Notable for the following components:   APPearance HAZY (*)    Glucose, UA >=500 (*)    Leukocytes,Ua SMALL (*)    All other components within normal limits  RESP PANEL BY RT-PCR (RSV, FLU A&B, COVID)  RVPGX2  URINE CULTURE  LIPASE, BLOOD  LACTIC ACID, PLASMA  BASIC METABOLIC PANEL WITH GFR  BASIC METABOLIC PANEL WITH GFR  BASIC METABOLIC PANEL WITH GFR  BASIC METABOLIC PANEL WITH GFR  OSMOLALITY    Notable for lactic acidosis, aki,  hyperglycemia   Imaging Studies ordered: I ordered imaging studies including CT abdomen, CXR On my interpretation imaging demonstrates no acute process I independently visualized and interpreted imaging. I agree with the radiologist interpretation   Medicines ordered and prescription drug management: Meds ordered this encounter  Medications   sodium chloride  0.9 % bolus 2,055 mL   insulin  aspart (novoLOG ) injection 10 Units   heparin  injection 5,000 Units   lactated ringers  bolus 1,782 mL   insulin  regular, human (MYXREDLIN ) 100 units/ 100 mL infusion    EndoTool Goal Range::   140-180    Type of Diabetes:   Type 2    Mode of Therapy:   ENDOX2 for HHS    Start Method:   EndoTool to calculate   lactated ringers  infusion   dextrose  5 % in lactated ringers  infusion   dextrose  50 % solution 0-50 mL   potassium chloride  10 mEq in 100 mL IVPB    -I have reviewed the patients home medicines and have made adjustments as needed   Consultations Obtained: I requested consultation with the ICU,  and discussed lab and imaging findings as well as pertinent plan - they recommend: admit medicine   Social Determinants of Health:  Diagnosis or treatment significantly limited by social determinants of health: obesity   Reevaluation: After the interventions noted above, I reevaluated the patient and found that their symptoms have improved  Co morbidities that complicate the patient evaluation  Past Medical History:  Diagnosis Date   Allergy    Anxiety    Arthritis    Breast cancer (HCC)    CAD (coronary artery disease) 2018   Severe trifurcation disease involving left main, ostial circumflex, 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   COPD (chronic obstructive pulmonary disease) (HCC)    Depression    Essential hypertension    Hyperlipidemia    Myocardial infarction (HCC)    [place] to the LAD   OSA  (obstructive sleep apnea)    CPAP qHS   PONV (postoperative nausea and vomiting)    Type 2 diabetes mellitus (HCC)       Dispostion: Disposition decision including need for hospitalization was considered, and patient admitted to the hospital.    Final Clinical Impression(s) / ED Diagnoses Final diagnoses:  Dehydration  AKI (acute kidney injury)  Hyponatremia     This chart was dictated using voice recognition software.  Despite best efforts to proofread,  errors can occur which can change the documentation meaning.    Francesca Elsie CROME, MD 03/02/24 2105

## 2024-03-02 NOTE — Progress Notes (Signed)
 Incruse in room

## 2024-03-02 NOTE — ED Triage Notes (Signed)
 Pt comes in for n/v/d, abd pain (generalized), frequent urination, left leg is weak and keeps giving out on pt. Pt also has not been eating. All s/s started about 4 days ago. A&Ox4.

## 2024-03-03 DIAGNOSIS — Z794 Long term (current) use of insulin: Secondary | ICD-10-CM

## 2024-03-03 DIAGNOSIS — E118 Type 2 diabetes mellitus with unspecified complications: Secondary | ICD-10-CM | POA: Diagnosis not present

## 2024-03-03 DIAGNOSIS — I25119 Atherosclerotic heart disease of native coronary artery with unspecified angina pectoris: Secondary | ICD-10-CM

## 2024-03-03 DIAGNOSIS — E1100 Type 2 diabetes mellitus with hyperosmolarity without nonketotic hyperglycemic-hyperosmolar coma (NKHHC): Secondary | ICD-10-CM | POA: Diagnosis not present

## 2024-03-03 DIAGNOSIS — E782 Mixed hyperlipidemia: Secondary | ICD-10-CM | POA: Diagnosis not present

## 2024-03-03 LAB — RESPIRATORY PANEL BY PCR

## 2024-03-03 LAB — TROPONIN T, HIGH SENSITIVITY: Troponin T High Sensitivity: 181 ng/L (ref 0–19)

## 2024-03-03 LAB — GLUCOSE, CAPILLARY
Glucose-Capillary: 111 mg/dL — ABNORMAL HIGH (ref 70–99)
Glucose-Capillary: 114 mg/dL — ABNORMAL HIGH (ref 70–99)
Glucose-Capillary: 127 mg/dL — ABNORMAL HIGH (ref 70–99)
Glucose-Capillary: 159 mg/dL — ABNORMAL HIGH (ref 70–99)
Glucose-Capillary: 160 mg/dL — ABNORMAL HIGH (ref 70–99)
Glucose-Capillary: 170 mg/dL — ABNORMAL HIGH (ref 70–99)
Glucose-Capillary: 179 mg/dL — ABNORMAL HIGH (ref 70–99)
Glucose-Capillary: 188 mg/dL — ABNORMAL HIGH (ref 70–99)
Glucose-Capillary: 193 mg/dL — ABNORMAL HIGH (ref 70–99)
Glucose-Capillary: 195 mg/dL — ABNORMAL HIGH (ref 70–99)
Glucose-Capillary: 221 mg/dL — ABNORMAL HIGH (ref 70–99)
Glucose-Capillary: 224 mg/dL — ABNORMAL HIGH (ref 70–99)
Glucose-Capillary: 364 mg/dL — ABNORMAL HIGH (ref 70–99)

## 2024-03-03 LAB — BASIC METABOLIC PANEL WITH GFR
Anion gap: 14 (ref 5–15)
BUN: 40 mg/dL — ABNORMAL HIGH (ref 8–23)
CO2: 23 mmol/L (ref 22–32)
Calcium: 10.7 mg/dL — ABNORMAL HIGH (ref 8.9–10.3)
Chloride: 92 mmol/L — ABNORMAL LOW (ref 98–111)
Creatinine, Ser: 2.44 mg/dL — ABNORMAL HIGH (ref 0.44–1.00)
GFR, Estimated: 20 mL/min — ABNORMAL LOW (ref >60)
Glucose, Bld: 146 mg/dL — ABNORMAL HIGH (ref 70–99)
Potassium: 3.5 mmol/L (ref 3.5–5.1)
Sodium: 129 mmol/L — ABNORMAL LOW (ref 135–145)

## 2024-03-03 LAB — CBC
HCT: 43.7 % (ref 36.0–46.0)
Hemoglobin: 14.9 g/dL (ref 12.0–15.0)
MCH: 31.9 pg (ref 26.0–34.0)
MCHC: 34.1 g/dL (ref 30.0–36.0)
MCV: 93.6 fL (ref 80.0–100.0)
Platelets: 231 K/uL (ref 150–400)
RBC: 4.67 MIL/uL (ref 3.87–5.11)
RDW: 13.3 % (ref 11.5–15.5)
WBC: 13.3 K/uL — ABNORMAL HIGH (ref 4.0–10.5)
nRBC: 0 % (ref 0.0–0.2)

## 2024-03-03 LAB — LACTIC ACID, PLASMA
Lactic Acid, Venous: 3.8 mmol/L (ref 0.5–1.9)
Lactic Acid, Venous: 2.6 mmol/L (ref 0.5–1.9)

## 2024-03-03 MED ORDER — INSULIN ASPART 100 UNIT/ML IJ SOLN
0.0000 [IU] | Freq: Three times a day (TID) | INTRAMUSCULAR | Status: DC
  Administered 2024-03-03: 9 [IU] via SUBCUTANEOUS
  Administered 2024-03-03: 2 [IU] via SUBCUTANEOUS
  Administered 2024-03-04: 5 [IU] via SUBCUTANEOUS
  Administered 2024-03-04: 3 [IU] via SUBCUTANEOUS

## 2024-03-03 MED ORDER — DULOXETINE HCL 60 MG PO CPEP
60.0000 mg | ORAL_CAPSULE | Freq: Every day | ORAL | Status: DC
  Administered 2024-03-03 – 2024-03-09 (×7): 60 mg via ORAL
  Filled 2024-03-03 (×7): qty 1

## 2024-03-03 MED ORDER — GABAPENTIN 100 MG PO CAPS
200.0000 mg | ORAL_CAPSULE | Freq: Three times a day (TID) | ORAL | Status: DC
  Administered 2024-03-03 – 2024-03-09 (×17): 200 mg via ORAL
  Filled 2024-03-03 (×17): qty 2

## 2024-03-03 MED ORDER — MONTELUKAST SODIUM 10 MG PO TABS
10.0000 mg | ORAL_TABLET | Freq: Every day | ORAL | Status: DC
  Administered 2024-03-03 – 2024-03-08 (×6): 10 mg via ORAL
  Filled 2024-03-03 (×6): qty 1

## 2024-03-03 MED ORDER — ROSUVASTATIN CALCIUM 20 MG PO TABS
20.0000 mg | ORAL_TABLET | Freq: Every day | ORAL | Status: DC
  Administered 2024-03-03: 20 mg via ORAL
  Filled 2024-03-03: qty 1

## 2024-03-03 MED ORDER — CHLORHEXIDINE GLUCONATE CLOTH 2 % EX PADS
6.0000 | MEDICATED_PAD | Freq: Every day | CUTANEOUS | Status: DC
  Administered 2024-03-02 – 2024-03-09 (×7): 6 via TOPICAL

## 2024-03-03 MED ORDER — ASPIRIN 81 MG PO TBEC
81.0000 mg | DELAYED_RELEASE_TABLET | Freq: Every day | ORAL | Status: DC
  Administered 2024-03-03 – 2024-03-09 (×7): 81 mg via ORAL
  Filled 2024-03-03 (×8): qty 1

## 2024-03-03 MED ORDER — BUPROPION HCL ER (XL) 150 MG PO TB24
150.0000 mg | ORAL_TABLET | Freq: Every morning | ORAL | Status: DC
  Administered 2024-03-03 – 2024-03-09 (×7): 150 mg via ORAL
  Filled 2024-03-03 (×7): qty 1

## 2024-03-03 MED ORDER — ISOSORBIDE MONONITRATE ER 30 MG PO TB24: 30.0000 mg | ORAL_TABLET | Freq: Every day | ORAL | Status: DC

## 2024-03-03 MED ORDER — ROSUVASTATIN CALCIUM 5 MG PO TABS
10.0000 mg | ORAL_TABLET | Freq: Every day | ORAL | Status: DC
Start: 2024-03-04 — End: 2024-03-08
  Administered 2024-03-04 – 2024-03-08 (×5): 10 mg via ORAL
  Filled 2024-03-03: qty 1
  Filled 2024-03-03: qty 2
  Filled 2024-03-03 (×2): qty 1
  Filled 2024-03-03: qty 2

## 2024-03-03 MED ORDER — NITROGLYCERIN 0.4 MG SL SUBL
0.4000 mg | SUBLINGUAL_TABLET | SUBLINGUAL | Status: AC | PRN
Start: 2024-03-03 — End: ?
  Administered 2024-03-08 (×3): 0.4 mg via SUBLINGUAL
  Filled 2024-03-03 (×2): qty 1

## 2024-03-03 MED ORDER — LACTATED RINGERS IV SOLN: INTRAVENOUS | Status: AC

## 2024-03-03 MED ORDER — INSULIN ASPART 100 UNIT/ML IJ SOLN
4.0000 [IU] | Freq: Three times a day (TID) | INTRAMUSCULAR | Status: DC
  Administered 2024-03-03 (×2): 4 [IU] via SUBCUTANEOUS

## 2024-03-03 MED ORDER — AMLODIPINE BESYLATE 5 MG PO TABS
5.0000 mg | ORAL_TABLET | Freq: Every day | ORAL | Status: DC
  Administered 2024-03-04 – 2024-03-07 (×4): 5 mg via ORAL
  Filled 2024-03-03 (×4): qty 1

## 2024-03-03 MED ORDER — POTASSIUM CHLORIDE CRYS ER 20 MEQ PO TBCR
40.0000 meq | EXTENDED_RELEASE_TABLET | Freq: Once | ORAL | Status: AC
  Administered 2024-03-03: 40 meq via ORAL
  Filled 2024-03-03: qty 2

## 2024-03-03 MED ORDER — INSULIN GLARGINE 100 UNIT/ML ~~LOC~~ SOLN
14.0000 [IU] | Freq: Every day | SUBCUTANEOUS | Status: DC
Start: 2024-03-03 — End: 2024-03-04
  Administered 2024-03-03: 14 [IU] via SUBCUTANEOUS
  Filled 2024-03-03 (×3): qty 0.14

## 2024-03-03 MED ORDER — POTASSIUM CHLORIDE 10 MEQ/100ML IV SOLN: 10.0000 meq | INTRAVENOUS | Status: DC

## 2024-03-03 MED ORDER — INSULIN GLARGINE-YFGN 100 UNIT/ML ~~LOC~~ SOLN: 14.0000 [IU] | SUBCUTANEOUS | Status: DC

## 2024-03-03 NOTE — Progress Notes (Signed)
 PROGRESS NOTE   Katelyn Kaufman  FMW:[id number] DOB: 08-20-1950 DOA: 03/02/2024 PCP: [facility]   Level of care: Med-Surg  Brief Admission History:  73 y.o. female,  with medical history significant of anxiety, coronary artery disease, COPD, depression, hypertension, hyperlipidemia, OSA, type 2 diabetes mellitus. - Presents to ED secondary to complaints of nausea, vomiting, abdominal pain, cough and congestion, reports upper respiratory symptoms going on for last 2 weeks, denies any sick contact, reports subjective fever and chills at home, she reports abdominal pain, nausea and vomiting as well, has been going on for last 5 days, patient reports she is not on any insulin  anymore, not sure if she is in any oral hypoglycemic agents or not. -NAD CT abdomen pelvis with no acute findings, chest x-ray with no acute findings, COVID, RSV and influenza are negative, workup significant for low sodium at 116, but once corrected to per glycemia of 653 it is 125, creatinine up from 1.9 last week to 3.16, white blood cell count elevated at 11.8, lactic acid elevated at 3, UA significant for glucose urea but no ketonuria,.  [facility] hospitalist consulted to admit   Assessment and Plan:  HHS - Patient poor historian, but she is sure she is not on any insulin , unsure if she has any oral diabetes medicine or not - transition off IV insulin  to subcutaneous insulin  - advance diet to carb modified  CBG (last 3)  Recent Labs    03/03/24 0728 03/03/24 0842 03/03/24 0944  GLUCAP 195* 193* 179*    Hyponatremia - improved     Abdominal pain, nausea, vomiting cough, Congestion-- URI -Finding most likely related to acute viral illness COVID, RSV and influenza negative -Will check respiratory panel, she has significant cough, congestion, encouraged use incentive spirometry, flutter valve, will start on Mucinex - Chest x-ray with no acute findings-CT abdomen pelvis with no acute findings -  on  doxycycline   Prolonged QTc - Monitor on telemetry and avoid prolonging agents, potassium more than 4, but will correct as anticipate it will drop, magnesium  more than 2  Hypercalcemia - pt being worked up by her nephrologist Dr. 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 Rachele    Lactic acidosis - Likely due to volume depletion, trending down with IV fluids    Coronary artery disease - Continue aspirin , resume Crestor  and metoprolol  when blood pressure is stable and able to take oral   HTN- -pressure is soft on presentation, continue to hold meds    Acute kidney injury on chronic kidney disease stage IIIb - Volume depletion and dehydration, continue with IV fluids and avoid nephrotoxic medications   Depression/Anxiety -Hold Cymbalta  due to prolonged QTc   Chronic pain syndrome -Subutex , resume once stable, both on hold for prolonged QTc, resume gabapentin  when able to take oral    History of left-sided breast cancer -Continue Arimidex    DVT prophylaxis: sq heparin  Code Status: Full  Family Communication:  Disposition: anticipate return home in 1-2 days   Consultants:   Procedures:   Antimicrobials:    Subjective: Pt reporting having a mild headache, no more emesis, denies nausea  Objective: Vitals:   03/03/24 0753 03/03/24 0800 03/03/24 0900 03/03/24 0954  BP:  118/75 113/66   Pulse:  82 87 82  Resp:  14 19 18   Temp: 98 F (36.7 C)     TempSrc: Oral  SpO2:  97% 100%   Weight:      Height:        Intake/Output Summary (Last 24 hours) at 03/03/2024 1046 Last data filed at 03/03/2024 0858 Gross per 24 hour  Intake 6226 ml  Output 1750 ml  Net 4476 ml   Filed Weights   03/02/24 1800 03/02/24 2145 03/03/24 0500  Weight: 89.1 kg 91.2 kg 94.3 kg   Examination:  General exam: Appears calm and comfortable  Respiratory system: no increased work of  breathing.  Cardiovascular system: normal S1 & S2 heard. No JVD, murmurs, rubs, gallops or clicks. No pedal edema. Gastrointestinal system: Abdomen is nondistended, soft and nontender. No organomegaly or masses felt. Normal bowel sounds heard. Central nervous system: Alert and oriented. No focal neurological deficits. Extremities: Symmetric 5 x 5 power. Skin: No rashes, lesions or ulcers. Psychiatry: Judgement and insight appear normal. Mood & affect appropriate.   Data Reviewed: I have personally reviewed following labs and imaging studies  CBC: Recent Labs  Lab 03/02/24 1830 03/03/24 0255  WBC 11.8* 13.3*  NEUTROABS 8.7*  --   HGB 15.2* 14.9  HCT 43.0 43.7  MCV 91.3 93.6  PLT 257 231    Basic Metabolic Panel: Recent Labs  Lab 03/02/24 1830 03/02/24 2213 03/03/24 0255  NA 116* 131* 129*  K 4.4 3.4* 3.5  CL 80* 91* 92*  CO2 20* 24 23  GLUCOSE 653* 197* 146*  BUN 47* 44* 40*  CREATININE 3.16* 2.78* 2.44*  CALCIUM  11.6* 11.2* 10.7*  MG 2.6*  --   --     CBG: Recent Labs  Lab 03/03/24 0532 03/03/24 0633 03/03/24 0728 03/03/24 0842 03/03/24 0944  GLUCAP 224* 160* 195* 193* 179*    Recent Results (from the past 240 hours)  Resp panel by RT-PCR (RSV, Flu A&B, Covid) Anterior Nasal Swab     Status: None   Collection Time: 03/02/24  7:33 PM   Specimen: Anterior Nasal Swab  Result Value Ref Range Status   SARS Coronavirus 2 by RT PCR NEGATIVE NEGATIVE Final    Comment: (NOTE) SARS-CoV-2 target nucleic acids are NOT DETECTED.  The SARS-CoV-2 RNA is generally detectable in upper respiratory specimens during the acute phase of infection. The lowest concentration of SARS-CoV-2 viral copies this assay can detect is 138 copies/mL. A negative result does not preclude SARS-Cov-2 infection and should not be used as the sole basis for treatment or other patient management decisions. A negative result may occur with  improper specimen collection/handling, submission of  specimen other than nasopharyngeal swab, presence of viral mutation(s) within the areas targeted by this assay, and inadequate number of viral copies(<138 copies/mL). A negative result must be combined with clinical observations, patient history, and epidemiological information. The expected result is Negative.  Fact Sheet for Patients:  BloggerCourse.com  Fact Sheet for Healthcare Providers:  SeriousBroker.it  This test is no t yet approved or cleared by the [place]  FDA and  has been authorized for detection and/or diagnosis of SARS-CoV-2 by FDA under an Emergency Use Authorization (EUA). This EUA will remain  in effect (meaning this test can be used) for the duration of the COVID-19 declaration under Section 564(b)(1) of the Act, 21 U.S.C.section 360bbb-3(b)(1), unless the authorization is terminated  or revoked sooner.       Influenza A by PCR NEGATIVE NEGATIVE Final   Influenza B by PCR NEGATIVE NEGATIVE Final    Comment: (NOTE) The Xpert Xpress SARS-CoV-2/FLU/RSV plus assay is intended as an  aid in the diagnosis of influenza from Nasopharyngeal swab specimens and should not be used as a sole basis for treatment. Nasal washings and aspirates are unacceptable for Xpert Xpress SARS-CoV-2/FLU/RSV testing.  Fact Sheet for Patients: BloggerCourse.com  Fact Sheet for Healthcare Providers: SeriousBroker.it  This test is not yet approved or cleared by the [place]  FDA and has been authorized for detection and/or diagnosis of SARS-CoV-2 by FDA under an Emergency Use Authorization (EUA). This EUA will remain in effect (meaning this test can be used) for the duration of the COVID-19 declaration under Section 564(b)(1) of the Act, 21 U.S.C. section 360bbb-3(b)(1), unless the authorization is terminated or revoked.     Resp Syncytial Virus by PCR NEGATIVE NEGATIVE Final     Comment: (NOTE) Fact Sheet for Patients: BloggerCourse.com  Fact Sheet for Healthcare Providers: SeriousBroker.it  This test is not yet approved or cleared by the [place]  FDA and has been authorized for detection and/or diagnosis of SARS-CoV-2 by FDA under an Emergency Use Authorization (EUA). This EUA will remain in effect (meaning this test can be used) for the duration of the COVID-19 declaration under Section 564(b)(1) of the Act, 21 U.S.C. section 360bbb-3(b)(1), unless the authorization is terminated or revoked.  Performed at [facility], [address] [place], [place] [address]   MRSA Next Gen by PCR, Nasal     Status: None   Collection Time: 03/02/24 10:00 PM   Specimen: Nasal Mucosa; Nasal Swab  Result Value Ref Range Status   MRSA by PCR Next Gen NOT DETECTED NOT DETECTED Final    Comment: (NOTE) The GeneXpert MRSA Assay (FDA approved for NASAL specimens only), is one component of a comprehensive MRSA colonization surveillance program. It is not intended to diagnose MRSA infection nor to guide or monitor treatment for MRSA infections. Test performance is not FDA approved in patients less than 53 years old. Performed at [facility], [address], [place], [place] [address]      Radiology Studies: DG Chest Portable 1 View Result Date: 03/02/2024 CLINICAL DATA:  Shortness of breath, nausea, vomiting, diarrhea, and abdominal pain. Frequent urination. EXAM: PORTABLE CHEST 1 VIEW COMPARISON:  10/24/2023 FINDINGS: Postoperative changes in the mediastinum. Normal heart size and pulmonary vascularity. No focal airspace disease or consolidation in the lungs. No blunting of costophrenic angles. No pneumothorax. Mediastinal contours appear intact. Degenerative changes in spine and shoulders. IMPRESSION: No active disease. Electronically Signed   By: Elsie Gravely M.D.   On: 03/02/2024 19:42   CT ABDOMEN PELVIS WO  CONTRAST Result Date: 03/02/2024 CLINICAL DATA:  Acute abdominal pain EXAM: CT ABDOMEN AND PELVIS WITHOUT CONTRAST TECHNIQUE: Multidetector CT imaging of the abdomen and pelvis was performed following the standard protocol without IV contrast. RADIATION DOSE REDUCTION: This exam was performed according to the departmental dose-optimization program which includes automated exposure control, adjustment of the mA and/or kV according to patient size and/or use of iterative reconstruction technique. COMPARISON:  None Available. FINDINGS: Lower chest: No acute abnormality. 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 Hepatobiliary: No focal liver abnormality is seen. No gallstones, gallbladder wall thickening, or biliary dilatation. Pancreas: Unremarkable. No pancreatic ductal dilatation or surrounding inflammatory changes. Spleen: Normal in size without focal abnormality. Adrenals/Urinary Tract: Adrenal glands are within normal limits. Kidneys are well visualized bilaterally. No renal calculi or obstructive changes are seen. 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 The bladder is well distended. Stomach/Bowel: Appendix is within normal limits. No  obstructive or inflammatory changes of the colon are seen. Small bowel and stomach are within normal limits. Vascular/Lymphatic: Aortic atherosclerosis. No enlarged abdominal or pelvic lymph nodes. Reproductive: Status post hysterectomy. No adnexal masses. Other: No abdominal wall hernia or abnormality. No abdominopelvic ascites. Musculoskeletal: No acute or significant osseous findings. IMPRESSION: No acute abnormality to correspond with the given clinical history. Electronically Signed   By: Oneil Devonshire M.D.   On: 03/02/2024 19:29    Scheduled Meds:  anastrozole   1 mg Oral Daily   Chlorhexidine  Gluconate Cloth  6 each Topical Daily   guaiFENesin  1,200 mg Oral BID   heparin   5,000 Units Subcutaneous Q8H   insulin   aspart  0-9 Units Subcutaneous TID WC   insulin  aspart  4 Units Subcutaneous TID WC   insulin  glargine  14 Units Subcutaneous Daily   pantoprazole  (PROTONIX ) IV  40 mg Intravenous Q24H   umeclidinium bromide   1 puff Inhalation Daily   Continuous Infusions:  doxycycline (VIBRAMYCIN) IV 100 mg (03/03/24 0947)   lactated ringers        LOS: 1 day   Critical Care Procedure Note Authorized and Performed by: KYM Louder MD  Total Critical Care time:  58 mins Due to a high probability of clinically significant, life threatening deterioration, the patient required my highest level of preparedness to intervene emergently and I personally spent this critical care time directly and personally managing the patient.  This critical care time included obtaining a history; examining the patient, pulse oximetry; ordering and review of studies; arranging urgent treatment with development of a management plan; evaluation of patient's response of treatment; frequent reassessment; and discussions with other providers.  This critical care time was performed to assess and manage the high probability of imminent and life threatening deterioration that could result in multi-organ failure.  It was exclusive of separately billable procedures and treating other patients and teaching time.    Afton Louder, MD How to contact the [facility] Attending or Consulting provider 7A - 7P or covering provider during after hours 7P -7A, for this patient?  Check the care team in [facility] and look for a) attending/consulting [facility] provider listed and b) the [facility] team listed Log into [email or web address] to find provider on call.  Locate the [facility] provider you are looking for under [facility] and page to a number that you can be directly reached. If you still have difficulty reaching the provider, please page the [facility] (Director on Call) for the Hospitalists listed on amion for assistance.  03/03/2024, 10:46 AM

## 2024-03-03 NOTE — Plan of Care (Signed)
 This patient was admitted to [facility] overnight. The patient remains on an active infusion of IV insulin , titration per EndoTool. PIV access only.    Problem: Education: Goal: Knowledge of General Education information will improve Description: Including pain rating scale, medication(s)/side effects and non-pharmacologic comfort measures Outcome: Progressing   Problem: Health Behavior/Discharge Planning: Goal: Ability to manage health-related needs will improve Outcome: Progressing   Problem: Clinical Measurements: Goal: Ability to maintain clinical measurements within normal limits will improve Outcome: Progressing Goal: Will remain free from infection Outcome: Progressing Goal: Diagnostic test results will improve Outcome: Progressing Goal: Respiratory complications will improve Outcome: Progressing Goal: Cardiovascular complication will be avoided Outcome: Progressing   Problem: Activity: Goal: Risk for activity intolerance will decrease Outcome: Progressing   Problem: Nutrition: Goal: Adequate nutrition will be maintained Outcome: Progressing   Problem: Coping: Goal: Level of anxiety will decrease Outcome: Progressing   Problem: Elimination: Goal: Will not experience complications related to bowel motility Outcome: Progressing Goal: Will not experience complications related to urinary retention Outcome: Progressing   Problem: Pain Managment: Goal: General experience of comfort will improve and/or be controlled Outcome: Progressing   Problem: Safety: Goal: Ability to remain free from injury will improve Outcome: Progressing   Problem: Skin Integrity: Goal: Risk for impaired skin integrity will decrease Outcome: Progressing   Problem: Education: Goal: Ability to describe self-care measures that may prevent or decrease complications (Diabetes Survival Skills Education) will improve Outcome: Progressing Goal: Individualized Educational Video(s) Outcome:  Progressing   Problem: Coping: Goal: Ability to adjust to condition or change in health will improve Outcome: Progressing   Problem: Fluid Volume: Goal: Ability to maintain a balanced intake and output will improve Outcome: Progressing   Problem: Health Behavior/Discharge Planning: Goal: Ability to identify and utilize available resources and services will improve Outcome: Progressing Goal: Ability to manage health-related needs will improve Outcome: Progressing   Problem: Metabolic: Goal: Ability to maintain appropriate glucose levels will improve Outcome: Progressing   Problem: Nutritional: Goal: Maintenance of adequate nutrition will improve Outcome: Progressing Goal: Progress toward achieving an optimal weight will improve Outcome: Progressing   Problem: Skin Integrity: Goal: Risk for impaired skin integrity will decrease Outcome: Progressing   Problem: Tissue Perfusion: Goal: Adequacy of tissue perfusion will improve Outcome: Progressing   Problem: Education: Goal: Ability to describe self-care measures that may prevent or decrease complications (Diabetes Survival Skills Education) will improve Outcome: Progressing Goal: Individualized Educational Video(s) Outcome: Progressing   Problem: Cardiac: Goal: Ability to maintain an adequate cardiac output will improve Outcome: Progressing   Problem: Health Behavior/Discharge Planning: Goal: Ability to identify and utilize available resources and services will improve Outcome: Progressing Goal: Ability to manage health-related needs will improve Outcome: Progressing   Problem: Fluid Volume: Goal: Ability to achieve a balanced intake and output will improve Outcome: Progressing   Problem: Metabolic: Goal: Ability to maintain appropriate glucose levels will improve Outcome: Progressing   Problem: Nutritional: Goal: Maintenance of adequate nutrition will improve Outcome: Progressing Goal: Maintenance of adequate  weight for body size and type will improve Outcome: Progressing   Problem: Respiratory: Goal: Will regain and/or maintain adequate ventilation Outcome: Progressing   Problem: Urinary Elimination: Goal: Ability to achieve and maintain adequate renal perfusion and functioning will improve Outcome: Progressing   Problem: Education: Goal: Knowledge of disease or condition will improve Outcome: Progressing Goal: Knowledge of the prescribed therapeutic regimen will improve Outcome: Progressing Goal: Individualized Educational Video(s) Outcome: Progressing   Problem: Activity: Goal: Ability to tolerate increased activity  will improve Outcome: Progressing Goal: Will verbalize the importance of balancing activity with adequate rest periods Outcome: Progressing   Problem: Respiratory: Goal: Ability to maintain a clear airway will improve Outcome: Progressing Goal: Levels of oxygenation will improve Outcome: Progressing Goal: Ability to maintain adequate ventilation will improve Outcome: Progressing

## 2024-03-03 NOTE — Plan of Care (Signed)

## 2024-03-03 NOTE — TOC Progression Note (Signed)
  Transition of Care [facility]) - Inpatient Brief Assessment   Patient Details  Name: Katelyn Kaufman MRN: [id number] Date of Birth: May 02, 1951  [facility]) CM/SW Contact:    Sharlyne Stabs, RN Phone Number: 03/03/2024, 11:21 AM   Clinical Narrative:   Patient admitted with hyperosmolar hyperglycemic state. Son called Rn to state he lives in [place] , patient's sister are her only assistance and they are not able to care for her, he was not aware she was in the hospital until this morning.  MD updated, IPCM suggested getting PT eval. IPCM following.   Transition of Care Asessment: Insurance and Status: Insurance coverage has been reviewed Patient has primary care physician: Yes Home environment has been reviewed: Home alone Prior level of function:: need assistances Prior/Current Home Services: No current home services Social Drivers of Health Review: SDOH reviewed no interventions necessary Readmission risk has been reviewed: Yes Transition of care needs: no transition of care needs at this time     Barriers to Discharge: Continued Medical Work up

## 2024-03-03 NOTE — Hospital Course (Signed)
 73 y.o. female,  with medical history significant of anxiety, coronary artery disease, COPD, depression, hypertension, hyperlipidemia, OSA, type 2 diabetes mellitus. - Presents to ED secondary to complaints of nausea, vomiting, abdominal pain, cough and congestion, reports upper respiratory symptoms going on for last 2 weeks, denies any sick contact, reports subjective fever and chills at home, she reports abdominal pain, nausea and vomiting as well, has been going on for last 5 days, patient reports she is not on any insulin  anymore, not sure if she is in any oral hypoglycemic agents or not. -NAD CT abdomen pelvis with no acute findings, chest x-ray with no acute findings, COVID, RSV and influenza are negative, workup significant for low sodium at 116, but once corrected to per glycemia of 653 it is 125, creatinine up from 1.9 last week to 3.16, white blood cell count elevated at 11.8, lactic acid elevated at 3, UA significant for glucose urea but no ketonuria,.  [facility] hospitalist consulted to admit

## 2024-03-03 NOTE — Progress Notes (Addendum)
 HOSPITAL MEDICINE OVERNIGHT EVENT NOTE    Notified by nursing that patient is complaining of midsternal pressure-like chest discomfort.  Vital signs are stable.  Patient is currently not exhibiting any shortness of breath.  EKG obtained revealing slightly prolonged QT, normal sinus rhythm and what appears to be new T wave inversions in the anterior septal leads.  Considering patient's history of coronary artery disease we will administer as needed nitroglycerin  and repeat EKG depending on clinical response.  Will additionally obtain serial troponins.  Will follow closely.  Zachary JINNY Ba  MD [facility]    ADDENDUM (10:18pm)  Troponin found to be slightly elevated at 181.  Patient has remained chest pain-free ever since the first dose of nitroglycerin  was administered earlier this evening.  Repeat EKG reveals improving T wave inversions in the anterior septal leads.  Will obtain serial troponin.  If trajectory of elevation is flat we will continue to monitor.  If trajectory of elevation reveals continued rise, we will obtain cardiology consultation with urgency of consultation to be determined by clinical course throughout the evening.  Zachary JINNY Dax Murguia  ADDENDUM (12:45am 10/12)  Second troponin is 173 revealing a slight downtrend or flat trajectory of elevation.  Patient continues to be chest pain-free and is resting comfortably.  Considering flat trajectory and no further symptoms, no need for full dose anticoagulation at this time.  Nonurgent cardiology consultation to be obtained in the morning by the day team for further guidance on workup and management.  Zachary JINNY [place]elbach

## 2024-03-04 ENCOUNTER — Inpatient Hospital Stay (HOSPITAL_COMMUNITY)

## 2024-03-04 DIAGNOSIS — I25119 Atherosclerotic heart disease of native coronary artery with unspecified angina pectoris: Secondary | ICD-10-CM | POA: Diagnosis not present

## 2024-03-04 DIAGNOSIS — E782 Mixed hyperlipidemia: Secondary | ICD-10-CM | POA: Diagnosis not present

## 2024-03-04 DIAGNOSIS — I10 Essential (primary) hypertension: Secondary | ICD-10-CM

## 2024-03-04 DIAGNOSIS — E1100 Type 2 diabetes mellitus with hyperosmolarity without nonketotic hyperglycemic-hyperosmolar coma (NKHHC): Secondary | ICD-10-CM | POA: Diagnosis not present

## 2024-03-04 LAB — CBC WITH DIFFERENTIAL/PLATELET
Abs Immature Granulocytes: 0.05 K/uL (ref 0.00–0.07)
Basophils Absolute: 0 K/uL (ref 0.0–0.1)
Basophils Relative: 0 %
Eosinophils Absolute: 0 K/uL (ref 0.0–0.5)
Eosinophils Relative: 0 %
HCT: 37.5 % (ref 36.0–46.0)
Hemoglobin: 13 g/dL (ref 12.0–15.0)
Immature Granulocytes: 1 %
Lymphocytes Relative: 31 %
Lymphs Abs: 2.9 K/uL (ref 0.7–4.0)
MCH: 32.2 pg (ref 26.0–34.0)
MCHC: 34.7 g/dL (ref 30.0–36.0)
MCV: 92.8 fL (ref 80.0–100.0)
Monocytes Absolute: 0.9 K/uL (ref 0.1–1.0)
Monocytes Relative: 10 %
Neutro Abs: 5.6 K/uL (ref 1.7–7.7)
Neutrophils Relative %: 58 %
Platelets: 221 K/uL (ref 150–400)
RBC: 4.04 MIL/uL (ref 3.87–5.11)
RDW: 13.3 % (ref 11.5–15.5)
WBC: 9.5 K/uL (ref 4.0–10.5)
nRBC: 0 % (ref 0.0–0.2)

## 2024-03-04 LAB — BASIC METABOLIC PANEL WITH GFR
Anion gap: 10 (ref 5–15)
BUN: 29 mg/dL — ABNORMAL HIGH (ref 8–23)
CO2: 24 mmol/L (ref 22–32)
Calcium: 10.2 mg/dL (ref 8.9–10.3)
Chloride: 96 mmol/L — ABNORMAL LOW (ref 98–111)
Creatinine, Ser: 1.87 mg/dL — ABNORMAL HIGH (ref 0.44–1.00)
GFR, Estimated: 28 mL/min — ABNORMAL LOW (ref >60)
Glucose, Bld: 236 mg/dL — ABNORMAL HIGH (ref 70–99)
Potassium: 4 mmol/L (ref 3.5–5.1)
Sodium: 130 mmol/L — ABNORMAL LOW (ref 135–145)

## 2024-03-04 LAB — HEMOGLOBIN A1C
Hgb A1c MFr Bld: 10.9 % — ABNORMAL HIGH (ref 4.8–5.6)
Mean Plasma Glucose: 266.13 mg/dL

## 2024-03-04 LAB — GLUCOSE, CAPILLARY
Glucose-Capillary: 258 mg/dL — ABNORMAL HIGH (ref 70–99)
Glucose-Capillary: 224 mg/dL — ABNORMAL HIGH (ref 70–99)
Glucose-Capillary: 294 mg/dL — ABNORMAL HIGH (ref 70–99)
Glucose-Capillary: 204 mg/dL — ABNORMAL HIGH (ref 70–99)
Glucose-Capillary: 229 mg/dL — ABNORMAL HIGH (ref 70–99)

## 2024-03-04 LAB — URINE CULTURE

## 2024-03-04 LAB — TROPONIN T, HIGH SENSITIVITY: Troponin T High Sensitivity: 173 ng/L (ref 0–19)

## 2024-03-04 LAB — MAGNESIUM: Magnesium: 1.9 mg/dL (ref 1.7–2.4)

## 2024-03-04 LAB — PROCALCITONIN: Procalcitonin: <0.1 ng/mL

## 2024-03-04 MED ORDER — INSULIN GLARGINE 100 UNIT/ML ~~LOC~~ SOLN
30.0000 [IU] | Freq: Every day | SUBCUTANEOUS | Status: DC
  Administered 2024-03-05: 30 [IU] via SUBCUTANEOUS
  Filled 2024-03-04 (×2): qty 0.3

## 2024-03-04 MED ORDER — INSULIN ASPART 100 UNIT/ML IJ SOLN
0.0000 [IU] | Freq: Every day | INTRAMUSCULAR | Status: DC
  Administered 2024-03-04 – 2024-03-07 (×2): 2 [IU] via SUBCUTANEOUS

## 2024-03-04 MED ORDER — ACETAMINOPHEN 325 MG PO TABS
650.0000 mg | ORAL_TABLET | Freq: Four times a day (QID) | ORAL | Status: DC | PRN
  Administered 2024-03-04 – 2024-03-07 (×3): 650 mg via ORAL
  Filled 2024-03-04 (×3): qty 2

## 2024-03-04 MED ORDER — DOXYCYCLINE HYCLATE 100 MG PO TABS
100.0000 mg | ORAL_TABLET | Freq: Two times a day (BID) | ORAL | Status: DC
  Administered 2024-03-04 – 2024-03-05 (×3): 100 mg via ORAL
  Filled 2024-03-04 (×3): qty 1

## 2024-03-04 MED ORDER — INSULIN ASPART 100 UNIT/ML IJ SOLN
10.0000 [IU] | Freq: Three times a day (TID) | INTRAMUSCULAR | Status: DC
  Administered 2024-03-04 – 2024-03-05 (×3): 10 [IU] via SUBCUTANEOUS

## 2024-03-04 MED ORDER — INSULIN ASPART 100 UNIT/ML IJ SOLN
8.0000 [IU] | Freq: Three times a day (TID) | INTRAMUSCULAR | Status: DC
  Administered 2024-03-04 (×2): 8 [IU] via SUBCUTANEOUS

## 2024-03-04 MED ORDER — PANTOPRAZOLE SODIUM 40 MG PO TBEC
40.0000 mg | DELAYED_RELEASE_TABLET | Freq: Every day | ORAL | Status: DC
  Administered 2024-03-04 – 2024-03-09 (×6): 40 mg via ORAL
  Filled 2024-03-04 (×6): qty 1

## 2024-03-04 MED ORDER — INSULIN GLARGINE 100 UNIT/ML ~~LOC~~ SOLN
26.0000 [IU] | Freq: Every day | SUBCUTANEOUS | Status: DC
  Administered 2024-03-04: 26 [IU] via SUBCUTANEOUS
  Filled 2024-03-04 (×2): qty 0.26

## 2024-03-04 MED ORDER — INSULIN ASPART 100 UNIT/ML IJ SOLN
0.0000 [IU] | Freq: Three times a day (TID) | INTRAMUSCULAR | Status: DC
  Administered 2024-03-04 – 2024-03-05 (×2): 7 [IU] via SUBCUTANEOUS
  Administered 2024-03-05: 4 [IU] via SUBCUTANEOUS
  Administered 2024-03-05: 7 [IU] via SUBCUTANEOUS
  Administered 2024-03-06: 4 [IU] via SUBCUTANEOUS
  Administered 2024-03-07 (×2): 3 [IU] via SUBCUTANEOUS
  Administered 2024-03-07 – 2024-03-08 (×3): 4 [IU] via SUBCUTANEOUS
  Administered 2024-03-08: 3 [IU] via SUBCUTANEOUS
  Administered 2024-03-09 (×2): 4 [IU] via SUBCUTANEOUS

## 2024-03-04 MED ORDER — FAMOTIDINE IN NACL 20-0.9 MG/50ML-% IV SOLN
20.0000 mg | Freq: Once | INTRAVENOUS | Status: AC
  Administered 2024-03-04: 20 mg via INTRAVENOUS
  Filled 2024-03-04: qty 50

## 2024-03-04 MED ORDER — BUPRENORPHINE HCL 2 MG SL SUBL: 2.0000 mg | SUBLINGUAL_TABLET | Freq: Three times a day (TID) | SUBLINGUAL | Status: DC

## 2024-03-04 NOTE — Progress Notes (Signed)
 PROGRESS NOTE   Katelyn Kaufman  FMW:[id number] DOB: 09/11/1950 DOA: 03/02/2024 PCP: [facility]   Level of care: Med-Surg  Brief Admission History:  73 y.o. female,  with medical history significant of anxiety, coronary artery disease, COPD, depression, hypertension, hyperlipidemia, OSA, type 2 diabetes mellitus. - Presents to ED secondary to complaints of nausea, vomiting, abdominal pain, cough and congestion, reports upper respiratory symptoms going on for last 2 weeks, denies any sick contact, reports subjective fever and chills at home, she reports abdominal pain, nausea and vomiting as well, has been going on for last 5 days, patient reports she is not on any insulin  anymore, not sure if she is in any oral hypoglycemic agents or not. -NAD CT abdomen pelvis with no acute findings, chest x-ray with no acute findings, COVID, RSV and influenza are negative, workup significant for low sodium at 116, but once corrected to per glycemia of 653 it is 125, creatinine up from 1.9 last week to 3.16, white blood cell count elevated at 11.8, lactic acid elevated at 3, UA significant for glucose urea but no ketonuria,.  [facility] consulted to admit   Assessment and Plan:  HHS - Patient poor historian, but she is sure she is not on any insulin , unsure if she has any oral diabetes medicine or not - transition off IV insulin  to subcutaneous insulin  - increasing glargine to 30 units, novolog  10 units TID with meals, aggressive SSI coverage and continue frequent CBG monitoring.  - advance diet to carb modified  CBG (last 3)  Recent Labs    03/04/24 0327 03/04/24 0713 03/04/24 1130  GLUCAP 258* 224* 294*    Hyponatremia - improving  Chest Pain - pt presented early this morning with an acute chest pain episode and having recurrent chest pain - HS troponins have been flat and ACS ruled out  - check portable CXR -- no acute findings  - given recurrent CP symptoms will ask for  cardiology consult available tomorrow morning - IV famotidine  and PPI ordered to treat for possible GI causes     Abdominal pain, nausea, vomiting cough, Congestion-- URI -Finding most likely related to acute viral illness COVID, RSV and influenza negative -Will check respiratory panel, she has significant cough, congestion, encouraged use incentive spirometry, flutter valve, will start on Mucinex - Chest x-ray with no acute findings-CT abdomen pelvis with no acute findings -  on doxycycline - check procalcitonin and if negative would stop antibiotics    Prolonged QTc - Monitor on telemetry and avoid prolonging agents, potassium more than 4, but will correct as anticipate it will drop, magnesium  more than 2  Hypercalcemia - pt being worked up by her nephrologist Dr. 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 Rachele    Lactic acidosis - Likely due to volume depletion, trended down with IV fluids    Coronary artery disease - Continue aspirin , resume Crestor  and metoprolol  when blood pressure is stable and able to take oral -- cardiology consult due to recurrent complaints of chest pain    HTN- -pressure is soft on presentation, continue to hold meds  Acute kidney injury on chronic kidney disease stage IIIb - Volume depletion and dehydration, continue with IV fluids and avoid nephrotoxic medications   Depression/Anxiety -Hold Cymbalta  due to prolonged QTc   Chronic pain syndrome -Subutex , resume once stable, both on hold for prolonged QTc, resume gabapentin  when able to  take oral    History of left-sided breast cancer -Continue Arimidex    DVT prophylaxis: sq heparin  Code Status: Full  Family Communication:  Disposition: anticipate return home when cleared by cardiology    Consultants:   Procedures:   Antimicrobials:    Subjective: Pt reporting having a mild headache, no more emesis,  denies nausea  Objective: Vitals:   03/03/24 2354 03/04/24 0328 03/04/24 0855 03/04/24 1423  BP: 105/61 120/69  (!) 95/59  Pulse: 94 92  100  Resp: 16 16  20   Temp: 98.2 F (36.8 C) 98 F (36.7 C)  98.6 F (37 C)  TempSrc: Oral Oral  Oral  SpO2: 94% 94% 94% 98%  Weight:      Height:        Intake/Output Summary (Last 24 hours) at 03/04/2024 1523 Last data filed at 03/04/2024 1300 Gross per 24 hour  Intake 1040 ml  Output 1900 ml  Net -860 ml   Filed Weights   03/02/24 1800 03/02/24 2145 03/03/24 0500  Weight: 89.1 kg 91.2 kg 94.3 kg   Examination:  General exam: Appears calm and comfortable  Respiratory system: no increased work of breathing.  Cardiovascular system: normal S1 & S2 heard. No JVD, murmurs, rubs, gallops or clicks. No pedal edema. Gastrointestinal system: Abdomen is nondistended, soft and nontender. No organomegaly or masses felt. Normal bowel sounds heard. Central nervous system: Alert and oriented. No focal neurological deficits. Extremities: Symmetric 5 x 5 power. Skin: No rashes, lesions or ulcers. Psychiatry: Judgement and insight appear normal. Mood & affect appropriate.   Data Reviewed: I have personally reviewed following labs and imaging studies  CBC: Recent Labs  Lab 03/02/24 1830 03/03/24 0255 03/04/24 0018  WBC 11.8* 13.3* 9.5  NEUTROABS 8.7*  --  5.6  HGB 15.2* 14.9 13.0  HCT 43.0 43.7 37.5  MCV 91.3 93.6 92.8  PLT 257 231 221    Basic Metabolic Panel: Recent Labs  Lab 03/02/24 1830 03/02/24 2213 03/03/24 0255 03/04/24 0018  NA 116* 131* 129* 130*  K 4.4 3.4* 3.5 4.0  CL 80* 91* 92* 96*  CO2 20* 24 23 24   GLUCOSE 653* 197* 146* 236*  BUN 47* 44* 40* 29*  CREATININE 3.16* 2.78* 2.44* 1.87*  CALCIUM  11.6* 11.2* 10.7* 10.2  MG 2.6*  --   --  1.9    CBG: Recent Labs  Lab 03/03/24 1559 03/03/24 2205 03/04/24 0327 03/04/24 0713 03/04/24 1130  GLUCAP 364* 221* 258* 224* 294*    Recent Results (from the past 240  hours)  Resp panel by RT-PCR (RSV, Flu A&B, Covid) Anterior Nasal Swab     Status: None   Collection Time: 03/02/24  7:33 PM   Specimen: Anterior Nasal Swab  Result Value Ref Range Status   SARS Coronavirus 2 by RT PCR NEGATIVE NEGATIVE Final    Comment: (NOTE) SARS-CoV-2 target nucleic acids are NOT DETECTED.  The SARS-CoV-2 RNA is generally detectable in upper respiratory specimens during the acute phase of infection. The lowest concentration of SARS-CoV-2 viral copies this assay can detect is 138 copies/mL. A negative result does not preclude SARS-Cov-2 infection and should not be used as the sole basis for treatment or other patient management decisions. A negative result may occur with  improper specimen collection/handling, submission of specimen other than nasopharyngeal swab, presence of viral mutation(s) within the areas targeted by this assay, and inadequate number of viral copies(<138 copies/mL). A negative result must be combined with clinical observations, patient history, and  epidemiological information. The expected result is Negative.  Fact Sheet for Patients:  BloggerCourse.com  Fact Sheet for Healthcare Providers:  SeriousBroker.it  This test is no t yet approved or cleared by the [place]  FDA and  has been authorized for detection and/or diagnosis of SARS-CoV-2 by FDA under an Emergency Use Authorization (EUA). This EUA will remain  in effect (meaning this test can be used) for the duration of the COVID-19 declaration under Section 564(b)(1) of the Act, 21 U.S.C.section 360bbb-3(b)(1), unless the authorization is terminated  or revoked sooner.       Influenza A by PCR NEGATIVE NEGATIVE Final   Influenza B by PCR NEGATIVE NEGATIVE Final    Comment: (NOTE) The Xpert Xpress SARS-CoV-2/FLU/RSV plus assay is intended as an aid in the diagnosis of influenza from Nasopharyngeal swab specimens and should not  be used as a sole basis for treatment. Nasal washings and aspirates are unacceptable for Xpert Xpress SARS-CoV-2/FLU/RSV testing.  Fact Sheet for Patients: BloggerCourse.com  Fact Sheet for Healthcare Providers: SeriousBroker.it  This test is not yet approved or cleared by the [place]  FDA and has been authorized for detection and/or diagnosis of SARS-CoV-2 by FDA under an Emergency Use Authorization (EUA). This EUA will remain in effect (meaning this test can be used) for the duration of the COVID-19 declaration under Section 564(b)(1) of the Act, 21 U.S.C. section 360bbb-3(b)(1), unless the authorization is terminated or revoked.     Resp Syncytial Virus by PCR NEGATIVE NEGATIVE Final    Comment: (NOTE) Fact Sheet for Patients: BloggerCourse.com  Fact Sheet for Healthcare Providers: SeriousBroker.it  This test is not yet approved or cleared by the [place]  FDA and has been authorized for detection and/or diagnosis of SARS-CoV-2 by FDA under an Emergency Use Authorization (EUA). This EUA will remain in effect (meaning this test can be used) for the duration of the COVID-19 declaration under Section 564(b)(1) of the Act, 21 U.S.C. section 360bbb-3(b)(1), unless the authorization is terminated or revoked.  Performed at [facility], [address], [place], [place] [address]   Urine Culture     Status: Abnormal   Collection Time: 03/02/24  8:00 PM   Specimen: Urine, Random  Result Value Ref Range Status   Specimen Description   Final    URINE, RANDOM Performed at [facility], [address], [place], [place] [address]    Special Requests   Final    NONE Reflexed from [phone] Performed at [facility], [address], [place], [place] [address]    Culture MULTIPLE SPECIES PRESENT, SUGGEST RECOLLECTION (A)  Final   Report Status 03/04/2024 FINAL  Final  MRSA Next  Gen by PCR, Nasal     Status: None   Collection Time: 03/02/24 10:00 PM   Specimen: Nasal Mucosa; Nasal Swab  Result Value Ref Range Status   MRSA by PCR Next Gen NOT DETECTED NOT DETECTED Final    Comment: (NOTE) The GeneXpert MRSA Assay (FDA approved for NASAL specimens only), is one component of a comprehensive MRSA colonization surveillance program. It is not intended to diagnose MRSA infection nor to guide or monitor treatment for MRSA infections. Test performance is not FDA approved in patients less than 30 years old. Performed at [facility], [address] [place], [place] [address]   Respiratory (~20 pathogens) panel by PCR     Status: None   Collection Time: 03/02/24 10:20 PM   Specimen: Nasopharyngeal Swab; Respiratory  Result Value Ref Range Status   Adenovirus  NOT DETECTED NOT DETECTED Final   Coronavirus 229E NOT DETECTED NOT DETECTED Final    Comment: (NOTE) The Coronavirus on the Respiratory Panel, DOES NOT test for the novel  Coronavirus (2019 nCoV)    Coronavirus HKU1 NOT DETECTED NOT DETECTED Final   Coronavirus NL63 NOT DETECTED NOT DETECTED Final   Coronavirus OC43 NOT DETECTED NOT DETECTED Final   Metapneumovirus NOT DETECTED NOT DETECTED Final   Rhinovirus / Enterovirus NOT DETECTED NOT DETECTED Final   Influenza A NOT DETECTED NOT DETECTED Final   Influenza B NOT DETECTED NOT DETECTED Final   Parainfluenza Virus 1 NOT DETECTED NOT DETECTED Final   Parainfluenza Virus 2 NOT DETECTED NOT DETECTED Final   Parainfluenza Virus 3 NOT DETECTED NOT DETECTED Final   Parainfluenza Virus 4 NOT DETECTED NOT DETECTED Final   Respiratory Syncytial Virus NOT DETECTED NOT DETECTED Final   Bordetella pertussis NOT DETECTED NOT DETECTED Final   Bordetella Parapertussis NOT DETECTED NOT DETECTED Final   Chlamydophila pneumoniae NOT DETECTED NOT DETECTED Final   Mycoplasma pneumoniae NOT DETECTED NOT DETECTED Final    Comment: Performed at [facility], [address], [place], [place] [address]     Radiology Studies: DG CHEST PORT 1 VIEW Result Date: 03/04/2024 CLINICAL DATA:  355200 Chest pain 355200 EXAM: PORTABLE CHEST 1 VIEW COMPARISON:  March 02, 2024, June 27, 2023 FINDINGS: The cardiomediastinal silhouette is unchanged in contour.Status post median sternotomy. Atherosclerotic calcifications. No pleural effusion. No pneumothorax. No acute pleuroparenchymal abnormality. IMPRESSION: No acute cardiopulmonary abnormality. Electronically Signed   By: Corean Salter M.D.   On: 03/04/2024 10:21   DG Chest Portable 1 View Result Date: 03/02/2024 CLINICAL DATA:  Shortness of breath, nausea, vomiting, diarrhea, and abdominal pain. Frequent urination. EXAM: PORTABLE CHEST 1 VIEW COMPARISON:  10/24/2023 FINDINGS: Postoperative changes in the mediastinum. Normal heart size and pulmonary vascularity. No focal airspace disease or consolidation in the lungs. No blunting of costophrenic angles. No pneumothorax. Mediastinal contours appear intact. Degenerative changes in spine and shoulders. IMPRESSION: No active disease. Electronically Signed   By: Elsie Gravely M.D.   On: 03/02/2024 19:42   CT ABDOMEN PELVIS WO CONTRAST Result Date: 03/02/2024 CLINICAL DATA:  Acute abdominal pain EXAM: CT ABDOMEN AND PELVIS WITHOUT CONTRAST TECHNIQUE: Multidetector CT imaging of the abdomen and pelvis was performed following the standard protocol without IV contrast. RADIATION DOSE REDUCTION: This exam was performed according to the departmental dose-optimization program which includes automated exposure control, adjustment of the mA and/or kV according to patient size and/or use of iterative reconstruction technique. COMPARISON:  None Available. FINDINGS: Lower chest: No acute abnormality. 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 Hepatobiliary: No focal liver abnormality is seen. No gallstones, gallbladder wall thickening, or biliary dilatation. Pancreas: Unremarkable. No  pancreatic ductal dilatation or surrounding inflammatory changes. Spleen: Normal in size without focal abnormality. Adrenals/Urinary Tract: Adrenal glands are within normal limits. Kidneys are well visualized bilaterally. No renal calculi or obstructive changes are seen. 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 The bladder is well distended. Stomach/Bowel: Appendix is within normal limits. No obstructive or inflammatory changes of the colon are seen. Small bowel and stomach are within normal limits. Vascular/Lymphatic: Aortic atherosclerosis. No enlarged abdominal or pelvic lymph nodes. Reproductive: Status post hysterectomy. No adnexal masses. Other: No abdominal wall hernia or abnormality. No abdominopelvic ascites. Musculoskeletal: No acute or significant osseous findings. IMPRESSION: No acute abnormality to correspond with the given clinical history.  Electronically Signed   By: Oneil Devonshire M.D.   On: 03/02/2024 19:29   Scheduled Meds:  amLODipine   5 mg Oral Daily   anastrozole   1 mg Oral Daily   aspirin  EC  81 mg Oral Daily   buPROPion  150 mg Oral q morning   Chlorhexidine  Gluconate Cloth  6 each Topical Daily   doxycycline  100 mg Oral Q12H   DULoxetine   60 mg Oral Daily   gabapentin   200 mg Oral TID   guaiFENesin  1,200 mg Oral BID   heparin   5,000 Units Subcutaneous Q8H   insulin  aspart  0-20 Units Subcutaneous TID WC   insulin  aspart  0-5 Units Subcutaneous QHS   insulin  aspart  10 Units Subcutaneous TID WC   [START ON 03/05/2024] insulin  glargine  30 Units Subcutaneous Daily   montelukast  10 mg Oral QHS   pantoprazole   40 mg Oral Daily   rosuvastatin   10 mg Oral Daily   umeclidinium bromide   1 puff Inhalation Daily   Continuous Infusions:   LOS: 2 days   Time spent: 80 mins   Zubair Lofton Vicci, MD How to contact the [facility] Attending or Consulting provider 7A - 7P or covering provider during after hours 7P -7A, for this  patient?  Check the care team in [facility] and look for a) attending/consulting [facility] provider listed and b) the [facility] team listed Log into [email or web address] to find provider on call.  Locate the [facility] provider you are looking for under [facility] and page to a number that you can be directly reached. If you still have difficulty reaching the provider, please page the [facility] (Director on Call) for the Hospitalists listed on amion for assistance.  03/04/2024, 3:23 PM

## 2024-03-04 NOTE — Progress Notes (Signed)
 Patient practiced administering insulin  to herself today multiple times and understands the steps needed in order to administer insulin  at home, no further questions at this time.

## 2024-03-04 NOTE — Inpatient Diabetes Management (Addendum)
 Inpatient Diabetes Program Recommendations  AACE/ADA: New Consensus Statement on Inpatient Glycemic Control (2015)  Target Ranges:  Prepandial:   less than 140 mg/dL      Peak postprandial:   less than 180 mg/dL (1-2 hours)      Critically ill patients:  140 - 180 mg/dL   Lab Results  Component Value Date   GLUCAP 224 (H) 03/04/2024   HGBA1C 8.7 (H) 01/09/2024    Review of Glycemic Control  Diabetes history: DM2 Outpatient Diabetes medications: None Current orders for Inpatient glycemic control: Lantus  26 units daily, Novolog  0-9 TID with meals + 8 units TID  CBGs 258, 224 this morning New insulin  orders this am  Inpatient Diabetes Program Recommendations:    Consider updating HgbA1C - last one 2 months ago  Spoke with pt by phone this morning about her diabetes control. States she has been on insulin  in the past but PCP took her off her OHAs and Lantus . Does not check blood sugars very often. States she has old meter and needs a new one. Reviewed hypoglycemia s/s and treatment. Instructed pt to make appt with PCP within a week or two of discharge. Take logbook for MDDiscussed importance of checking CBGs and maintaining good CBG control to prevent long-term and short-term complications. Explained how hyperglycemia leads to damage within blood vessels which lead to the common complications seen with uncontrolled diabetes. Stressed to the patient the importance of improving glycemic control to prevent further complications from uncontrolled diabetes. Discussed impact of nutrition, exercise, stress, sickness, and medications on diabetes control. to review. Pt says she drinks juice and soda, discussed other alternatives. Answered questions and pt states she is okay with going home on insulin . Will need meter prescription at discharge. Asked RN to allow pt to give her own insulin  while inpatient.  Continue to follow.  Thank you. Shona Brandy, RD, LDN, CDC[facility] Inpatient Diabetes  Coordinator [phone]

## 2024-03-04 NOTE — Plan of Care (Signed)
 Problem: Education: Goal: Knowledge of General Education information will improve Description: Including pain rating scale, medication(s)/side effects and non-pharmacologic comfort measures Outcome: Progressing   Problem: Health Behavior/Discharge Planning: Goal: Ability to manage health-related needs will improve Outcome: Progressing   Problem: Clinical Measurements: Goal: Ability to maintain clinical measurements within normal limits will improve Outcome: Progressing Goal: Will remain free from infection Outcome: Progressing Goal: Diagnostic test results will improve Outcome: Progressing Goal: Respiratory complications will improve Outcome: Progressing Goal: Cardiovascular complication will be avoided Outcome: Progressing   Problem: Activity: Goal: Risk for activity intolerance will decrease Outcome: Progressing   Problem: Nutrition: Goal: Adequate nutrition will be maintained Outcome: Progressing   Problem: Coping: Goal: Level of anxiety will decrease Outcome: Progressing   Problem: Elimination: Goal: Will not experience complications related to bowel motility Outcome: Progressing Goal: Will not experience complications related to urinary retention Outcome: Progressing   Problem: Pain Managment: Goal: General experience of comfort will improve and/or be controlled Outcome: Progressing   Problem: Safety: Goal: Ability to remain free from injury will improve Outcome: Progressing   Problem: Skin Integrity: Goal: Risk for impaired skin integrity will decrease Outcome: Progressing   Problem: Education: Goal: Ability to describe self-care measures that may prevent or decrease complications (Diabetes Survival Skills Education) will improve Outcome: Progressing Goal: Individualized Educational Video(s) Outcome: Progressing   Problem: Coping: Goal: Ability to adjust to condition or change in health will improve Outcome: Progressing   Problem: Fluid  Volume: Goal: Ability to maintain a balanced intake and output will improve Outcome: Progressing   Problem: Health Behavior/Discharge Planning: Goal: Ability to identify and utilize available resources and services will improve Outcome: Progressing Goal: Ability to manage health-related needs will improve Outcome: Progressing   Problem: Metabolic: Goal: Ability to maintain appropriate glucose levels will improve Outcome: Progressing   Problem: Nutritional: Goal: Maintenance of adequate nutrition will improve Outcome: Progressing Goal: Progress toward achieving an optimal weight will improve Outcome: Progressing   Problem: Skin Integrity: Goal: Risk for impaired skin integrity will decrease Outcome: Progressing   Problem: Tissue Perfusion: Goal: Adequacy of tissue perfusion will improve Outcome: Progressing   Problem: Education: Goal: Ability to describe self-care measures that may prevent or decrease complications (Diabetes Survival Skills Education) will improve Outcome: Progressing Goal: Individualized Educational Video(s) Outcome: Progressing   Problem: Cardiac: Goal: Ability to maintain an adequate cardiac output will improve Outcome: Progressing   Problem: Health Behavior/Discharge Planning: Goal: Ability to identify and utilize available resources and services will improve Outcome: Progressing Goal: Ability to manage health-related needs will improve Outcome: Progressing   Problem: Fluid Volume: Goal: Ability to achieve a balanced intake and output will improve Outcome: Progressing   Problem: Metabolic: Goal: Ability to maintain appropriate glucose levels will improve Outcome: Progressing   Problem: Nutritional: Goal: Maintenance of adequate nutrition will improve Outcome: Progressing Goal: Maintenance of adequate weight for body size and type will improve Outcome: Progressing   Problem: Respiratory: Goal: Will regain and/or maintain adequate  ventilation Outcome: Progressing   Problem: Urinary Elimination: Goal: Ability to achieve and maintain adequate renal perfusion and functioning will improve Outcome: Progressing   Problem: Education: Goal: Knowledge of disease or condition will improve Outcome: Progressing Goal: Knowledge of the prescribed therapeutic regimen will improve Outcome: Progressing Goal: Individualized Educational Video(s) Outcome: Progressing   Problem: Activity: Goal: Ability to tolerate increased activity will improve Outcome: Progressing Goal: Will verbalize the importance of balancing activity with adequate rest periods Outcome: Progressing   Problem: Respiratory: Goal: Ability to maintain  a clear airway will improve Outcome: Progressing Goal: Levels of oxygenation will improve Outcome: Progressing Goal: Ability to maintain adequate ventilation will improve Outcome: Progressing

## 2024-03-04 NOTE — Plan of Care (Signed)
   Problem: Education: Goal: Knowledge of General Education information will improve Description: Including pain rating scale, medication(s)/side effects and non-pharmacologic comfort measures Outcome: Progressing   Problem: Clinical Measurements: Goal: Ability to maintain clinical measurements within normal limits will improve Outcome: Progressing Goal: Respiratory complications will improve Outcome: Progressing Goal: Cardiovascular complication will be avoided Outcome: Progressing   Problem: Activity: Goal: Risk for activity intolerance will decrease Outcome: Progressing   Problem: Nutrition: Goal: Adequate nutrition will be maintained Outcome: Progressing

## 2024-03-05 ENCOUNTER — Inpatient Hospital Stay (HOSPITAL_COMMUNITY)

## 2024-03-05 ENCOUNTER — Other Ambulatory Visit (HOSPITAL_COMMUNITY): Payer: Self-pay | Admitting: *Deleted

## 2024-03-05 DIAGNOSIS — R079 Chest pain, unspecified: Secondary | ICD-10-CM | POA: Diagnosis not present

## 2024-03-05 DIAGNOSIS — I25119 Atherosclerotic heart disease of native coronary artery with unspecified angina pectoris: Secondary | ICD-10-CM | POA: Diagnosis not present

## 2024-03-05 DIAGNOSIS — R0789 Other chest pain: Secondary | ICD-10-CM

## 2024-03-05 DIAGNOSIS — E782 Mixed hyperlipidemia: Secondary | ICD-10-CM | POA: Diagnosis not present

## 2024-03-05 DIAGNOSIS — I10 Essential (primary) hypertension: Secondary | ICD-10-CM | POA: Diagnosis not present

## 2024-03-05 DIAGNOSIS — E1100 Type 2 diabetes mellitus with hyperosmolarity without nonketotic hyperglycemic-hyperosmolar coma (NKHHC): Secondary | ICD-10-CM | POA: Diagnosis not present

## 2024-03-05 LAB — GLUCOSE, CAPILLARY
Glucose-Capillary: 127 mg/dL — ABNORMAL HIGH (ref 70–99)
Glucose-Capillary: 156 mg/dL — ABNORMAL HIGH (ref 70–99)
Glucose-Capillary: 235 mg/dL — ABNORMAL HIGH (ref 70–99)
Glucose-Capillary: 220 mg/dL — ABNORMAL HIGH (ref 70–99)
Glucose-Capillary: 167 mg/dL — ABNORMAL HIGH (ref 70–99)

## 2024-03-05 LAB — BASIC METABOLIC PANEL WITH GFR
Anion gap: 11 (ref 5–15)
BUN: 23 mg/dL (ref 8–23)
CO2: 24 mmol/L (ref 22–32)
Calcium: 10.4 mg/dL — ABNORMAL HIGH (ref 8.9–10.3)
Chloride: 99 mmol/L (ref 98–111)
Creatinine, Ser: 1.58 mg/dL — ABNORMAL HIGH (ref 0.44–1.00)
GFR, Estimated: 34 mL/min — ABNORMAL LOW (ref >60)
Glucose, Bld: 125 mg/dL — ABNORMAL HIGH (ref 70–99)
Potassium: 3.5 mmol/L (ref 3.5–5.1)
Sodium: 135 mmol/L (ref 135–145)

## 2024-03-05 LAB — ECHOCARDIOGRAM LIMITED
Calc EF: 31.4 %
Height: 64 in
S' Lateral: 2.6 cm
Single Plane A2C EF: 29.5 %
Single Plane A4C EF: 28.7 %
Weight: 3326.3 [oz_av]

## 2024-03-05 LAB — MAGNESIUM: Magnesium: 1.9 mg/dL (ref 1.7–2.4)

## 2024-03-05 LAB — TROPONIN T, HIGH SENSITIVITY: Troponin T High Sensitivity: 127 ng/L (ref 0–19)

## 2024-03-05 MED ORDER — PERFLUTREN LIPID MICROSPHERE
1.0000 mL | INTRAVENOUS | Status: AC | PRN
  Administered 2024-03-05: 3 mL via INTRAVENOUS

## 2024-03-05 MED ORDER — METOPROLOL SUCCINATE ER 25 MG PO TB24
25.0000 mg | ORAL_TABLET | Freq: Every day | ORAL | Status: AC
Start: 2024-03-05 — End: ?
  Administered 2024-03-05 – 2024-03-09 (×5): 25 mg via ORAL
  Filled 2024-03-05 (×6): qty 1

## 2024-03-05 MED ORDER — EZETIMIBE 10 MG PO TABS
10.0000 mg | ORAL_TABLET | Freq: Every day | ORAL | Status: DC
  Administered 2024-03-05 – 2024-03-08 (×4): 10 mg via ORAL
  Filled 2024-03-05 (×4): qty 1

## 2024-03-05 MED ORDER — LIVING WELL WITH DIABETES BOOK: Freq: Once | Status: AC

## 2024-03-05 MED ORDER — SODIUM CHLORIDE 0.9 % WEIGHT BASED INFUSION: 3.0000 mL/kg/h | INTRAVENOUS | Status: DC

## 2024-03-05 MED ORDER — SODIUM CHLORIDE 0.9 % WEIGHT BASED INFUSION: 1.0000 mL/kg/h | INTRAVENOUS | Status: DC

## 2024-03-05 MED ORDER — INSULIN GLARGINE 100 UNIT/ML ~~LOC~~ SOLN
34.0000 [IU] | Freq: Every day | SUBCUTANEOUS | Status: DC
  Administered 2024-03-06 – 2024-03-07 (×2): 34 [IU] via SUBCUTANEOUS
  Filled 2024-03-05 (×5): qty 0.34

## 2024-03-05 MED ORDER — SENNOSIDES-DOCUSATE SODIUM 8.6-50 MG PO TABS
2.0000 | ORAL_TABLET | Freq: Every day | ORAL | Status: DC
  Administered 2024-03-05 – 2024-03-07 (×3): 2 via ORAL
  Filled 2024-03-05 (×4): qty 2

## 2024-03-05 MED ORDER — INSULIN ASPART 100 UNIT/ML IJ SOLN
12.0000 [IU] | Freq: Three times a day (TID) | INTRAMUSCULAR | Status: DC
  Administered 2024-03-05 – 2024-03-08 (×6): 12 [IU] via SUBCUTANEOUS

## 2024-03-05 MED ORDER — INSULIN STARTER KIT- PEN NEEDLES (ENGLISH)
1.0000 | Freq: Once | Status: AC
  Administered 2024-03-05: 1
  Filled 2024-03-05: qty 1

## 2024-03-05 NOTE — Inpatient Diabetes Management (Signed)
 Inpatient Diabetes Program Recommendations  AACE/ADA: New Consensus Statement on Inpatient Glycemic Control   Target Ranges:  Prepandial:   less than 140 mg/dL      Peak postprandial:   less than 180 mg/dL (1-2 hours)      Critically ill patients:  140 - 180 mg/dL    Latest Reference Range & Units 03/05/24 03:18 03/05/24 07:34 03/05/24 11:29  Glucose-Capillary 70 - 99 mg/dL 872 (H) 843 (H) 764 (H)    Latest Reference Range & Units 03/04/24 07:13 03/04/24 11:30 03/04/24 16:23 03/04/24 19:34  Glucose-Capillary 70 - 99 mg/dL 775 (H) 705 (H) 795 (H) 229 (H)    Review of Glycemic Control  Diabetes history: DM2 Outpatient Diabetes medications: Actos 15 mg daily Current orders for Inpatient glycemic control: Lantus  34 units QHS, Novoog 0-20 units TID with meals, Novolog  0-5 units at bedtime, Novolog  10 units TID with meals  Inpatient Diabetes Program Recommendations:    Discharge Recommendations:  Long acting recommendations: Insulin  Glargine (LANTUS ) Solostar Pen  dose to be determined at  Supply/Referral recommendations: Glucometer Test strips Lancet device Lancets Pen needles - standard Use Adult Diabetes Insulin  Treatment Post Discharge order set.   NOTE: Spoke with patient at bedside about diabetes and home regimen for diabetes control. Patient reports she had a significant hypoglycemic event about 2 months ago and she was told to stop taking Lantus  and Metformin .  So she has only been taking Actos for DM as an outpatient. Patient reports that she sees PCP at [facility] [place] [facility] [place]. Patient reports she still has 1 box of 5 Lantus  insulin  pens at home. Patient states she does not want to take Metformin  due to GI upset (added in chart as allergy).  Patient states she checks her glucose several times a day and she has not had any issues with hypoglycemia since stopping insulin .    Discussed A1C results (10.9% on 03/04/24) and explained that current A1C indicates an average glucose of 266  mg/dl over the past 2-3 months. Discussed glucose and A1C goals. Discussed that she will likely need to resume insulin  as an outpatient and she will need close follow up so insulin  could be adjusted if needed. Patient is agreeable to restart insulin  as an outpatient.  Patient verbalized understanding of information discussed and reports no further questions at this time related to diabetes.  Thanks, Earnie Gainer, RN, MSN, CDE Diabetes Coordinator Inpatient Diabetes Program [phone] (Team Pager)

## 2024-03-05 NOTE — Plan of Care (Signed)
 Problem: Education: Goal: Knowledge of General Education information will improve Description: Including pain rating scale, medication(s)/side effects and non-pharmacologic comfort measures Outcome: Progressing   Problem: Health Behavior/Discharge Planning: Goal: Ability to manage health-related needs will improve Outcome: Progressing   Problem: Clinical Measurements: Goal: Ability to maintain clinical measurements within normal limits will improve Outcome: Progressing Goal: Will remain free from infection Outcome: Progressing Goal: Diagnostic test results will improve Outcome: Progressing Goal: Respiratory complications will improve Outcome: Progressing Goal: Cardiovascular complication will be avoided Outcome: Progressing   Problem: Activity: Goal: Risk for activity intolerance will decrease Outcome: Progressing   Problem: Nutrition: Goal: Adequate nutrition will be maintained Outcome: Progressing   Problem: Coping: Goal: Level of anxiety will decrease Outcome: Progressing   Problem: Elimination: Goal: Will not experience complications related to bowel motility Outcome: Progressing Goal: Will not experience complications related to urinary retention Outcome: Progressing   Problem: Pain Managment: Goal: General experience of comfort will improve and/or be controlled Outcome: Progressing   Problem: Safety: Goal: Ability to remain free from injury will improve Outcome: Progressing   Problem: Skin Integrity: Goal: Risk for impaired skin integrity will decrease Outcome: Progressing   Problem: Education: Goal: Ability to describe self-care measures that may prevent or decrease complications (Diabetes Survival Skills Education) will improve Outcome: Progressing Goal: Individualized Educational Video(s) Outcome: Progressing   Problem: Coping: Goal: Ability to adjust to condition or change in health will improve Outcome: Progressing   Problem: Fluid  Volume: Goal: Ability to maintain a balanced intake and output will improve Outcome: Progressing   Problem: Health Behavior/Discharge Planning: Goal: Ability to identify and utilize available resources and services will improve Outcome: Progressing Goal: Ability to manage health-related needs will improve Outcome: Progressing   Problem: Metabolic: Goal: Ability to maintain appropriate glucose levels will improve Outcome: Progressing   Problem: Nutritional: Goal: Maintenance of adequate nutrition will improve Outcome: Progressing Goal: Progress toward achieving an optimal weight will improve Outcome: Progressing   Problem: Skin Integrity: Goal: Risk for impaired skin integrity will decrease Outcome: Progressing   Problem: Tissue Perfusion: Goal: Adequacy of tissue perfusion will improve Outcome: Progressing   Problem: Education: Goal: Ability to describe self-care measures that may prevent or decrease complications (Diabetes Survival Skills Education) will improve Outcome: Progressing Goal: Individualized Educational Video(s) Outcome: Progressing   Problem: Cardiac: Goal: Ability to maintain an adequate cardiac output will improve Outcome: Progressing   Problem: Health Behavior/Discharge Planning: Goal: Ability to identify and utilize available resources and services will improve Outcome: Progressing Goal: Ability to manage health-related needs will improve Outcome: Progressing   Problem: Fluid Volume: Goal: Ability to achieve a balanced intake and output will improve Outcome: Progressing   Problem: Metabolic: Goal: Ability to maintain appropriate glucose levels will improve Outcome: Progressing   Problem: Nutritional: Goal: Maintenance of adequate nutrition will improve Outcome: Progressing Goal: Maintenance of adequate weight for body size and type will improve Outcome: Progressing   Problem: Respiratory: Goal: Will regain and/or maintain adequate  ventilation Outcome: Progressing   Problem: Urinary Elimination: Goal: Ability to achieve and maintain adequate renal perfusion and functioning will improve Outcome: Progressing   Problem: Education: Goal: Knowledge of disease or condition will improve Outcome: Progressing Goal: Knowledge of the prescribed therapeutic regimen will improve Outcome: Progressing Goal: Individualized Educational Video(s) Outcome: Progressing   Problem: Activity: Goal: Ability to tolerate increased activity will improve Outcome: Progressing Goal: Will verbalize the importance of balancing activity with adequate rest periods Outcome: Progressing   Problem: Respiratory: Goal: Ability to maintain  a clear airway will improve Outcome: Progressing Goal: Levels of oxygenation will improve Outcome: Progressing Goal: Ability to maintain adequate ventilation will improve Outcome: Progressing

## 2024-03-05 NOTE — Progress Notes (Signed)
*  PRELIMINARY RESULTS* Echocardiogram Limited 2-D Echocardiogram has been performed with Definity.  Katelyn Kaufman 03/05/2024, 2:44 PM

## 2024-03-05 NOTE — Progress Notes (Signed)
 PROGRESS NOTE   Katelyn Kaufman  FMW:[id number] DOB: December 16, 1950 DOA: 03/02/2024 PCP: [facility]   Level of care: Telemetry  Brief Admission History:  73 y.o. female,  with medical history significant of anxiety, coronary artery disease, COPD, depression, hypertension, hyperlipidemia, OSA, type 2 diabetes mellitus. - Presents to ED secondary to complaints of nausea, vomiting, abdominal pain, cough and congestion, reports upper respiratory symptoms going on for last 2 weeks, denies any sick contact, reports subjective fever and chills at home, she reports abdominal pain, nausea and vomiting as well, has been going on for last 5 days, patient reports she is not on any insulin  anymore, not sure if she is in any oral hypoglycemic agents or not. -NAD CT abdomen pelvis with no acute findings, chest x-ray with no acute findings, COVID, RSV and influenza are negative, workup significant for low sodium at 116, but once corrected to per glycemia of 653 it is 125, creatinine up from 1.9 last week to 3.16, white blood cell count elevated at 11.8, lactic acid elevated at 3, UA significant for glucose urea but no ketonuria,.  [facility] hospitalist consulted to admit   Assessment and Plan:  HHS - TREATED  - Patient poor historian, but she is sure she is not on any insulin , unsure if she has any oral diabetes medicine or not - transitioned off IV insulin  to subcutaneous insulin  - increasing glargine to 34 units, novolog  12 units TID with meals, aggressive SSI coverage and continue frequent CBG monitoring.  - advance diet to carb modified  CBG (last 3)  Recent Labs    03/05/24 0318 03/05/24 0734 03/05/24 1129  GLUCAP 127* 156* 235*    Hyponatremia - improving  Chest Pain - pt presented early this morning with an acute chest pain episode and having recurrent chest pain - HS troponins have been flat and ACS ruled out  - check portable CXR -- no acute findings  - given recurrent CP symptoms  will ask for cardiology consult available tomorrow morning - IV famotidine  and PPI ordered to treat for possible GI causes  - TTE ordered by cardiology team     Abdominal pain, nausea, vomiting cough, Congestion-- URI -Finding most likely related to acute viral illness COVID, RSV and influenza negative -Will check respiratory panel, she has significant cough, congestion, encouraged use incentive spirometry, flutter valve, will start on Mucinex - Chest x-ray with no acute findings-CT abdomen pelvis with no acute findings - checked procalcitonin and was reassuring and therefore will stop antibiotics    Prolonged QTc - Monitor on telemetry and avoid prolonging agents, potassium more than 4, but will correct as anticipate it will drop, magnesium  more than 2  Hypercalcemia - pt being worked up by her nephrologist Dr. 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 Rachele    Lactic acidosis - Likely due to volume depletion, trended down with IV fluids    Coronary artery disease - Continue aspirin , resume Crestor  and metoprolol  when blood pressure is stable and able to take oral -- cardiology consult due to recurrent complaints of chest pain    HTN- -pressure is soft on presentation, continue to hold meds  Acute kidney injury on chronic kidney disease stage IIIb - Volume depletion and dehydration, continue with IV fluids and avoid nephrotoxic medications   Depression/Anxiety -Hold Cymbalta  due to prolonged QTc   Chronic pain syndrome -Subutex , resume once stable, both on hold  for prolonged QTc, resume gabapentin  when able to take oral    History of left-sided breast cancer -Continue Arimidex   DVT prophylaxis: sq heparin  Code Status: Full  Family Communication:  Disposition: anticipate return home when cleared by cardiology    Consultants:   Procedures:   Antimicrobials:    Subjective: Pt says  overall she is starting to feel a lot better, no chest pain today.  She wants to ambulate more.    Objective: Vitals:   03/04/24 1932 03/05/24 0317 03/05/24 0838 03/05/24 1247  BP: 124/71 108/72  120/72  Pulse: 92 87  87  Resp: 17 18  18   Temp: 98.3 F (36.8 C) 99.5 F (37.5 C)  97.9 F (36.6 C)  TempSrc: Oral Oral  Oral  SpO2: 98% 96% 95% 95%  Weight:      Height:        Intake/Output Summary (Last 24 hours) at 03/05/2024 1505 Last data filed at 03/05/2024 0231 Gross per 24 hour  Intake 42.7 ml  Output 1300 ml  Net -1257.3 ml   Filed Weights   03/02/24 1800 03/02/24 2145 03/03/24 0500  Weight: 89.1 kg 91.2 kg 94.3 kg   Examination:  General exam: Appears calm and comfortable  Respiratory system: no increased work of breathing.  Cardiovascular system: normal S1 & S2 heard. No JVD, murmurs, rubs, gallops or clicks. No pedal edema. Gastrointestinal system: Abdomen is nondistended, soft and nontender. No organomegaly or masses felt. Normal bowel sounds heard. Central nervous system: Alert and oriented. No focal neurological deficits. Extremities: Symmetric 5 x 5 power. Skin: No rashes, lesions or ulcers. Psychiatry: Judgement and insight appear normal. Mood & affect appropriate.   Data Reviewed: I have personally reviewed following labs and imaging studies  CBC: Recent Labs  Lab 03/02/24 1830 03/03/24 0255 03/04/24 0018  WBC 11.8* 13.3* 9.5  NEUTROABS 8.7*  --  5.6  HGB 15.2* 14.9 13.0  HCT 43.0 43.7 37.5  MCV 91.3 93.6 92.8  PLT 257 231 221    Basic Metabolic Panel: Recent Labs  Lab 03/02/24 1830 03/02/24 2213 03/03/24 0255 03/04/24 0018 03/05/24 0354  NA 116* 131* 129* 130* 135  K 4.4 3.4* 3.5 4.0 3.5  CL 80* 91* 92* 96* 99  CO2 20* 24 23 24 24   GLUCOSE 653* 197* 146* 236* 125*  BUN 47* 44* 40* 29* 23  CREATININE 3.16* 2.78* 2.44* 1.87* 1.58*  CALCIUM  11.6* 11.2* 10.7* 10.2 10.4*  MG 2.6*  --   --  1.9 1.9    CBG: Recent Labs  Lab  03/04/24 1623 03/04/24 1934 03/05/24 0318 03/05/24 0734 03/05/24 1129  GLUCAP 204* 229* 127* 156* 235*    Recent Results (from the past 240 hours)  Resp panel by RT-PCR (RSV, Flu A&B, Covid) Anterior Nasal Swab     Status: None   Collection Time: 03/02/24  7:33 PM   Specimen: Anterior Nasal Swab  Result Value Ref Range Status   SARS Coronavirus 2 by RT PCR NEGATIVE NEGATIVE Final    Comment: (NOTE) SARS-CoV-2 target nucleic acids are NOT DETECTED.  The SARS-CoV-2 RNA is generally detectable in upper respiratory specimens during the acute phase of infection. The lowest concentration of SARS-CoV-2 viral copies this assay can detect is 138 copies/mL. A negative result does not preclude SARS-Cov-2 infection and should not be used as the sole basis for treatment or other patient management decisions. A negative result may occur with  improper specimen collection/handling, submission of specimen other than nasopharyngeal swab, presence  of viral mutation(s) within the areas targeted by this assay, and inadequate number of viral copies(<138 copies/mL). A negative result must be combined with clinical observations, patient history, and epidemiological information. The expected result is Negative.  Fact Sheet for Patients:  BloggerCourse.com  Fact Sheet for Healthcare Providers:  SeriousBroker.it  This test is no t yet approved or cleared by the [place]  FDA and  has been authorized for detection and/or diagnosis of SARS-CoV-2 by FDA under an Emergency Use Authorization (EUA). This EUA will remain  in effect (meaning this test can be used) for the duration of the COVID-19 declaration under Section 564(b)(1) of the Act, 21 U.S.C.section 360bbb-3(b)(1), unless the authorization is terminated  or revoked sooner.       Influenza A by PCR NEGATIVE NEGATIVE Final   Influenza B by PCR NEGATIVE NEGATIVE Final    Comment: (NOTE) The  Xpert Xpress SARS-CoV-2/FLU/RSV plus assay is intended as an aid in the diagnosis of influenza from Nasopharyngeal swab specimens and should not be used as a sole basis for treatment. Nasal washings and aspirates are unacceptable for Xpert Xpress SARS-CoV-2/FLU/RSV testing.  Fact Sheet for Patients: BloggerCourse.com  Fact Sheet for Healthcare Providers: SeriousBroker.it  This test is not yet approved or cleared by the [place]  FDA and has been authorized for detection and/or diagnosis of SARS-CoV-2 by FDA under an Emergency Use Authorization (EUA). This EUA will remain in effect (meaning this test can be used) for the duration of the COVID-19 declaration under Section 564(b)(1) of the Act, 21 U.S.C. section 360bbb-3(b)(1), unless the authorization is terminated or revoked.     Resp Syncytial Virus by PCR NEGATIVE NEGATIVE Final    Comment: (NOTE) Fact Sheet for Patients: BloggerCourse.com  Fact Sheet for Healthcare Providers: SeriousBroker.it  This test is not yet approved or cleared by the [place]  FDA and has been authorized for detection and/or diagnosis of SARS-CoV-2 by FDA under an Emergency Use Authorization (EUA). This EUA will remain in effect (meaning this test can be used) for the duration of the COVID-19 declaration under Section 564(b)(1) of the Act, 21 U.S.C. section 360bbb-3(b)(1), unless the authorization is terminated or revoked.  Performed at [facility], [address], [place], [place] [address]   Urine Culture     Status: Abnormal   Collection Time: 03/02/24  8:00 PM   Specimen: Urine, Random  Result Value Ref Range Status   Specimen Description   Final    URINE, RANDOM Performed at [facility], [address] [place], [place] [address]    Special Requests   Final    NONE Reflexed from [id number] Performed at [facility], [address]  [place], [place] [address]    Culture MULTIPLE SPECIES PRESENT, SUGGEST RECOLLECTION (A)  Final   Report Status 03/04/2024 FINAL  Final  MRSA Next Gen by PCR, Nasal     Status: None   Collection Time: 03/02/24 10:00 PM   Specimen: Nasal Mucosa; Nasal Swab  Result Value Ref Range Status   MRSA by PCR Next Gen NOT DETECTED NOT DETECTED Final    Comment: (NOTE) The GeneXpert MRSA Assay (FDA approved for NASAL specimens only), is one component of a comprehensive MRSA colonization surveillance program. It is not intended to diagnose MRSA infection nor to guide or monitor treatment for MRSA infections. Test performance is not FDA approved in patients less than 17 years old. Performed at [facility], [address] [place], [place] [address]   Respiratory (~20 pathogens) panel by  PCR     Status: None   Collection Time: 03/02/24 10:20 PM   Specimen: Nasopharyngeal Swab; Respiratory  Result Value Ref Range Status   Adenovirus NOT DETECTED NOT DETECTED Final   Coronavirus 229E NOT DETECTED NOT DETECTED Final    Comment: (NOTE) The Coronavirus on the Respiratory Panel, DOES NOT test for the novel  Coronavirus (2019 nCoV)    Coronavirus HKU1 NOT DETECTED NOT DETECTED Final   Coronavirus NL63 NOT DETECTED NOT DETECTED Final   Coronavirus OC43 NOT DETECTED NOT DETECTED Final   Metapneumovirus NOT DETECTED NOT DETECTED Final   Rhinovirus / Enterovirus NOT DETECTED NOT DETECTED Final   Influenza A NOT DETECTED NOT DETECTED Final   Influenza B NOT DETECTED NOT DETECTED Final   Parainfluenza Virus 1 NOT DETECTED NOT DETECTED Final   Parainfluenza Virus 2 NOT DETECTED NOT DETECTED Final   Parainfluenza Virus 3 NOT DETECTED NOT DETECTED Final   Parainfluenza Virus 4 NOT DETECTED NOT DETECTED Final   Respiratory Syncytial Virus NOT DETECTED NOT DETECTED Final   Bordetella pertussis NOT DETECTED NOT DETECTED Final   Bordetella Parapertussis NOT DETECTED NOT DETECTED Final   Chlamydophila pneumoniae NOT  DETECTED NOT DETECTED Final   Mycoplasma pneumoniae NOT DETECTED NOT DETECTED Final    Comment: Performed at [facility], [address] [place], [place] [address]     Radiology Studies: DG CHEST PORT 1 VIEW Result Date: 03/04/2024 CLINICAL DATA:  355200 Chest pain 355200 EXAM: PORTABLE CHEST 1 VIEW COMPARISON:  March 02, 2024, June 27, 2023 FINDINGS: The cardiomediastinal silhouette is unchanged in contour.Status post median sternotomy. Atherosclerotic calcifications. No pleural effusion. No pneumothorax. No acute pleuroparenchymal abnormality. IMPRESSION: No acute cardiopulmonary abnormality. Electronically Signed   By: Corean Salter M.D.   On: 03/04/2024 10:21   Scheduled Meds:  amLODipine   5 mg Oral Daily   anastrozole   1 mg Oral Daily   aspirin  EC  81 mg Oral Daily   buPROPion  150 mg Oral q morning   Chlorhexidine  Gluconate Cloth  6 each Topical Daily   doxycycline  100 mg Oral Q12H   DULoxetine   60 mg Oral Daily   ezetimibe   10 mg Oral Daily   gabapentin   200 mg Oral TID   guaiFENesin  1,200 mg Oral BID   heparin   5,000 Units Subcutaneous Q8H   insulin  aspart  0-20 Units Subcutaneous TID WC   insulin  aspart  0-5 Units Subcutaneous QHS   insulin  aspart  12 Units Subcutaneous TID WC   [START ON 03/06/2024] insulin  glargine  34 Units Subcutaneous Daily   montelukast  10 mg Oral QHS   pantoprazole   40 mg Oral Daily   rosuvastatin   10 mg Oral Daily   umeclidinium bromide   1 puff Inhalation Daily   Continuous Infusions:   LOS: 3 days   Time spent: 52 mins   Manda Holstad Vicci, MD How to contact the [facility] Attending or Consulting provider 7A - 7P or covering provider during after hours 7P -7A, for this patient?  Check the care team in [facility] and look for a) attending/consulting [facility] provider listed and b) the [facility] team listed Log into [email or web address] to find provider on call.  Locate the [facility] provider you are looking for under [facility] and page to a number that  you can be directly reached. If you still have difficulty reaching the provider, please page the [facility] (Director on Call) for the Hospitalists listed on amion for assistance.  03/05/2024, 3:05 PM

## 2024-03-05 NOTE — Plan of Care (Signed)

## 2024-03-05 NOTE — Consult Note (Addendum)
 Cardiology Consultation   Patient ID: CARRINGTON MULLENAX MRN: [id number]; DOB: 1951-04-11  Admit date: 03/02/2024 Date of Consult: 03/05/2024  PCP:  [facility]   [facility] Providers Cardiologist:  None      Patient Profile: K[facility]n [facility] is a 73 y.o. female with a hx of CAD (s/p CABG in 10/2021 LIMA to LAD, SVG to OM, and SVG to ramus intermedius), HTN, HLD, OSA, DM2, breast cancer s/p L mastectomy, depression/anxiety, chronic pain syndrome, tobacco abuse  who is being seen 03/05/2024 for the evaluation of chest pain at the request of Dr. Vicci.  History of Present Illness: [name] last seen in heart care 11/10/2023 by Almarie Crate, NP for hospital follow-up on chest pain.  At that time doing well without any complaints or recurrent CP.  Continued on amlodipine  5 mg daily, ASA 81 mg daily, Zetia  10 mg daily, Lasix  60 mg daily, Imdur  30 mg daily, losartan  100 mg daily, Lopressor  25 mg twice daily, NTG as needed, KCl 20 mEq daily, Crestor  20 mg daily.  Presented to [facility] ED 10/10 for N/B, abdominal pain, cough, congestion. Suspect most likely secondary to acute viral illness.  Initial labs > most recent: NA 116 (corrected per glycemia 125) >135, glucose 653>125, K4.4>3.5, MG 2.6> 1.9, CR 3.16> 1.58, Lactic acid 3 >2.6, WBC 11.8 >9.5 with neutrophils 8.7 >5.6, Hgb 15.2 > 13, negative viral respiratory panel CXR with no acute findings. On 10/11 patient notified staff of chest pain. TN 181 >173 EKG: NSR, HR 91, newly inverted T waves in lateral leads.   Treated with IV famotidine  and PPI for possible GI cause.  On interview, patient reports intermittent chest pain, lasting 2 to 3 minutes, 4/10, located midsternal, described as throbbing with no radiation or associated symptoms.  Notes this episode is similar to previous CP and less severe than CP associated with previous CABG.  Patient suspects this episode of chest pain was related to anxiety.  Patient has history  of exertional chest pain approximately 2 times per week associated with exertion that is mildly relieved with NTG. Also reports chronic unchanged SOB with minimal exertion and relieved with rest as well as palpitations associated with stress described as racing lasting 10 to 15 minutes.  Patient smokes 6 to 7 cigarettes/day.  Denies any EtOH or drugs.  Patient reports daily medication compliance.   Past Medical History:  Diagnosis Date   Allergy    Anxiety    Arthritis    Breast cancer (HCC)    CAD (coronary artery disease) 2018   Severe trifurcation disease involving left main, ostial circumflex, 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   COPD (chronic obstructive pulmonary disease) (HCC)    Depression    Essential hypertension    Hyperlipidemia    Myocardial infarction (HCC)    [place] to the LAD   OSA (obstructive sleep apnea)    CPAP qHS   PONV (postoperative nausea and vomiting)    Type 2 diabetes mellitus (HCC)     Past Surgical History:  Procedure Laterality Date   BACK SURGERY     BREAST SURGERY     CATARACT EXTRACTION W/PHACO Right 10/24/2020   Procedure: CATARACT EXTRACTION PHACO AND INTRAOCULAR LENS PLACEMENT (IOC);  Surgeon: Harrie Agent, MD;  Location: [facility];  Service: Ophthalmology;  Laterality: Right;  CDE: 10.87   CATARACT EXTRACTION W/PHACO Left 12/01/2020   Procedure: CATARACT EXTRACTION  PHACO AND INTRAOCULAR LENS PLACEMENT LEFT EYE;  Surgeon: Harrie Agent, MD;  Location: [facility];  Service: Ophthalmology;  Laterality: Left;  CDE  8.62   CORONARY ARTERY BYPASS GRAFT N/A 11/16/2021   Procedure: CORONARY ARTERY BYPASS GRAFTING (CABG) times three using the left internal mammary and right saphenous vein.;  Surgeon: Lucas Dorise POUR, MD;  Location: [facility] OR;  Service: Open Heart Surgery;  Laterality: N/A;   IABP INSERTION N/A 11/16/2021   Procedure: IABP Insertion;  Surgeon: Anner Alm ORN, MD;   Location: [facility] INVASIVE CV LAB;  Service: Cardiovascular;  Laterality: N/A;   LEFT HEART CATH AND CORONARY ANGIOGRAPHY N/A 11/16/2021   Procedure: LEFT HEART CATH AND CORONARY ANGIOGRAPHY;  Surgeon: Anner Alm ORN, MD;  Location: [facility] INVASIVE CV LAB;  Service: Cardiovascular;  Laterality: N/A;   REPLACEMENT TOTAL KNEE Right    SIMPLE MASTECTOMY WITH AXILLARY SENTINEL NODE BIOPSY Left 06/25/2022   Procedure: SIMPLE MASTECTOMY;  Surgeon: Mavis Anes, MD;  Location: [facility];  Service: General;  Laterality: Left;   TEE WITHOUT CARDIOVERSION N/A 11/16/2021   Procedure: TRANSESOPHAGEAL ECHOCARDIOGRAM (TEE);  Surgeon: Lucas Dorise POUR, MD;  Location: [facility] OR;  Service: Open Heart Surgery;  Laterality: N/A;   TUBAL LIGATION     WOUND EXPLORATION Left 07/26/2022   Procedure: EVACUATION OF HEMATOMA BREAST, DEBRIDEMENT OF LEFT BREAST WOUND;  Surgeon: Mavis Anes, MD;  Location: [facility];  Service: General;  Laterality: Left;     Home Medications:  Prior to Admission medications   Medication Sig Start Date End Date Taking? Authorizing Provider  acetaminophen  (TYLENOL ) 325 MG tablet Take 2 tablets (650 mg total) by mouth every 6 (six) hours as needed for mild pain (or Fever >/= 101). 07/30/22  Yes Emokpae, Courage, MD  amLODipine  (NORVASC ) 5 MG tablet Take 1 tablet (5 mg total) by mouth daily. 10/27/23  Yes Verdene Purchase, MD  anastrozole  (ARIMIDEX ) 1 MG tablet TAKE 1 TABLET(1 MG) BY MOUTH DAILY 06/06/23  Yes Rogers Hai, MD  aspirin  EC 81 MG tablet Take 1 tablet (81 mg total) by mouth daily. Swallow whole. 11/23/21  Yes Zimmerman, Donielle M, PA-C  buPROPion (WELLBUTRIN XL) 150 MG 24 hr tablet Take 150 mg by mouth every morning. 02/03/24 02/02/25 Yes [provider]  celecoxib  (CELEBREX ) 50 MG capsule TAKE 1 CAPSULE(50 MG) BY MOUTH TWICE DAILY AS NEEDED FOR PAIN 01/24/24  Yes Onita Duos, MD  cholecalciferol  (VITAMIN D3) 25 MCG (1000 UNIT) tablet Take 1,000 Units by mouth daily.   Yes [provider]  Dextromethorphan-guaiFENesin (ROBITUSSIN COUGH+CHEST CONG DM) 5-100 MG/5ML LIQD Take 10 mLs by mouth every 4 (four) hours as needed (for cough). 02/28/24  Yes [provider]  DULoxetine  (CYMBALTA ) 60 MG capsule Take 60 mg by mouth daily.   Yes [provider]  fexofenadine (ALLEGRA) 180 MG tablet Take 180 mg by mouth daily.   Yes [provider]  furosemide  (LASIX ) 40 MG tablet TAKE 1 AND 1/2 TABLETS(60 MG) BY MOUTH DAILY 11/03/23  Yes Debera Jayson MATSU, MD  gabapentin  (NEURONTIN ) 300 MG capsule Take 300 mg by mouth 3 (three) times daily. 01/06/24  Yes [provider]  glimepiride  (AMARYL ) 4 MG tablet Take 4 mg by mouth in the morning and at bedtime.   Yes [provider]  hydrochlorothiazide (HYDRODIURIL) 25 MG tablet Take 25 mg by mouth. 01/24/24  Yes [provider]  losartan  (COZAAR ) 100 MG tablet Take 100 mg by mouth daily. 11/03/23  Yes [provider]  montelukast (  SINGULAIR) 10 MG tablet Take 10 mg by mouth at bedtime. 02/03/24 02/02/25 Yes [provider]  Multiple Vitamin (MULTI-VITAMIN) tablet Take 1 tablet by mouth daily.   Yes [provider]  nitroGLYCERIN  (NITROSTAT ) 0.4 MG SL tablet Place 1 tablet (0.4 mg total) under the tongue every 5 (five) minutes x 3 doses as needed for chest pain (if no relief after 3rd dose, proceed to ED or call 911). 07/08/22  Yes Debera Jayson MATSU, MD  ondansetron  (ZOFRAN -ODT) 4 MG disintegrating tablet Take 1 tablet (4 mg total) by mouth every 8 (eight) hours as needed for vomiting or nausea. For 7 days supply 09/15/23  Yes Rogers Hai, MD  pioglitazone (ACTOS) 15 MG tablet Take 15 mg by mouth daily.   Yes [provider]  potassium chloride  SA (KLOR-CON  M) 20 MEQ tablet Take 2 tablets (40 mEq total) by mouth daily. Please resume on 10/31/2023 02/15/24  Yes Miriam Norris, NP  rosuvastatin  (CRESTOR ) 10 MG tablet Take 10 mg by mouth daily.   Yes [provider]  tiotropium (SPIRIVA ) 18 MCG inhalation capsule Place 1 capsule into inhaler and inhale daily. 02/03/24 02/02/25 Yes [provider]  ezetimibe  (ZETIA ) 10 MG tablet Take 1 tablet (10 mg total) by mouth daily. Patient not taking: Reported on 03/03/2024 10/27/23   Krishnan, Gokul, MD  isosorbide  mononitrate (IMDUR ) 30 MG 24 hr tablet Take 1 tablet (30 mg total) by mouth daily. Patient not taking: Reported on 03/03/2024 07/31/22   Pearlean Manus, MD  pregabalin  (LYRICA ) 50 MG capsule Take 1 capsule (50 mg total) by mouth 3 (three) times daily as needed. Patient not taking: Reported on 03/03/2024 05/31/23   Onita Duos, MD  rosuvastatin  (CRESTOR ) 20 MG tablet Take 1 tablet (20 mg total) by mouth daily. Patient not taking: Reported on 03/03/2024 10/04/23   Debera Jayson MATSU, MD    Scheduled Meds:  amLODipine   5 mg Oral Daily   anastrozole   1 mg Oral Daily   aspirin  EC  81 mg Oral Daily   buPROPion  150 mg Oral q morning   Chlorhexidine  Gluconate Cloth  6 each Topical Daily   doxycycline  100 mg Oral Q12H   DULoxetine   60 mg Oral Daily   ezetimibe   10 mg Oral Daily   gabapentin   200 mg Oral TID   guaiFENesin  1,200 mg Oral BID   heparin   5,000 Units Subcutaneous Q8H   insulin  aspart  0-20 Units Subcutaneous TID WC   insulin  aspart  0-5 Units Subcutaneous QHS   insulin  aspart  10 Units Subcutaneous TID WC   insulin  glargine  30 Units Subcutaneous Daily   montelukast  10 mg Oral QHS   pantoprazole   40 mg Oral Daily   rosuvastatin   10 mg Oral Daily   umeclidinium bromide   1 puff Inhalation Daily   Continuous Infusions:  PRN Meds: acetaminophen , dextrose , Influenza vac split trivalent PF, nitroGLYCERIN , mouth rinse  Allergies:    Allergies  Allergen Reactions   Latex Rash and Dermatitis    Social History:   Social History   Socioeconomic History   Marital status: Widowed    Spouse name: Not on file   Number of children: Not on file   Years of education: Not on file    Highest education level: Not on file  Occupational History   Not on file  Tobacco Use   Smoking status: Every Day    Current packs/day: 0.50    Average packs/day: 0.5 packs/day for  40.0 years (20.0 ttl pk-yrs)    Types: Cigarettes   Smokeless tobacco: Never  Vaping Use   Vaping status: Never Used  Substance and Sexual Activity   Alcohol use: Never   Drug use: Never   Sexual activity: Not Currently  Other Topics Concern   Not on file  Social History Narrative   Not on file   Family History:   Family History  Problem Relation Age of Onset   COPD Mother    Alcohol abuse Father    Hypertension Sister    Heart attack Brother      ROS:  Please see the history of present illness.  All other ROS reviewed and negative.     Physical Exam/Data: Vitals:   03/04/24 1423 03/04/24 1932 03/05/24 0317 03/05/24 0838  BP: (!) 95/59 124/71 108/72   Pulse: 100 92 87   Resp: 20 17 18    Temp: 98.6 F (37 C) 98.3 F (36.8 C) 99.5 F (37.5 C)   TempSrc: Oral Oral Oral   SpO2: 98% 98% 96% 95%  Weight:      Height:        Intake/Output Summary (Last 24 hours) at 03/05/2024 1014 Last data filed at 03/05/2024 0231 Gross per 24 hour  Intake 242.7 ml  Output 1300 ml  Net -1057.3 ml      03/03/2024    5:00 AM 03/02/2024    9:45 PM 03/02/2024    6:00 PM  Last 3 Weights  Weight (lbs) 207 lb 14.3 oz 201 lb 1 oz 196 lb 6.9 oz  Weight (kg) 94.3 kg 91.2 kg 89.1 kg     Body mass index is 35.68 kg/m.  General:  Well nourished, well developed, in no acute distress; sitting in chair at bedside HEENT: normal Neck: no JVD Vascular: No carotid bruits; Distal pulses 2+ bilaterally Cardiac:  normal S1, S2; RRR; no murmur  Lungs:  clear to auscultation bilaterally, no wheezing, rhonchi or rales  Abd: soft, nontender, no hepatomegaly  Ext: no edema Musculoskeletal:  No deformities, BUE and BLE strength normal and equal Skin: warm and dry  Neuro:  CNs 2-12 intact, no focal abnormalities  noted Psych:  Normal affect   EKG:  The EKG was personally reviewed and demonstrates:  NSR, HR 91, newly inverted T waves in lateral leads  Telemetry:  Telemetry was personally reviewed and demonstrates:  NSR, HR 80-90's, rare PVC  Relevant CV Studies: Cath 10/2021   Mid LM to Prox LAD lesion is 95% stenosed with 99% stenosed side Anjoli Diemer in Ost Cx.  Ramus lesion is 95% stenosed. - Trifucation lesion   Mid LAD stent is 5% stenosed.  Dist LAD stent is 10% stenosed.   Prox RCA lesion is 45% stenosed.   The left ventricular systolic function is normal.  The left ventricular ejection fraction is 50-55% by visual estimate.   There is no aortic valve stenosis.   ----------------------- Severe distal LM-trifurcation LAD, RI, LCx 95-99 % eccentric stenosis with minimal downstream disease. LAD has mid and distal vessel stent with diffuse calcification.  Stents are widely patent.  LAD reaches the apex.  1 very proximal ramus like small diagonal Ubah Radke followed by 2 additional diagonal branches.  Too small for bypass. Ramus or medius is a large-caliber vessel that reaches the apex with distal branches. LCx courses as of the lateral OM/LPL distally after giving rise to small to moderate-sized OM1, OM 2 and OM 4 with minuscule OM 3.  Too small for grafting.  RCA has proximal eccentric 45 to 50% stenosis with minimal distal disease giving rise to RPDA and 2 PL branches.  No significant disease in the RCA system. Preserved LVEF with mild mid to apical anterior hypokinesis. Hemodynamics:  Central AoP: 176/89 with a MAP 124 mmHg LVP/EDP: 172/5-15 mmHg   Recommendations: Urgent/emergent CVTS consultation with Dr. Lucas performed in the Cath Lab.  Plan is for emergent CABG Continue IABP via RFA Radial sheath sutured in place to be used for arterial line during procedure.  Removal in the CVICU post CABG with TR band placed by catheter supervisor.  ECHO IMPRESSIONS 10/25/2023  1. Left ventricular ejection  fraction, by estimation, is 60 to 65%. The  left ventricle has normal function. The left ventricle has no regional  wall motion abnormalities. There is mild left ventricular hypertrophy.  Left ventricular diastolic parameters  are consistent with Grade I diastolic dysfunction (impaired relaxation).   2. Right ventricular systolic function is normal. The right ventricular  size is normal.   3. The mitral valve is normal in structure. No evidence of mitral valve  regurgitation. No evidence of mitral stenosis.   4. The tricuspid valve is abnormal.   5. The aortic valve is tricuspid. Aortic valve regurgitation is not  visualized. No aortic stenosis is present.   6. The inferior vena cava is normal in size with greater than 50%  respiratory variability, suggesting right atrial pressure of 3 mmHg.   Lexiscan  10/2023   The study is normal. There are no perfusion defects consistent with prior infarct or current ischemia. The study is low risk.   No ST deviation was noted.   LV perfusion is normal.   Left ventricular function is normal. Nuclear stress EF: 61%. The left ventricular ejection fraction is normal (55-65%). End diastolic cavity size is normal.   There is a moderate size moderate intensity inferior defect that is most intense in the resting images with normal wall motion. Finding is consistent with artifact due to adjacent gut radiotracer uptake.  Laboratory Data: High Sensitivity Troponin:  No results for input(s): TROPONINIHS in the last 720 hours.   Chemistry Recent Labs  Lab 03/02/24 1830 03/02/24 2213 03/03/24 0255 03/04/24 0018 03/05/24 0354  NA 116*   < > 129* 130* 135  K 4.4   < > 3.5 4.0 3.5  CL 80*   < > 92* 96* 99  CO2 20*   < > 23 24 24   GLUCOSE 653*   < > 146* 236* 125*  BUN 47*   < > 40* 29* 23  CREATININE 3.16*   < > 2.44* 1.87* 1.58*  CALCIUM  11.6*   < > 10.7* 10.2 10.4*  MG 2.6*  --   --  1.9 1.9  GFRNONAA 15*   < > 20* 28* 34*  ANIONGAP 15   < > 14 10 11     < > = values in this interval not displayed.    Recent Labs  Lab 03/02/24 1830  PROT 7.3  ALBUMIN  4.1  AST 26  ALT 22  ALKPHOS 84  BILITOT 0.8   Lipids No results for input(s): CHOL, TRIG, HDL, LABVLDL, LDLCALC, CHOLHDL in the last 168 hours.  Hematology Recent Labs  Lab 03/02/24 1830 03/03/24 0255 03/04/24 0018  WBC 11.8* 13.3* 9.5  RBC 4.71 4.67 4.04  HGB 15.2* 14.9 13.0  HCT 43.0 43.7 37.5  MCV 91.3 93.6 92.8  MCH 32.3 31.9 32.2  MCHC 35.3 34.1 34.7  RDW 13.2 13.3 13.3  PLT 257 231 221   Thyroid  No results for input(s): TSH, FREET4 in the last 168 hours.  BNPNo results for input(s): BNP, PROBNP in the last 168 hours.  DDimer No results for input(s): DDIMER in the last 168 hours.  Radiology/Studies:  DG CHEST PORT 1 VIEW Result Date: 03/04/2024 CLINICAL DATA:  355200 Chest pain 355200 EXAM: PORTABLE CHEST 1 VIEW COMPARISON:  March 02, 2024, June 27, 2023 FINDINGS: The cardiomediastinal silhouette is unchanged in contour.Status post median sternotomy. Atherosclerotic calcifications. No pleural effusion. No pneumothorax. No acute pleuroparenchymal abnormality. IMPRESSION: No acute cardiopulmonary abnormality. Electronically Signed   By: Corean Salter M.D.   On: 03/04/2024 10:21   DG Chest Portable 1 View Result Date: 03/02/2024 CLINICAL DATA:  Shortness of breath, nausea, vomiting, diarrhea, and abdominal pain. Frequent urination. EXAM: PORTABLE CHEST 1 VIEW COMPARISON:  10/24/2023 FINDINGS: Postoperative changes in the mediastinum. Normal heart size and pulmonary vascularity. No focal airspace disease or consolidation in the lungs. No blunting of costophrenic angles. No pneumothorax. Mediastinal contours appear intact. Degenerative changes in spine and shoulders. IMPRESSION: No active disease. Electronically Signed   By: Elsie Gravely M.D.   On: 03/02/2024 19:42   CT ABDOMEN PELVIS WO CONTRAST Result Date: 03/02/2024 CLINICAL DATA:   Acute abdominal pain EXAM: CT ABDOMEN AND PELVIS WITHOUT CONTRAST TECHNIQUE: Multidetector CT imaging of the abdomen and pelvis was performed following the standard protocol without IV contrast. RADIATION DOSE REDUCTION: This exam was performed according to the departmental dose-optimization program which includes automated exposure control, adjustment of the mA and/or kV according to patient size and/or use of iterative reconstruction technique. COMPARISON:  None Available. FINDINGS: Lower chest: No acute abnormality. 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 Hepatobiliary: No focal liver abnormality is seen. No gallstones, gallbladder wall thickening, or biliary dilatation. Pancreas: Unremarkable. No pancreatic ductal dilatation or surrounding inflammatory changes. Spleen: Normal in size without focal abnormality. Adrenals/Urinary Tract: Adrenal glands are within normal limits. Kidneys are well visualized bilaterally. No renal calculi or obstructive changes are seen. 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 The bladder is well distended. Stomach/Bowel: Appendix is within normal limits. No obstructive or inflammatory changes of the colon are seen. Small bowel and stomach are within normal limits. Vascular/Lymphatic: Aortic atherosclerosis. No enlarged abdominal or pelvic lymph nodes. Reproductive: Status post hysterectomy. No adnexal masses. Other: No abdominal wall hernia or abnormality. No abdominopelvic ascites. Musculoskeletal: No acute or significant osseous findings. IMPRESSION: No acute abnormality to correspond with the given clinical history. Electronically Signed   By: Oneil Devonshire M.D.   On: 03/02/2024 19:29     Assessment and Plan: Atypical chest pain  CAD  HLD, LDL goal < 55 CABG in June 2023 including LIMA to LAD, SVG to OM, and SVG to ramus intermedius  ECHO 10/2023: EF 60 to 65%, mild LVH, G1 DD. Order Limited ECHO for LV function.   Lexiscan  10/2023: Low risk, EF 61%, normal LV perfusion 12/2023 LDL 38, Goal <55 per chart review, Dr. Debera d/c plavix  09/2023. TN 181 >173. Order troponin to check trend.  EKG: NSR, HR 91, newly inverted T waves in lateral leads.  Order repeat EKG  Reports chronic atypical unchanged chest pain and SOB w/ minimal exertion. Less concern for ACS with recent normal Lexiscan  and unchanged chest pain. However some concern with Tn and EKG changes as above. Can reassess based on pending results. Continue ASA 81 mg, amlodipine  5 mg daily, Crestor  10 mg  Order home Zeta 10 mg.  Continue to hold meds: Imdur  30 mg daily, losartan  100 mg daily   HTN  BP on soft side this am: 108/72 Order home med: Lopressor  25 mg BID Continue amlodipine  5 mg daily  Continue to hold meds:  Lasix  60 mg daily, Imdur  30 mg daily, losartan  100 mg daily   Prolonged Qtc  QTc 477 ms  Continue to avoid QT prolonging medications.   AKI  CR 3.16> 1.58 with IV fluids. Most likely due to dehydration.  Encourage to increase daily water  intake to 60 oz daily.  Continue to monitor.   Tobacco use Smokes 6-7 cigarettes per day.  Encouraged cessation    Lactic Acidosis  Trending down with IV fluids   HHS  Managed by primary team    Risk Assessment/Risk Scores:   TIMI Risk Score for Unstable Angina or Non-ST Elevation MI:   The patient's TIMI risk score is 5, which indicates a 26% risk of all cause mortality, new or recurrent myocardial infarction or need for urgent revascularization in the next 14 days.  [place]  Heart Association (NYHA) Functional Class NYHA Class III       For questions or updates, please contact [facility] Please consult [email or web address] for contact info under      Signed, Lorette CINDERELLA Kapur, PA-C  03/05/2024 10:14 AM   Attending Note  Patient seen and discussed with PA Kapur, I agree with her documentation. 73 yo female history of CAD with CABG in 2023 LIMA to LAD, SVG to  OM, and SVG to ramus intermedius, HTN, HLD, OSA, DM2, chronic pain syndrome, presented with nausea, vomiting, diarrhea. Admitted with multiple electrolyte abnormalities incliuding hyperglycemia, metabolic acidosis, lactic acidosis, hyponatremia, AKI on CKD. During evaluation had also mentioned some episodes of chest pain, cardiology consulted for evaluation.   She reports some intermittent chest pains over the last week. 6-7/10 mid to left chest, often occurs with activity. Can have some associated dizziness but denies any other associated symptoms. Lasts for about 15-20 minutes, not positional. Chronic chest pain wall pain in the vicinity that is tender to palpation.    Na 116 K 4.4 Glucose 653 CO2 20 Cr 3.16 BUN 47 WBC 11.8 Hgb 15.2 Plt 257 Mg 2.6 Lactic acid 3 -->3.8 -->2.6 HgbA1c 10.9 Procalc <0.10  Trop 181-->173--> EKG SR, lateral TWIs CXR: no acute process  10/2023 echo: LVEF 60-65%, no WMAs, grade I dd, normal RV 10/2023 nuclear stress: no ischemia    1.Chest pain/CAD - CAD with CABG in 2023 LIMA to LAD, SVG to OM, and SVG to ramus intermedius, 10/2023 echo: LVEF 60-65%, no WMAs, grade I dd, normal RV 10/2023 nuclear stress: no ischemia - EKG lateral TWIs, elevated trop to 181 trending down in setting of significant systemic illness as listed above. Not clear this is ACS.  - symptoms overall not classic for cardiac ischemia. Has some chronic tenderness to palpation over area. Recent viral syndrome including URI symptoms that may have played a role in symptoms.  - f/u additional trop, echo - I think if there is no further trop elevation or new significant echo findings this woul most likely  be demand ischemia and would not plan for repeat ischemic testing, had benign nuclear stress just 4 months ago.   Addendum 3pm: echo LVEF 25-30%, apical WMAs in a Takotsubo like pattern but cannot exclude LAD ischemia. Will discuss with patient possible cath. - with AKI on admission that is resolving  would hold on  ACE/ARB/ARNI/MRA, consider closer to discharge after cath pending renal function. Add toprol  25mg  daily.    2.Hyperosmolar hyperglycemic state - admitted with BG in the 600s, metabolic acidosis. Hyponatreamia, AKI on CKD, lactic acidosis.  -this was in the setting of likely viral syndrome with cough, nausea, vomiting, congestion.  - per primary team   Dorn Ross MD

## 2024-03-05 NOTE — Progress Notes (Signed)
   Spoke with patient and sister, Katelyn Kaufman, over the phone, about limited echo results including drop in ejection from 60-65% in 10/2023 to 25-30% today and new wall motion abnormalities. Discussed Takotsubo cardiomyopathy vs LAD ischemia. Discussed that Dr. Alvan recommended L/R cath. Explained procedure, benefits and risk. Patient and sister understands and agrees to move proceed with L/R cath.   Informed Consent   Shared Decision Making/Informed Consent{ The risks [stroke (1 in 1000), death (1 in 1000), kidney failure [usually temporary] (1 in 500), bleeding (1 in 200), allergic reaction [possibly serious] (1 in 200)], benefits (diagnostic support and management of coronary artery disease) and alternatives of a cardiac catheterization were discussed in detail with Katelyn Kaufman and she is willing to proceed.   Signed, Lorette CINDERELLA Kapur, PA-C 03/05/2024, 3:47 PM

## 2024-03-05 NOTE — Progress Notes (Signed)
 Care link called by secretary for heart cath tomorrow scheduled at 3. Patient has transfer order for bed at [facility] as well.

## 2024-03-05 NOTE — Evaluation (Signed)
 Physical Therapy Evaluation Patient Details Name: Katelyn Kaufman MRN: [id number] DOB: 10-21-1950 Today's Date: 03/05/2024  History of Present Illness  Katelyn Kaufman  is a 73 y.o. female,  with medical history significant of anxiety, coronary artery disease, COPD, depression, hypertension, hyperlipidemia, OSA, type 2 diabetes mellitus.  - Presents to ED secondary to complaints of nausea, vomiting, abdominal pain, cough and congestion, reports upper respiratory symptoms going on for last 2 weeks, denies any sick contact, reports subjective fever and chills at home, she reports abdominal pain, nausea and vomiting as well, has been going on for last 5 days, patient reports she is not on any insulin  anymore, not sure if she is in any oral hypoglycemic agents or not.   Clinical Impression  Patient functioning near baseline for functional mobility and gait other than c/o pain over buttocks and requiring increased time for transfers and ambulating in room/hallway without loss of balance and limited mostly due to fatigue. Patient tolerated sitting up in chair after therapy - RN aware. Patient will benefit from continued skilled physical therapy in hospital and recommended venue below to increase strength, balance, endurance for safe ADLs and gait.           If plan is discharge home, recommend the following: A little help with walking and/or transfers;A little help with bathing/dressing/bathroom;Help with stairs or ramp for entrance;Assistance with cooking/housework   Can travel by private vehicle        Equipment Recommendations None recommended by PT  Recommendations for Other Services       Functional Status Assessment Patient has had a recent decline in their functional status and demonstrates the ability to make significant improvements in function in a reasonable and predictable amount of time.     Precautions / Restrictions Precautions Precautions: Fall Recall of  Precautions/Restrictions: Intact Restrictions Weight Bearing Restrictions Per Provider Order: No      Mobility  Bed Mobility Overal bed mobility: Modified Independent                  Transfers Overall transfer level: Modified independent                      Ambulation/Gait Ambulation/Gait assistance: Supervision, Contact guard assist Gait Distance (Feet): 45 Feet Assistive device: Rolling walker (2 wheels) Gait Pattern/deviations: Decreased step length - right, Decreased step length - left, Decreased stride length, Trunk flexed Gait velocity: decreased     General Gait Details: slow labored movemet without loss of balance with good return for using RW, limited mostly due to fatigue  Stairs            Wheelchair Mobility     Tilt Bed    Modified Rankin (Stroke Patients Only)       Balance Overall balance assessment: Needs assistance Sitting-balance support: Feet supported, No upper extremity supported Sitting balance-Leahy Scale: Good Sitting balance - Comments: seated at EOB   Standing balance support: Reliant on assistive device for balance, During functional activity, Bilateral upper extremity supported Standing balance-Leahy Scale: Fair Standing balance comment: fair/good using RW                             Pertinent Vitals/Pain Pain Assessment Pain Assessment: 0-10 Pain Score: 8  Pain Location: buttocks Pain Descriptors / Indicators: Sore, Dull Pain Intervention(s): Limited activity within patient's tolerance, Monitored during session, Repositioned    Home Living Family/patient expects to be discharged to::  Private residence Living Arrangements: Children Available Help at Discharge: Friend(s);Available 24 hours/day Type of Home: House Home Access: Level entry       Home Layout: One level Home Equipment: Rollator (4 wheels);Cane - single point;BSC/3in1      Prior Function Prior Level of Function : Needs  assist       Physical Assist : Mobility (physical);ADLs (physical)     Mobility Comments: short distanced community ambulator with SPc and rollator ADLs Comments: Assit for bathing. Pt does not drive.     Extremity/Trunk Assessment   Upper Extremity Assessment Upper Extremity Assessment: Overall WFL for tasks assessed    Lower Extremity Assessment Lower Extremity Assessment: Generalized weakness    Cervical / Trunk Assessment Cervical / Trunk Assessment: Normal  Communication   Communication Communication: No apparent difficulties    Cognition Arousal: Alert Behavior During Therapy: WFL for tasks assessed/performed                             Following commands: Intact       Cueing Cueing Techniques: Verbal cues     General Comments      Exercises     Assessment/Plan    PT Assessment Patient needs continued PT services  PT Problem List Decreased strength;Decreased activity tolerance;Decreased balance;Decreased mobility       PT Treatment Interventions DME instruction;Gait training;Stair training;Functional mobility training;Therapeutic activities;Therapeutic exercise;Balance training;Patient/family education    PT Goals (Current goals can be found in the Care Plan section)  Acute Rehab PT Goals Patient Stated Goal: return home with family to assist PT Goal Formulation: With patient Time For Goal Achievement: 03/08/24 Potential to Achieve Goals: Good    Frequency Min 2X/week     Co-evaluation               AM-PAC PT 6 Clicks Mobility  Outcome Measure Help needed turning from your back to your side while in a flat bed without using bedrails?: None Help needed moving from lying on your back to sitting on the side of a flat bed without using bedrails?: A Little Help needed moving to and from a bed to a chair (including a wheelchair)?: A Little Help needed standing up from a chair using your arms (e.g., wheelchair or bedside  chair)?: A Little Help needed to walk in hospital room?: A Little Help needed climbing 3-5 steps with a railing? : A Little 6 Click Score: 19    End of Session   Activity Tolerance: Patient tolerated treatment well;Patient limited by fatigue Patient left: in chair;with call bell/phone within reach Nurse Communication: Mobility status PT Visit Diagnosis: Unsteadiness on feet (R26.81);Other abnormalities of gait and mobility (R26.89);Muscle weakness (generalized) (M62.81)    Time: 8967-8948 PT Time Calculation (min) (ACUTE ONLY): 19 min   Charges:   PT Evaluation $PT Eval Low Complexity: 1 Low PT Treatments $Therapeutic Activity: 8-22 mins PT General Charges $$ ACUTE PT VISIT: 1 Visit         3:26 PM, 03/05/24 Lynwood Music, MPT Physical Therapist with [facility] [phone] office [phone] mobile phone

## 2024-03-05 NOTE — Plan of Care (Signed)
  Problem: Acute Rehab PT Goals(only PT should resolve) Goal: Pt Will Go Supine/Side To Sit Outcome: Progressing Flowsheets (Taken 03/05/2024 1527) Pt will go Supine/Side to Sit: with modified independence Goal: Patient Will Transfer Sit To/From Stand Outcome: Progressing Flowsheets (Taken 03/05/2024 1527) Patient will transfer sit to/from stand: with modified independence Goal: Pt Will Transfer Bed To Chair/Chair To Bed Outcome: Progressing Flowsheets (Taken 03/05/2024 1527) Pt will Transfer Bed to Chair/Chair to Bed: with modified independence Goal: Pt Will Ambulate Outcome: Progressing Flowsheets (Taken 03/05/2024 1527) Pt will Ambulate:  50 feet  with modified independence  with rolling walker   3:28 PM, 03/05/24 Lynwood Music, MPT Physical Therapist with [facility] [phone] office [phone] mobile phone

## 2024-03-05 NOTE — TOC Progression Note (Addendum)
 Transition of Care [facility]) - Progression Note    Patient Details  Name: Katelyn Kaufman MRN: [id number] Date of Birth: 1951-04-10  Transition of Care [facility]) CM/SW Contact  Mcarthur Saddie Kim, [place] Phone Number: 03/05/2024, 10:56 AM  Clinical Narrative:  LCSW spoke with pt following PT evaluation. Pt aware no follow up needed and she indicates she did fairly well with them. She is aware her son in [place]  is concerned she may need assistance at home. Pt understands this would be private pay and said she has already looked into it and doesn't qualify for any other options. She states she will discuss with her son in [place] . Pt's other son stays with her to assist if needed.    Update: Pt now requests LCSW call son, Lynwood. Lynwood would like for pt to return to [place]  to be near him but pt wants to stay here. He feels he can provide better care to pt there. Lynwood is frustrated that family did not notify him that pt was admitted to hospital. He understands pt does not have any PT needs. He is concerned that pt needs some assistance. Lynwood is aware this would be private pay. Family to discuss options with pt. Lynwood also understands pt is alert and oriented x4 so pt would have to agree to plan. Pt plans to return home at this point.     Barriers to Discharge: Continued Medical Work up               Expected Discharge Plan and Services                                               Social Drivers of Health (SDOH) Interventions SDOH Screenings   Food Insecurity: No Food Insecurity (03/02/2024)  Housing: Low Risk  (03/02/2024)  Transportation Needs: No Transportation Needs (03/02/2024)  Utilities: Not At Risk (03/02/2024)  Depression (PHQ2-9): Medium Risk (02/29/2024)  Financial Resource Strain: Low Risk  (02/10/2023)   Received from [facility]  Recent Concern: Financial Resource Strain - Medium Risk (11/24/2022)   Received from [facility]  Physical Activity:  Insufficiently Active (12/30/2022)   Received from [facility]  Social Connections: Moderately Integrated (10/24/2023)  Stress: Stress Concern Present (12/14/2023)   Received from [facility]  Tobacco Use: High Risk (03/02/2024)  Health Literacy: Low Risk  (02/03/2024)   Received from [facility]    Readmission Risk Interventions    10/27/2023   10:46 AM 11/23/2021    9:39 AM  Readmission Risk Prevention Plan  Post Dischage Appt  Complete  Medication Screening  Complete  Transportation Screening Complete Complete  PCP or Specialist Appt within 5-7 Days Complete   Home Care Screening Complete   Medication Review (RN CM) Complete

## 2024-03-06 ENCOUNTER — Encounter (HOSPITAL_COMMUNITY)
Admission: EM | Disposition: A | Discharge status: Home / Self Care | Payer: Self-pay | Source: Home / Self Care | Attending: Family Medicine

## 2024-03-06 DIAGNOSIS — I2510 Atherosclerotic heart disease of native coronary artery without angina pectoris: Secondary | ICD-10-CM | POA: Diagnosis not present

## 2024-03-06 DIAGNOSIS — Z794 Long term (current) use of insulin: Secondary | ICD-10-CM | POA: Diagnosis not present

## 2024-03-06 DIAGNOSIS — N179 Acute kidney failure, unspecified: Secondary | ICD-10-CM

## 2024-03-06 DIAGNOSIS — I5181 Takotsubo syndrome: Secondary | ICD-10-CM | POA: Insufficient documentation

## 2024-03-06 DIAGNOSIS — I429 Cardiomyopathy, unspecified: Secondary | ICD-10-CM | POA: Diagnosis not present

## 2024-03-06 DIAGNOSIS — R0789 Other chest pain: Secondary | ICD-10-CM | POA: Diagnosis not present

## 2024-03-06 DIAGNOSIS — E1100 Type 2 diabetes mellitus with hyperosmolarity without nonketotic hyperglycemic-hyperosmolar coma (NKHHC): Secondary | ICD-10-CM | POA: Diagnosis not present

## 2024-03-06 DIAGNOSIS — E118 Type 2 diabetes mellitus with unspecified complications: Secondary | ICD-10-CM | POA: Diagnosis not present

## 2024-03-06 HISTORY — PX: RIGHT/LEFT HEART CATH AND CORONARY/GRAFT ANGIOGRAPHY: CATH118267

## 2024-03-06 LAB — POCT I-STAT EG7
Acid-Base Excess: 1 mmol/L (ref 0.0–2.0)
Acid-Base Excess: 1 mmol/L (ref 0.0–2.0)
Acid-base deficit: 1 mmol/L (ref 0.0–2.0)
Acid-base deficit: 1 mmol/L (ref 0.0–2.0)
Bicarbonate: 23.8 mmol/L (ref 20.0–28.0)
Bicarbonate: 23.9 mmol/L (ref 20.0–28.0)
Bicarbonate: 26.6 mmol/L (ref 20.0–28.0)
Bicarbonate: 26.9 mmol/L (ref 20.0–28.0)
Calcium, Ion: 1.48 mmol/L — ABNORMAL HIGH (ref 1.15–1.40)
Calcium, Ion: 1.49 mmol/L — ABNORMAL HIGH (ref 1.15–1.40)
Calcium, Ion: 0.91 mmol/L — ABNORMAL LOW (ref 1.15–1.40)
Calcium, Ion: 0.91 mmol/L — ABNORMAL LOW (ref 1.15–1.40)
HCT: 35 % — ABNORMAL LOW (ref 36.0–46.0)
HCT: 36 % (ref 36.0–46.0)
HCT: 30 % — ABNORMAL LOW (ref 36.0–46.0)
HCT: 30 % — ABNORMAL LOW (ref 36.0–46.0)
Hemoglobin: 11.9 g/dL — ABNORMAL LOW (ref 12.0–15.0)
Hemoglobin: 12.2 g/dL (ref 12.0–15.0)
Hemoglobin: 10.2 g/dL — ABNORMAL LOW (ref 12.0–15.0)
Hemoglobin: 10.2 g/dL — ABNORMAL LOW (ref 12.0–15.0)
O2 Saturation: 59 %
O2 Saturation: 61 %
O2 Saturation: 60 %
O2 Saturation: 64 %
Potassium: 3.5 mmol/L (ref 3.5–5.1)
Potassium: 3.5 mmol/L (ref 3.5–5.1)
Potassium: 3.3 mmol/L — ABNORMAL LOW (ref 3.5–5.1)
Potassium: 3.3 mmol/L — ABNORMAL LOW (ref 3.5–5.1)
Sodium: 137 mmol/L (ref 135–145)
Sodium: 137 mmol/L (ref 135–145)
Sodium: 132 mmol/L — ABNORMAL LOW (ref 135–145)
Sodium: 132 mmol/L — ABNORMAL LOW (ref 135–145)
TCO2: 25 mmol/L (ref 22–32)
TCO2: 25 mmol/L (ref 22–32)
TCO2: 28 mmol/L (ref 22–32)
TCO2: 28 mmol/L (ref 22–32)
pCO2, Ven: 41 mmHg — ABNORMAL LOW (ref 44–60)
pCO2, Ven: 41.1 mmHg — ABNORMAL LOW (ref 44–60)
pCO2, Ven: 45.8 mmHg (ref 44–60)
pCO2, Ven: 46.2 mmHg (ref 44–60)
pH, Ven: 7.371 (ref 7.25–7.43)
pH, Ven: 7.372 (ref 7.25–7.43)
pH, Ven: 7.372 (ref 7.25–7.43)
pH, Ven: 7.373 (ref 7.25–7.43)
pO2, Ven: 32 mmHg (ref 32–45)
pO2, Ven: 33 mmHg (ref 32–45)
pO2, Ven: 32 mmHg (ref 32–45)
pO2, Ven: 34 mmHg (ref 32–45)

## 2024-03-06 LAB — CREATININE, SERUM
Creatinine, Ser: 1.47 mg/dL — ABNORMAL HIGH (ref 0.44–1.00)
GFR, Estimated: 37 mL/min — ABNORMAL LOW (ref >60)

## 2024-03-06 LAB — POCT I-STAT 7, (LYTES, BLD GAS, ICA,H+H)
Acid-Base Excess: 0 mmol/L (ref 0.0–2.0)
Acid-base deficit: 3 mmol/L — ABNORMAL HIGH (ref 0.0–2.0)
Bicarbonate: 20.2 mmol/L (ref 20.0–28.0)
Bicarbonate: 24.4 mmol/L (ref 20.0–28.0)
Calcium, Ion: 1.45 mmol/L — ABNORMAL HIGH (ref 1.15–1.40)
Calcium, Ion: 0.92 mmol/L — ABNORMAL LOW (ref 1.15–1.40)
HCT: 35 % — ABNORMAL LOW (ref 36.0–46.0)
HCT: 30 % — ABNORMAL LOW (ref 36.0–46.0)
Hemoglobin: 11.9 g/dL — ABNORMAL LOW (ref 12.0–15.0)
Hemoglobin: 10.2 g/dL — ABNORMAL LOW (ref 12.0–15.0)
O2 Saturation: 98 %
O2 Saturation: 97 %
Potassium: 3.5 mmol/L (ref 3.5–5.1)
Potassium: 3.3 mmol/L — ABNORMAL LOW (ref 3.5–5.1)
Sodium: 136 mmol/L (ref 135–145)
Sodium: 131 mmol/L — ABNORMAL LOW (ref 135–145)
TCO2: 21 mmol/L — ABNORMAL LOW (ref 22–32)
TCO2: 26 mmol/L (ref 22–32)
pCO2 arterial: 28.4 mmHg — ABNORMAL LOW (ref 32–48)
pCO2 arterial: 38.8 mmHg (ref 32–48)
pH, Arterial: 7.461 — ABNORMAL HIGH (ref 7.35–7.45)
pH, Arterial: 7.407 (ref 7.35–7.45)
pO2, Arterial: 98 mmHg (ref 83–108)
pO2, Arterial: 86 mmHg (ref 83–108)

## 2024-03-06 LAB — BASIC METABOLIC PANEL WITH GFR
Anion gap: 11 (ref 5–15)
BUN: 25 mg/dL — ABNORMAL HIGH (ref 8–23)
CO2: 23 mmol/L (ref 22–32)
Calcium: 10.4 mg/dL — ABNORMAL HIGH (ref 8.9–10.3)
Chloride: 101 mmol/L (ref 98–111)
Creatinine, Ser: 1.65 mg/dL — ABNORMAL HIGH (ref 0.44–1.00)
GFR, Estimated: 32 mL/min — ABNORMAL LOW (ref >60)
Glucose, Bld: 157 mg/dL — ABNORMAL HIGH (ref 70–99)
Potassium: 3.7 mmol/L (ref 3.5–5.1)
Sodium: 135 mmol/L (ref 135–145)

## 2024-03-06 LAB — CBC
HCT: 40.3 % (ref 36.0–46.0)
Hemoglobin: 13.7 g/dL (ref 12.0–15.0)
MCH: 32.2 pg (ref 26.0–34.0)
MCHC: 34 g/dL (ref 30.0–36.0)
MCV: 94.8 fL (ref 80.0–100.0)
Platelets: 208 K/uL (ref 150–400)
RBC: 4.25 MIL/uL (ref 3.87–5.11)
RDW: 13.8 % (ref 11.5–15.5)
WBC: 8 K/uL (ref 4.0–10.5)
nRBC: 0 % (ref 0.0–0.2)

## 2024-03-06 LAB — GLUCOSE, CAPILLARY
Glucose-Capillary: 198 mg/dL — ABNORMAL HIGH (ref 70–99)
Glucose-Capillary: 170 mg/dL — ABNORMAL HIGH (ref 70–99)
Glucose-Capillary: 114 mg/dL — ABNORMAL HIGH (ref 70–99)
Glucose-Capillary: 168 mg/dL — ABNORMAL HIGH (ref 70–99)

## 2024-03-06 LAB — MAGNESIUM: Magnesium: 2 mg/dL (ref 1.7–2.4)

## 2024-03-06 SURGERY — RIGHT/LEFT HEART CATH AND CORONARY/GRAFT ANGIOGRAPHY
Anesthesia: LOCAL

## 2024-03-06 MED ORDER — SODIUM CHLORIDE 0.9% FLUSH
3.0000 mL | Freq: Two times a day (BID) | INTRAVENOUS | Status: DC
  Administered 2024-03-06 – 2024-03-08 (×2): 3 mL via INTRAVENOUS

## 2024-03-06 MED ORDER — SODIUM CHLORIDE 0.9 % IV SOLN: INTRAVENOUS | Status: AC

## 2024-03-06 MED ORDER — LABETALOL HCL 5 MG/ML IV SOLN: 10.0000 mg | INTRAVENOUS | Status: AC | PRN

## 2024-03-06 MED ORDER — HEPARIN SODIUM (PORCINE) 5000 UNIT/ML IJ SOLN
5000.0000 [IU] | Freq: Three times a day (TID) | INTRAMUSCULAR | Status: DC
  Administered 2024-03-06 – 2024-03-09 (×8): 5000 [IU] via SUBCUTANEOUS
  Filled 2024-03-06 (×8): qty 1

## 2024-03-06 MED ORDER — SODIUM CHLORIDE 0.9 % WEIGHT BASED INFUSION
3.0000 mL/kg/h | INTRAVENOUS | Status: AC
  Administered 2024-03-06: 3 mL/kg/h via INTRAVENOUS

## 2024-03-06 MED ORDER — FENTANYL CITRATE (PF) 100 MCG/2ML IJ SOLN
INTRAMUSCULAR | Status: DC | PRN
  Administered 2024-03-06: 25 ug via INTRAVENOUS

## 2024-03-06 MED ORDER — SODIUM CHLORIDE 0.9 % IV SOLN: 250.0000 mL | INTRAVENOUS | Status: AC | PRN

## 2024-03-06 MED ORDER — VERAPAMIL HCL 2.5 MG/ML IV SOLN
INTRAVENOUS | Status: DC | PRN
  Administered 2024-03-06: 10 mL via INTRA_ARTERIAL

## 2024-03-06 MED ORDER — LIDOCAINE HCL (PF) 1 % IJ SOLN
INTRAMUSCULAR | Status: DC | PRN
  Administered 2024-03-06 (×2): 2 mL via INTRADERMAL

## 2024-03-06 MED ORDER — HYDRALAZINE HCL 20 MG/ML IJ SOLN: 10.0000 mg | INTRAMUSCULAR | Status: AC | PRN

## 2024-03-06 MED ORDER — SODIUM CHLORIDE 0.9 % WEIGHT BASED INFUSION: 1.0000 mL/kg/h | INTRAVENOUS | Status: DC

## 2024-03-06 MED ORDER — MIDAZOLAM HCL 2 MG/2ML IJ SOLN
INTRAMUSCULAR | Status: DC | PRN
  Administered 2024-03-06: 1 mg via INTRAVENOUS

## 2024-03-06 MED ORDER — HEPARIN SODIUM (PORCINE) 1000 UNIT/ML IJ SOLN
INTRAMUSCULAR | Status: DC | PRN
  Administered 2024-03-06: 4500 [IU] via INTRAVENOUS

## 2024-03-06 MED ORDER — SODIUM CHLORIDE 0.9% FLUSH: 3.0000 mL | INTRAVENOUS | Status: DC | PRN

## 2024-03-06 MED ORDER — HEPARIN (PORCINE) IN NACL 1000-0.9 UT/500ML-% IV SOLN
INTRAVENOUS | Status: DC | PRN
  Administered 2024-03-06: 1000 mL

## 2024-03-06 MED ORDER — SODIUM CHLORIDE 0.9 % WEIGHT BASED INFUSION: 3.0000 mL/kg/h | INTRAVENOUS | Status: DC

## 2024-03-06 MED ORDER — IOHEXOL 350 MG/ML SOLN
INTRAVENOUS | Status: DC | PRN
Start: 2024-03-06 — End: 2024-03-06
  Administered 2024-03-06: 110 mL

## 2024-03-06 SURGICAL SUPPLY — 14 items
CATH BALLN WEDGE 5F 110CM (CATHETERS) IMPLANT
CATH EXPO 5F MPA-1 (CATHETERS) IMPLANT
CATH INFINITI 5 FR AL2 (CATHETERS) IMPLANT
CATH INFINITI 5 FR AR1 MOD (CATHETERS) IMPLANT
CATH INFINITI 5 FR JL3.5 (CATHETERS) IMPLANT
CATH INFINITI JR4 5F (CATHETERS) IMPLANT
CATH LAUNCHER 6FR AL1 (CATHETERS) IMPLANT
DEVICE RAD COMP TR BAND LRG (VASCULAR PRODUCTS) IMPLANT
GLIDESHEATH SLEND SS 6F .021 (SHEATH) IMPLANT
GUIDEWIRE INQWIRE 1.5J.035X260 (WIRE) IMPLANT
PACK CARDIAC CATHETERIZATION (CUSTOM PROCEDURE TRAY) ×1 IMPLANT
SET ATX-X65L (MISCELLANEOUS) IMPLANT
SHEATH GLIDE SLENDER 4/5FR (SHEATH) IMPLANT
SHEATH PROBE COVER 6X72 (BAG) IMPLANT

## 2024-03-06 NOTE — Progress Notes (Signed)
 Rounding Note   Patient Name: Katelyn Kaufman Date of Encounter: 03/06/2024  [facility] Cardiologist: Debera  Subjective No complaints  Scheduled Meds:  amLODipine   5 mg Oral Daily   anastrozole   1 mg Oral Daily   aspirin  EC  81 mg Oral Daily   buPROPion  150 mg Oral q morning   Chlorhexidine  Gluconate Cloth  6 each Topical Daily   DULoxetine   60 mg Oral Daily   ezetimibe   10 mg Oral Daily   gabapentin   200 mg Oral TID   guaiFENesin  1,200 mg Oral BID   heparin   5,000 Units Subcutaneous Q8H   insulin  aspart  0-20 Units Subcutaneous TID WC   insulin  aspart  0-5 Units Subcutaneous QHS   insulin  aspart  12 Units Subcutaneous TID WC   insulin  glargine  34 Units Subcutaneous Daily   metoprolol  succinate  25 mg Oral Daily   montelukast  10 mg Oral QHS   pantoprazole   40 mg Oral Daily   rosuvastatin   10 mg Oral Daily   senna-docusate  2 tablet Oral QHS   umeclidinium bromide   1 puff Inhalation Daily   Continuous Infusions:  sodium chloride  1 mL/kg/hr (03/06/24 0624)   PRN Meds: acetaminophen , dextrose , Influenza vac split trivalent PF, nitroGLYCERIN , mouth rinse   Vital Signs  Vitals:   03/05/24 1247 03/05/24 1937 03/06/24 0502 03/06/24 0800  BP: 120/72 115/68 122/80   Pulse: 87 81 77   Resp: 18 18 20    Temp: 97.9 F (36.6 C) 98 F (36.7 C) 97.7 F (36.5 C)   TempSrc: Oral Oral Oral   SpO2: 95% 93% 92% 95%  Weight:      Height:        Intake/Output Summary (Last 24 hours) at 03/06/2024 0854 Last data filed at 03/05/2024 1937 Gross per 24 hour  Intake 840 ml  Output 500 ml  Net 340 ml      03/03/2024    5:00 AM 03/02/2024    9:45 PM 03/02/2024    6:00 PM  Last 3 Weights  Weight (lbs) 207 lb 14.3 oz 201 lb 1 oz 196 lb 6.9 oz  Weight (kg) 94.3 kg 91.2 kg 89.1 kg      Telemetry NSR - Personally Reviewed  ECG  N/a - Personally Reviewed  Physical Exam  GEN: No acute distress.   Neck: No JVD Cardiac: RRR, no murmurs, rubs, or  gallops.  Respiratory: Clear to auscultation bilaterally. GI: Soft, nontender, non-distended  MS: No edema; No deformity. Neuro:  Nonfocal  Psych: Normal affect   Labs High Sensitivity Troponin:  No results for input(s): TROPONINIHS in the last 720 hours.   Chemistry Recent Labs  Lab 03/02/24 1830 03/02/24 2213 03/04/24 0018 03/05/24 0354 03/06/24 0430  NA 116*   < > 130* 135 135  K 4.4   < > 4.0 3.5 3.7  CL 80*   < > 96* 99 101  CO2 20*   < > 24 24 23   GLUCOSE 653*   < > 236* 125* 157*  BUN 47*   < > 29* 23 25*  CREATININE 3.16*   < > 1.87* 1.58* 1.65*  CALCIUM  11.6*   < > 10.2 10.4* 10.4*  MG 2.6*  --  1.9 1.9 2.0  PROT 7.3  --   --   --   --   ALBUMIN  4.1  --   --   --   --   AST 26  --   --   --   --  ALT 22  --   --   --   --   ALKPHOS 84  --   --   --   --   BILITOT 0.8  --   --   --   --   GFRNONAA 15*   < > 28* 34* 32*  ANIONGAP 15   < > 10 11 11    < > = values in this interval not displayed.    Lipids No results for input(s): CHOL, TRIG, HDL, LABVLDL, LDLCALC, CHOLHDL in the last 168 hours.  Hematology Recent Labs  Lab 03/02/24 1830 03/03/24 0255 03/04/24 0018  WBC 11.8* 13.3* 9.5  RBC 4.71 4.67 4.04  HGB 15.2* 14.9 13.0  HCT 43.0 43.7 37.5  MCV 91.3 93.6 92.8  MCH 32.3 31.9 32.2  MCHC 35.3 34.1 34.7  RDW 13.2 13.3 13.3  PLT 257 231 221   Thyroid  No results for input(s): TSH, FREET4 in the last 168 hours.  BNPNo results for input(s): BNP, PROBNP in the last 168 hours.  DDimer No results for input(s): DDIMER in the last 168 hours.   Radiology  ECHOCARDIOGRAM LIMITED Result Date: 03/05/2024    ECHOCARDIOGRAM LIMITED REPORT   [name] Date of Exam: 03/05/2024 Medical Rec #:  [id number]        Height:       64.0 in Accession #:    [id number]       Weight:       207.9 lb Date of Birth:  Jan 09, 1951         BSA:          1.989 m Patient Age:    73 years         BP:           108/72 mmHg Patient Gender: F                 HR:           87 bpm. Exam Location:  [name] Procedure: Limited Echo and Intracardiac Opacification Agent (Both Spectral and            Color Flow Doppler were utilized during procedure). Indications:    Chest Pain R07.9  History:        Patient has prior history of Echocardiogram examinations, most                 recent 10/25/2023. CHF, CAD and Previous Myocardial Infarction,                 Prior CABG; Risk Factors:Hypertension, Diabetes, Dyslipidemia                 and Current Smoker.  Sonographer:    Aida Pizza RCS Referring Phys: [id number] LORETTE CINDERELLA KAPUR IMPRESSIONS  1. Left ventricular ejection fraction, by estimation, is 25 to 30%. The left ventricle has severely decreased function. Wall motion abnormalities suggestive of possible stress induced cardiomyopathy (Takotsubo pattern), cannot exclude possible LAD territory ischemia.  2. The inferior vena cava is normal in size with greater than 50% respiratory variability, suggesting right atrial pressure of 3 mmHg. FINDINGS  Left Ventricle: Left ventricular ejection fraction, by estimation, is 25 to 30%. The left ventricle has severely decreased function. Definity contrast agent was given IV to delineate the left ventricular endocardial borders.  LV Wall Scoring: The entire apex is akinetic. The anterior wall and anterior septum are hypokinetic. Venous: The inferior vena cava is normal in size  with greater than 50% respiratory variability, suggesting right atrial pressure of 3 mmHg. LEFT VENTRICLE PLAX 2D LVIDd:         3.70 cm LVIDs:         2.60 cm LV PW:         1.10 cm LV IVS:        1.30 cm  LV Volumes (MOD) LV vol d, MOD A2C: 81.1 ml LV vol d, MOD A4C: 86.3 ml LV vol s, MOD A2C: 57.2 ml LV vol s, MOD A4C: 61.5 ml LV SV MOD A2C:     23.9 ml LV SV MOD A4C:     86.3 ml LV SV MOD BP:      27.3 ml LEFT ATRIUM         Index LA diam:    2.60 cm 1.31 cm/m   AORTA Ao Root diam: 2.80 cm Dorn Ross MD Electronically signed by Dorn Ross MD  Signature Date/Time: 03/05/2024/3:09:43 PM    Final      Patient Profile   Katelyn Kaufman is a 73 y.o. female with a hx of CAD (s/p CABG in 10/2021 LIMA to LAD, SVG to OM, and SVG to ramus intermedius), HTN, HLD, OSA, DM2, breast cancer s/p L mastectomy, depression/anxiety, chronic pain syndrome, tobacco abuse  who is being seen 03/05/2024 for the evaluation of chest pain at the request of Dr. Vicci.   Assessment & Plan   1.Chest pain/CAD - CAD with CABG in 2023 LIMA to LAD, SVG to OM, and SVG to ramus intermedius, 10/2023 echo: LVEF 60-65%, no WMAs, grade I dd, normal RV 10/2023 nuclear stress: no ischemia - EKG lateral TWIs, elevated trop to 181 trending down in setting of significant systemic illness. Presented with hyperglycemia, metabolic acidosis, lactic acidosis, hyponatremia, AKI on CKD, N/V/D, URI symptoms probable viral syndrome.  - - symptoms overall not classic for cardiac ischemia. Has some chronic tenderness to palpation over area. Recent viral syndrome including URI symptoms that may have played a role in symptoms.  echo LVEF 25-30%, apical WMAs in a Takotsubo like pattern but cannot exclude LAD ischemia.   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 Plan for RHC/LHC today - medical therapy with toprol  25mg .GIven some CKD and initial AKI on admission holding on ACE/ARB/ARNI/MRA/SGLT2i prior to cath, can consider post cath pending renal function.      2.Hyperosmolar hyperglycemic state - admitted with BG in the 600s, metabolic acidosis. Hyponatreamia, AKI on CKD, lactic acidosis.  -this was in the setting of likely viral syndrome with cough, nausea, vomiting, congestion.  - per primary team   Informed Consent   Shared Decision Making/Informed Consent The risks [stroke (1 in 1000), death (1 in 1000), kidney failure [usually temporary] (1 in 500), bleeding (1 in 200), allergic reaction [possibly serious] (1 in 200)], benefits (diagnostic support and  management of coronary artery disease) and alternatives of a cardiac catheterization were discussed in detail with Ms. Degracia and she is willing to proceed.        For questions or updates, please contact [facility] Please consult [email or web address] for contact info under       Signed, Ross Dorn, MD  03/06/2024, 8:54 AM

## 2024-03-06 NOTE — Progress Notes (Signed)
 PROGRESS NOTE   Katelyn Kaufman  FMW:[id number] DOB: 07/28/1950 DOA: 03/02/2024 PCP: [facility]   Level of care: Telemetry Cardiac  Brief Admission History:  73 y.o. female,  with medical history significant of anxiety, coronary artery disease, COPD, depression, hypertension, hyperlipidemia, OSA, type 2 diabetes mellitus. - Presents to ED secondary to complaints of nausea, vomiting, abdominal pain, cough and congestion, reports upper respiratory symptoms going on for last 2 weeks, denies any sick contact, reports subjective fever and chills at home, she reports abdominal pain, nausea and vomiting as well, has been going on for last 5 days, patient reports she is not on any insulin  anymore, not sure if she is in any oral hypoglycemic agents or not. -NAD CT abdomen pelvis with no acute findings, chest x-ray with no acute findings, COVID, RSV and influenza are negative, workup significant for low sodium at 116, but once corrected to per glycemia of 653 it is 125, creatinine up from 1.9 last week to 3.16, white blood cell count elevated at 11.8, lactic acid elevated at 3, UA significant for glucose urea but no ketonuria,.  [facility] consulted to admit   Principal Problem:   Hyperosmolar hyperglycemic state (HHS) (HCC) Active Problems:   Type 2 diabetes mellitus with complication, with long-term current use of insulin  (HCC)   Hypertension   Hyperlipidemia   Coronary artery disease   Class 2 obesity  Assessment and Plan:  HHS - TREATED  - Patient poor historian, but she is sure she is not on any insulin , unsure if she has any oral diabetes medicine or not - transitioned off IV insulin  to subcutaneous insulin  - increased glargine to 34 units, novolog  12 units TID with meals, aggressive SSI coverage and continue frequent CBG monitoring.  - advance diet to carb modified  - DM coordinator working with RN to instruct patient on self-insulin  administration CBG (last 3)  Recent  Labs    03/05/24 2149 03/06/24 0224 03/06/24 0740  GLUCAP 167* 198* 170*    Hyponatremia - improved  Chest Pain - symptoms seem better now - pt presented early this morning with an acute chest pain episode and having recurrent chest pain - HS troponins have been flat and ACS ruled out  - check portable CXR -- no acute findings  - given recurrent CP symptoms requested cardiology consult  - IV famotidine  and PPI ordered to treat for possible GI causes  - updated TTE ordered by cardiology team -- see below  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% with apical WMAs, new since TTE done in June 2025 - cardiology team planning for LHC today at [facility] - transfer orders to [facility] placed - question of Takotsubo     Abdominal pain, nausea, vomiting cough, Congestion-- URI - resolving symptoms -Finding most likely related to acute viral illness COVID, RSV and influenza negative -Will check respiratory panel, she has significant cough, congestion, encouraged use incentive spirometry, flutter valve, will start on Mucinex - Chest x-ray with no acute findings-CT abdomen pelvis with no acute findings - checked procalcitonin and was reassuring and therefore will stop antibiotics    Prolonged QTc - Monitor on telemetry and avoid prolonging agents, potassium more than 4, but will correct as anticipate it will drop, magnesium  more than 2  Hypercalcemia - pt being worked up by her nephrologist Dr. 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 Rachele    Lactic acidosis - Likely  due to volume depletion, trended down with IV fluids    Coronary artery disease - Continue aspirin , resume Crestor  and metoprolol  when blood pressure is stable and able to take oral -- cardiology consult due to recurrent complaints of chest pain  -- transfer to [facility] today for LHC (arranged by cardiologist) -- metoprolol  succinate 25 mg daily ordered by  cardiology team    HTN- -pressure is soft on presentation, continue to hold meds  Acute kidney injury on chronic kidney disease stage IIIb - Volume depletion and dehydration, continue with IV fluids and avoid nephrotoxic medications   Depression/Anxiety -Hold Cymbalta  due to prolonged QTc   Chronic pain syndrome -Subutex , resume once stable, both on hold for prolonged QTc, resume gabapentin  when able to take oral    History of left-sided breast cancer -Continue Arimidex   DVT prophylaxis: sq heparin  Code Status: Full  Family Communication:  Disposition: anticipate return home when cleared by cardiology    Consultants:   Procedures:   Antimicrobials:    Subjective: Denies chest pain and SOB.    Objective: Vitals:   03/05/24 1247 03/05/24 1937 03/06/24 0502 03/06/24 0800  BP: 120/72 115/68 122/80   Pulse: 87 81 77   Resp: 18 18 20    Temp: 97.9 F (36.6 C) 98 F (36.7 C) 97.7 F (36.5 C)   TempSrc: Oral Oral Oral   SpO2: 95% 93% 92% 95%  Weight:      Height:        Intake/Output Summary (Last 24 hours) at 03/06/2024 0928 Last data filed at 03/05/2024 1937 Gross per 24 hour  Intake 600 ml  Output 500 ml  Net 100 ml   Filed Weights   03/02/24 1800 03/02/24 2145 03/03/24 0500  Weight: 89.1 kg 91.2 kg 94.3 kg   Examination:  General exam: Appears calm and comfortable  Respiratory system: no increased work of breathing.  Cardiovascular system: normal S1 & S2 heard. No JVD, murmurs, rubs, gallops or clicks. No pedal edema. Gastrointestinal system: Abdomen is nondistended, soft and nontender. No organomegaly or masses felt. Normal bowel sounds heard. Central nervous system: Alert and oriented. No focal neurological deficits. Extremities: Symmetric 5 x 5 power. Skin: No rashes, lesions or ulcers. Psychiatry: Judgement and insight appear normal. Mood & affect appropriate.   Data Reviewed: I have personally reviewed following labs and imaging  studies  CBC: Recent Labs  Lab 03/02/24 1830 03/03/24 0255 03/04/24 0018  WBC 11.8* 13.3* 9.5  NEUTROABS 8.7*  --  5.6  HGB 15.2* 14.9 13.0  HCT 43.0 43.7 37.5  MCV 91.3 93.6 92.8  PLT 257 231 221    Basic Metabolic Panel: Recent Labs  Lab 03/02/24 1830 03/02/24 2213 03/03/24 0255 03/04/24 0018 03/05/24 0354 03/06/24 0430  NA 116* 131* 129* 130* 135 135  K 4.4 3.4* 3.5 4.0 3.5 3.7  CL 80* 91* 92* 96* 99 101  CO2 20* 24 23 24 24 23   GLUCOSE 653* 197* 146* 236* 125* 157*  BUN 47* 44* 40* 29* 23 25*  CREATININE 3.16* 2.78* 2.44* 1.87* 1.58* 1.65*  CALCIUM  11.6* 11.2* 10.7* 10.2 10.4* 10.4*  MG 2.6*  --   --  1.9 1.9 2.0    CBG: Recent Labs  Lab 03/05/24 1129 03/05/24 1616 03/05/24 2149 03/06/24 0224 03/06/24 0740  GLUCAP 235* 220* 167* 198* 170*    Recent Results (from the past 240 hours)  Resp panel by RT-PCR (RSV, Flu A&B, Covid) Anterior Nasal Swab     Status: None  Collection Time: 03/02/24  7:33 PM   Specimen: Anterior Nasal Swab  Result Value Ref Range Status   SARS Coronavirus 2 by RT PCR NEGATIVE NEGATIVE Final    Comment: (NOTE) SARS-CoV-2 target nucleic acids are NOT DETECTED.  The SARS-CoV-2 RNA is generally detectable in upper respiratory specimens during the acute phase of infection. The lowest concentration of SARS-CoV-2 viral copies this assay can detect is 138 copies/mL. A negative result does not preclude SARS-Cov-2 infection and should not be used as the sole basis for treatment or other patient management decisions. A negative result may occur with  improper specimen collection/handling, submission of specimen other than nasopharyngeal swab, presence of viral mutation(s) within the areas targeted by this assay, and inadequate number of viral copies(<138 copies/mL). A negative result must be combined with clinical observations, patient history, and epidemiological information. The expected result is Negative.  Fact Sheet for  Patients:  BloggerCourse.com  Fact Sheet for Healthcare Providers:  SeriousBroker.it  This test is no t yet approved or cleared by the [place]  FDA and  has been authorized for detection and/or diagnosis of SARS-CoV-2 by FDA under an Emergency Use Authorization (EUA). This EUA will remain  in effect (meaning this test can be used) for the duration of the COVID-19 declaration under Section 564(b)(1) of the Act, 21 U.S.C.section 360bbb-3(b)(1), unless the authorization is terminated  or revoked sooner.       Influenza A by PCR NEGATIVE NEGATIVE Final   Influenza B by PCR NEGATIVE NEGATIVE Final    Comment: (NOTE) The Xpert Xpress SARS-CoV-2/FLU/RSV plus assay is intended as an aid in the diagnosis of influenza from Nasopharyngeal swab specimens and should not be used as a sole basis for treatment. Nasal washings and aspirates are unacceptable for Xpert Xpress SARS-CoV-2/FLU/RSV testing.  Fact Sheet for Patients: BloggerCourse.com  Fact Sheet for Healthcare Providers: SeriousBroker.it  This test is not yet approved or cleared by the [place]  FDA and has been authorized for detection and/or diagnosis of SARS-CoV-2 by FDA under an Emergency Use Authorization (EUA). This EUA will remain in effect (meaning this test can be used) for the duration of the COVID-19 declaration under Section 564(b)(1) of the Act, 21 U.S.C. section 360bbb-3(b)(1), unless the authorization is terminated or revoked.     Resp Syncytial Virus by PCR NEGATIVE NEGATIVE Final    Comment: (NOTE) Fact Sheet for Patients: BloggerCourse.com  Fact Sheet for Healthcare Providers: SeriousBroker.it  This test is not yet approved or cleared by the [place]  FDA and has been authorized for detection and/or diagnosis of SARS-CoV-2 by FDA under an Emergency  Use Authorization (EUA). This EUA will remain in effect (meaning this test can be used) for the duration of the COVID-19 declaration under Section 564(b)(1) of the Act, 21 U.S.C. section 360bbb-3(b)(1), unless the authorization is terminated or revoked.  Performed at [facility], [address] [place], [place] [address]   Urine Culture     Status: Abnormal   Collection Time: 03/02/24  8:00 PM   Specimen: Urine, Random  Result Value Ref Range Status   Specimen Description   Final    URINE, RANDOM Performed at [facility], [address] [place], [place] [address]    Special Requests   Final    NONE Reflexed from [id number] Performed at [facility], [address], [place], [place] [address]    Culture MULTIPLE SPECIES PRESENT, SUGGEST RECOLLECTION (A)  Final   Report Status 03/04/2024 FINAL  Final  MRSA Next Gen  by PCR, Nasal     Status: None   Collection Time: 03/02/24 10:00 PM   Specimen: Nasal Mucosa; Nasal Swab  Result Value Ref Range Status   MRSA by PCR Next Gen NOT DETECTED NOT DETECTED Final    Comment: (NOTE) The GeneXpert MRSA Assay (FDA approved for NASAL specimens only), is one component of a comprehensive MRSA colonization surveillance program. It is not intended to diagnose MRSA infection nor to guide or monitor treatment for MRSA infections. Test performance is not FDA approved in patients less than 19 years old. Performed at [facility], [address], [place], [place] [address]   Respiratory (~20 pathogens) panel by PCR     Status: None   Collection Time: 03/02/24 10:20 PM   Specimen: Nasopharyngeal Swab; Respiratory  Result Value Ref Range Status   Adenovirus NOT DETECTED NOT DETECTED Final   Coronavirus 229E NOT DETECTED NOT DETECTED Final    Comment: (NOTE) The Coronavirus on the Respiratory Panel, DOES NOT test for the novel  Coronavirus (2019 nCoV)    Coronavirus HKU1 NOT DETECTED NOT DETECTED Final   Coronavirus NL63 NOT DETECTED NOT DETECTED Final    Coronavirus OC43 NOT DETECTED NOT DETECTED Final   Metapneumovirus NOT DETECTED NOT DETECTED Final   Rhinovirus / Enterovirus NOT DETECTED NOT DETECTED Final   Influenza A NOT DETECTED NOT DETECTED Final   Influenza B NOT DETECTED NOT DETECTED Final   Parainfluenza Virus 1 NOT DETECTED NOT DETECTED Final   Parainfluenza Virus 2 NOT DETECTED NOT DETECTED Final   Parainfluenza Virus 3 NOT DETECTED NOT DETECTED Final   Parainfluenza Virus 4 NOT DETECTED NOT DETECTED Final   Respiratory Syncytial Virus NOT DETECTED NOT DETECTED Final   Bordetella pertussis NOT DETECTED NOT DETECTED Final   Bordetella Parapertussis NOT DETECTED NOT DETECTED Final   Chlamydophila pneumoniae NOT DETECTED NOT DETECTED Final   Mycoplasma pneumoniae NOT DETECTED NOT DETECTED Final    Comment: Performed at [facility], [address], [place], [place] [address]     Radiology Studies: ECHOCARDIOGRAM LIMITED Result Date: 03/05/2024    ECHOCARDIOGRAM LIMITED REPORT   [name] [facility] Date of Exam: 03/05/2024 Medical Rec #:  [id number]        Height:       64.0 in Accession #:    [id number]       Weight:       207.9 lb Date of Birth:  [birth year]         BSA:          1.989 m Patient Age:    73 years         BP:           108/72 mmHg Patient Gender: F                HR:           87 bpm. Exam Location:  [name] Procedure: Limited Echo and Intracardiac Opacification Agent (Both Spectral and            Color Flow Doppler were utilized during procedure). Indications:    Chest Pain R07.9  History:        Patient has prior history of Echocardiogram examinations, most                 recent 10/25/2023. CHF, CAD and Previous Myocardial Infarction,                 Prior  CABG; Risk Factors:Hypertension, Diabetes, Dyslipidemia                 and Current Smoker.  Sonographer:    Aida Pizza RCS Referring Phys: [id number] LORETTE CINDERELLA KAPUR IMPRESSIONS  1. Left ventricular ejection fraction, by estimation, is 25 to  30%. The left ventricle has severely decreased function. Wall motion abnormalities suggestive of possible stress induced cardiomyopathy (Takotsubo pattern), cannot exclude possible LAD territory ischemia.  2. The inferior vena cava is normal in size with greater than 50% respiratory variability, suggesting right atrial pressure of 3 mmHg. FINDINGS  Left Ventricle: Left ventricular ejection fraction, by estimation, is 25 to 30%. The left ventricle has severely decreased function. Definity contrast agent was given IV to delineate the left ventricular endocardial borders.  LV Wall Scoring: The entire apex is akinetic. The anterior wall and anterior septum are hypokinetic. Venous: The inferior vena cava is normal in size with greater than 50% respiratory variability, suggesting right atrial pressure of 3 mmHg. LEFT VENTRICLE PLAX 2D LVIDd:         3.70 cm LVIDs:         2.60 cm LV PW:         1.10 cm LV IVS:        1.30 cm  LV Volumes (MOD) LV vol d, MOD A2C: 81.1 ml LV vol d, MOD A4C: 86.3 ml LV vol s, MOD A2C: 57.2 ml LV vol s, MOD A4C: 61.5 ml LV SV MOD A2C:     23.9 ml LV SV MOD A4C:     86.3 ml LV SV MOD BP:      27.3 ml LEFT ATRIUM         Index LA diam:    2.60 cm 1.31 cm/m   AORTA Ao Root diam: 2.80 cm Dorn Ross MD Electronically signed by Dorn Ross MD Signature Date/Time: 03/05/2024/3:09:43 PM    Final    Scheduled Meds:  amLODipine   5 mg Oral Daily   anastrozole   1 mg Oral Daily   aspirin  EC  81 mg Oral Daily   buPROPion  150 mg Oral q morning   Chlorhexidine  Gluconate Cloth  6 each Topical Daily   DULoxetine   60 mg Oral Daily   ezetimibe   10 mg Oral Daily   gabapentin   200 mg Oral TID   guaiFENesin  1,200 mg Oral BID   heparin   5,000 Units Subcutaneous Q8H   insulin  aspart  0-20 Units Subcutaneous TID WC   insulin  aspart  0-5 Units Subcutaneous QHS   insulin  aspart  12 Units Subcutaneous TID WC   insulin  glargine  34 Units Subcutaneous Daily   metoprolol  succinate  25 mg Oral  Daily   montelukast  10 mg Oral QHS   pantoprazole   40 mg Oral Daily   rosuvastatin   10 mg Oral Daily   senna-docusate  2 tablet Oral QHS   umeclidinium bromide   1 puff Inhalation Daily   Continuous Infusions:  sodium chloride  1 mL/kg/hr (03/06/24 0624)    LOS: 4 days   Time spent: 55 mins   Brinson Tozzi Vicci, MD How to contact the [facility] Attending or Consulting provider 7A - 7P or covering provider during after hours 7P -7A, for this patient?  Check the care team in [facility] and look for a) attending/consulting [facility] provider listed and b) the [facility] team listed Log into [email or web address] to find provider on call.  Locate the [facility] provider you are looking for under [facility] and page  to a number that you can be directly reached. If you still have difficulty reaching the provider, please page the [facility] (Director on Call) for the Hospitalists listed on amion for assistance.  03/06/2024, 9:28 AM

## 2024-03-06 NOTE — Progress Notes (Signed)
 Dear Doctor:  This patient has been identified as a candidate for CVC/PICC for the following reason (s): IV therapy over 48 hours, poor veins/poor circulatory system (CHF, COPD, emphysema, diabetes, steroid use, IV drug abuse, etc.), and restarts due to phlebitis and infiltration in 24 hours If you agree, please write an order for the indicated device.   Assessment by 2 VAST RN's with veins too deep and too small to place USGPIV or midline.   Thank you for supporting the early vascular access assessment program.

## 2024-03-06 NOTE — Interval H&P Note (Signed)
 History and Physical Interval Note:  03/06/2024 3:48 PM  Katelyn Kaufman  has presented today for surgery, with the diagnosis of new reduced ejection fracture.  The various methods of treatment have been discussed with the patient and family. After consideration of risks, benefits and other options for treatment, the patient has consented to  Procedure(s): RIGHT/LEFT HEART CATH AND CORONARY/GRAFT ANGIOGRAPHY (N/A)  PERCUTANEOUS CORONARY INTERVENTION  as a surgical intervention.  The patient's history has been reviewed, patient examined, no change in status, stable for surgery.  I have reviewed the patient's chart and labs.  Questions were answered to the patient's satisfaction.    Cath Lab Visit (complete for each Cath Lab visit)  Clinical Evaluation Leading to the Procedure:   ACS: No.  Non-ACS:    Anginal Classification: CCS II  Anti-ischemic medical therapy: Maximal Therapy (2 or more classes of medications)  Non-Invasive Test Results: Equivocal test results  Prior CABG: Previous CABG     Alm Clay

## 2024-03-06 NOTE — TOC Initial Note (Addendum)
 Transition of Care [facility]) - Initial/Assessment Note    Patient Details  Name: Katelyn Kaufman MRN: [id number] Date of Birth: Jun 14, 1950  Transition of Care [facility]) CM/SW Contact:    Katelyn Saddie Kim, LCSW Phone Number: 03/06/2024, 7:40 AM  Clinical Narrative:  Assessment completed due to high risk readmission score. Pt lives with son. LCSW spoke with pt following PT evaluation. Pt aware no follow up needed and she indicates she did fairly well with them. She is aware her son in [place]  is concerned she may need assistance at home. Pt understands this would be private pay and said she has already looked into it and doesn't qualify for any other options. She states she will discuss with her son in [place] . Pt's other son stays with her to assist if needed. Per Katelyn Kaufman, pt is active with outpatient palliative services.     Update: Pt now requests LCSW call son, Katelyn Kaufman. Katelyn Kaufman would like for pt to return to [place]  to be near him but pt wants to stay here. He feels he can provide better care to pt there. Katelyn Kaufman is frustrated that family did not notify him that pt was admitted to hospital. He understands pt does not have any PT needs. He is concerned that pt needs some assistance. Katelyn Kaufman is aware this would be private pay. Family to discuss options with pt. Katelyn Kaufman also understands pt is alert and oriented x4 so pt would have to agree to plan. Pt plans to return home at this point. TOC will follow.                    Expected Discharge Plan: Home/Self Care Barriers to Discharge: Continued Medical Work up   Patient Goals and CMS Choice Patient states their goals for this hospitalization and ongoing recovery are:: return home   Choice offered to / list presented to : Patient [place] ownership interest in [facility].provided to::  (n/a)    Expected Discharge Plan and Services In-house Referral: Clinical Social Work     Living arrangements for the past 2 months: Single Family Home                                       Prior Living Arrangements/Services Living arrangements for the past 2 months: Single Family Home Lives with:: Adult Children Patient language and need for interpreter reviewed:: Yes Do you feel safe going back to the place where you live?: Yes      Need for Family Participation in Patient Care: No (Comment)     Criminal Activity/Legal Involvement Pertinent to Current Situation/Hospitalization: No - Comment as needed  Activities of Daily Living   ADL Screening (condition at time of admission) Independently performs ADLs?: No Does the patient have a NEW difficulty with bathing/dressing/toileting/self-feeding that is expected to last >3 days?: Yes (Initiates electronic notice to provider for possible OT consult) Does the patient have a NEW difficulty with getting in/out of bed, walking, or climbing stairs that is expected to last >3 days?: Yes (Initiates electronic notice to provider for possible PT consult) Does the patient have a NEW difficulty with communication that is expected to last >3 days?: No Is the patient deaf or have difficulty hearing?: No Does the patient have difficulty seeing, even when wearing glasses/contacts?: No Does the patient have difficulty concentrating, remembering, or making decisions?: Yes  Permission Sought/Granted  Emotional Assessment     Affect (typically observed): Appropriate Orientation: : Oriented to Self, Oriented to Place, Oriented to  Time, Oriented to Situation Alcohol / Substance Use: Not Applicable Psych Involvement: No (comment)  Admission diagnosis:  Dehydration [E86.0] Hyponatremia [E87.1] AKI (acute kidney injury) [N17.9] Hyperosmolar hyperglycemic state (HHS) (HCC) [E11.00] Patient Active Problem List   Diagnosis Date Noted   Hyperosmolar hyperglycemic state (HHS) (HCC) 03/02/2024   New onset of headaches 11/22/2023   Cognitive impairment 11/22/2023   AKI (acute kidney  injury) 10/26/2023   Chest pain 10/24/2023   Class 2 obesity 10/24/2023   Hot flashes related to aromatase inhibitor therapy 09/13/2023   Memory loss 05/31/2023   Chronic low back pain 05/31/2023   Breast abscess 08/17/2022   Medication management 08/17/2022   Hematoma of left breast 07/26/2022   Abscess after procedure 07/25/2022   Tobacco use disorder 07/25/2022   Thyromegaly 07/25/2022   S/P left mastectomy/H/o Lt Breast Cancer 06/25/2022   Breast cancer of upper-outer quadrant of left female breast (HCC) 12/10/2021   Acute coronary syndrome (HCC)    Type 2 diabetes mellitus with complication, with long-term current use of insulin  (HCC) 11/16/2021   Hypertension 11/16/2021   Hyperlipidemia 11/16/2021   Acute myocardial infarction involving left main coronary artery (HCC) 11/16/2021   CAD---S/P CABG x 3 in June 2023 11/16/2021   Coronary artery disease 11/16/2021   Non-ST elevation (NSTEMI) myocardial infarction [facility])    PCP:  [facility]:   [facility] [phone] - [place], [place] - [address] AT [facility] [address] [place] [address] Phone: [phone] Fax: [phone]  [facility] - [place], [place] - [address] [place] [address] Phone: [phone] Fax: [phone]  [facility] - [address] [place], [place] - [address] [place] [address] Phone: [phone] Fax: [phone]  [facility] - [place], [place] - [address] [place] [address] Phone: [phone] Fax: [phone]     Social Drivers of Health (SDOH) Social History: SDOH Screenings   Food Insecurity: No Food Insecurity (03/02/2024)  Housing: Low Risk  (03/02/2024)  Transportation Needs: No Transportation Needs (03/02/2024)  Utilities: Not At Risk (03/02/2024)  Depression (PHQ2-9): Medium Risk (02/29/2024)  Financial Resource Strain:  Low Risk  (02/10/2023)   Received from [facility]  Recent Concern: Financial Resource Strain - Medium Risk (11/24/2022)   Received from [facility]  Physical Activity: Insufficiently Active (12/30/2022)   Received from [facility]  Social Connections: Moderately Integrated (10/24/2023)  Stress: Stress Concern Present (12/14/2023)   Received from [facility]  Tobacco Use: High Risk (03/02/2024)  Health Literacy: Low Risk  (02/03/2024)   Received from [facility]   SDOH Interventions:     Readmission Risk Interventions    03/06/2024    7:38 AM 10/27/2023   10:46 AM 11/23/2021    9:39 AM  Readmission Risk Prevention Plan  Post Dischage Appt   Complete  Medication Screening   Complete  Transportation Screening Complete Complete Complete  PCP or Specialist Appt within 5-7 Days  Complete   Home Care Screening  Complete   Medication Review (RN CM)  Complete   HRI or Home Care Consult Complete    Social Work Consult for Recovery Care Planning/Counseling Complete    Palliative Care Screening Not Applicable    Medication Review (RN Care  Manager) Complete

## 2024-03-06 NOTE — H&P (View-Only) (Signed)
 Rounding Note   Patient Name: Katelyn Kaufman Date of Encounter: 03/06/2024  [facility] Cardiologist: Debera  Subjective No complaints  Scheduled Meds:  amLODipine   5 mg Oral Daily   anastrozole   1 mg Oral Daily   aspirin  EC  81 mg Oral Daily   buPROPion  150 mg Oral q morning   Chlorhexidine  Gluconate Cloth  6 each Topical Daily   DULoxetine   60 mg Oral Daily   ezetimibe   10 mg Oral Daily   gabapentin   200 mg Oral TID   guaiFENesin  1,200 mg Oral BID   heparin   5,000 Units Subcutaneous Q8H   insulin  aspart  0-20 Units Subcutaneous TID WC   insulin  aspart  0-5 Units Subcutaneous QHS   insulin  aspart  12 Units Subcutaneous TID WC   insulin  glargine  34 Units Subcutaneous Daily   metoprolol  succinate  25 mg Oral Daily   montelukast  10 mg Oral QHS   pantoprazole   40 mg Oral Daily   rosuvastatin   10 mg Oral Daily   senna-docusate  2 tablet Oral QHS   umeclidinium bromide   1 puff Inhalation Daily   Continuous Infusions:  sodium chloride  1 mL/kg/hr (03/06/24 0624)   PRN Meds: acetaminophen , dextrose , Influenza vac split trivalent PF, nitroGLYCERIN , mouth rinse   Vital Signs  Vitals:   03/05/24 1247 03/05/24 1937 03/06/24 0502 03/06/24 0800  BP: 120/72 115/68 122/80   Pulse: 87 81 77   Resp: 18 18 20    Temp: 97.9 F (36.6 C) 98 F (36.7 C) 97.7 F (36.5 C)   TempSrc: Oral Oral Oral   SpO2: 95% 93% 92% 95%  Weight:      Height:        Intake/Output Summary (Last 24 hours) at 03/06/2024 0854 Last data filed at 03/05/2024 1937 Gross per 24 hour  Intake 840 ml  Output 500 ml  Net 340 ml      03/03/2024    5:00 AM 03/02/2024    9:45 PM 03/02/2024    6:00 PM  Last 3 Weights  Weight (lbs) 207 lb 14.3 oz 201 lb 1 oz 196 lb 6.9 oz  Weight (kg) 94.3 kg 91.2 kg 89.1 kg      Telemetry NSR - Personally Reviewed  ECG  N/a - Personally Reviewed  Physical Exam  GEN: No acute distress.   Neck: No JVD Cardiac: RRR, no murmurs, rubs, or  gallops.  Respiratory: Clear to auscultation bilaterally. GI: Soft, nontender, non-distended  MS: No edema; No deformity. Neuro:  Nonfocal  Psych: Normal affect   Labs High Sensitivity Troponin:  No results for input(s): TROPONINIHS in the last 720 hours.   Chemistry Recent Labs  Lab 03/02/24 1830 03/02/24 2213 03/04/24 0018 03/05/24 0354 03/06/24 0430  NA 116*   < > 130* 135 135  K 4.4   < > 4.0 3.5 3.7  CL 80*   < > 96* 99 101  CO2 20*   < > 24 24 23   GLUCOSE 653*   < > 236* 125* 157*  BUN 47*   < > 29* 23 25*  CREATININE 3.16*   < > 1.87* 1.58* 1.65*  CALCIUM  11.6*   < > 10.2 10.4* 10.4*  MG 2.6*  --  1.9 1.9 2.0  PROT 7.3  --   --   --   --   ALBUMIN  4.1  --   --   --   --   AST 26  --   --   --   --  ALT 22  --   --   --   --   ALKPHOS 84  --   --   --   --   BILITOT 0.8  --   --   --   --   GFRNONAA 15*   < > 28* 34* 32*  ANIONGAP 15   < > 10 11 11    < > = values in this interval not displayed.    Lipids No results for input(s): CHOL, TRIG, HDL, LABVLDL, LDLCALC, CHOLHDL in the last 168 hours.  Hematology Recent Labs  Lab 03/02/24 1830 03/03/24 0255 03/04/24 0018  WBC 11.8* 13.3* 9.5  RBC 4.71 4.67 4.04  HGB 15.2* 14.9 13.0  HCT 43.0 43.7 37.5  MCV 91.3 93.6 92.8  MCH 32.3 31.9 32.2  MCHC 35.3 34.1 34.7  RDW 13.2 13.3 13.3  PLT 257 231 221   Thyroid  No results for input(s): TSH, FREET4 in the last 168 hours.  BNPNo results for input(s): BNP, PROBNP in the last 168 hours.  DDimer No results for input(s): DDIMER in the last 168 hours.   Radiology  ECHOCARDIOGRAM LIMITED Result Date: 03/05/2024    ECHOCARDIOGRAM LIMITED REPORT   [name] Date of Exam: 03/05/2024 Medical Rec #:  [id number]        Height:       64.0 in Accession #:    [id number]       Weight:       207.9 lb Date of Birth:  Jan 09, 1951         BSA:          1.989 m Patient Age:    73 years         BP:           108/72 mmHg Patient Gender: F                 HR:           87 bpm. Exam Location:  [name] Procedure: Limited Echo and Intracardiac Opacification Agent (Both Spectral and            Color Flow Doppler were utilized during procedure). Indications:    Chest Pain R07.9  History:        Patient has prior history of Echocardiogram examinations, most                 recent 10/25/2023. CHF, CAD and Previous Myocardial Infarction,                 Prior CABG; Risk Factors:Hypertension, Diabetes, Dyslipidemia                 and Current Smoker.  Sonographer:    Aida Pizza RCS Referring Phys: [id number] LORETTE CINDERELLA KAPUR IMPRESSIONS  1. Left ventricular ejection fraction, by estimation, is 25 to 30%. The left ventricle has severely decreased function. Wall motion abnormalities suggestive of possible stress induced cardiomyopathy (Takotsubo pattern), cannot exclude possible LAD territory ischemia.  2. The inferior vena cava is normal in size with greater than 50% respiratory variability, suggesting right atrial pressure of 3 mmHg. FINDINGS  Left Ventricle: Left ventricular ejection fraction, by estimation, is 25 to 30%. The left ventricle has severely decreased function. Definity contrast agent was given IV to delineate the left ventricular endocardial borders.  LV Wall Scoring: The entire apex is akinetic. The anterior wall and anterior septum are hypokinetic. Venous: The inferior vena cava is normal in size  with greater than 50% respiratory variability, suggesting right atrial pressure of 3 mmHg. LEFT VENTRICLE PLAX 2D LVIDd:         3.70 cm LVIDs:         2.60 cm LV PW:         1.10 cm LV IVS:        1.30 cm  LV Volumes (MOD) LV vol d, MOD A2C: 81.1 ml LV vol d, MOD A4C: 86.3 ml LV vol s, MOD A2C: 57.2 ml LV vol s, MOD A4C: 61.5 ml LV SV MOD A2C:     23.9 ml LV SV MOD A4C:     86.3 ml LV SV MOD BP:      27.3 ml LEFT ATRIUM         Index LA diam:    2.60 cm 1.31 cm/m   AORTA Ao Root diam: 2.80 cm Dorn Ross MD Electronically signed by Dorn Ross MD  Signature Date/Time: 03/05/2024/3:09:43 PM    Final      Patient Profile   Katelyn Kaufman is a 73 y.o. female with a hx of CAD (s/p CABG in 10/2021 LIMA to LAD, SVG to OM, and SVG to ramus intermedius), HTN, HLD, OSA, DM2, breast cancer s/p L mastectomy, depression/anxiety, chronic pain syndrome, tobacco abuse  who is being seen 03/05/2024 for the evaluation of chest pain at the request of Dr. Vicci.   Assessment & Plan   1.Chest pain/CAD - CAD with CABG in 2023 LIMA to LAD, SVG to OM, and SVG to ramus intermedius, 10/2023 echo: LVEF 60-65%, no WMAs, grade I dd, normal RV 10/2023 nuclear stress: no ischemia - EKG lateral TWIs, elevated trop to 181 trending down in setting of significant systemic illness. Presented with hyperglycemia, metabolic acidosis, lactic acidosis, hyponatremia, AKI on CKD, N/V/D, URI symptoms probable viral syndrome.  - - symptoms overall not classic for cardiac ischemia. Has some chronic tenderness to palpation over area. Recent viral syndrome including URI symptoms that may have played a role in symptoms.  echo LVEF 25-30%, apical WMAs in a Takotsubo like pattern but cannot exclude LAD ischemia.   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 Plan for RHC/LHC today - medical therapy with toprol  25mg .GIven some CKD and initial AKI on admission holding on ACE/ARB/ARNI/MRA/SGLT2i prior to cath, can consider post cath pending renal function.      2.Hyperosmolar hyperglycemic state - admitted with BG in the 600s, metabolic acidosis. Hyponatreamia, AKI on CKD, lactic acidosis.  -this was in the setting of likely viral syndrome with cough, nausea, vomiting, congestion.  - per primary team   Informed Consent   Shared Decision Making/Informed Consent The risks [stroke (1 in 1000), death (1 in 1000), kidney failure [usually temporary] (1 in 500), bleeding (1 in 200), allergic reaction [possibly serious] (1 in 200)], benefits (diagnostic support and  management of coronary artery disease) and alternatives of a cardiac catheterization were discussed in detail with Ms. Degracia and she is willing to proceed.        For questions or updates, please contact [facility] Please consult [email or web address] for contact info under       Signed, Ross Dorn, MD  03/06/2024, 8:54 AM

## 2024-03-06 NOTE — Plan of Care (Signed)

## 2024-03-07 ENCOUNTER — Encounter (HOSPITAL_COMMUNITY): Payer: Self-pay | Admitting: Cardiology

## 2024-03-07 ENCOUNTER — Telehealth (HOSPITAL_COMMUNITY): Payer: Self-pay

## 2024-03-07 ENCOUNTER — Other Ambulatory Visit: Payer: Self-pay

## 2024-03-07 ENCOUNTER — Other Ambulatory Visit (HOSPITAL_COMMUNITY): Payer: Self-pay

## 2024-03-07 ENCOUNTER — Inpatient Hospital Stay (HOSPITAL_COMMUNITY)

## 2024-03-07 DIAGNOSIS — I5041 Acute combined systolic (congestive) and diastolic (congestive) heart failure: Secondary | ICD-10-CM | POA: Diagnosis not present

## 2024-03-07 DIAGNOSIS — I428 Other cardiomyopathies: Secondary | ICD-10-CM | POA: Diagnosis not present

## 2024-03-07 DIAGNOSIS — E1100 Type 2 diabetes mellitus with hyperosmolarity without nonketotic hyperglycemic-hyperosmolar coma (NKHHC): Secondary | ICD-10-CM | POA: Diagnosis not present

## 2024-03-07 DIAGNOSIS — I25118 Atherosclerotic heart disease of native coronary artery with other forms of angina pectoris: Secondary | ICD-10-CM

## 2024-03-07 DIAGNOSIS — I5021 Acute systolic (congestive) heart failure: Secondary | ICD-10-CM | POA: Insufficient documentation

## 2024-03-07 DIAGNOSIS — I5032 Chronic diastolic (congestive) heart failure: Secondary | ICD-10-CM | POA: Insufficient documentation

## 2024-03-07 DIAGNOSIS — N179 Acute kidney failure, unspecified: Secondary | ICD-10-CM | POA: Diagnosis not present

## 2024-03-07 LAB — BASIC METABOLIC PANEL WITH GFR
Anion gap: 8 (ref 5–15)
BUN: 18 mg/dL (ref 8–23)
CO2: 22 mmol/L (ref 22–32)
Calcium: 10.1 mg/dL (ref 8.9–10.3)
Chloride: 102 mmol/L (ref 98–111)
Creatinine, Ser: 1.53 mg/dL — ABNORMAL HIGH (ref 0.44–1.00)
GFR, Estimated: 36 mL/min — ABNORMAL LOW (ref >60)
Glucose, Bld: 201 mg/dL — ABNORMAL HIGH (ref 70–99)
Potassium: 3.6 mmol/L (ref 3.5–5.1)
Sodium: 132 mmol/L — ABNORMAL LOW (ref 135–145)

## 2024-03-07 LAB — CBC
HCT: 38.1 % (ref 36.0–46.0)
Hemoglobin: 13 g/dL (ref 12.0–15.0)
MCH: 32 pg (ref 26.0–34.0)
MCHC: 34.1 g/dL (ref 30.0–36.0)
MCV: 93.8 fL (ref 80.0–100.0)
Platelets: 199 K/uL (ref 150–400)
RBC: 4.06 MIL/uL (ref 3.87–5.11)
RDW: 13.8 % (ref 11.5–15.5)
WBC: 8.3 K/uL (ref 4.0–10.5)
nRBC: 0 % (ref 0.0–0.2)

## 2024-03-07 LAB — GLUCOSE, CAPILLARY
Glucose-Capillary: 191 mg/dL — ABNORMAL HIGH (ref 70–99)
Glucose-Capillary: 197 mg/dL — ABNORMAL HIGH (ref 70–99)
Glucose-Capillary: 127 mg/dL — ABNORMAL HIGH (ref 70–99)
Glucose-Capillary: 196 mg/dL — ABNORMAL HIGH (ref 70–99)
Glucose-Capillary: 210 mg/dL — ABNORMAL HIGH (ref 70–99)

## 2024-03-07 LAB — MAGNESIUM: Magnesium: 1.7 mg/dL (ref 1.7–2.4)

## 2024-03-07 MED ORDER — SODIUM CHLORIDE 0.9% FLUSH
10.0000 mL | Freq: Two times a day (BID) | INTRAVENOUS | Status: DC
  Administered 2024-03-07 – 2024-03-09 (×4): 10 mL

## 2024-03-07 MED ORDER — GADOBUTROL 1 MMOL/ML IV SOLN
10.0000 mL | Freq: Once | INTRAVENOUS | Status: AC | PRN
  Administered 2024-03-07: 10 mL via INTRAVENOUS

## 2024-03-07 MED ORDER — INSULIN GLARGINE-YFGN 100 UNIT/ML ~~LOC~~ SOLN
34.0000 [IU] | Freq: Every day | SUBCUTANEOUS | Status: DC
  Administered 2024-03-08: 34 [IU] via SUBCUTANEOUS
  Filled 2024-03-07: qty 0.34

## 2024-03-07 MED ORDER — SODIUM CHLORIDE 0.9% FLUSH: 10.0000 mL | INTRAVENOUS | Status: DC | PRN

## 2024-03-07 MED ORDER — LORAZEPAM 0.5 MG PO TABS
0.5000 mg | ORAL_TABLET | Freq: Once | ORAL | Status: AC | PRN
  Administered 2024-03-07: 0.5 mg via ORAL
  Filled 2024-03-07: qty 1

## 2024-03-07 MED ORDER — ISOSORBIDE MONONITRATE ER 30 MG PO TB24
15.0000 mg | ORAL_TABLET | Freq: Every day | ORAL | Status: DC
  Administered 2024-03-07 – 2024-03-09 (×2): 15 mg via ORAL
  Filled 2024-03-07 (×3): qty 1

## 2024-03-07 NOTE — Progress Notes (Signed)
 PT Cancellation Note  Patient Details Name: Katelyn Kaufman MRN: [id number] DOB: 1950-12-29   Cancelled Treatment:    Reason Eval/Treat Not Completed: Patient declined, stated she didn't feel up to it. Reports she has been getting up to the bathroom.   Rodgers ORN [facility] 03/07/2024, 1:35 PM Rodgers Opal PT Acute [employer] [phone]

## 2024-03-07 NOTE — Progress Notes (Addendum)
 Rounding Note   Patient Name: Katelyn Kaufman Date of Encounter: 03/07/2024  [facility] Cardiologist: Debera  Subjective BP 110/70.  Renal function improving, creatinine 1.5 today.  She denies any chest pain or dyspnea  Scheduled Meds:  amLODipine   5 mg Oral Daily   anastrozole   1 mg Oral Daily   aspirin  EC  81 mg Oral Daily   buPROPion  150 mg Oral q morning   Chlorhexidine  Gluconate Cloth  6 each Topical Daily   DULoxetine   60 mg Oral Daily   ezetimibe   10 mg Oral Daily   gabapentin   200 mg Oral TID   guaiFENesin  1,200 mg Oral BID   heparin   5,000 Units Subcutaneous Q8H   insulin  aspart  0-20 Units Subcutaneous TID WC   insulin  aspart  0-5 Units Subcutaneous QHS   insulin  aspart  12 Units Subcutaneous TID WC   insulin  glargine  34 Units Subcutaneous Daily   metoprolol  succinate  25 mg Oral Daily   montelukast  10 mg Oral QHS   pantoprazole   40 mg Oral Daily   rosuvastatin   10 mg Oral Daily   senna-docusate  2 tablet Oral QHS   sodium chloride  flush  3 mL Intravenous Q12H   umeclidinium bromide   1 puff Inhalation Daily   Continuous Infusions:  sodium chloride      PRN Meds: sodium chloride , acetaminophen , dextrose , Influenza vac split trivalent PF, nitroGLYCERIN , mouth rinse, sodium chloride  flush   Vital Signs  Vitals:   03/06/24 2100 03/07/24 0000 03/07/24 0517 03/07/24 0750  BP:  (!) 108/55 123/61 110/70  Pulse:   62 67  Resp: 20 20 19 14   Temp: 98 F (36.7 C) 98.5 F (36.9 C) 97.8 F (36.6 C) 98.2 F (36.8 C)  TempSrc: Oral Oral Oral Oral  SpO2:  95% 95% 95%  Weight:      Height:        Intake/Output Summary (Last 24 hours) at 03/07/2024 0835 Last data filed at 03/07/2024 9185 Gross per 24 hour  Intake 715.83 ml  Output --  Net 715.83 ml      03/06/2024    6:49 PM 03/03/2024    5:00 AM 03/02/2024    9:45 PM  Last 3 Weights  Weight (lbs) 205 lb 11 oz 207 lb 14.3 oz 201 lb 1 oz  Weight (kg) 93.3 kg 94.3 kg 91.2 kg       Telemetry NSR - Personally Reviewed  ECG  N/a - Personally Reviewed  Physical Exam  GEN: No acute distress.   Neck: No JVD Cardiac: RRR, no murmurs, rubs, or gallops.  Respiratory: Clear to auscultation bilaterally. GI: Soft, nontender, non-distended  MS: No edema; No deformity. Neuro:  Nonfocal  Psych: Normal affect   Labs High Sensitivity Troponin:  No results for input(s): TROPONINIHS in the last 720 hours.   Chemistry Recent Labs  Lab 03/02/24 1830 03/02/24 2213 03/05/24 0354 03/06/24 0430 03/06/24 1612 03/06/24 1857 03/06/24 1858 03/06/24 2042 03/07/24 0551  NA 116*   < > 135 135   < > 132* 132*  --  132*  K 4.4   < > 3.5 3.7   < > 3.3* 3.3*  --  3.6  CL 80*   < > 99 101  --   --   --   --  102  CO2 20*   < > 24 23  --   --   --   --  22  GLUCOSE 653*   < >  125* 157*  --   --   --   --  201*  BUN 47*   < > 23 25*  --   --   --   --  18  CREATININE 3.16*   < > 1.58* 1.65*  --   --   --  1.47* 1.53*  CALCIUM  11.6*   < > 10.4* 10.4*  --   --   --   --  10.1  MG 2.6*   < > 1.9 2.0  --   --   --   --  1.7  PROT 7.3  --   --   --   --   --   --   --   --   ALBUMIN  4.1  --   --   --   --   --   --   --   --   AST 26  --   --   --   --   --   --   --   --   ALT 22  --   --   --   --   --   --   --   --   ALKPHOS 84  --   --   --   --   --   --   --   --   BILITOT 0.8  --   --   --   --   --   --   --   --   GFRNONAA 15*   < > 34* 32*  --   --   --  37* 36*  ANIONGAP 15   < > 11 11  --   --   --   --  8   < > = values in this interval not displayed.    Lipids No results for input(s): CHOL, TRIG, HDL, LABVLDL, LDLCALC, CHOLHDL in the last 168 hours.  Hematology Recent Labs  Lab 03/04/24 0018 03/06/24 1612 03/06/24 1858 03/06/24 2042 03/07/24 0551  WBC 9.5  --   --  8.0 8.3  RBC 4.04  --   --  4.25 4.06  HGB 13.0   < > 10.2* 13.7 13.0  HCT 37.5   < > 30.0* 40.3 38.1  MCV 92.8  --   --  94.8 93.8  MCH 32.2  --   --  32.2 32.0  MCHC 34.7  --    --  34.0 34.1  RDW 13.3  --   --  13.8 13.8  PLT 221  --   --  208 199   < > = values in this interval not displayed.   Thyroid  No results for input(s): TSH, FREET4 in the last 168 hours.  BNPNo results for input(s): BNP, PROBNP in the last 168 hours.  DDimer No results for input(s): DDIMER in the last 168 hours.   Radiology  CARDIAC CATHETERIZATION Result Date: 03/06/2024 Table formatting from the original result was not included. Images from the original result were not included.   Prox RCA lesion is 45% stenosed.   Mid LAD stent is 5% stenosed.  Dist LAD stent on is 10% stenosed.   Mid LM to Ost LAD lesion is 85% stenosed with 95% stenosed side branch in Ramus, and 99% stenosed Ost Cx to Prox Cx sidebranch.   Ost Cx to Prox Cx lesion is 99% stenosed. => This was residual from the initial stenosis.   LIMA-LAD graft was visualized  by angiography and is normal in caliber.  The graft exhibits no disease.   SVG-RI graft was visualized by angiography and is normal in caliber.  The graft exhibits no disease.   SVG graft was injected, but not visualized due to it being occluded occlusion.  Origin lesion is 100% stenosed.   LV end diastolic pressure is normal.   There is no aortic valve stenosis. Dominance: Co-dominant Severe distal LM-ostial LCx/ostial RI disease with widely patent LIMA-LAD and SVG-RI. Likely flush occlusion of SVG-OM/LPL -> leaving the large LCx-OM compromised by a 99% eccentric stenosis Consider staged protected Left Main and LCx PCI once renal function is stabilized. Widely patent native RCA Severe cardiomyopathy with cardiac output/index of 3.93 and 1.98.  However well compensated with LVEDP of 8 mmHg and PCWP of 6 mmHg. => Okay to hydrate post cath RECOMMENDATIONS   Anticipated discharge date to be determined.   Titrate GDMT for cardiomyopathy.  Unclear that occlusion of the vein graft to the OM would explain significant drop in EF. Once stabilized from a renal and CHF  standpoint, could consider protected left main-ostial LCx PCI.   Recommend Aspirin  81mg  daily for moderate CAD.    If the decision is made to proceed with LM-LCx PCI, would load with either 600 mg clopidogrel  or 180 mg Brilinta. Alm MICAEL Clay, MD, MS Alm Clay, M.D., M.S. Interventional Cardiologist [facility] Pager # [phone]  ECHOCARDIOGRAM LIMITED Result Date: 03/05/2024    ECHOCARDIOGRAM LIMITED REPORT   Patient Name:   CHANTALE LEUGERS [facility] Date of Exam: 03/05/2024 Medical Rec #:  [id number]        Height:       64.0 in Accession #:    [id number]       Weight:       207.9 lb Date of Birth:  04-Mar-1951         BSA:          1.989 m Patient Age:    73 years         BP:           108/72 mmHg Patient Gender: F                HR:           87 bpm. Exam Location:  [name] Procedure: Limited Echo and Intracardiac Opacification Agent (Both Spectral and            Color Flow Doppler were utilized during procedure). Indications:    Chest Pain R07.9  History:        Patient has prior history of Echocardiogram examinations, most                 recent 10/25/2023. CHF, CAD and Previous Myocardial Infarction,                 Prior CABG; Risk Factors:Hypertension, Diabetes, Dyslipidemia                 and Current Smoker.  Sonographer:    Aida Pizza RCS Referring Phys: [id number] LORETTE CINDERELLA KAPUR IMPRESSIONS  1. Left ventricular ejection fraction, by estimation, is 25 to 30%. The left ventricle has severely decreased function. Wall motion abnormalities suggestive of possible stress induced cardiomyopathy (Takotsubo pattern), cannot exclude possible LAD territory ischemia.  2. The inferior vena cava is normal in size with greater than 50% respiratory variability, suggesting right atrial pressure of 3 mmHg. FINDINGS  Left Ventricle: Left ventricular ejection fraction, by estimation,  is 25 to 30%. The left ventricle has severely decreased function. Definity contrast agent was given IV to delineate the left  ventricular endocardial borders.  LV Wall Scoring: The entire apex is akinetic. The anterior wall and anterior septum are hypokinetic. Venous: The inferior vena cava is normal in size with greater than 50% respiratory variability, suggesting right atrial pressure of 3 mmHg. LEFT VENTRICLE PLAX 2D LVIDd:         3.70 cm LVIDs:         2.60 cm LV PW:         1.10 cm LV IVS:        1.30 cm  LV Volumes (MOD) LV vol d, MOD A2C: 81.1 ml LV vol d, MOD A4C: 86.3 ml LV vol s, MOD A2C: 57.2 ml LV vol s, MOD A4C: 61.5 ml LV SV MOD A2C:     23.9 ml LV SV MOD A4C:     86.3 ml LV SV MOD BP:      27.3 ml LEFT ATRIUM         Index LA diam:    2.60 cm 1.31 cm/m   AORTA Ao Root diam: 2.80 cm Dorn Ross MD Electronically signed by Dorn Ross MD Signature Date/Time: 03/05/2024/3:09:43 PM    Final      Patient Profile   JONNY LONGINO is a 73 y.o. female with a hx of CAD (s/p CABG in 10/2021 LIMA to LAD, SVG to OM, and SVG to ramus intermedius), HTN, HLD, OSA, DM2, breast cancer s/p L mastectomy, depression/anxiety, chronic pain syndrome, tobacco abuse  who is being seen 03/05/2024 for the evaluation of chest pain at the request of Dr. Vicci.   Assessment & Plan   Chest pain/CAD: h/o CAD with CABG in 2023 LIMA to LAD, SVG to OM, and SVG to ramus intermedius. 10/2023 echo: LVEF 60-65%, no WMAs, grade I dd, normal RV. 10/2023 nuclear stress: no ischemia.  P/w EKG showing lateral TWIs, elevated trop to 181 trending down in setting of significant systemic illness. Presented with hyperglycemia, metabolic acidosis, lactic acidosis, hyponatremia, AKI on CKD, N/V/D, URI symptoms probable viral syndrome. Smptoms overall not classic for cardiac ischemia. Has some chronic tenderness to palpation over area. Recent viral syndrome including URI symptoms that may have played a role in symptoms. Echo LVEF 25-30%, apical WMAs in a Takotsubo like pattern but cannot exclude LAD ischemia.  - RHC/LHC showed proximal RCA 45%, mid LAD  5%, mid left main to ostial LAD 85% with 95% ramus, 99% ostial to proximal Lcx; patent LIMA-LAD and SVG-ramus but occluded SVG-OM leaving the large LCx-OM compromised by a 99% eccentric stenosis.  Recommended to consider staged protected left main and LCx PCI once renal function stabilized - Continue aspirin , statin, Zetia . LDL 38 12/2023  Acute combined heart failure: Echocardiogram with EF 25 to 30%.  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  She received dose this morning, will d/c and plan to add hydrazine/imdur  as BP tolerates - No ACE/ARB/Arni or spironolactone at this point due to AKI - Continue Toprol -XL - Cardiac MRI would be helpful to see if likely Takotsubos; if so, would likely plan to hold off on PCI for now and monitor for recovery of systolic function.  Will d/w interventional cardiology  Hyperosmolar hyperglycemic state: admitted with BG in the 600s, metabolic acidosis. Hyponatreamia, AKI on CKD, lactic  acidosis. This was in the setting of likely viral syndrome with cough, nausea, vomiting, congestion.  - per primary team  AKI: Creatinine 3.2 on presentation, in setting of HHS as above.  Improving, currently 1.5   For questions or updates, please contact [facility] Please consult [email or web address] for contact info under       Signed, Lonni LITTIE Nanas, MD  03/07/2024, 8:35 AM

## 2024-03-07 NOTE — Plan of Care (Signed)
  Problem: Clinical Measurements: Goal: Will remain free from infection Outcome: Progressing Goal: Respiratory complications will improve Outcome: Progressing Goal: Cardiovascular complication will be avoided Outcome: Progressing   Problem: Elimination: Goal: Will not experience complications related to bowel motility Outcome: Progressing Goal: Will not experience complications related to urinary retention Outcome: Progressing   Problem: Safety: Goal: Ability to remain free from injury will improve Outcome: Progressing   Problem: Tissue Perfusion: Goal: Adequacy of tissue perfusion will improve Outcome: Progressing   Problem: Education: Goal: Understanding of CV disease, CV risk reduction, and recovery process will improve Outcome: Progressing

## 2024-03-07 NOTE — Telephone Encounter (Signed)
 Pharmacy Patient Advocate Encounter  Insurance verification completed.    The patient is insured through [facility]. Patient has Medicare and is not eligible for a copay card, but may be able to apply for patient assistance or Medicare RX Payment Plan (Patient Must reach out to their plan, if eligible for payment plan), if available.    Ran test claim for Farxiga 10mg  and the current 30 day co-pay is $47.  Ran test claim for Jardiance 10mg  and the current 30 day co-pay is $47.  Ran test claim for Brillinta 90mg  and the current 30 day co-pay is $47.  Ran test claim for Entresto 24-26mg  and the current 30 day co-pay is $47.   This test claim was processed through [employer]- copay amounts may vary at other pharmacies due to [employer], or as the patient moves through the different stages of their insurance plan.

## 2024-03-07 NOTE — Progress Notes (Signed)
 OT Cancellation Note  Patient Details Name: Katelyn Kaufman MRN: [id number] DOB: 10-04-1950   Cancelled Treatment:    Reason Eval/Treat Not Completed: Patient declined, no reason specified  Ely Molt 03/07/2024, 2:26 PM

## 2024-03-07 NOTE — Care Management Important Message (Signed)
 Important Message  Patient Details  Name: Katelyn Kaufman MRN: [id number] Date of Birth: [birth year]   Important Message Given:  Yes - Medicare IM     Katelyn Kaufman 03/07/2024, 12:04 PM

## 2024-03-07 NOTE — Progress Notes (Signed)
 PROGRESS NOTE  Katelyn Kaufman FMW:[id number] DOB: 1950/11/01   PCP: [facility]  Patient is from: Home.  Uses rolling walker at baseline.  DOA: 03/02/2024 LOS: 5  Chief complaints No chief complaint on file.    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   In ED, hyperglycemic to 653. Na 116. Cr 3.16 (was 1.9 about a week prior).  WBC 11.8.  Lactic acid 3.0.  UA with glucosuria.  COVID-19, influenza and RSV PCR nonreactive.  CXR and CT abdomen and pelvis without significant finding.   Further workup revealed new cardiomyopathy with LVEF of 25 to 30% with apical WMA's, new since 10/2023.  Cardiology consulted and she was transferred to [facility] for [facility].  RHC/LHC showed proximal RCA 45%, mid LAD 5%, mid left main to ostial LAD 85% with 95% ramus, 99% ostial to proximal Lcx; patent LIMA-LAD and SVG-ramus but occluded SVG-OM leaving the large LCx-OM compromised by a 99% eccentric stenosis. Staged protected left main and LCx PCI recommended once renal function stabilized.  Cardiology evaluating further with cardiac MRI.  Subjective: Seen and examined earlier this morning.  No major events overnight or this morning.  No complaints.  Denies chest pain, shortness of breath, GI or UTI symptoms.  Objective: Vitals:   03/07/24 0000 03/07/24 0517 03/07/24 0750 03/07/24 1115  BP: (!) 108/55 123/61 110/70 107/75  Pulse:  62 67 60  Resp: 20 19 14 16   Temp: 98.5 F (36.9 C) 97.8 F (36.6 C) 98.2 F (36.8 C) 98 F (36.7 C)  TempSrc: Oral Oral Oral Oral  SpO2: 95% 95% 95% 100%  Weight:      Height:        Examination:  GENERAL: No apparent distress.  Nontoxic. HEENT: MMM.  Vision and hearing grossly  intact.  NECK: Supple.  No apparent JVD.  RESP:  No IWOB.  Fair aeration bilaterally. CVS:  RRR. Heart sounds normal.  ABD/GI/GU: BS+. Abd soft, NTND.  MSK/EXT:  Moves extremities. No apparent deformity. No edema.  SKIN: no apparent skin lesion or wound NEURO: AA.  Oriented appropriately.  No apparent focal neuro deficit. PSYCH: Calm. Normal affect.   Consultants:  Cardiology  Procedures: 10/14-R/LHC as above.  Microbiology summarized: COVID-19, influenza and RSV PCR nonreactive MRSA PCR screen nonreactive Urine culture with multiple species  Assessment and plan: Uncontrolled NIDDM-2 with HHS and hyperglycemia: A1c is 10.9%.  On glimepiride  and Actos at home.  HHS resolved.  Hyperglycemia improved. Recent Labs  Lab 03/06/24 1136 03/06/24 2216 03/07/24 0329 03/07/24 0611 03/07/24 1209  GLUCAP 114* 168* 191* 197* 127*  - Continue Lantus  34 units daily - Continue NovoLog  12 units 3 times daily with meals - Continue SSI-resistant - Further adjustment as appropriate  Chest pain/CAD: Had acute chest pain episode on presentation.  Troponin elevated to 181>> 127.  TTE with new cardiomyopathy with LVEF of 25 to 30% and apical WMA.  R/LHC as above.  A1c 10.9%.  LDL 38 on 8/18. -Cardiology planning cardiac MRI -Continue aspirin , statin, Zetia  -Optimize glycemic control.  Acute combined CHF/new ICM: TTE with LVEF of 25 to 30% and new RWMA.  Known about Takotsubo cardiomyopathy.  Appears euvolemic on exam -GDMT per cardiology-on Toprol -XL. - No ACE/ARB/Arni or spironolactone at this  point due to AKI -Continue Toprol -XL -Cardiology planning cardiac MRI  AKI on CKD-3B: Likely due to volume depletion from HHS.  Could also be cardiorenal.  No report of obstruction on CT abdomen and pelvis.  AKI seem to have resolved. Recent Labs    01/09/24 0957 01/09/24 1033 02/23/24 1252 03/02/24 1830 03/02/24 2213 03/03/24 0255 03/04/24 0018 03/05/24 0354 03/06/24 0430 03/06/24 2042  03/07/24 0551  BUN 34* 33* 39* 47* 44* 40* 29* 23 25*  --  18  CREATININE 1.88* 1.91* 1.96* 3.16* 2.78* 2.44* 1.87* 1.58* 1.65* 1.47* 1.53*  - Avoid nephrotoxic meds -Continue monitoring  Hypercalcemia: Pt being worked up by her nephrologist Dr. Rachele.  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 Rachele     Abdominal pain, nausea, vomiting: CT abdomen and pelvis without acute finding.  Resolved. Cough, Congestion-URI  Essential hypertension: Normotensive. -Toprol -XL per cardiology   Prolonged QTc: Resolved. -Minimize QT prolonging drugs -Optimize electrolytes  Lactic acidosis: Improved. - Recheck once in the morning to ensure resolution    Depression/Anxiety: Stable - Continue Cymbalta  and Wellbutrin.   Chronic pain syndrome: Stable. - Continue home Subutex .   History of left-sided breast cancer -Continue Arimidex   Pseudohyponatremia: In the setting of hyperglycemia and AKI.  Morbid obesity: Elevated BMI with diabetes, CHF, CAD, hypertension Body mass index is 35.31 kg/m.           DVT prophylaxis:  heparin  injection 5,000 Units Start: 03/06/24 2200  Code Status: Full code Family Communication: None at bedside Level of care: Telemetry Cardiac Status is: Inpatient Remains inpatient appropriate because: CAD and CHF   Final disposition: Likely home once medically stable   55 minutes with more than 50% spent in reviewing records, counseling patient/family and coordinating care.   Sch Meds:  Scheduled Meds:  anastrozole   1 mg Oral Daily   aspirin  EC  81 mg Oral Daily   buPROPion  150 mg Oral q morning   Chlorhexidine  Gluconate Cloth  6 each Topical Daily   DULoxetine   60 mg Oral Daily   ezetimibe   10 mg Oral Daily   gabapentin   200 mg Oral TID   guaiFENesin  1,200 mg Oral BID   heparin   5,000 Units Subcutaneous Q8H   insulin  aspart  0-20 Units Subcutaneous TID WC   insulin  aspart   0-5 Units Subcutaneous QHS   insulin  aspart  12 Units Subcutaneous TID WC   insulin  glargine  34 Units Subcutaneous Daily   isosorbide  mononitrate  15 mg Oral Daily   metoprolol  succinate  25 mg Oral Daily   montelukast  10 mg Oral QHS   pantoprazole   40 mg Oral Daily   rosuvastatin   10 mg Oral Daily   senna-docusate  2 tablet Oral QHS   sodium chloride  flush  3 mL Intravenous Q12H   umeclidinium bromide   1 puff Inhalation Daily   Continuous Infusions:  sodium chloride      PRN Meds:.sodium chloride , acetaminophen , dextrose , Influenza vac split trivalent PF, nitroGLYCERIN , mouth rinse, sodium chloride  flush  Antimicrobials: Anti-infectives (From admission, onward)    Start     Dose/Rate Route Frequency Ordered Stop   03/04/24 1100  doxycycline (VIBRA-TABS) tablet 100 mg  Status:  Discontinued        100 mg Oral Every 12 hours 03/04/24 1006 03/05/24 1507   03/02/24 2130  azithromycin (ZITHROMAX) 500 mg in sodium chloride  0.9 % 250 mL IVPB  Status:  Discontinued  500 mg 250 mL/hr over 60 Minutes Intravenous Every 24 hours 03/02/24 2106 03/02/24 2109   03/02/24 2130  doxycycline (VIBRAMYCIN) 100 mg in sodium chloride  0.9 % 250 mL IVPB  Status:  Discontinued        100 mg 125 mL/hr over 120 Minutes Intravenous Every 12 hours 03/02/24 2110 03/04/24 1006        I have personally reviewed the following labs and images: CBC: Recent Labs  Lab 03/02/24 1830 03/03/24 0255 03/04/24 0018 03/06/24 1612 03/06/24 1822 03/06/24 1857 03/06/24 1858 03/06/24 2042 03/07/24 0551  WBC 11.8* 13.3* 9.5  --   --   --   --  8.0 8.3  NEUTROABS 8.7*  --  5.6  --   --   --   --   --   --   HGB 15.2* 14.9 13.0   < > 10.2* 10.2* 10.2* 13.7 13.0  HCT 43.0 43.7 37.5   < > 30.0* 30.0* 30.0* 40.3 38.1  MCV 91.3 93.6 92.8  --   --   --   --  94.8 93.8  PLT 257 231 221  --   --   --   --  208 199   < > = values in this interval not displayed.   BMP &GFR Recent Labs  Lab 03/02/24 1830  03/02/24 2213 03/03/24 0255 03/04/24 0018 03/05/24 0354 03/06/24 0430 03/06/24 1612 03/06/24 1620 03/06/24 1822 03/06/24 1857 03/06/24 1858 03/06/24 2042 03/07/24 0551  NA 116*   < > 129* 130* 135 135   < > 137  137 131* 132* 132*  --  132*  K 4.4   < > 3.5 4.0 3.5 3.7   < > 3.5  3.5 3.3* 3.3* 3.3*  --  3.6  CL 80*   < > 92* 96* 99 101  --   --   --   --   --   --  102  CO2 20*   < > 23 24 24 23   --   --   --   --   --   --  22  GLUCOSE 653*   < > 146* 236* 125* 157*  --   --   --   --   --   --  201*  BUN 47*   < > 40* 29* 23 25*  --   --   --   --   --   --  18  CREATININE 3.16*   < > 2.44* 1.87* 1.58* 1.65*  --   --   --   --   --  1.47* 1.53*  CALCIUM  11.6*   < > 10.7* 10.2 10.4* 10.4*  --   --   --   --   --   --  10.1  MG 2.6*  --   --  1.9 1.9 2.0  --   --   --   --   --   --  1.7   < > = values in this interval not displayed.   Estimated Creatinine Clearance: 36.2 mL/min (A) (by C-G formula based on SCr of 1.53 mg/dL (H)). Liver & Pancreas: Recent Labs  Lab 03/02/24 1830  AST 26  ALT 22  ALKPHOS 84  BILITOT 0.8  PROT 7.3  ALBUMIN  4.1   Recent Labs  Lab 03/02/24 1830  LIPASE 22   No results for input(s): AMMONIA in the last 168 hours. Diabetic: No results for input(s): HGBA1C in the last  72 hours. Recent Labs  Lab 03/06/24 1136 03/06/24 2216 03/07/24 0329 03/07/24 0611 03/07/24 1209  GLUCAP 114* 168* 191* 197* 127*   Cardiac Enzymes: No results for input(s): CKTOTAL, CKMB, CKMBINDEX, TROPONINI in the last 168 hours. No results for input(s): PROBNP in the last 8760 hours. Coagulation Profile: No results for input(s): INR, PROTIME in the last 168 hours. Thyroid  Function Tests: No results for input(s): TSH, T4TOTAL, FREET4, T3FREE, THYROIDAB in the last 72 hours. Lipid Profile: No results for input(s): CHOL, HDL, LDLCALC, TRIG, CHOLHDL, LDLDIRECT in the last 72 hours. Anemia Panel: No results for input(s):  VITAMINB12, FOLATE, FERRITIN, TIBC, IRON, RETICCTPCT in the last 72 hours. Urine analysis:    Component Value Date/Time   COLORURINE YELLOW 03/02/2024 2000   APPEARANCEUR HAZY (A) 03/02/2024 2000   LABSPEC 1.015 03/02/2024 2000   PHURINE 5.0 03/02/2024 2000   GLUCOSEU >=500 (A) 03/02/2024 2000   HGBUR NEGATIVE 03/02/2024 2000   BILIRUBINUR NEGATIVE 03/02/2024 2000   KETONESUR NEGATIVE 03/02/2024 2000   PROTEINUR NEGATIVE 03/02/2024 2000   NITRITE NEGATIVE 03/02/2024 2000   LEUKOCYTESUR SMALL (A) 03/02/2024 2000   Sepsis Labs: Invalid input(s): PROCALCITONIN, LACTICIDVEN  Microbiology: Recent Results (from the past 240 hours)  Resp panel by RT-PCR (RSV, Flu A&B, Covid) Anterior Nasal Swab     Status: None   Collection Time: 03/02/24  7:33 PM   Specimen: Anterior Nasal Swab  Result Value Ref Range Status   SARS Coronavirus 2 by RT PCR NEGATIVE NEGATIVE Final    Comment: (NOTE) SARS-CoV-2 target nucleic acids are NOT DETECTED.  The SARS-CoV-2 RNA is generally detectable in upper respiratory specimens during the acute phase of infection. The lowest concentration of SARS-CoV-2 viral copies this assay can detect is 138 copies/mL. A negative result does not preclude SARS-Cov-2 infection and should not be used as the sole basis for treatment or other patient management decisions. A negative result may occur with  improper specimen collection/handling, submission of specimen other than nasopharyngeal swab, presence of viral mutation(s) within the areas targeted by this assay, and inadequate number of viral copies(<138 copies/mL). A negative result must be combined with clinical observations, patient history, and epidemiological information. The expected result is Negative.  Fact Sheet for Patients:  BloggerCourse.com  Fact Sheet for Healthcare Providers:  SeriousBroker.it  This test is no t yet approved or cleared  by the [place]  FDA and  has been authorized for detection and/or diagnosis of SARS-CoV-2 by FDA under an Emergency Use Authorization (EUA). This EUA will remain  in effect (meaning this test can be used) for the duration of the COVID-19 declaration under Section 564(b)(1) of the Act, 21 U.S.C.section 360bbb-3(b)(1), unless the authorization is terminated  or revoked sooner.       Influenza A by PCR NEGATIVE NEGATIVE Final   Influenza B by PCR NEGATIVE NEGATIVE Final    Comment: (NOTE) The Xpert Xpress SARS-CoV-2/FLU/RSV plus assay is intended as an aid in the diagnosis of influenza from Nasopharyngeal swab specimens and should not be used as a sole basis for treatment. Nasal washings and aspirates are unacceptable for Xpert Xpress SARS-CoV-2/FLU/RSV testing.  Fact Sheet for Patients: BloggerCourse.com  Fact Sheet for Healthcare Providers: SeriousBroker.it  This test is not yet approved or cleared by the [place]  FDA and has been authorized for detection and/or diagnosis of SARS-CoV-2 by FDA under an Emergency Use Authorization (EUA). This EUA will remain in effect (meaning this test can be used) for the duration of the COVID-19 declaration  under Section 564(b)(1) of the Act, 21 U.S.C. section 360bbb-3(b)(1), unless the authorization is terminated or revoked.     Resp Syncytial Virus by PCR NEGATIVE NEGATIVE Final    Comment: (NOTE) Fact Sheet for Patients: BloggerCourse.com  Fact Sheet for Healthcare Providers: SeriousBroker.it  This test is not yet approved or cleared by the [place]  FDA and has been authorized for detection and/or diagnosis of SARS-CoV-2 by FDA under an Emergency Use Authorization (EUA). This EUA will remain in effect (meaning this test can be used) for the duration of the COVID-19 declaration under Section 564(b)(1) of the Act, 21  U.S.C. section 360bbb-3(b)(1), unless the authorization is terminated or revoked.  Performed at [facility], [address], [place], [place] [address]   Urine Culture     Status: Abnormal   Collection Time: 03/02/24  8:00 PM   Specimen: Urine, Random  Result Value Ref Range Status   Specimen Description   Final    URINE, RANDOM Performed at [facility], [address], [place], [place] [address]    Special Requests   Final    NONE Reflexed from [phone] Performed at [facility], [address], [place], [place] [address]    Culture MULTIPLE SPECIES PRESENT, SUGGEST RECOLLECTION (A)  Final   Report Status 03/04/2024 FINAL  Final  MRSA Next Gen by PCR, Nasal     Status: None   Collection Time: 03/02/24 10:00 PM   Specimen: Nasal Mucosa; Nasal Swab  Result Value Ref Range Status   MRSA by PCR Next Gen NOT DETECTED NOT DETECTED Final    Comment: (NOTE) The GeneXpert MRSA Assay (FDA approved for NASAL specimens only), is one component of a comprehensive MRSA colonization surveillance program. It is not intended to diagnose MRSA infection nor to guide or monitor treatment for MRSA infections. Test performance is not FDA approved in patients less than 57 years old. Performed at [facility], [address] [place], [place] [address]   Respiratory (~20 pathogens) panel by PCR     Status: None   Collection Time: 03/02/24 10:20 PM   Specimen: Nasopharyngeal Swab; Respiratory  Result Value Ref Range Status   Adenovirus NOT DETECTED NOT DETECTED Final   Coronavirus 229E NOT DETECTED NOT DETECTED Final    Comment: (NOTE) The Coronavirus on the Respiratory Panel, DOES NOT test for the novel  Coronavirus (2019 nCoV)    Coronavirus HKU1 NOT DETECTED NOT DETECTED Final   Coronavirus NL63 NOT DETECTED NOT DETECTED Final   Coronavirus OC43 NOT DETECTED NOT DETECTED Final   Metapneumovirus NOT DETECTED NOT DETECTED Final   Rhinovirus / Enterovirus NOT DETECTED NOT DETECTED Final    Influenza A NOT DETECTED NOT DETECTED Final   Influenza B NOT DETECTED NOT DETECTED Final   Parainfluenza Virus 1 NOT DETECTED NOT DETECTED Final   Parainfluenza Virus 2 NOT DETECTED NOT DETECTED Final   Parainfluenza Virus 3 NOT DETECTED NOT DETECTED Final   Parainfluenza Virus 4 NOT DETECTED NOT DETECTED Final   Respiratory Syncytial Virus NOT DETECTED NOT DETECTED Final   Bordetella pertussis NOT DETECTED NOT DETECTED Final   Bordetella Parapertussis NOT DETECTED NOT DETECTED Final   Chlamydophila pneumoniae NOT DETECTED NOT DETECTED Final   Mycoplasma pneumoniae NOT DETECTED NOT DETECTED Final    Comment: Performed at [facility], [address] [place], [place] [address]    Radiology Studies: US  EKG SITE RITE Result Date: 03/07/2024 If Site Rite image not attached, placement could not be confirmed due to  current cardiac rhythm.  CARDIAC CATHETERIZATION Result Date: 03/06/2024 Table formatting from the original result was not included. Images from the original result were not included.   Prox RCA lesion is 45% stenosed.   Mid LAD stent is 5% stenosed.  Dist LAD stent on is 10% stenosed.   Mid LM to Ost LAD lesion is 85% stenosed with 95% stenosed side branch in Ramus, and 99% stenosed Ost Cx to Prox Cx sidebranch.   Ost Cx to Prox Cx lesion is 99% stenosed. => This was residual from the initial stenosis.   LIMA-LAD graft was visualized by angiography and is normal in caliber.  The graft exhibits no disease.   SVG-RI graft was visualized by angiography and is normal in caliber.  The graft exhibits no disease.   SVG graft was injected, but not visualized due to it being occluded occlusion.  Origin lesion is 100% stenosed.   LV end diastolic pressure is normal.   There is no aortic valve stenosis. Dominance: Co-dominant Severe distal LM-ostial LCx/ostial RI disease with widely patent LIMA-LAD and SVG-RI. Likely flush occlusion of SVG-OM/LPL -> leaving the large LCx-OM compromised by a  99% eccentric stenosis Consider staged protected Left Main and LCx PCI once renal function is stabilized. Widely patent native RCA Severe cardiomyopathy with cardiac output/index of 3.93 and 1.98.  However well compensated with LVEDP of 8 mmHg and PCWP of 6 mmHg. => Okay to hydrate post cath RECOMMENDATIONS   Anticipated discharge date to be determined.   Titrate GDMT for cardiomyopathy.  Unclear that occlusion of the vein graft to the OM would explain significant drop in EF. Once stabilized from a renal and CHF standpoint, could consider protected left main-ostial LCx PCI.   Recommend Aspirin  81mg  daily for moderate CAD.    If the decision is made to proceed with LM-LCx PCI, would load with either 600 mg clopidogrel  or 180 mg Brilinta. Alm MICAEL Clay, MD, MS Alm Clay, M.D., M.S. Interventional Cardiologist [facility] Pager # [phone]     Vannesa Abair T. Bionca Mckey [facility]  If 7PM-7AM, please contact night-coverage [email or web address] 03/07/2024, 12:54 PM

## 2024-03-07 NOTE — Progress Notes (Signed)
 Peripherally Inserted Central Catheter Placement  The IV Nurse has discussed with the patient and/or persons authorized to consent for the patient, the purpose of this procedure and the potential benefits and risks involved with this procedure.  The benefits include less needle sticks, lab draws from the catheter, and the patient may be discharged home with the catheter. Risks include, but not limited to, infection, bleeding, blood clot (thrombus formation), and puncture of an artery; nerve damage and irregular heartbeat and possibility to perform a PICC exchange if needed/ordered by physician.  Alternatives to this procedure were also discussed.  Bard Power PICC patient education guide, fact sheet on infection prevention and patient information card has been provided to patient /or left at bedside.    PICC Placement Documentation  PICC Single Lumen 03/07/24 Right Brachial 39 cm 0 cm (Active)  Indication for Insertion or Continuance of Line Poor Vasculature-patient has had multiple peripheral attempts or PIVs lasting less than 24 hours 03/07/24 1541  Exposed Catheter (cm) 0 cm 03/07/24 1541  Site Assessment Clean, Dry, Intact 03/07/24 1541  Line Status Flushed;Saline locked;Blood return noted 03/07/24 1541  Dressing Type Transparent;Securing device 03/07/24 1541  Dressing Status Antimicrobial disc/dressing in place;Clean, Dry, Intact 03/07/24 1541  Line Care Connections checked and tightened 03/07/24 1541  Line Adjustment (NICU/IV Team Only) No 03/07/24 1541  Dressing Intervention New dressing;Adhesive placed at insertion site (IV team only) 03/07/24 1541  Dressing Change Due 03/14/24 03/07/24 1541       Renaee Neville Skillern 03/07/2024, 3:42 PM

## 2024-03-08 ENCOUNTER — Inpatient Hospital Stay (HOSPITAL_COMMUNITY)

## 2024-03-08 ENCOUNTER — Other Ambulatory Visit: Payer: Self-pay

## 2024-03-08 DIAGNOSIS — I5181 Takotsubo syndrome: Secondary | ICD-10-CM

## 2024-03-08 DIAGNOSIS — E1100 Type 2 diabetes mellitus with hyperosmolarity without nonketotic hyperglycemic-hyperosmolar coma (NKHHC): Secondary | ICD-10-CM | POA: Diagnosis not present

## 2024-03-08 DIAGNOSIS — I25118 Atherosclerotic heart disease of native coronary artery with other forms of angina pectoris: Secondary | ICD-10-CM | POA: Diagnosis not present

## 2024-03-08 DIAGNOSIS — N179 Acute kidney failure, unspecified: Secondary | ICD-10-CM | POA: Diagnosis not present

## 2024-03-08 DIAGNOSIS — I5041 Acute combined systolic (congestive) and diastolic (congestive) heart failure: Secondary | ICD-10-CM | POA: Diagnosis not present

## 2024-03-08 LAB — CBC
HCT: 34.4 % — ABNORMAL LOW (ref 36.0–46.0)
Hemoglobin: 11.7 g/dL — ABNORMAL LOW (ref 12.0–15.0)
MCH: 32.1 pg (ref 26.0–34.0)
MCHC: 34 g/dL (ref 30.0–36.0)
MCV: 94.2 fL (ref 80.0–100.0)
Platelets: 192 K/uL (ref 150–400)
RBC: 3.65 MIL/uL — ABNORMAL LOW (ref 3.87–5.11)
RDW: 13.7 % (ref 11.5–15.5)
WBC: 7.3 K/uL (ref 4.0–10.5)
nRBC: 0 % (ref 0.0–0.2)

## 2024-03-08 LAB — TROPONIN I (HIGH SENSITIVITY)
Troponin I (High Sensitivity): 421 ng/L (ref <18)
Troponin I (High Sensitivity): 380 ng/L (ref <18)

## 2024-03-08 LAB — GLUCOSE, CAPILLARY
Glucose-Capillary: 127 mg/dL — ABNORMAL HIGH (ref 70–99)
Glucose-Capillary: 129 mg/dL — ABNORMAL HIGH (ref 70–99)
Glucose-Capillary: 170 mg/dL — ABNORMAL HIGH (ref 70–99)
Glucose-Capillary: 155 mg/dL — ABNORMAL HIGH (ref 70–99)
Glucose-Capillary: 194 mg/dL — ABNORMAL HIGH (ref 70–99)

## 2024-03-08 LAB — BASIC METABOLIC PANEL WITH GFR
Anion gap: 5 (ref 5–15)
BUN: 15 mg/dL (ref 8–23)
CO2: 23 mmol/L (ref 22–32)
Calcium: 10.3 mg/dL (ref 8.9–10.3)
Chloride: 106 mmol/L (ref 98–111)
Creatinine, Ser: 1.5 mg/dL — ABNORMAL HIGH (ref 0.44–1.00)
GFR, Estimated: 37 mL/min — ABNORMAL LOW (ref >60)
Glucose, Bld: 124 mg/dL — ABNORMAL HIGH (ref 70–99)
Potassium: 3.9 mmol/L (ref 3.5–5.1)
Sodium: 134 mmol/L — ABNORMAL LOW (ref 135–145)

## 2024-03-08 LAB — SEDIMENTATION RATE: Sed Rate: 9 mm/h (ref 0–22)

## 2024-03-08 LAB — C-REACTIVE PROTEIN: CRP: <0.5 mg/dL (ref <1.0)

## 2024-03-08 LAB — MAGNESIUM: Magnesium: 1.8 mg/dL (ref 1.7–2.4)

## 2024-03-08 MED ORDER — INSULIN ASPART 100 UNIT/ML IJ SOLN
10.0000 [IU] | Freq: Three times a day (TID) | INTRAMUSCULAR | Status: DC
  Administered 2024-03-09: 10 [IU] via SUBCUTANEOUS

## 2024-03-08 MED ORDER — FENTANYL CITRATE (PF) 50 MCG/ML IJ SOSY
25.0000 ug | PREFILLED_SYRINGE | INTRAMUSCULAR | Status: AC | PRN
Start: 2024-03-08 — End: ?

## 2024-03-08 MED ORDER — HYDRALAZINE HCL 10 MG PO TABS
10.0000 mg | ORAL_TABLET | Freq: Three times a day (TID) | ORAL | Status: AC
Start: 2024-03-08 — End: ?
  Administered 2024-03-08 – 2024-03-09 (×3): 10 mg via ORAL
  Filled 2024-03-08 (×3): qty 1

## 2024-03-08 MED ORDER — METHOCARBAMOL 1000 MG/10ML IJ SOLN
500.0000 mg | Freq: Once | INTRAMUSCULAR | Status: AC
  Administered 2024-03-08: 500 mg via INTRAVENOUS
  Filled 2024-03-08: qty 5

## 2024-03-08 MED ORDER — METHOCARBAMOL 500 MG PO TABS: 500.0000 mg | ORAL_TABLET | Freq: Three times a day (TID) | ORAL | Status: DC | PRN

## 2024-03-08 MED ORDER — ROSUVASTATIN CALCIUM 20 MG PO TABS
20.0000 mg | ORAL_TABLET | Freq: Every day | ORAL | Status: DC
  Administered 2024-03-09: 20 mg via ORAL
  Filled 2024-03-08: qty 1

## 2024-03-08 MED ORDER — OXYCODONE HCL 5 MG PO TABS: 5.0000 mg | ORAL_TABLET | Freq: Four times a day (QID) | ORAL | Status: DC | PRN

## 2024-03-08 MED ORDER — INSULIN GLARGINE-YFGN 100 UNIT/ML ~~LOC~~ SOLN
40.0000 [IU] | Freq: Every day | SUBCUTANEOUS | Status: DC
  Administered 2024-03-09: 40 [IU] via SUBCUTANEOUS
  Filled 2024-03-08: qty 0.4

## 2024-03-08 MED ORDER — ALUM & MAG HYDROXIDE-SIMETH 200-200-20 MG/5ML PO SUSP
30.0000 mL | Freq: Once | ORAL | Status: DC
  Filled 2024-03-08: qty 30

## 2024-03-08 MED ORDER — HYDROMORPHONE HCL 1 MG/ML IJ SOLN
0.5000 mg | Freq: Once | INTRAMUSCULAR | Status: AC
  Administered 2024-03-08: 0.5 mg via INTRAVENOUS
  Filled 2024-03-08: qty 1

## 2024-03-08 NOTE — Progress Notes (Signed)
 OT Cancellation Note  Patient Details Name: Katelyn Kaufman MRN: [id number] DOB: Sep 07, 1950   Cancelled Treatment:    Reason Eval/Treat Not Completed: Medical issues which prohibited therapy (Per RN, pt with chest pain throughout the afternoon with nitroglycerin  given but no change in pain at this time. Per RN request, OT to hold eval at this time and reattempt tomorrow as appropriate/available.)  Margarie Rockey HERO., OTR/L, MA Acute Rehab [phone]   Margarie FORBES Horns 03/08/2024, 2:59 PM

## 2024-03-08 NOTE — Plan of Care (Signed)
  Problem: Clinical Measurements: Goal: Will remain free from infection Outcome: Progressing Goal: Respiratory complications will improve Outcome: Progressing Goal: Cardiovascular complication will be avoided Outcome: Progressing   Problem: Nutrition: Goal: Adequate nutrition will be maintained Outcome: Progressing   Problem: Safety: Goal: Ability to remain free from injury will improve Outcome: Progressing   Problem: Respiratory: Goal: Ability to maintain adequate ventilation will improve Outcome: Progressing

## 2024-03-08 NOTE — Progress Notes (Signed)
 PT Cancellation Note  Patient Details Name: Katelyn Kaufman MRN: [id number] DOB: [birth year]   Cancelled Treatment:    Reason Eval/Treat Not Completed: (P) Fatigue/lethargy limiting ability to participate (Pt reports fatigue and declines mobility at this time. Will follow up as able.)   Darryle George 03/08/2024, 1:03 PM

## 2024-03-08 NOTE — Progress Notes (Signed)
 Patient complaining of chest pain as nurse entered room around 1220 to check on her. Patient was sitting up in bed eating lunch and having sharp, frequent spells of chest pain that would come and go roughly every 1-2 minutes. Patient complaining of pain 10/10, unable to finish eating. Dr. Kathrin notified. 3 doses of SL nitroglycerin  given per order and an EKG was obtained. No change in pain with nitroglycerin . Dr. Kathrin arrived to bedside, assessed patient, entered orders for trops, CXR, maalox and diluadid IV. Dr. Kate notified, states EKG is unchanged from prior and he will follow troponins. No new orders at this time. VSS.

## 2024-03-08 NOTE — TOC Progression Note (Signed)
 Transition of Care [facility]) - Progression Note    Patient Details  Name: Katelyn Kaufman MRN: [id number] Date of Birth: [birth year]  Transition of Care [facility]) CM/SW Contact  Waddell Barnie Rama, RN Phone Number: 03/08/2024, 10:35 AM  Clinical Narrative:    Patient is active with [facility] outpatient palliative services, physical therapy and occupational therapy will see patient today per care teams.   Expected Discharge Plan: Home/Self Care Barriers to Discharge: Continued Medical Work up               Expected Discharge Plan and Services In-house Referral: Clinical Social Work     Living arrangements for the past 2 months: Single Family Home                                       Social Drivers of Health (SDOH) Interventions SDOH Screenings   Food Insecurity: No Food Insecurity (03/02/2024)  Housing: Low Risk  (03/02/2024)  Transportation Needs: No Transportation Needs (03/02/2024)  Utilities: Not At Risk (03/02/2024)  Depression (PHQ2-9): Medium Risk (02/29/2024)  Financial Resource Strain: Low Risk  (02/10/2023)   Received [facility]  Recent Concern: Financial Resource Strain - Medium Risk (11/24/2022)   Received from [facility]  Physical Activity: Insufficiently Active (12/30/2022)   Received from [facility]  Social Connections: Moderately Integrated (10/24/2023)  Stress: Stress Concern Present (12/14/2023)   Received from [facility]  Tobacco Use: High Risk (03/02/2024)  Health Literacy: Low Risk  (02/03/2024)   Received from [facility]    Readmission Risk Interventions    03/06/2024    7:38 AM 10/27/2023   10:46 AM 11/23/2021    9:39 AM  Readmission Risk Prevention Plan  Post Dischage Appt   Complete  Medication Screening   Complete  Transportation Screening Complete Complete Complete  PCP or Specialist Appt within 5-7 Days  Complete   Home Care Screening  Complete   Medication Review (RN CM)  Complete   HRI or Home Care  Consult Complete    Social Work Consult for Recovery Care Planning/Counseling Complete    Palliative Care Screening Not Applicable    Medication Review Oceanographer) Complete

## 2024-03-08 NOTE — Progress Notes (Signed)
 Rounding Note   Patient Name: Katelyn Kaufman Date of Encounter: 03/08/2024  [facility] Cardiologist: Debera  Subjective BP 101/56.  Renal function stable at Cr 1.5.  Reported had chest pain this morning, lasted about 10 minutes, currently chest pain free  Scheduled Meds:  anastrozole   1 mg Oral Daily   aspirin  EC  81 mg Oral Daily   buPROPion  150 mg Oral q morning   Chlorhexidine  Gluconate Cloth  6 each Topical Daily   DULoxetine   60 mg Oral Daily   ezetimibe   10 mg Oral Daily   gabapentin   200 mg Oral TID   guaiFENesin  1,200 mg Oral BID   heparin   5,000 Units Subcutaneous Q8H   insulin  aspart  0-20 Units Subcutaneous TID WC   insulin  aspart  0-5 Units Subcutaneous QHS   insulin  aspart  12 Units Subcutaneous TID WC   insulin  glargine-yfgn  34 Units Subcutaneous Daily   isosorbide  mononitrate  15 mg Oral Daily   metoprolol  succinate  25 mg Oral Daily   montelukast  10 mg Oral QHS   pantoprazole   40 mg Oral Daily   rosuvastatin   10 mg Oral Daily   senna-docusate  2 tablet Oral QHS   sodium chloride  flush  10-40 mL Intracatheter Q12H   sodium chloride  flush  3 mL Intravenous Q12H   umeclidinium bromide   1 puff Inhalation Daily   Continuous Infusions:   PRN Meds: acetaminophen , dextrose , Influenza vac split trivalent PF, nitroGLYCERIN , mouth rinse, sodium chloride  flush, sodium chloride  flush   Vital Signs  Vitals:   03/07/24 1923 03/07/24 2341 03/08/24 0338 03/08/24 0823  BP: 102/65 133/69 134/69 (!) 101/56  Pulse: 73 65 (!) 58 67  Resp: 20 16 18 20   Temp: 98.6 F (37 C) 98.4 F (36.9 C) 98 F (36.7 C) 97.9 F (36.6 C)  TempSrc: Oral Oral Oral Oral  SpO2: 97% 98% 99% 98%  Weight:      Height:        Intake/Output Summary (Last 24 hours) at 03/08/2024 0941 Last data filed at 03/08/2024 0930 Gross per 24 hour  Intake 720 ml  Output 300 ml  Net 420 ml      03/06/2024    6:49 PM 03/03/2024    5:00 AM 03/02/2024    9:45 PM  Last 3  Weights  Weight (lbs) 205 lb 11 oz 207 lb 14.3 oz 201 lb 1 oz  Weight (kg) 93.3 kg 94.3 kg 91.2 kg      Telemetry NSR - Personally Reviewed  ECG  N/a - Personally Reviewed  Physical Exam  GEN: No acute distress.   Neck: No JVD Cardiac: RRR, no murmurs, rubs, or gallops.  Respiratory: Clear to auscultation bilaterally. GI: Soft, nontender, non-distended  MS: No edema; No deformity. Neuro:  Nonfocal  Psych: Normal affect   Labs High Sensitivity Troponin:  No results for input(s): TROPONINIHS in the last 720 hours.   Chemistry Recent Labs  Lab 03/02/24 1830 03/02/24 2213 03/06/24 0430 03/06/24 1612 03/06/24 1858 03/06/24 2042 03/07/24 0551 03/08/24 0500  NA 116*   < > 135   < > 132*  --  132* 134*  K 4.4   < > 3.7   < > 3.3*  --  3.6 3.9  CL 80*   < > 101  --   --   --  102 106  CO2 20*   < > 23  --   --   --  22  23  GLUCOSE 653*   < > 157*  --   --   --  201* 124*  BUN 47*   < > 25*  --   --   --  18 15  CREATININE 3.16*   < > 1.65*  --   --  1.47* 1.53* 1.50*  CALCIUM  11.6*   < > 10.4*  --   --   --  10.1 10.3  MG 2.6*   < > 2.0  --   --   --  1.7 1.8  PROT 7.3  --   --   --   --   --   --   --   ALBUMIN  4.1  --   --   --   --   --   --   --   AST 26  --   --   --   --   --   --   --   ALT 22  --   --   --   --   --   --   --   ALKPHOS 84  --   --   --   --   --   --   --   BILITOT 0.8  --   --   --   --   --   --   --   GFRNONAA 15*   < > 32*  --   --  37* 36* 37*  ANIONGAP 15   < > 11  --   --   --  8 5   < > = values in this interval not displayed.    Lipids No results for input(s): CHOL, TRIG, HDL, LABVLDL, LDLCALC, CHOLHDL in the last 168 hours.  Hematology Recent Labs  Lab 03/06/24 2042 03/07/24 0551 03/08/24 0500  WBC 8.0 8.3 7.3  RBC 4.25 4.06 3.65*  HGB 13.7 13.0 11.7*  HCT 40.3 38.1 34.4*  MCV 94.8 93.8 94.2  MCH 32.2 32.0 32.1  MCHC 34.0 34.1 34.0  RDW 13.8 13.8 13.7  PLT 208 199 192   Thyroid  No results for input(s):  TSH, FREET4 in the last 168 hours.  BNPNo results for input(s): BNP, PROBNP in the last 168 hours.  DDimer No results for input(s): DDIMER in the last 168 hours.   Radiology  MR CARDIAC MORPHOLOGY W WO CONTRAST Result Date: 03/07/2024 CLINICAL DATA:  73F with acute systolic heart failure (EF 20-25%) EXAM: CARDIAC MRI TECHNIQUE: The patient was scanned on a 1.5 Tesla Siemens magnet. A dedicated cardiac coil was used. Functional imaging was done using Fiesta sequences. 2,3, and 4 chamber views were done to assess for RWMA's. Modified Simpson's rule using a short axis stack was used to calculate an ejection fraction on a dedicated work [employer], [employer]. The patient received 10 cc of Gadavist. After 10 minutes inversion recovery sequences were used to assess for infiltration and scar tissue. Phase contrast velocity mapping was performed above the aortic and pulmonic valves CONTRAST:  10 cc  of Gadavist FINDINGS: Left ventricle: -Normal size -No hypertophy -Moderate systolic function. Normal basal function with mid to apical akinesis -Elevated ECV (33%) -Elevated T2 values in mid to apical segments -Subendocardial LGE in basal inferolateral wall LV EF:  38% (Normal 52-79%) Absolute volumes: LV EDV: (Normal 78-167 mL) LV ESV: 97mL (Normal 21-64 mL) LV SV: 61mL (Normal 52-114 mL) CO: 4.4L/min (Normal 2.7-6.3 L/min) Indexed volumes: LV EDV: 65mL/sq-m (Normal 50-96 mL/sq-m)  LV ESV: 33mL/sq-m (Normal 10-40 mL/sq-m) LV SV: 16mL/sq-m (Normal 33-64 mL/sq-m) CI: 2.1L/min/sq-m (Normal 1.9-3.9 L/min/sq-m) Right ventricle: Normal size and systolic function RV EF: 54% (Normal 52-80%) Absolute volumes: RV EDV: (Normal 79-175 mL) RV ESV: 51mL (Normal 13-75 mL) RV SV: 60mL (Normal 56-110 mL) CO: 4.3L/min (Normal 2.7-6 L/min) Indexed volumes: RV EDV: 79mL/sq-m (Normal 51-97 mL/sq-m) RV ESV: 85mL/sq-m (Normal 9-42 mL/sq-m) RV SV: 55mL/sq-m (Normal 35-61 mL/sq-m) CI: 2.1L/min/sq-m (Normal 1.8-3.8  L/min/sq-m) Left atrium: Normal size. Lipomatous hypertrophy of interatrial septum Right atrium: Normal size Mitral valve: Mild regurgitation Aortic valve: Trivial regurgitation Tricuspid valve: Mild regurgitation Pulmonic valve: Trivial regurgitation Aorta: Normal proximal ascending aorta Pericardium: Normal IMPRESSION: 1. Findings consistent with Takotsubo cardiomyopathy, with normal basal systolic function but mid to apical akinesis and elevated T2 values in mid to apical segments and no LGE in this region. 2. Subendocardial LGE in basal inferolateral wall, consistent with small infarct 3. Normal LV size, no hypertrophy, and moderate systolic dysfunction (EF 38%) 4.  Normal RV size and systolic function (EF 54%) 5.  Lipomatous hypertrophy of interatrial septum Electronically Signed   By: Lonni Nanas M.D.   On: 03/07/2024 22:37   MR CARDIAC VELOCITY FLOW MAP Result Date: 03/07/2024 CLINICAL DATA:  38F with acute systolic heart failure (EF 20-25%) EXAM: CARDIAC MRI TECHNIQUE: The patient was scanned on a 1.5 Tesla Siemens magnet. A dedicated cardiac coil was used. Functional imaging was done using Fiesta sequences. 2,3, and 4 chamber views were done to assess for RWMA's. Modified Simpson's rule using a short axis stack was used to calculate an ejection fraction on a dedicated work Research officer, trade [employer]. The patient received 10 cc of Gadavist. After 10 minutes inversion recovery sequences were used to assess for infiltration and scar tissue. Phase contrast velocity mapping was performed above the aortic and pulmonic valves CONTRAST:  10 cc  of Gadavist FINDINGS: Left ventricle: -Normal size -No hypertophy -Moderate systolic function. Normal basal function with mid to apical akinesis -Elevated ECV (33%) -Elevated T2 values in mid to apical segments -Subendocardial LGE in basal inferolateral wall LV EF:  38% (Normal 52-79%) Absolute volumes: LV EDV: (Normal 78-167 mL) LV ESV: 97mL (Normal  21-64 mL) LV SV: 61mL (Normal 52-114 mL) CO: 4.4L/min (Normal 2.7-6.3 L/min) Indexed volumes: LV EDV: 32mL/sq-m (Normal 50-96 mL/sq-m) LV ESV: 55mL/sq-m (Normal 10-40 mL/sq-m) LV SV: 82mL/sq-m (Normal 33-64 mL/sq-m) CI: 2.1L/min/sq-m (Normal 1.9-3.9 L/min/sq-m) Right ventricle: Normal size and systolic function RV EF: 54% (Normal 52-80%) Absolute volumes: RV EDV: (Normal 79-175 mL) RV ESV: 51mL (Normal 13-75 mL) RV SV: 60mL (Normal 56-110 mL) CO: 4.3L/min (Normal 2.7-6 L/min) Indexed volumes: RV EDV: 49mL/sq-m (Normal 51-97 mL/sq-m) RV ESV: 92mL/sq-m (Normal 9-42 mL/sq-m) RV SV: 65mL/sq-m (Normal 35-61 mL/sq-m) CI: 2.1L/min/sq-m (Normal 1.8-3.8 L/min/sq-m) Left atrium: Normal size. Lipomatous hypertrophy of interatrial septum Right atrium: Normal size Mitral valve: Mild regurgitation Aortic valve: Trivial regurgitation Tricuspid valve: Mild regurgitation Pulmonic valve: Trivial regurgitation Aorta: Normal proximal ascending aorta Pericardium: Normal IMPRESSION: 1. Findings consistent with Takotsubo cardiomyopathy, with normal basal systolic function but mid to apical akinesis and elevated T2 values in mid to apical segments and no LGE in this region. 2. Subendocardial LGE in basal inferolateral wall, consistent with small infarct 3. Normal LV size, no hypertrophy, and moderate systolic dysfunction (EF 38%) 4.  Normal RV size and systolic function (EF 54%) 5.  Lipomatous hypertrophy of interatrial septum Electronically Signed   By: Lonni Nanas M.D.   On: 03/07/2024  22:37   MR CARDIAC VELOCITY FLOW MAP Result Date: 03/07/2024 CLINICAL DATA:  39F with acute systolic heart failure (EF 20-25%) EXAM: CARDIAC MRI TECHNIQUE: The patient was scanned on a 1.5 Tesla Siemens magnet. A dedicated cardiac coil was used. Functional imaging was done using Fiesta sequences. 2,3, and 4 chamber views were done to assess for RWMA's. Modified Simpson's rule using a short axis stack was used to calculate an ejection  fraction on a dedicated work Research officer, trade [employer]. The patient received 10 cc of Gadavist. After 10 minutes inversion recovery sequences were used to assess for infiltration and scar tissue. Phase contrast velocity mapping was performed above the aortic and pulmonic valves CONTRAST:  10 cc  of Gadavist FINDINGS: Left ventricle: -Normal size -No hypertophy -Moderate systolic function. Normal basal function with mid to apical akinesis -Elevated ECV (33%) -Elevated T2 values in mid to apical segments -Subendocardial LGE in basal inferolateral wall LV EF:  38% (Normal 52-79%) Absolute volumes: LV EDV: (Normal 78-167 mL) LV ESV: 97mL (Normal 21-64 mL) LV SV: 61mL (Normal 52-114 mL) CO: 4.4L/min (Normal 2.7-6.3 L/min) Indexed volumes: LV EDV: 26mL/sq-m (Normal 50-96 mL/sq-m) LV ESV: 61mL/sq-m (Normal 10-40 mL/sq-m) LV SV: 24mL/sq-m (Normal 33-64 mL/sq-m) CI: 2.1L/min/sq-m (Normal 1.9-3.9 L/min/sq-m) Right ventricle: Normal size and systolic function RV EF: 54% (Normal 52-80%) Absolute volumes: RV EDV: (Normal 79-175 mL) RV ESV: 51mL (Normal 13-75 mL) RV SV: 60mL (Normal 56-110 mL) CO: 4.3L/min (Normal 2.7-6 L/min) Indexed volumes: RV EDV: 9mL/sq-m (Normal 51-97 mL/sq-m) RV ESV: 31mL/sq-m (Normal 9-42 mL/sq-m) RV SV: 45mL/sq-m (Normal 35-61 mL/sq-m) CI: 2.1L/min/sq-m (Normal 1.8-3.8 L/min/sq-m) Left atrium: Normal size. Lipomatous hypertrophy of interatrial septum Right atrium: Normal size Mitral valve: Mild regurgitation Aortic valve: Trivial regurgitation Tricuspid valve: Mild regurgitation Pulmonic valve: Trivial regurgitation Aorta: Normal proximal ascending aorta Pericardium: Normal IMPRESSION: 1. Findings consistent with Takotsubo cardiomyopathy, with normal basal systolic function but mid to apical akinesis and elevated T2 values in mid to apical segments and no LGE in this region. 2. Subendocardial LGE in basal inferolateral wall, consistent with small infarct 3. Normal LV size, no  hypertrophy, and moderate systolic dysfunction (EF 38%) 4.  Normal RV size and systolic function (EF 54%) 5.  Lipomatous hypertrophy of interatrial septum Electronically Signed   By: Lonni Nanas M.D.   On: 03/07/2024 22:37   US  EKG SITE RITE Result Date: 03/07/2024 If Site Rite image not attached, placement could not be confirmed due to current cardiac rhythm.  CARDIAC CATHETERIZATION Result Date: 03/06/2024 Table formatting from the original result was not included. Images from the original result were not included.   Prox RCA lesion is 45% stenosed.   Mid LAD stent is 5% stenosed.  Dist LAD stent on is 10% stenosed.   Mid LM to Ost LAD lesion is 85% stenosed with 95% stenosed side branch in Ramus, and 99% stenosed Ost Cx to Prox Cx sidebranch.   Ost Cx to Prox Cx lesion is 99% stenosed. => This was residual from the initial stenosis.   LIMA-LAD graft was visualized by angiography and is normal in caliber.  The graft exhibits no disease.   SVG-RI graft was visualized by angiography and is normal in caliber.  The graft exhibits no disease.   SVG graft was injected, but not visualized due to it being occluded occlusion.  Origin lesion is 100% stenosed.   LV end diastolic pressure is normal.   There is no aortic valve stenosis. Dominance: Co-dominant Severe distal LM-ostial LCx/ostial  RI disease with widely patent LIMA-LAD and SVG-RI. Likely flush occlusion of SVG-OM/LPL -> leaving the large LCx-OM compromised by a 99% eccentric stenosis Consider staged protected Left Main and LCx PCI once renal function is stabilized. Widely patent native RCA Severe cardiomyopathy with cardiac output/index of 3.93 and 1.98.  However well compensated with LVEDP of 8 mmHg and PCWP of 6 mmHg. => Okay to hydrate post cath RECOMMENDATIONS   Anticipated discharge date to be determined.   Titrate GDMT for cardiomyopathy.  Unclear that occlusion of the vein graft to the OM would explain significant drop in EF. Once  stabilized from a renal and CHF standpoint, could consider protected left main-ostial LCx PCI.   Recommend Aspirin  81mg  daily for moderate CAD.    If the decision is made to proceed with LM-LCx PCI, would load with either 600 mg clopidogrel  or 180 mg Brilinta. Alm MICAEL Clay, MD, MS Alm Clay, M.D., M.S. Interventional Cardiologist [facility] Pager # [phone]    Patient Profile   TAJE TONDREAU is a 73 y.o. female with a hx of CAD (s/p CABG in 10/2021 LIMA to LAD, SVG to OM, and SVG to ramus intermedius), HTN, HLD, OSA, DM2, breast cancer s/p L mastectomy, depression/anxiety, chronic pain syndrome, tobacco abuse  who is being seen 03/05/2024 for the evaluation of chest pain at the request of Dr. Vicci.   Assessment & Plan   Chest pain/CAD: h/o CAD with CABG in 2023 LIMA to LAD, SVG to OM, and SVG to ramus intermedius. 10/2023 echo: LVEF 60-65%, no WMAs, grade I dd, normal RV. 10/2023 nuclear stress: no ischemia.  P/w EKG showing lateral TWIs, elevated trop to 181 trending down in setting of significant systemic illness. Presented with hyperglycemia, metabolic acidosis, lactic acidosis, hyponatremia, AKI on CKD, N/V/D, URI symptoms probable viral syndrome. Smptoms overall not classic for cardiac ischemia. Has some chronic tenderness to palpation over area. Recent viral syndrome including URI symptoms that may have played a role in symptoms. Echo LVEF 25-30%, apical WMAs in a Takotsubo like pattern but cannot exclude LAD ischemia.  - RHC/LHC showed proximal RCA 45%, mid LAD 5%, mid left main to ostial LAD 85% with 95% ramus, 99% ostial to proximal Lcx; patent LIMA-LAD and SVG-ramus but occluded SVG-OM leaving the large LCx-OM compromised by a 99% eccentric stenosis.  Recommended to consider staged protected left main and LCx PCI once renal function stabilized -I discussed with Dr. Clay in interventional cardiology.  Cardiac MRI confirms Takotsubo cardiomyopathy.  Given this, would  favor holding off on PCI at this time to allow time for resolution of Takotsubo.  Would plan repeat echocardiogram in 1 month and then follow-up with Dr. Clay in clinic to consider staged PCI at that time.  CMR does show small area of scar in basal inferolateral wall, but LCx territory is viable - Continue aspirin , rosuvastatin . LDL 38 12/2023  Acute combined heart failure: Echocardiogram with EF 25 to 30%.  While does have obstructive CAD as above, wall motion abnormalities suggest Takotsubos.  Cardiac MRI on 10/15 with findings consistent with Takotsubo's cardiomyopathy, LVEF 38%, RVEF 54% - Discontinued amlodipine  to allow more room to titrate GDMT.   - No ACE/ARB/Arni or spironolactone at this point due to AKI - Continue Toprol -XL - Added Imdur  15 mg daily and hydralazine  10 mg 3 times daily  Hyperosmolar hyperglycemic state: admitted with BG in the 600s, metabolic acidosis. Hyponatreamia, AKI on CKD, lactic acidosis. This was in the setting of likely viral syndrome  with cough, nausea, vomiting, congestion.  - per primary team  AKI: Creatinine 3.2 on presentation, in setting of HHS as above.  Improving, currently 1.5   For questions or updates, please contact [facility] Please consult [email or web address] for contact info under       Signed, Lonni LITTIE Nanas, MD  03/08/2024, 9:41 AM

## 2024-03-08 NOTE — Progress Notes (Signed)
 PROGRESS NOTE  Katelyn Kaufman FMW:[id number] DOB: 1950-08-06   PCP: [facility]  Patient is from: Home.  Uses rolling walker at baseline.  DOA: 03/02/2024 LOS: 6  Chief complaints No chief complaint on file.    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   In ED, hyperglycemic to 653. Na 116. Cr 3.16 (was 1.9 about a week prior).  WBC 11.8.  Lactic acid 3.0.  UA with glucosuria.  COVID-19, influenza and RSV PCR nonreactive.  CXR and CT abdomen and pelvis without significant finding.   Further workup revealed new cardiomyopathy with LVEF of 25 to 30% with apical WMA's, new since 10/2023.  Cardiology consulted and she was transferred to [facility]  RHC/LHC showed proximal RCA 45%, mid LAD 5%, mid left main to ostial LAD 85% with 95% ramus, 99% ostial to proximal Lcx; patent LIMA-LAD and SVG-ramus but occluded SVG-OM leaving the large LCx-OM compromised by a 99% eccentric stenosis. Staged protected left main and LCx PCI recommended once renal function stabilized.  Cardiac MRI suggested Takotsubo cardiomyopathy.  Cardiology recommends repeat echocardiogram in 1 month and outpatient follow-up to consider staged PCI at that time.  Now titrating cardiac medications.   Subjective: Seen and examined earlier this morning.  No major events overnight.  She felt some chest pain that she describes as pressure-like earlier in the morning that lasted about 10 minutes.  No other associated symptoms.  She had recurrence of chest pain about noon at the same location but sharp and intermittent.  Pain is severe.  No shortness of breath, diaphoresis, nausea or vomiting.  No radiation of chest pain.  Reproducible to  palpation. Objective: Vitals:   03/08/24 0338 03/08/24 0823 03/08/24 1137 03/08/24 1233  BP: 134/69 (!) 101/56 112/78 133/80  Pulse: (!) 58 67 61   Resp: 18 20 16    Temp: 98 F (36.7 C) 97.9 F (36.6 C) 97.7 F (36.5 C)   TempSrc: Oral Oral Oral   SpO2: 99% 98% 97%   Weight:      Height:        Examination:  GENERAL: Appears to be in distress from chest pain. HEENT: MMM.  Vision and hearing grossly intact.  NECK: Supple.  No apparent JVD.  RESP:  No IWOB.  Fair aeration bilaterally. CVS:  RRR. Heart sounds normal.  Tenderness over lower sternum. ABD/GI/GU: BS+. Abd soft, NTND.  MSK/EXT:  Moves extremities. No apparent deformity. No edema.  2+ radial pulses bilaterally. SKIN: no apparent skin lesion or wound NEURO: AA.  Oriented appropriately.  No apparent focal neuro deficit. PSYCH: Calm. Normal affect.   Consultants:  Cardiology  Procedures: 10/14-R/LHC as above.  Microbiology summarized: COVID-19, influenza and RSV PCR nonreactive MRSA PCR screen nonreactive Urine culture with multiple species  Assessment and plan: Uncontrolled NIDDM-2 with HHS and hyperglycemia: A1c is 10.9%.  On glimepiride  and Actos at home.  HHS resolved.  Hyperglycemia improved. Recent Labs  Lab 03/07/24 1637 03/07/24 2128 03/08/24 0341 03/08/24 0635 03/08/24 1139  GLUCAP 196* 210* 127* 129* 170*  - Increase Lantus  from 34 to 40 units daily starting 10/17 - Decrease NovoLog  from 12 to 10 units 3 times daily with meals - Continue SSI-resistant - Further adjustment as appropriate  Chest  pain/CAD: Had acute chest pain episode on presentation.  Troponin elevated to 181>> 127.  TTE with new cardiomyopathy with LVEF of 25 to 30% and apical WMA.  R/LHC as above.  Cardiac MRI suggesting Takotsubo cardiomyopathy.  A1c 10.9%.  LDL 38 on 8/18.  Patient with recurrent chest pain but reproducible to palpation.  EKG without acute change.  CXR without significant finding on my review. -On Imdur ,  Toprol -XL, aspirin , Crestor  and Zetia  per cardiology -Trial of GI cocktail and IV Dilaudid  for pain -Follow-up for malignant chest x-ray -Check serial troponin -Optimize glycemic control.  Acute combined CHF/new ICM: TTE with LVEF of 25 to 30% and new RWMA.  Concerned about Takotsubo cardiomyopathy.  Cardiac MRI suggests Takotsubo cardiomyopathy.  Appears euvolemic on exam -GDMT per cardiology-on Toprol -XL, Imdur . -No ACE/ARB/Arni or spironolactone at this point due to AKI -Cardiology planning cardiac MRI  AKI on CKD-3B: Likely due to volume depletion from HHS.  Could also be cardiorenal.  No report of obstruction on CT abdomen and pelvis.  AKI seem to have resolved. Recent Labs    01/09/24 1033 02/23/24 1252 03/02/24 1830 03/02/24 2213 03/03/24 0255 03/04/24 0018 03/05/24 0354 03/06/24 0430 03/06/24 2042 03/07/24 0551 03/08/24 0500  BUN 33* 39* 47* 44* 40* 29* 23 25*  --  18 15  CREATININE 1.91* 1.96* 3.16* 2.78* 2.44* 1.87* 1.58* 1.65* 1.47* 1.53* 1.50*  - Avoid nephrotoxic meds -Continue monitoring  Hypercalcemia: Pt being worked up by her nephrologist Dr. Rachele.  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 Rachele     Abdominal pain, nausea, vomiting: CT abdomen and pelvis without acute finding.  Resolved. Cough, Congestion-URI  Essential hypertension: Normotensive. -Toprol -XL and Imdur  per cardiology   Prolonged QTc: Resolved. -Minimize QT prolonging drugs -Optimize electrolytes  Lactic acidosis: Improved. - Recheck once in the morning to ensure resolution    Depression/Anxiety: Stable - Continue Cymbalta  and Wellbutrin.   Chronic pain syndrome: Stable. - Continue home Subutex .   History of left-sided breast cancer -Continue Arimidex   Pseudohyponatremia: In the setting of hyperglycemia and AKI.  Morbid obesity: Elevated BMI with diabetes, CHF, CAD, hypertension Body mass index is  35.31 kg/m.           DVT prophylaxis:  heparin  injection 5,000 Units Start: 03/06/24 2200  Code Status: Full code Family Communication: None at bedside Level of care: Telemetry Cardiac Status is: Inpatient Remains inpatient appropriate because: CAD and CHF   Final disposition: Likely home once medically stable   55 minutes with more than 50% spent in reviewing records, counseling patient/family and coordinating care.   Sch Meds:  Scheduled Meds:  anastrozole   1 mg Oral Daily   aspirin  EC  81 mg Oral Daily   buPROPion  150 mg Oral q morning   Chlorhexidine  Gluconate Cloth  6 each Topical Daily   DULoxetine   60 mg Oral Daily   gabapentin   200 mg Oral TID   guaiFENesin  1,200 mg Oral BID   heparin   5,000 Units Subcutaneous Q8H   hydrALAZINE   10 mg Oral Q8H   insulin  aspart  0-20 Units Subcutaneous TID WC   insulin  aspart  0-5 Units Subcutaneous QHS   insulin  aspart  12 Units Subcutaneous TID WC   insulin  glargine-yfgn  34 Units Subcutaneous Daily   isosorbide  mononitrate  15 mg Oral Daily   metoprolol  succinate  25 mg Oral Daily   montelukast  10 mg Oral QHS  pantoprazole   40 mg Oral Daily   [START ON 03/09/2024] rosuvastatin   20 mg Oral Daily   senna-docusate  2 tablet Oral QHS   sodium chloride  flush  10-40 mL Intracatheter Q12H   sodium chloride  flush  3 mL Intravenous Q12H   umeclidinium bromide   1 puff Inhalation Daily   Continuous Infusions:   PRN Meds:.acetaminophen , dextrose , Influenza vac split trivalent PF, nitroGLYCERIN , mouth rinse, sodium chloride  flush, sodium chloride  flush  Antimicrobials: Anti-infectives (From admission, onward)    Start     Dose/Rate Route Frequency Ordered Stop   03/04/24 1100  doxycycline (VIBRA-TABS) tablet 100 mg  Status:  Discontinued        100 mg Oral Every 12 hours 03/04/24 1006 03/05/24 1507   03/02/24 2130  azithromycin (ZITHROMAX) 500 mg in sodium chloride  0.9 % 250 mL IVPB  Status:  Discontinued        500  mg 250 mL/hr over 60 Minutes Intravenous Every 24 hours 03/02/24 2106 03/02/24 2109   03/02/24 2130  doxycycline (VIBRAMYCIN) 100 mg in sodium chloride  0.9 % 250 mL IVPB  Status:  Discontinued        100 mg 125 mL/hr over 120 Minutes Intravenous Every 12 hours 03/02/24 2110 03/04/24 1006        I have personally reviewed the following labs and images: CBC: Recent Labs  Lab 03/02/24 1830 03/03/24 0255 03/04/24 0018 03/06/24 1612 03/06/24 1857 03/06/24 1858 03/06/24 2042 03/07/24 0551 03/08/24 0500  WBC 11.8* 13.3* 9.5  --   --   --  8.0 8.3 7.3  NEUTROABS 8.7*  --  5.6  --   --   --   --   --   --   HGB 15.2* 14.9 13.0   < > 10.2* 10.2* 13.7 13.0 11.7*  HCT 43.0 43.7 37.5   < > 30.0* 30.0* 40.3 38.1 34.4*  MCV 91.3 93.6 92.8  --   --   --  94.8 93.8 94.2  PLT 257 231 221  --   --   --  208 199 192   < > = values in this interval not displayed.   BMP &GFR Recent Labs  Lab 03/04/24 0018 03/05/24 0354 03/06/24 0430 03/06/24 1612 03/06/24 1822 03/06/24 1857 03/06/24 1858 03/06/24 2042 03/07/24 0551 03/08/24 0500  NA 130* 135 135   < > 131* 132* 132*  --  132* 134*  K 4.0 3.5 3.7   < > 3.3* 3.3* 3.3*  --  3.6 3.9  CL 96* 99 101  --   --   --   --   --  102 106  CO2 24 24 23   --   --   --   --   --  22 23  GLUCOSE 236* 125* 157*  --   --   --   --   --  201* 124*  BUN 29* 23 25*  --   --   --   --   --  18 15  CREATININE 1.87* 1.58* 1.65*  --   --   --   --  1.47* 1.53* 1.50*  CALCIUM  10.2 10.4* 10.4*  --   --   --   --   --  10.1 10.3  MG 1.9 1.9 2.0  --   --   --   --   --  1.7 1.8   < > = values in this interval not displayed.   Estimated Creatinine Clearance: 37 mL/min (  A) (by C-G formula based on SCr of 1.5 mg/dL (H)). Liver & Pancreas: Recent Labs  Lab 03/02/24 1830  AST 26  ALT 22  ALKPHOS 84  BILITOT 0.8  PROT 7.3  ALBUMIN  4.1   Recent Labs  Lab 03/02/24 1830  LIPASE 22   No results for input(s): AMMONIA in the last 168 hours. Diabetic: No  results for input(s): HGBA1C in the last 72 hours. Recent Labs  Lab 03/07/24 1637 03/07/24 2128 03/08/24 0341 03/08/24 0635 03/08/24 1139  GLUCAP 196* 210* 127* 129* 170*   Cardiac Enzymes: No results for input(s): CKTOTAL, CKMB, CKMBINDEX, TROPONINI in the last 168 hours. No results for input(s): PROBNP in the last 8760 hours. Coagulation Profile: No results for input(s): INR, PROTIME in the last 168 hours. Thyroid  Function Tests: No results for input(s): TSH, T4TOTAL, FREET4, T3FREE, THYROIDAB in the last 72 hours. Lipid Profile: No results for input(s): CHOL, HDL, LDLCALC, TRIG, CHOLHDL, LDLDIRECT in the last 72 hours. Anemia Panel: No results for input(s): VITAMINB12, FOLATE, FERRITIN, TIBC, IRON, RETICCTPCT in the last 72 hours. Urine analysis:    Component Value Date/Time   COLORURINE YELLOW 03/02/2024 2000   APPEARANCEUR HAZY (A) 03/02/2024 2000   LABSPEC 1.015 03/02/2024 2000   PHURINE 5.0 03/02/2024 2000   GLUCOSEU >=500 (A) 03/02/2024 2000   HGBUR NEGATIVE 03/02/2024 2000   BILIRUBINUR NEGATIVE 03/02/2024 2000   KETONESUR NEGATIVE 03/02/2024 2000   PROTEINUR NEGATIVE 03/02/2024 2000   NITRITE NEGATIVE 03/02/2024 2000   LEUKOCYTESUR SMALL (A) 03/02/2024 2000   Sepsis Labs: Invalid input(s): PROCALCITONIN, LACTICIDVEN  Microbiology: Recent Results (from the past 240 hours)  Resp panel by RT-PCR (RSV, Flu A&B, Covid) Anterior Nasal Swab     Status: None   Collection Time: 03/02/24  7:33 PM   Specimen: Anterior Nasal Swab  Result Value Ref Range Status   SARS Coronavirus 2 by RT PCR NEGATIVE NEGATIVE Final    Comment: (NOTE) SARS-CoV-2 target nucleic acids are NOT DETECTED.  The SARS-CoV-2 RNA is generally detectable in upper respiratory specimens during the acute phase of infection. The lowest concentration of SARS-CoV-2 viral copies this assay can detect is 138 copies/mL. A negative result does not  preclude SARS-Cov-2 infection and should not be used as the sole basis for treatment or other patient management decisions. A negative result may occur with  improper specimen collection/handling, submission of specimen other than nasopharyngeal swab, presence of viral mutation(s) within the areas targeted by this assay, and inadequate number of viral copies(<138 copies/mL). A negative result must be combined with clinical observations, patient history, and epidemiological information. The expected result is Negative.  Fact Sheet for Patients:  BloggerCourse.com  Fact Sheet for Healthcare Providers:  SeriousBroker.it  This test is no t yet approved or cleared by the [place]  FDA and  has been authorized for detection and/or diagnosis of SARS-CoV-2 by FDA under an Emergency Use Authorization (EUA). This EUA will remain  in effect (meaning this test can be used) for the duration of the COVID-19 declaration under Section 564(b)(1) of the Act, 21 U.S.C.section 360bbb-3(b)(1), unless the authorization is terminated  or revoked sooner.       Influenza A by PCR NEGATIVE NEGATIVE Final   Influenza B by PCR NEGATIVE NEGATIVE Final    Comment: (NOTE) The Xpert Xpress SARS-CoV-2/FLU/RSV plus assay is intended as an aid in the diagnosis of influenza from Nasopharyngeal swab specimens and should not be used as a sole basis for treatment. Nasal washings and aspirates are  unacceptable for Xpert Xpress SARS-CoV-2/FLU/RSV testing.  Fact Sheet for Patients: BloggerCourse.com  Fact Sheet for Healthcare Providers: SeriousBroker.it  This test is not yet approved or cleared by the [place]  FDA and has been authorized for detection and/or diagnosis of SARS-CoV-2 by FDA under an Emergency Use Authorization (EUA). This EUA will remain in effect (meaning this test can be used) for the  duration of the COVID-19 declaration under Section 564(b)(1) of the Act, 21 U.S.C. section 360bbb-3(b)(1), unless the authorization is terminated or revoked.     Resp Syncytial Virus by PCR NEGATIVE NEGATIVE Final    Comment: (NOTE) Fact Sheet for Patients: BloggerCourse.com  Fact Sheet for Healthcare Providers: SeriousBroker.it  This test is not yet approved or cleared by the [place]  FDA and has been authorized for detection and/or diagnosis of SARS-CoV-2 by FDA under an Emergency Use Authorization (EUA). This EUA will remain in effect (meaning this test can be used) for the duration of the COVID-19 declaration under Section 564(b)(1) of the Act, 21 U.S.C. section 360bbb-3(b)(1), unless the authorization is terminated or revoked.  Performed at [facility], [address], [place], [place] [address]   Urine Culture     Status: Abnormal   Collection Time: 03/02/24  8:00 PM   Specimen: Urine, Random  Result Value Ref Range Status   Specimen Description   Final    URINE, RANDOM Performed at [facility], [address] [place], [place] [address]    Special Requests   Final    NONE Reflexed from [id number] Performed at [facility], [address], [place], [place] [address]    Culture MULTIPLE SPECIES PRESENT, SUGGEST RECOLLECTION (A)  Final   Report Status 03/04/2024 FINAL  Final  MRSA Next Gen by PCR, Nasal     Status: None   Collection Time: 03/02/24 10:00 PM   Specimen: Nasal Mucosa; Nasal Swab  Result Value Ref Range Status   MRSA by PCR Next Gen NOT DETECTED NOT DETECTED Final    Comment: (NOTE) The GeneXpert MRSA Assay (FDA approved for NASAL specimens only), is one component of a comprehensive MRSA colonization surveillance program. It is not intended to diagnose MRSA infection nor to guide or monitor treatment for MRSA infections. Test performance is not FDA approved in patients less than 93 years old. Performed  at [facility], [address] [place], [place] [address]   Respiratory (~20 pathogens) panel by PCR     Status: None   Collection Time: 03/02/24 10:20 PM   Specimen: Nasopharyngeal Swab; Respiratory  Result Value Ref Range Status   Adenovirus NOT DETECTED NOT DETECTED Final   Coronavirus 229E NOT DETECTED NOT DETECTED Final    Comment: (NOTE) The Coronavirus on the Respiratory Panel, DOES NOT test for the novel  Coronavirus (2019 nCoV)    Coronavirus HKU1 NOT DETECTED NOT DETECTED Final   Coronavirus NL63 NOT DETECTED NOT DETECTED Final   Coronavirus OC43 NOT DETECTED NOT DETECTED Final   Metapneumovirus NOT DETECTED NOT DETECTED Final   Rhinovirus / Enterovirus NOT DETECTED NOT DETECTED Final   Influenza A NOT DETECTED NOT DETECTED Final   Influenza B NOT DETECTED NOT DETECTED Final   Parainfluenza Virus 1 NOT DETECTED NOT DETECTED Final   Parainfluenza Virus 2 NOT DETECTED NOT DETECTED Final   Parainfluenza Virus 3 NOT DETECTED NOT DETECTED Final   Parainfluenza Virus 4 NOT DETECTED NOT DETECTED Final   Respiratory Syncytial Virus NOT DETECTED NOT DETECTED Final   Bordetella pertussis NOT DETECTED NOT DETECTED  Final   Bordetella Parapertussis NOT DETECTED NOT DETECTED Final   Chlamydophila pneumoniae NOT DETECTED NOT DETECTED Final   Mycoplasma pneumoniae NOT DETECTED NOT DETECTED Final    Comment: Performed at [facility], [address], [place], [place] [address]    Radiology Studies: MR CARDIAC MORPHOLOGY W WO CONTRAST Result Date: 03/07/2024 CLINICAL DATA:  31F with acute systolic heart failure (EF 20-25%) EXAM: CARDIAC MRI TECHNIQUE: The patient was scanned on a 1.5 Tesla Siemens magnet. A dedicated cardiac coil was used. Functional imaging was done using Fiesta sequences. 2,3, and 4 chamber views were done to assess for RWMA's. Modified Simpson's rule using a short axis stack was used to calculate an ejection fraction on a dedicated work Chief Technology Officer. The patient received 10 cc of Gadavist. After 10 minutes inversion recovery sequences were used to assess for infiltration and scar tissue. Phase contrast velocity mapping was performed above the aortic and pulmonic valves CONTRAST:  10 cc  of Gadavist FINDINGS: Left ventricle: -Normal size -No hypertophy -Moderate systolic function. Normal basal function with mid to apical akinesis -Elevated ECV (33%) -Elevated T2 values in mid to apical segments -Subendocardial LGE in basal inferolateral wall LV EF:  38% (Normal 52-79%) Absolute volumes: LV EDV: (Normal 78-167 mL) LV ESV: 97mL (Normal 21-64 mL) LV SV: 61mL (Normal 52-114 mL) CO: 4.4L/min (Normal 2.7-6.3 L/min) Indexed volumes: LV EDV: 69mL/sq-m (Normal 50-96 mL/sq-m) LV ESV: 56mL/sq-m (Normal 10-40 mL/sq-m) LV SV: 26mL/sq-m (Normal 33-64 mL/sq-m) CI: 2.1L/min/sq-m (Normal 1.9-3.9 L/min/sq-m) Right ventricle: Normal size and systolic function RV EF: 54% (Normal 52-80%) Absolute volumes: RV EDV: (Normal 79-175 mL) RV ESV: 51mL (Normal 13-75 mL) RV SV: 60mL (Normal 56-110 mL) CO: 4.3L/min (Normal 2.7-6 L/min) Indexed volumes: RV EDV: 72mL/sq-m (Normal 51-97 mL/sq-m) RV ESV: 66mL/sq-m (Normal 9-42 mL/sq-m) RV SV: 69mL/sq-m (Normal 35-61 mL/sq-m) CI: 2.1L/min/sq-m (Normal 1.8-3.8 L/min/sq-m) Left atrium: Normal size. Lipomatous hypertrophy of interatrial septum Right atrium: Normal size Mitral valve: Mild regurgitation Aortic valve: Trivial regurgitation Tricuspid valve: Mild regurgitation Pulmonic valve: Trivial regurgitation Aorta: Normal proximal ascending aorta Pericardium: Normal IMPRESSION: 1. Findings consistent with Takotsubo cardiomyopathy, with normal basal systolic function but mid to apical akinesis and elevated T2 values in mid to apical segments and no LGE in this region. 2. Subendocardial LGE in basal inferolateral wall, consistent with small infarct 3. Normal LV size, no hypertrophy, and moderate systolic dysfunction (EF 38%) 4.   Normal RV size and systolic function (EF 54%) 5.  Lipomatous hypertrophy of interatrial septum Electronically Signed   By: Lonni Nanas M.D.   On: 03/07/2024 22:37   MR CARDIAC VELOCITY FLOW MAP Result Date: 03/07/2024 CLINICAL DATA:  31F with acute systolic heart failure (EF 20-25%) EXAM: CARDIAC MRI TECHNIQUE: The patient was scanned on a 1.5 Tesla Siemens magnet. A dedicated cardiac coil was used. Functional imaging was done using Fiesta sequences. 2,3, and 4 chamber views were done to assess for RWMA's. Modified Simpson's rule using a short axis stack was used to calculate an ejection fraction on a dedicated work Research officer, trade [employer]. The patient received 10 cc of Gadavist. After 10 minutes inversion recovery sequences were used to assess for infiltration and scar tissue. Phase contrast velocity mapping was performed above the aortic and pulmonic valves CONTRAST:  10 cc  of Gadavist FINDINGS: Left ventricle: -Normal size -No hypertophy -Moderate systolic function. Normal basal function with mid to apical akinesis -Elevated ECV (33%) -Elevated T2 values in mid to apical  segments -Subendocardial LGE in basal inferolateral wall LV EF:  38% (Normal 52-79%) Absolute volumes: LV EDV: (Normal 78-167 mL) LV ESV: 97mL (Normal 21-64 mL) LV SV: 61mL (Normal 52-114 mL) CO: 4.4L/min (Normal 2.7-6.3 L/min) Indexed volumes: LV EDV: 25mL/sq-m (Normal 50-96 mL/sq-m) LV ESV: 59mL/sq-m (Normal 10-40 mL/sq-m) LV SV: 20mL/sq-m (Normal 33-64 mL/sq-m) CI: 2.1L/min/sq-m (Normal 1.9-3.9 L/min/sq-m) Right ventricle: Normal size and systolic function RV EF: 54% (Normal 52-80%) Absolute volumes: RV EDV: (Normal 79-175 mL) RV ESV: 51mL (Normal 13-75 mL) RV SV: 60mL (Normal 56-110 mL) CO: 4.3L/min (Normal 2.7-6 L/min) Indexed volumes: RV EDV: 8mL/sq-m (Normal 51-97 mL/sq-m) RV ESV: 24mL/sq-m (Normal 9-42 mL/sq-m) RV SV: 59mL/sq-m (Normal 35-61 mL/sq-m) CI: 2.1L/min/sq-m (Normal 1.8-3.8 L/min/sq-m) Left  atrium: Normal size. Lipomatous hypertrophy of interatrial septum Right atrium: Normal size Mitral valve: Mild regurgitation Aortic valve: Trivial regurgitation Tricuspid valve: Mild regurgitation Pulmonic valve: Trivial regurgitation Aorta: Normal proximal ascending aorta Pericardium: Normal IMPRESSION: 1. Findings consistent with Takotsubo cardiomyopathy, with normal basal systolic function but mid to apical akinesis and elevated T2 values in mid to apical segments and no LGE in this region. 2. Subendocardial LGE in basal inferolateral wall, consistent with small infarct 3. Normal LV size, no hypertrophy, and moderate systolic dysfunction (EF 38%) 4.  Normal RV size and systolic function (EF 54%) 5.  Lipomatous hypertrophy of interatrial septum Electronically Signed   By: Lonni Nanas M.D.   On: 03/07/2024 22:37   MR CARDIAC VELOCITY FLOW MAP Result Date: 03/07/2024 CLINICAL DATA:  30F with acute systolic heart failure (EF 20-25%) EXAM: CARDIAC MRI TECHNIQUE: The patient was scanned on a 1.5 Tesla Siemens magnet. A dedicated cardiac coil was used. Functional imaging was done using Fiesta sequences. 2,3, and 4 chamber views were done to assess for RWMA's. Modified Simpson's rule using a short axis stack was used to calculate an ejection fraction on a dedicated work Research officer, trade union. The patient received 10 cc of Gadavist. After 10 minutes inversion recovery sequences were used to assess for infiltration and scar tissue. Phase contrast velocity mapping was performed above the aortic and pulmonic valves CONTRAST:  10 cc  of Gadavist FINDINGS: Left ventricle: -Normal size -No hypertophy -Moderate systolic function. Normal basal function with mid to apical akinesis -Elevated ECV (33%) -Elevated T2 values in mid to apical segments -Subendocardial LGE in basal inferolateral wall LV EF:  38% (Normal 52-79%) Absolute volumes: LV EDV: (Normal 78-167 mL) LV ESV: 97mL (Normal 21-64 mL) LV SV:  61mL (Normal 52-114 mL) CO: 4.4L/min (Normal 2.7-6.3 L/min) Indexed volumes: LV EDV: 60mL/sq-m (Normal 50-96 mL/sq-m) LV ESV: 6mL/sq-m (Normal 10-40 mL/sq-m) LV SV: 61mL/sq-m (Normal 33-64 mL/sq-m) CI: 2.1L/min/sq-m (Normal 1.9-3.9 L/min/sq-m) Right ventricle: Normal size and systolic function RV EF: 54% (Normal 52-80%) Absolute volumes: RV EDV: (Normal 79-175 mL) RV ESV: 51mL (Normal 13-75 mL) RV SV: 60mL (Normal 56-110 mL) CO: 4.3L/min (Normal 2.7-6 L/min) Indexed volumes: RV EDV: 34mL/sq-m (Normal 51-97 mL/sq-m) RV ESV: 79mL/sq-m (Normal 9-42 mL/sq-m) RV SV: 72mL/sq-m (Normal 35-61 mL/sq-m) CI: 2.1L/min/sq-m (Normal 1.8-3.8 L/min/sq-m) Left atrium: Normal size. Lipomatous hypertrophy of interatrial septum Right atrium: Normal size Mitral valve: Mild regurgitation Aortic valve: Trivial regurgitation Tricuspid valve: Mild regurgitation Pulmonic valve: Trivial regurgitation Aorta: Normal proximal ascending aorta Pericardium: Normal IMPRESSION: 1. Findings consistent with Takotsubo cardiomyopathy, with normal basal systolic function but mid to apical akinesis and elevated T2 values in mid to apical segments and no LGE in this region. 2. Subendocardial LGE in basal inferolateral wall,  consistent with small infarct 3. Normal LV size, no hypertrophy, and moderate systolic dysfunction (EF 38%) 4.  Normal RV size and systolic function (EF 54%) 5.  Lipomatous hypertrophy of interatrial septum Electronically Signed   By: Lonni Nanas M.D.   On: 03/07/2024 22:37      Kemet Nijjar T. Kamren Heskett [facility]  If 7PM-7AM, please contact night-coverage [email or web address] 03/08/2024, 1:31 PM

## 2024-03-09 ENCOUNTER — Other Ambulatory Visit (HOSPITAL_COMMUNITY): Payer: Self-pay

## 2024-03-09 DIAGNOSIS — E118 Type 2 diabetes mellitus with unspecified complications: Secondary | ICD-10-CM | POA: Diagnosis not present

## 2024-03-09 DIAGNOSIS — I5181 Takotsubo syndrome: Secondary | ICD-10-CM | POA: Diagnosis not present

## 2024-03-09 DIAGNOSIS — E66812 Obesity, class 2: Secondary | ICD-10-CM | POA: Diagnosis not present

## 2024-03-09 DIAGNOSIS — E1100 Type 2 diabetes mellitus with hyperosmolarity without nonketotic hyperglycemic-hyperosmolar coma (NKHHC): Secondary | ICD-10-CM | POA: Diagnosis not present

## 2024-03-09 DIAGNOSIS — Z951 Presence of aortocoronary bypass graft: Secondary | ICD-10-CM

## 2024-03-09 DIAGNOSIS — I5021 Acute systolic (congestive) heart failure: Secondary | ICD-10-CM | POA: Diagnosis not present

## 2024-03-09 DIAGNOSIS — I25118 Atherosclerotic heart disease of native coronary artery with other forms of angina pectoris: Secondary | ICD-10-CM | POA: Diagnosis not present

## 2024-03-09 LAB — MAGNESIUM: Magnesium: 1.8 mg/dL (ref 1.7–2.4)

## 2024-03-09 LAB — BASIC METABOLIC PANEL WITH GFR
Anion gap: 9 (ref 5–15)
BUN: 16 mg/dL (ref 8–23)
CO2: 23 mmol/L (ref 22–32)
Calcium: 10.6 mg/dL — ABNORMAL HIGH (ref 8.9–10.3)
Chloride: 104 mmol/L (ref 98–111)
Creatinine, Ser: 1.57 mg/dL — ABNORMAL HIGH (ref 0.44–1.00)
GFR, Estimated: 35 mL/min — ABNORMAL LOW (ref >60)
Glucose, Bld: 190 mg/dL — ABNORMAL HIGH (ref 70–99)
Potassium: 4.1 mmol/L (ref 3.5–5.1)
Sodium: 136 mmol/L (ref 135–145)

## 2024-03-09 LAB — GLUCOSE, CAPILLARY
Glucose-Capillary: 174 mg/dL — ABNORMAL HIGH (ref 70–99)
Glucose-Capillary: 161 mg/dL — ABNORMAL HIGH (ref 70–99)
Glucose-Capillary: 177 mg/dL — ABNORMAL HIGH (ref 70–99)

## 2024-03-09 MED ORDER — LANCET DEVICE MISC
1.0000 | Freq: Three times a day (TID) | 0 refills | Status: AC
  Filled 2024-03-09: qty 1, 30d supply, fill #0

## 2024-03-09 MED ORDER — ISOSORBIDE MONONITRATE ER 30 MG PO TB24
15.0000 mg | ORAL_TABLET | Freq: Every day | ORAL | 0 refills | Status: DC
  Filled 2024-03-09: qty 45, 90d supply, fill #0

## 2024-03-09 MED ORDER — INSUPEN PEN NEEDLES 32G X 4 MM MISC
1.0000 | Freq: Four times a day (QID) | 0 refills | Status: DC
  Filled 2024-03-09: qty 100, 25d supply, fill #0

## 2024-03-09 MED ORDER — BLOOD GLUCOSE TEST VI STRP
1.0000 | ORAL_STRIP | Freq: Three times a day (TID) | 0 refills | Status: AC
  Filled 2024-03-09: qty 100, 30d supply, fill #0

## 2024-03-09 MED ORDER — ACCU-CHEK SOFTCLIX LANCETS MISC
1.0000 | 0 refills | Status: DC
  Filled 2024-03-09: qty 100, 30d supply, fill #0

## 2024-03-09 MED ORDER — SENNOSIDES-DOCUSATE SODIUM 8.6-50 MG PO TABS: 1.0000 | ORAL_TABLET | Freq: Two times a day (BID) | ORAL | Status: DC | PRN

## 2024-03-09 MED ORDER — INSUPEN PEN NEEDLES 32G X 4 MM MISC
1.0000 | Freq: Three times a day (TID) | 0 refills | Status: DC
  Filled 2024-03-09: qty 100, fill #0

## 2024-03-09 MED ORDER — INSULIN GLARGINE 100 UNIT/ML SOLOSTAR PEN
45.0000 [IU] | PEN_INJECTOR | Freq: Every day | SUBCUTANEOUS | 11 refills | Status: AC
  Filled 2024-03-09: qty 15, 33d supply, fill #0

## 2024-03-09 MED ORDER — ROSUVASTATIN CALCIUM 20 MG PO TABS
20.0000 mg | ORAL_TABLET | Freq: Every day | ORAL | 3 refills | Status: AC
  Filled 2024-03-09: qty 90, 90d supply, fill #0

## 2024-03-09 MED ORDER — METOPROLOL SUCCINATE ER 25 MG PO TB24
25.0000 mg | ORAL_TABLET | Freq: Every day | ORAL | 0 refills | Status: DC
  Filled 2024-03-09: qty 90, 90d supply, fill #0

## 2024-03-09 MED ORDER — INSULIN LISPRO (1 UNIT DIAL) 100 UNIT/ML (KWIKPEN)
10.0000 [IU] | PEN_INJECTOR | Freq: Three times a day (TID) | SUBCUTANEOUS | 11 refills | Status: AC
  Filled 2024-03-09: qty 15, 50d supply, fill #0

## 2024-03-09 MED ORDER — HYDRALAZINE HCL 25 MG PO TABS
25.0000 mg | ORAL_TABLET | Freq: Three times a day (TID) | ORAL | 0 refills | Status: AC
  Filled 2024-03-09: qty 270, 90d supply, fill #0

## 2024-03-09 MED ORDER — HYDRALAZINE HCL 25 MG PO TABS: 25.0000 mg | ORAL_TABLET | Freq: Three times a day (TID) | ORAL | Status: DC

## 2024-03-09 MED ORDER — BLOOD GLUCOSE MONITOR SYSTEM W/DEVICE KIT
1.0000 | PACK | Freq: Three times a day (TID) | 0 refills | Status: DC
  Filled 2024-03-09: qty 1, 30d supply, fill #0

## 2024-03-09 MED FILL — Furosemide Tab 40 MG: 40.0000 mg | ORAL | 30 days supply | Qty: 30 | Fill #0 | Status: CN

## 2024-03-09 NOTE — Evaluation (Signed)
 Occupational Therapy Evaluation Patient Details Name: Katelyn Kaufman MRN: [id number] DOB: 11/20/1950 Today's Date: 03/09/2024   History of Present Illness   Pt is a 73 y.o female admitted 10/10 for  hyperosmolar hyperglycemic state. S/p R/LHC. PMH: COPD, CAD, HTN, MI, DM2, OA, arthritis, back sx, CABG x3     Clinical Impressions Pt admitted based on above, and was seen based on problem list below. PTA pt was independent with ADLs and IADLs. Today pt is at supervision level for ADLs and functional mobility. Pt primarily limited by decreased activity tolerance.  Educated pt on use of shower seat for energy conservation and safety with transfers. Also educated pt on additional purchasing methods for shower seat. Pt would benefit from further acute services to reinforce energy conservation education, but anticipate no follow up OT needs.     If plan is discharge home, recommend the following:   Assistance with cooking/housework     Functional Status Assessment   Patient has had a recent decline in their functional status and demonstrates the ability to make significant improvements in function in a reasonable and predictable amount of time.     Equipment Recommendations   Tub/shower seat      Precautions/Restrictions   Precautions Precautions: Fall Recall of Precautions/Restrictions: Intact Restrictions Weight Bearing Restrictions Per Provider Order: No     Mobility Bed Mobility Overal bed mobility: Modified Independent       General bed mobility comments: HOB slightly elevated    Transfers Overall transfer level: Needs assistance Equipment used: Rolling walker (2 wheels) Transfers: Sit to/from Stand Sit to Stand: Supervision           General transfer comment: S for safety with STS with RW      Balance Overall balance assessment: Needs assistance Sitting-balance support: Feet supported, No upper extremity supported Sitting balance-Leahy Scale:  Good Sitting balance - Comments: seated at EOB   Standing balance support: Reliant on assistive device for balance, During functional activity, Bilateral upper extremity supported Standing balance-Leahy Scale: Fair       ADL either performed or assessed with clinical judgement   ADL Overall ADL's : Needs assistance/impaired Eating/Feeding: Set up;Sitting   Grooming: Set up;Sitting     Upper Body Dressing : Set up;Sitting   Lower Body Dressing: Supervision/safety;Sit to/from stand   Toilet Transfer: Supervision/safety;Rolling walker (2 wheels);Ambulation   Toileting- Clothing Manipulation and Hygiene: Supervision/safety;Sit to/from stand       Functional mobility during ADLs: Supervision/safety;Rolling walker (2 wheels) General ADL Comments: S for safety, decreased activity tolerance     Vision Baseline Vision/History: 0 No visual deficits Patient Visual Report: No change from baseline Vision Assessment?: No apparent visual deficits            Pertinent Vitals/Pain Pain Assessment Pain Assessment: No/denies pain Pain Intervention(s): Monitored during session     Extremity/Trunk Assessment Upper Extremity Assessment Upper Extremity Assessment: Overall WFL for tasks assessed   Lower Extremity Assessment Lower Extremity Assessment: Defer to PT evaluation   Cervical / Trunk Assessment Cervical / Trunk Assessment: Normal   Communication Communication Communication: No apparent difficulties   Cognition Arousal: Alert Behavior During Therapy: WFL for tasks assessed/performed Cognition: No apparent impairments       Following commands: Intact       Cueing  General Comments   Cueing Techniques: Verbal cues  VSS on RA           Home Living Family/patient expects to be discharged to:: Private residence Living Arrangements:  Children Available Help at Discharge: Friend(s);Available 24 hours/day Type of Home: House Home Access: Level entry     Home  Layout: One level     Bathroom Shower/Tub: Chief Strategy Officer: Standard Bathroom Accessibility: Yes How Accessible: Accessible via walker Home Equipment: Rollator (4 wheels);Cane - single point;BSC/3in1          Prior Functioning/Environment Prior Level of Function : Independent/Modified Independent     Mobility Comments: Use of rollator, denies falls ADLs Comments: Reports ind, sponge bathing d/t fear of falling    OT Problem List: Impaired balance (sitting and/or standing);Cardiopulmonary status limiting activity   OT Treatment/Interventions: Self-care/ADL training;Therapeutic exercise;Energy conservation;DME and/or AE instruction;Therapeutic activities;Patient/family education;Balance training      OT Goals(Current goals can be found in the care plan section)   Acute Rehab OT Goals Patient Stated Goal: To go home OT Goal Formulation: With patient Time For Goal Achievement: 03/23/24 Potential to Achieve Goals: Good   OT Frequency:  Min 2X/week       AM-PAC OT 6 Clicks Daily Activity     Outcome Measure Help from another person eating meals?: None Help from another person taking care of personal grooming?: A Little Help from another person toileting, which includes using toliet, bedpan, or urinal?: A Little Help from another person bathing (including washing, rinsing, drying)?: A Little Help from another person to put on and taking off regular upper body clothing?: A Little Help from another person to put on and taking off regular lower body clothing?: A Little 6 Click Score: 19   End of Session Equipment Utilized During Treatment: Rolling walker (2 wheels) Nurse Communication: Mobility status  Activity Tolerance: Patient tolerated treatment well Patient left: in chair;with call bell/phone within reach  OT Visit Diagnosis: Muscle weakness (generalized) (M62.81)                Time: 8795-8783 OT Time Calculation (min): 12 min Charges:  OT  General Charges $OT Visit: 1 Visit OT Evaluation $OT Eval Moderate Complexity: 1 Mod  Merdith Adan C, OT  Acute Rehabilitation Services Office [phone] Secure chat preferred   Adrianne GORMAN [employer]vers 03/09/2024, 1:09 PM

## 2024-03-09 NOTE — Discharge Summary (Addendum)
 Physician Discharge Summary  Katelyn Kaufman FMW:[id number] DOB: 21-Sep-1950 DOA: 03/02/2024  PCP: [facility]  Admit date: 03/02/2024 Discharge date: 03/09/24  Admitted From: Home Disposition: Home Recommendations for Outpatient Follow-up:  Outpatient follow-up with PCP in 1 to 2 weeks Cardiology to arrange outpatient follow-up Check glycemic control, CMP and CBC at follow-up Please follow up on the following pending results: None  Home Health: No need identified Equipment/Devices: No need identified  Discharge Condition: Stable CODE STATUS: Full code Diet Orders (From admission, onward)     Start     Ordered   03/09/24 1046  Diet heart healthy/carb modified Room service appropriate? Yes; Fluid consistency: Thin  Diet effective now       Question Answer Comment  Diet-HS Snack? Nothing   Room service appropriate? Yes   Fluid consistency: Thin      03/09/24 1046             Follow-up Information     [place]. Schedule an appointment as soon as possible for a visit in 1 week(s).   Contact information: [address] [place] [address] [phone]         [facility] Follow up.   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    In ED, hyperglycemic to 653. Na 116. Cr 3.16 (was 1.9 about a week prior).  WBC 11.8.  Lactic acid 3.0.  UA with glucosuria.  COVID-19, influenza and RSV PCR nonreactive.  CXR and CT abdomen and pelvis without significant finding.    Further workup  revealed new cardiomyopathy with LVEF of 25 to 30% with apical WMA's, new since 10/2023.  Cardiology consulted and she was transferred to [facility] for [facility].   RHC/LHC showed proximal RCA 45%, mid LAD 5%, mid left main to ostial LAD 85% with 95% ramus, 99% ostial to proximal Lcx; patent LIMA-LAD and SVG-ramus but occluded SVG-OM leaving the large LCx-OM compromised by a 99% eccentric stenosis. Staged protected left main and LCx PCI recommended once renal function stabilized.  Cardiac MRI suggested Takotsubo cardiomyopathy.  Of note, patient also have reproducible lower chest pain suggesting some element of musculoskeletal chest pain.  On the day of discharge, patient felt well.  She is cleared for discharge by cardiology on low-dose aspirin , hydralazine , Imdur , Toprol -XL, Crestor  and as needed Lasix .  Cardiology to arrange outpatient follow-up.    In regards to poorly controlled diabetes, hemoglobin A1c 10.9%.  She is discharged on basal and short acting insulin .  Glimepiride  discontinued due to risk of hypoglycemia.  Actos discontinued due to CHF.  Prescriptions for insulin  and diabetic supplies issued.  See individual problem list below for more.   Problems addressed during this hospitalization Uncontrolled NIDDM-2 with HHS and hyperglycemia: A1c is 10.9%.  On glimepiride  and Actos at home.  HHS resolved.  Hyperglycemia improved. -Discharged on Lantus  and Humalog -Glimepiride  discontinued due to risk of hypoglycemia -Actos discontinued due to CHF   Chest pain/CAD:  both cardiac and musculoskeletal chest pain.  Troponin elevated to 181>> 127.  TTE with new cardiomyopathy with LVEF of 25 to 30% and apical WMA.  R/LHC as above.  Cardiac MRI suggesting Takotsubo cardiomyopathy.  A1c 10.9%.  LDL 38 on 8/18.  -Cleared for discharge by cardiology on Imdur , Toprol -XL, low-dose aspirin , Crestor  and hydralazine . -Glycemic control as above.   Acute combined CHF/new ICM: TTE with LVEF of 25 to 30% and new RWMA.   Concerned about Takotsubo cardiomyopathy.  Cardiac MRI suggests Takotsubo cardiomyopathy.  Appears euvolemic on exam.  -Lasix  40 mg daily as needed for weight gain more than 3 pounds 1 day or 5 pounds in 1 week  -GDMT as above. -No ACE/ARB/Arni or spironolactone at this point due to AKI - Cardiology to arrange outpatient follow-up for repeat echo and possible staged PCI   AKI on CKD-3B: Multifactorial including volume depletion from HHS, cardiorenal etiology from CHF, ARB and HCTZ. No report of obstruction on CT abdomen and pelvis.  AKI seem to have resolved.  HCTZ and ARB discontinued.  Addressing diabetes and CHF as above. - Recheck renal function at follow-up   Hypercalcemia: Pt being worked up by her nephrologist Dr. Rachele.  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  Patient is also on HCTZ and vitamin D  supplementation which might be contributing. -HCTZ and vitamin D  discontinued -Further work up and management per Dr. Rachele   Addendum -Called and advised patient to stop taking vitamin D  few hours after discharge.    Abdominal pain, nausea, vomiting: CT abdomen and pelvis without acute finding.  Resolved. Cough, Congestion-URI   Essential hypertension: Normotensive. - Cardiac meds as above.   Prolonged QTc: Resolved. -Minimize QT prolonging drugs -Optimize electrolytes   Lactic acidosis: Improved. -Recheck once in the morning to ensure resolution    Depression/Anxiety: Stable - Continue Cymbalta  and Wellbutrin.   Chronic pain syndrome: Stable. - Continue home Subutex .   History of left-sided breast cancer -Continue Arimidex    Pseudohyponatremia: In the setting of hyperglycemia and AKI.   Morbid obesity:  Body mass index is 37.05 kg/m.           Consultations: Cardiology  Time spent 35  minutes  Vital signs Vitals:   03/09/24 0121 03/09/24 0340 03/09/24 0711 03/09/24 1052  BP:  119/69 (!) 112/50 122/78  Pulse:  64 80  69  Temp:  98.5 F (36.9 C) 97.8 F (36.6 C) 97.9 F (36.6 C)  Resp:  17 18 18   Height:      Weight: 97.9 kg     SpO2:  95% 96% 96%  TempSrc:  Oral Oral Oral  BMI (Calculated): 37.03        Discharge exam  GENERAL: No apparent distress.  Nontoxic. HEENT: MMM.  Vision and hearing grossly intact.  NECK: Supple.  No apparent JVD.  RESP:  No IWOB.  Fair aeration bilaterally. CVS:  RRR. Heart sounds normal.  ABD/GI/GU: BS+. Abd soft, NTND.  MSK/EXT:  Moves extremities. No apparent deformity. No edema.  SKIN: no apparent skin lesion or wound NEURO: Awake and alert. Oriented appropriately.  No apparent focal neuro deficit. PSYCH: Calm. Normal affect.   Discharge Instructions Discharge Instructions     Discharge instructions   Complete by: As directed    It has been a pleasure taking care of you!  You were hospitalized due to uncontrolled diabetes, chest pain and heart failure.  You have been started on insulin  for your diabetes.  Please be important that you use the insulin  as prescribed.  We have stopped  your glimepiride  and Actos.  In addition to taking your medications, is very important that you watch your diet and avoid food high in sugar and carbohydrates.  See a separate instruction about diabetes and diet.  Follow-up with your primary care doctor as soon as possible.  In regards to your chest pain and heart failure, you have been started on medication as recommended by cardiology.  Please review your new medication list and the directions on your medications before you take them.  Follow-up with cardiology per their recommendation.   Take care,   Increase activity slowly   Complete by: As directed       Allergies as of 03/09/2024       Reactions   Latex Rash, Dermatitis   Metformin  And Related Diarrhea        Medication List     STOP taking these medications    amLODipine  5 MG tablet Commonly known as: NORVASC    celecoxib  50 MG capsule Commonly known as:  CELEBREX    cholecalciferol  25 MCG (1000 UNIT) tablet Commonly known as: VITAMIN D3   glimepiride  4 MG tablet Commonly known as: AMARYL    hydrochlorothiazide 25 MG tablet Commonly known as: HYDRODIURIL   losartan  100 MG tablet Commonly known as: COZAAR    pioglitazone 15 MG tablet Commonly known as: ACTOS   potassium chloride  SA 20 MEQ tablet Commonly known as: KLOR-CON  M   Robitussin Cough+Chest Cong DM 20-400 MG/20ML Liqd Generic drug: Dextromethorphan-guaiFENesin       TAKE these medications    Accu-Chek Guide Test test strip Generic drug: glucose blood Use 1 each in the morning, at noon, and at bedtime.   Accu-Chek Guide w/Device Kit Use 1 each  in the morning, at noon, and at bedtime.   Accu-Chek Softclix Lancets lancets Use 1 each as directed. Use up to four times daily as directed.   acetaminophen  325 MG tablet Commonly known as: TYLENOL  Take 2 tablets (650 mg total) by mouth every 6 (six) hours as needed for mild pain (or Fever >/= 101).   anastrozole  1 MG tablet Commonly known as: ARIMIDEX  TAKE 1 TABLET(1 MG) BY MOUTH DAILY   aspirin  EC 81 MG tablet Take 1 tablet (81 mg total) by mouth daily. Swallow whole.   buPROPion 150 MG 24 hr tablet Commonly known as: WELLBUTRIN XL Take 150 mg by mouth every morning.   DULoxetine  60 MG capsule Commonly known as: CYMBALTA  Take 60 mg by mouth daily.   ezetimibe  10 MG tablet Commonly known as: ZETIA  Take 1 tablet (10 mg total) by mouth daily.   fexofenadine 180 MG tablet Commonly known as: ALLEGRA Take 180 mg by mouth daily.   furosemide  40 MG tablet Commonly known as: LASIX  Take 1 tablet (40 mg total) by mouth daily as needed (for weight gains more than 3 pounds in 1 day or 5 pounds in 1 week.). What changed: See the new instructions.   gabapentin  300 MG capsule Commonly known as: NEURONTIN  Take 300 mg by mouth 3 (three) times daily.   HumaLOG KwikPen 100 UNIT/ML KwikPen Generic drug: insulin   lispro Inject 10 Units into the skin 3 (three) times daily.   hydrALAZINE  25 MG tablet Commonly known as: APRESOLINE  Take 1 tablet (25 mg total) by mouth every 8 (eight) hours.   Insupen Pen Needles 32G X 4 MM Misc Generic drug: Insulin  Pen Needle Use to inject insulin  into the skin up to 4 times daily as directed.   Insupen Pen Needles 32G X 4  MM Misc Generic drug: Insulin  Pen Needle Inject 1 each into the skin 4 (four) times daily -  before meals and at bedtime.   isosorbide  mononitrate 30 MG 24 hr tablet Commonly known as: IMDUR  Take 0.5 tablets (15 mg total) by mouth daily. Start taking on: March 10, 2024 What changed: how much to take   Lancet Device Misc 1 each by Does not apply route in the morning, at noon, and at bedtime. May substitute to any manufacturer covered by patient's insurance.   Lantus  SoloStar 100 UNIT/ML Solostar Pen Generic drug: insulin  glargine Inject 45 Units into the skin daily.   metoprolol  succinate 25 MG 24 hr tablet Commonly known as: TOPROL -XL Take 1 tablet (25 mg total) by mouth daily. Start taking on: March 10, 2024   montelukast 10 MG tablet Commonly known as: SINGULAIR Take 10 mg by mouth at bedtime.   Multi-Vitamin tablet Take 1 tablet by mouth daily.   nitroGLYCERIN  0.4 MG SL tablet Commonly known as: NITROSTAT  Place 1 tablet (0.4 mg total) under the tongue every 5 (five) minutes x 3 doses as needed for chest pain (if no relief after 3rd dose, proceed to ED or call 911).   ondansetron  4 MG disintegrating tablet Commonly known as: ZOFRAN -ODT Take 1 tablet (4 mg total) by mouth every 8 (eight) hours as needed for vomiting or nausea. For 7 days supply   pregabalin  50 MG capsule Commonly known as: LYRICA  Take 1 capsule (50 mg total) by mouth 3 (three) times daily as needed.   rosuvastatin  20 MG tablet Commonly known as: CRESTOR  Take 1 tablet (20 mg total) by mouth daily. What changed: Another medication with the same name  was removed. Continue taking this medication, and follow the directions you see here.   senna-docusate 8.6-50 MG tablet Commonly known as: Senokot-S Take 1-2 tablets by mouth 2 (two) times daily between meals as needed for mild constipation or moderate constipation.   tiotropium 18 MCG inhalation capsule Commonly known as: SPIRIVA  Place 1 capsule into inhaler and inhale daily.         Procedures/Studies: 10/14-right and left heart catheterization.  See details below.   DG Chest Port 1 View Result Date: 03/08/2024 EXAM: 1 VIEW XRAY OF THE CHEST 03/08/2024 12:56:00 PM COMPARISON: 03/04/2024, status post coronary artery bypass graft. CLINICAL HISTORY: Chest pain. FINDINGS: LINES, TUBES AND DEVICES: Right-sided PICC line is noted with distal tip in expected position in SVC. LUNGS AND PLEURA: No focal pulmonary opacity. No pulmonary edema. No pleural effusion. No pneumothorax. HEART AND MEDIASTINUM: No acute abnormality of the cardiac and mediastinal silhouettes. BONES AND SOFT TISSUES: No acute osseous abnormality. IMPRESSION: 1. Right-sided PICC line with tip in the expected position within the SVC. Electronically signed by: Lynwood Seip MD 03/08/2024 01:39 PM EDT RP Workstation: [id number]   MR CARDIAC MORPHOLOGY W WO CONTRAST Result Date: 03/07/2024 CLINICAL DATA:  67F with acute systolic heart failure (EF 20-25%) EXAM: CARDIAC MRI TECHNIQUE: The patient was scanned on a 1.5 Tesla Siemens magnet. A dedicated cardiac coil was used. Functional imaging was done using Fiesta sequences. 2,3, and 4 chamber views were done to assess for RWMA's. Modified Simpson's rule using a short axis stack was used to calculate an ejection fraction on a dedicated work [employer]. The patient received 10 cc of Gadavist. After 10 minutes inversion recovery sequences were used to assess for infiltration and scar tissue. Phase contrast velocity mapping was performed above the aortic and pulmonic  valves CONTRAST:  10 cc  of Gadavist FINDINGS: Left ventricle: -Normal size -No hypertophy -Moderate systolic function. Normal basal function with mid to apical akinesis -Elevated ECV (33%) -Elevated T2 values in mid to apical segments -Subendocardial LGE in basal inferolateral wall LV EF:  38% (Normal 52-79%) Absolute volumes: LV EDV: (Normal 78-167 mL) LV ESV: 97mL (Normal 21-64 mL) LV SV: 61mL (Normal 52-114 mL) CO: 4.4L/min (Normal 2.7-6.3 L/min) Indexed volumes: LV EDV: 54mL/sq-m (Normal 50-96 mL/sq-m) LV ESV: 48mL/sq-m (Normal 10-40 mL/sq-m) LV SV: 45mL/sq-m (Normal 33-64 mL/sq-m) CI: 2.1L/min/sq-m (Normal 1.9-3.9 L/min/sq-m) Right ventricle: Normal size and systolic function RV EF: 54% (Normal 52-80%) Absolute volumes: RV EDV: (Normal 79-175 mL) RV ESV: 51mL (Normal 13-75 mL) RV SV: 60mL (Normal 56-110 mL) CO: 4.3L/min (Normal 2.7-6 L/min) Indexed volumes: RV EDV: 20mL/sq-m (Normal 51-97 mL/sq-m) RV ESV: 35mL/sq-m (Normal 9-42 mL/sq-m) RV SV: 39mL/sq-m (Normal 35-61 mL/sq-m) CI: 2.1L/min/sq-m (Normal 1.8-3.8 L/min/sq-m) Left atrium: Normal size. Lipomatous hypertrophy of interatrial septum Right atrium: Normal size Mitral valve: Mild regurgitation Aortic valve: Trivial regurgitation Tricuspid valve: Mild regurgitation Pulmonic valve: Trivial regurgitation Aorta: Normal proximal ascending aorta Pericardium: Normal IMPRESSION: 1. Findings consistent with Takotsubo cardiomyopathy, with normal basal systolic function but mid to apical akinesis and elevated T2 values in mid to apical segments and no LGE in this region. 2. Subendocardial LGE in basal inferolateral wall, consistent with small infarct 3. Normal LV size, no hypertrophy, and moderate systolic dysfunction (EF 38%) 4.  Normal RV size and systolic function (EF 54%) 5.  Lipomatous hypertrophy of interatrial septum Electronically Signed   By: Lonni Nanas M.D.   On: 03/07/2024 22:37   MR CARDIAC VELOCITY FLOW MAP Result Date:  03/07/2024 CLINICAL DATA:  45F with acute systolic heart failure (EF 20-25%) EXAM: CARDIAC MRI TECHNIQUE: The patient was scanned on a 1.5 Tesla Siemens magnet. A dedicated cardiac coil was used. Functional imaging was done using Fiesta sequences. 2,3, and 4 chamber views were done to assess for RWMA's. Modified Simpson's rule using a short axis stack was used to calculate an ejection fraction on a dedicated work [employer], [employer]. The patient received 10 cc of Gadavist. After 10 minutes inversion recovery sequences were used to assess for infiltration and scar tissue. Phase contrast velocity mapping was performed above the aortic and pulmonic valves CONTRAST:  10 cc  of Gadavist FINDINGS: Left ventricle: -Normal size -No hypertophy -Moderate systolic function. Normal basal function with mid to apical akinesis -Elevated ECV (33%) -Elevated T2 values in mid to apical segments -Subendocardial LGE in basal inferolateral wall LV EF:  38% (Normal 52-79%) Absolute volumes: LV EDV: (Normal 78-167 mL) LV ESV: 97mL (Normal 21-64 mL) LV SV: 61mL (Normal 52-114 mL) CO: 4.4L/min (Normal 2.7-6.3 L/min) Indexed volumes: LV EDV: 54mL/sq-m (Normal 50-96 mL/sq-m) LV ESV: 57mL/sq-m (Normal 10-40 mL/sq-m) LV SV: 68mL/sq-m (Normal 33-64 mL/sq-m) CI: 2.1L/min/sq-m (Normal 1.9-3.9 L/min/sq-m) Right ventricle: Normal size and systolic function RV EF: 54% (Normal 52-80%) Absolute volumes: RV EDV: (Normal 79-175 mL) RV ESV: 51mL (Normal 13-75 mL) RV SV: 60mL (Normal 56-110 mL) CO: 4.3L/min (Normal 2.7-6 L/min) Indexed volumes: RV EDV: 26mL/sq-m (Normal 51-97 mL/sq-m) RV ESV: 65mL/sq-m (Normal 9-42 mL/sq-m) RV SV: 40mL/sq-m (Normal 35-61 mL/sq-m) CI: 2.1L/min/sq-m (Normal 1.8-3.8 L/min/sq-m) Left atrium: Normal size. Lipomatous hypertrophy of interatrial septum Right atrium: Normal size Mitral valve: Mild regurgitation Aortic valve: Trivial regurgitation Tricuspid valve: Mild regurgitation Pulmonic valve: Trivial  regurgitation Aorta: Normal proximal ascending aorta Pericardium: Normal IMPRESSION: 1. Findings consistent with Takotsubo  cardiomyopathy, with normal basal systolic function but mid to apical akinesis and elevated T2 values in mid to apical segments and no LGE in this region. 2. Subendocardial LGE in basal inferolateral wall, consistent with small infarct 3. Normal LV size, no hypertrophy, and moderate systolic dysfunction (EF 38%) 4.  Normal RV size and systolic function (EF 54%) 5.  Lipomatous hypertrophy of interatrial septum Electronically Signed   By: Lonni Nanas M.D.   On: 03/07/2024 22:37   MR CARDIAC VELOCITY FLOW MAP Result Date: 03/07/2024 CLINICAL DATA:  70F with acute systolic heart failure (EF 20-25%) EXAM: CARDIAC MRI TECHNIQUE: The patient was scanned on a 1.5 Tesla Siemens magnet. A dedicated cardiac coil was used. Functional imaging was done using Fiesta sequences. 2,3, and 4 chamber views were done to assess for RWMA's. Modified Simpson's rule using a short axis stack was used to calculate an ejection fraction on a dedicated work [employer], [employer]. The patient received 10 cc of Gadavist. After 10 minutes inversion recovery sequences were used to assess for infiltration and scar tissue. Phase contrast velocity mapping was performed above the aortic and pulmonic valves CONTRAST:  10 cc  of Gadavist FINDINGS: Left ventricle: -Normal size -No hypertophy -Moderate systolic function. Normal basal function with mid to apical akinesis -Elevated ECV (33%) -Elevated T2 values in mid to apical segments -Subendocardial LGE in basal inferolateral wall LV EF:  38% (Normal 52-79%) Absolute volumes: LV EDV: (Normal 78-167 mL) LV ESV: 97mL (Normal 21-64 mL) LV SV: 61mL (Normal 52-114 mL) CO: 4.4L/min (Normal 2.7-6.3 L/min) Indexed volumes: LV EDV: 68mL/sq-m (Normal 50-96 mL/sq-m) LV ESV: 47mL/sq-m (Normal 10-40 mL/sq-m) LV SV: 78mL/sq-m (Normal 33-64 mL/sq-m) CI: 2.1L/min/sq-m  (Normal 1.9-3.9 L/min/sq-m) Right ventricle: Normal size and systolic function RV EF: 54% (Normal 52-80%) Absolute volumes: RV EDV: (Normal 79-175 mL) RV ESV: 51mL (Normal 13-75 mL) RV SV: 60mL (Normal 56-110 mL) CO: 4.3L/min (Normal 2.7-6 L/min) Indexed volumes: RV EDV: 13mL/sq-m (Normal 51-97 mL/sq-m) RV ESV: 20mL/sq-m (Normal 9-42 mL/sq-m) RV SV: 42mL/sq-m (Normal 35-61 mL/sq-m) CI: 2.1L/min/sq-m (Normal 1.8-3.8 L/min/sq-m) Left atrium: Normal size. Lipomatous hypertrophy of interatrial septum Right atrium: Normal size Mitral valve: Mild regurgitation Aortic valve: Trivial regurgitation Tricuspid valve: Mild regurgitation Pulmonic valve: Trivial regurgitation Aorta: Normal proximal ascending aorta Pericardium: Normal IMPRESSION: 1. Findings consistent with Takotsubo cardiomyopathy, with normal basal systolic function but mid to apical akinesis and elevated T2 values in mid to apical segments and no LGE in this region. 2. Subendocardial LGE in basal inferolateral wall, consistent with small infarct 3. Normal LV size, no hypertrophy, and moderate systolic dysfunction (EF 38%) 4.  Normal RV size and systolic function (EF 54%) 5.  Lipomatous hypertrophy of interatrial septum Electronically Signed   By: Lonni Nanas M.D.   On: 03/07/2024 22:37   US  EKG SITE RITE Result Date: 03/07/2024 If Site Rite image not attached, placement could not be confirmed due to current cardiac rhythm.  CARDIAC CATHETERIZATION Result Date: 03/06/2024 Table formatting from the original result was not included. Images from the original result were not included.   Prox RCA lesion is 45% stenosed.   Mid LAD stent is 5% stenosed.  Dist LAD stent on is 10% stenosed.   Mid LM to Ost LAD lesion is 85% stenosed with 95% stenosed side branch in Ramus, and 99% stenosed Ost Cx to Prox Cx sidebranch.   Ost Cx to Prox Cx lesion is 99% stenosed. => This was residual from the initial stenosis.   LIMA-LAD  graft was visualized by  angiography and is normal in caliber.  The graft exhibits no disease.   SVG-RI graft was visualized by angiography and is normal in caliber.  The graft exhibits no disease.   SVG graft was injected, but not visualized due to it being occluded occlusion.  Origin lesion is 100% stenosed.   LV end diastolic pressure is normal.   There is no aortic valve stenosis. Dominance: Co-dominant Severe distal LM-ostial LCx/ostial RI disease with widely patent LIMA-LAD and SVG-RI. Likely flush occlusion of SVG-OM/LPL -> leaving the large LCx-OM compromised by a 99% eccentric stenosis Consider staged protected Left Main and LCx PCI once renal function is stabilized. Widely patent native RCA Severe cardiomyopathy with cardiac output/index of 3.93 and 1.98.  However well compensated with LVEDP of 8 mmHg and PCWP of 6 mmHg. => Okay to hydrate post cath RECOMMENDATIONS   Anticipated discharge date to be determined.   Titrate GDMT for cardiomyopathy.  Unclear that occlusion of the vein graft to the OM would explain significant drop in EF. Once stabilized from a renal and CHF standpoint, could consider protected left main-ostial LCx PCI.   Recommend Aspirin  81mg  daily for moderate CAD.    If the decision is made to proceed with LM-LCx PCI, would load with either 600 mg clopidogrel  or 180 mg Brilinta. Alm MICAEL Clay, MD, MS Alm Clay, M.D., M.S. Interventional Cardiologist [facility] Pager # [phone]  ECHOCARDIOGRAM LIMITED Result Date: 03/05/2024    ECHOCARDIOGRAM LIMITED REPORT   [name] [facility] Date of Exam: 03/05/2024 Medical Rec #:  [id number]        Height:       64.0 in Accession #:    [id number]       Weight:       207.9 lb Date of Birth:  1950-08-02         BSA:          1.989 m Patient Age:    73 years         BP:           108/72 mmHg Patient Gender: F                HR:           87 bpm. Exam Location:  [name] Procedure: Limited Echo and Intracardiac Opacification Agent (Both  Spectral and            Color Flow Doppler were utilized during procedure). Indications:    Chest Pain R07.9  History:        Patient has prior history of Echocardiogram examinations, most                 recent 10/25/2023. CHF, CAD and Previous Myocardial Infarction,                 Prior CABG; Risk Factors:Hypertension, Diabetes, Dyslipidemia                 and Current Smoker.  Sonographer:    Aida Pizza RCS Referring Phys: [id number] LORETTE CINDERELLA KAPUR IMPRESSIONS  1. Left ventricular ejection fraction, by estimation, is 25 to 30%. The left ventricle has severely decreased function. Wall motion abnormalities suggestive of possible stress induced cardiomyopathy (Takotsubo pattern), cannot exclude possible LAD territory ischemia.  2. The inferior vena cava is normal in size with greater than 50% respiratory variability, suggesting right atrial pressure of 3 mmHg. FINDINGS  Left Ventricle: Left ventricular  ejection fraction, by estimation, is 25 to 30%. The left ventricle has severely decreased function. Definity contrast agent was given IV to delineate the left ventricular endocardial borders.  LV Wall Scoring: The entire apex is akinetic. The anterior wall and anterior septum are hypokinetic. Venous: The inferior vena cava is normal in size with greater than 50% respiratory variability, suggesting right atrial pressure of 3 mmHg. LEFT VENTRICLE PLAX 2D LVIDd:         3.70 cm LVIDs:         2.60 cm LV PW:         1.10 cm LV IVS:        1.30 cm  LV Volumes (MOD) LV vol d, MOD A2C: 81.1 ml LV vol d, MOD A4C: 86.3 ml LV vol s, MOD A2C: 57.2 ml LV vol s, MOD A4C: 61.5 ml LV SV MOD A2C:     23.9 ml LV SV MOD A4C:     86.3 ml LV SV MOD BP:      27.3 ml LEFT ATRIUM         Index LA diam:    2.60 cm 1.31 cm/m   AORTA Ao Root diam: 2.80 cm Dorn Ross MD Electronically signed by Dorn Ross MD Signature Date/Time: 03/05/2024/3:09:43 PM    Final    DG CHEST PORT 1 VIEW Result Date: 03/04/2024 CLINICAL DATA:   [id number] Chest pain [id number] EXAM: PORTABLE CHEST 1 VIEW COMPARISON:  March 02, 2024, June 27, 2023 FINDINGS: The cardiomediastinal silhouette is unchanged in contour.Status post median sternotomy. Atherosclerotic calcifications. No pleural effusion. No pneumothorax. No acute pleuroparenchymal abnormality. IMPRESSION: No acute cardiopulmonary abnormality. Electronically Signed   By: Corean Salter M.D.   On: 03/04/2024 10:21   DG Chest Portable 1 View Result Date: 03/02/2024 CLINICAL DATA:  Shortness of breath, nausea, vomiting, diarrhea, and abdominal pain. Frequent urination. EXAM: PORTABLE CHEST 1 VIEW COMPARISON:  10/24/2023 FINDINGS: Postoperative changes in the mediastinum. Normal heart size and pulmonary vascularity. No focal airspace disease or consolidation in the lungs. No blunting of costophrenic angles. No pneumothorax. Mediastinal contours appear intact. Degenerative changes in spine and shoulders. IMPRESSION: No active disease. Electronically Signed   By: Elsie Gravely M.D.   On: 03/02/2024 19:42   CT ABDOMEN PELVIS WO CONTRAST Result Date: 03/02/2024 CLINICAL DATA:  Acute abdominal pain EXAM: CT ABDOMEN AND PELVIS WITHOUT CONTRAST TECHNIQUE: Multidetector CT imaging of the abdomen and pelvis was performed following the standard protocol without IV contrast. RADIATION DOSE REDUCTION: This exam was performed according to the departmental dose-optimization program which includes automated exposure control, adjustment of the mA and/or kV according to patient size and/or use of iterative reconstruction technique. COMPARISON:  None Available. FINDINGS: Lower chest: No acute abnormality. 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 Hepatobiliary: No focal liver abnormality is seen. No gallstones, gallbladder wall thickening, or biliary dilatation. Pancreas: Unremarkable. No pancreatic ductal dilatation or surrounding inflammatory changes. Spleen: Normal in size without focal abnormality.  Adrenals/Urinary Tract: Adrenal glands are within normal limits. Kidneys are well visualized bilaterally. No renal calculi or obstructive changes are seen. 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 The bladder is well distended. Stomach/Bowel: Appendix is within normal limits. No obstructive or inflammatory changes of the colon are seen. Small bowel and stomach are within normal limits. Vascular/Lymphatic: Aortic atherosclerosis. No enlarged abdominal or pelvic lymph nodes. Reproductive: Status post hysterectomy. No adnexal masses. Other: No abdominal wall hernia or abnormality. No abdominopelvic  ascites. Musculoskeletal: No acute or significant osseous findings. IMPRESSION: No acute abnormality to correspond with the given clinical history. Electronically Signed   By: Oneil Devonshire M.D.   On: 03/02/2024 19:29       The results of significant diagnostics from this hospitalization (including imaging, microbiology, ancillary and laboratory) are listed below for reference.     Microbiology: Recent Results (from the past 240 hours)  Resp panel by RT-PCR (RSV, Flu A&B, Covid) Anterior Nasal Swab     Status: None   Collection Time: 03/02/24  7:33 PM   Specimen: Anterior Nasal Swab  Result Value Ref Range Status   SARS Coronavirus 2 by RT PCR NEGATIVE NEGATIVE Final    Comment: (NOTE) SARS-CoV-2 target nucleic acids are NOT DETECTED.  The SARS-CoV-2 RNA is generally detectable in upper respiratory specimens during the acute phase of infection. The lowest concentration of SARS-CoV-2 viral copies this assay can detect is 138 copies/mL. A negative result does not preclude SARS-Cov-2 infection and should not be used as the sole basis for treatment or other patient management decisions. A negative result may occur with  improper specimen collection/handling, submission of specimen other than nasopharyngeal swab, presence of viral mutation(s)  within the areas targeted by this assay, and inadequate number of viral copies(<138 copies/mL). A negative result must be combined with clinical observations, patient history, and epidemiological information. The expected result is Negative.  Fact Sheet for Patients:  BloggerCourse.com  Fact Sheet for Healthcare Providers:  SeriousBroker.it  This test is no t yet approved or cleared by the [place]  FDA and  has been authorized for detection and/or diagnosis of SARS-CoV-2 by FDA under an Emergency Use Authorization (EUA). This EUA will remain  in effect (meaning this test can be used) for the duration of the COVID-19 declaration under Section 564(b)(1) of the Act, 21 U.S.C.section 360bbb-3(b)(1), unless the authorization is terminated  or revoked sooner.       Influenza A by PCR NEGATIVE NEGATIVE Final   Influenza B by PCR NEGATIVE NEGATIVE Final    Comment: (NOTE) The Xpert Xpress SARS-CoV-2/FLU/RSV plus assay is intended as an aid in the diagnosis of influenza from Nasopharyngeal swab specimens and should not be used as a sole basis for treatment. Nasal washings and aspirates are unacceptable for Xpert Xpress SARS-CoV-2/FLU/RSV testing.  Fact Sheet for Patients: BloggerCourse.com  Fact Sheet for Healthcare Providers: SeriousBroker.it  This test is not yet approved or cleared by the [place]  FDA and has been authorized for detection and/or diagnosis of SARS-CoV-2 by FDA under an Emergency Use Authorization (EUA). This EUA will remain in effect (meaning this test can be used) for the duration of the COVID-19 declaration under Section 564(b)(1) of the Act, 21 U.S.C. section 360bbb-3(b)(1), unless the authorization is terminated or revoked.     Resp Syncytial Virus by PCR NEGATIVE NEGATIVE Final    Comment: (NOTE) Fact Sheet for  Patients: BloggerCourse.com  Fact Sheet for Healthcare Providers: SeriousBroker.it  This test is not yet approved or cleared by the [place]  FDA and has been authorized for detection and/or diagnosis of SARS-CoV-2 by FDA under an Emergency Use Authorization (EUA). This EUA will remain in effect (meaning this test can be used) for the duration of the COVID-19 declaration under Section 564(b)(1) of the Act, 21 U.S.C. section 360bbb-3(b)(1), unless the authorization is terminated or revoked.  Performed at [facility], [address], [place], [place] [address]   Urine Culture     Status: Abnormal  Collection Time: 03/02/24  8:00 PM   Specimen: Urine, Random  Result Value Ref Range Status   Specimen Description   Final    URINE, RANDOM Performed at [facility], [address] [place], [place] [address]    Special Requests   Final    NONE Reflexed from [id number] Performed at [facility], [address] [place], [place] [address]    Culture MULTIPLE SPECIES PRESENT, SUGGEST RECOLLECTION (A)  Final   Report Status 03/04/2024 FINAL  Final  MRSA Next Gen by PCR, Nasal     Status: None   Collection Time: 03/02/24 10:00 PM   Specimen: Nasal Mucosa; Nasal Swab  Result Value Ref Range Status   MRSA by PCR Next Gen NOT DETECTED NOT DETECTED Final    Comment: (NOTE) The GeneXpert MRSA Assay (FDA approved for NASAL specimens only), is one component of a comprehensive MRSA colonization surveillance program. It is not intended to diagnose MRSA infection nor to guide or monitor treatment for MRSA infections. Test performance is not FDA approved in patients less than 75 years old. Performed at [facility], [address], [place], [place] [address]   Respiratory (~20 pathogens) panel by PCR     Status: None   Collection Time: 03/02/24 10:20 PM   Specimen: Nasopharyngeal Swab; Respiratory  Result Value Ref Range Status   Adenovirus NOT  DETECTED NOT DETECTED Final   Coronavirus 229E NOT DETECTED NOT DETECTED Final    Comment: (NOTE) The Coronavirus on the Respiratory Panel, DOES NOT test for the novel  Coronavirus (2019 nCoV)    Coronavirus HKU1 NOT DETECTED NOT DETECTED Final   Coronavirus NL63 NOT DETECTED NOT DETECTED Final   Coronavirus OC43 NOT DETECTED NOT DETECTED Final   Metapneumovirus NOT DETECTED NOT DETECTED Final   Rhinovirus / Enterovirus NOT DETECTED NOT DETECTED Final   Influenza A NOT DETECTED NOT DETECTED Final   Influenza B NOT DETECTED NOT DETECTED Final   Parainfluenza Virus 1 NOT DETECTED NOT DETECTED Final   Parainfluenza Virus 2 NOT DETECTED NOT DETECTED Final   Parainfluenza Virus 3 NOT DETECTED NOT DETECTED Final   Parainfluenza Virus 4 NOT DETECTED NOT DETECTED Final   Respiratory Syncytial Virus NOT DETECTED NOT DETECTED Final   Bordetella pertussis NOT DETECTED NOT DETECTED Final   Bordetella Parapertussis NOT DETECTED NOT DETECTED Final   Chlamydophila pneumoniae NOT DETECTED NOT DETECTED Final   Mycoplasma pneumoniae NOT DETECTED NOT DETECTED Final    Comment: Performed at [facility], [address], [place], [place] [address]     Labs:  CBC: Recent Labs  Lab 03/02/24 1830 03/03/24 0255 03/04/24 0018 03/06/24 1612 03/06/24 1857 03/06/24 1858 03/06/24 2042 03/07/24 0551 03/08/24 0500  WBC 11.8* 13.3* 9.5  --   --   --  8.0 8.3 7.3  NEUTROABS 8.7*  --  5.6  --   --   --   --   --   --   HGB 15.2* 14.9 13.0   < > 10.2* 10.2* 13.7 13.0 11.7*  HCT 43.0 43.7 37.5   < > 30.0* 30.0* 40.3 38.1 34.4*  MCV 91.3 93.6 92.8  --   --   --  94.8 93.8 94.2  PLT 257 231 221  --   --   --  208 199 192   < > = values in this interval not displayed.   BMP &GFR Recent Labs  Lab 03/05/24 0354 03/06/24 0430 03/06/24 1612 03/06/24 1857 03/06/24 1858 03/06/24 2042  03/07/24 0551 03/08/24 0500 03/09/24 0500 03/09/24 0800  NA 135 135   < > 132* 132*  --  132* 134* 136  --   K  3.5 3.7   < > 3.3* 3.3*  --  3.6 3.9 4.1  --   CL 99 101  --   --   --   --  102 106 104  --   CO2 24 23  --   --   --   --  22 23 23   --   GLUCOSE 125* 157*  --   --   --   --  201* 124* 190*  --   BUN 23 25*  --   --   --   --  18 15 16   --   CREATININE 1.58* 1.65*  --   --   --  1.47* 1.53* 1.50* 1.57*  --   CALCIUM  10.4* 10.4*  --   --   --   --  10.1 10.3 10.6*  --   MG 1.9 2.0  --   --   --   --  1.7 1.8  --  1.8   < > = values in this interval not displayed.   Estimated Creatinine Clearance: 36.3 mL/min (A) (by C-G formula based on SCr of 1.57 mg/dL (H)). Liver & Pancreas: Recent Labs  Lab 03/02/24 1830  AST 26  ALT 22  ALKPHOS 84  BILITOT 0.8  PROT 7.3  ALBUMIN  4.1   Recent Labs  Lab 03/02/24 1830  LIPASE 22   No results for input(s): AMMONIA in the last 168 hours. Diabetic: No results for input(s): HGBA1C in the last 72 hours. Recent Labs  Lab 03/08/24 1622 03/08/24 2034 03/09/24 0339 03/09/24 0605 03/09/24 1050  GLUCAP 155* 194* 174* 161* 177*   Cardiac Enzymes: No results for input(s): CKTOTAL, CKMB, CKMBINDEX, TROPONINI in the last 168 hours. No results for input(s): PROBNP in the last 8760 hours. Coagulation Profile: No results for input(s): INR, PROTIME in the last 168 hours. Thyroid  Function Tests: No results for input(s): TSH, T4TOTAL, FREET4, T3FREE, THYROIDAB in the last 72 hours. Lipid Profile: No results for input(s): CHOL, HDL, LDLCALC, TRIG, CHOLHDL, LDLDIRECT in the last 72 hours. Anemia Panel: No results for input(s): VITAMINB12, FOLATE, FERRITIN, TIBC, IRON, RETICCTPCT in the last 72 hours. Urine analysis:    Component Value Date/Time   COLORURINE YELLOW 03/02/2024 2000   APPEARANCEUR HAZY (A) 03/02/2024 2000   LABSPEC 1.015 03/02/2024 2000   PHURINE 5.0 03/02/2024 2000   GLUCOSEU >=500 (A) 03/02/2024 2000   HGBUR NEGATIVE 03/02/2024 2000   BILIRUBINUR NEGATIVE 03/02/2024 2000    KETONESUR NEGATIVE 03/02/2024 2000   PROTEINUR NEGATIVE 03/02/2024 2000   NITRITE NEGATIVE 03/02/2024 2000   LEUKOCYTESUR SMALL (A) 03/02/2024 2000   Sepsis Labs: Invalid input(s): PROCALCITONIN, LACTICIDVEN   SIGNED:  Jaquanna Ballentine T Mycala Warshawsky, MD  [facility] 03/09/2024, 4:15 PM

## 2024-03-09 NOTE — TOC Transition Note (Addendum)
 Transition of Care [facility]) - Discharge Note   Patient Details  Name: Katelyn Kaufman MRN: [id number] Date of Birth: 02-Oct-1950  [facility] CM/SW Contact:  Waddell Barnie Rama, RN Phone Number: 03/09/2024, 11:06 AM   Clinical Narrative:    For dc today, NCM notified [facility] that patient is for dc today and will need to resume outpatient palliative services.  Patient states she has a walker, cane and a scooter at home.  She states she does not need any other DME or HH services she is good per patient. Per PT eval no f/u needed.  Per OT , patient interested in shower chair,  informed her the out of pocket price is 47.00 , asked if she still wanted it, she states no,  NCM informed her that she could get one cheaper on [place], or at [place], she states she would get one off [employer].       Barriers to Discharge: Continued Medical Work up   Patient Goals and CMS Choice Patient states their goals for this hospitalization and ongoing recovery are:: return home   Choice offered to / list presented to : Patient [place] ownership interest in [facility].provided to::  (n/a)    Discharge Placement                       Discharge Plan and Services Additional resources added to the After Visit Summary for   In-house Referral: Clinical Social Work                                   Social Drivers of Health (SDOH) Interventions SDOH Screenings   Food Insecurity: No Food Insecurity (03/02/2024)  Housing: Low Risk  (03/02/2024)  Transportation Needs: No Transportation Needs (03/02/2024)  Utilities: Not At Risk (03/02/2024)  Depression (PHQ2-9): Medium Risk (02/29/2024)  Financial Resource Strain: Low Risk  (02/10/2023)   [facility]  Recent Concern: Financial Resource Strain - Medium Risk (11/24/2022)   [facility] from [facility]  Physical Activity: Insufficiently Active (12/30/2022)   Received from [facility]  Social  Connections: Moderately Integrated (10/24/2023)  Stress: Stress Concern Present (12/14/2023)   Received from [facility]  Tobacco Use: High Risk (03/02/2024)  Health Literacy: Low Risk  (02/03/2024)   [facility] from [facility]     Readmission Risk Interventions    03/06/2024    7:38 AM 10/27/2023   10:46 AM 11/23/2021    9:39 AM  Readmission Risk Prevention Plan  Post Dischage Appt   Complete  Medication Screening   Complete  Transportation Screening Complete Complete Complete  PCP or Specialist Appt within 5-7 Days  Complete   Home Care Screening  Complete   Medication Review (RN CM)  Complete   HRI or Home Care Consult Complete    Social Work Consult for Recovery Care Planning/Counseling Complete    Palliative Care Screening Not Applicable    Medication Review Oceanographer) Complete

## 2024-03-09 NOTE — Progress Notes (Signed)
 Rounding Note   Patient Name: Katelyn Kaufman Date of Encounter: 03/09/2024  [facility] Cardiologist: Debera  Subjective BP 112/50.  Renal function stable at Cr 1.57.  Reported persistent chest pain yesterday, was sharp and reproducible with palpation, suspected musculoskeletal pain.  Troponins downtrending.  She reports chest pain has resolved.  Denies any dyspnea.  Scheduled Meds:  alum & mag hydroxide-simeth  30 mL Oral Once   anastrozole   1 mg Oral Daily   aspirin  EC  81 mg Oral Daily   buPROPion  150 mg Oral q morning   Chlorhexidine  Gluconate Cloth  6 each Topical Daily   DULoxetine   60 mg Oral Daily   gabapentin   200 mg Oral TID   guaiFENesin  1,200 mg Oral BID   heparin   5,000 Units Subcutaneous Q8H   hydrALAZINE   10 mg Oral Q8H   insulin  aspart  0-20 Units Subcutaneous TID WC   insulin  aspart  0-5 Units Subcutaneous QHS   insulin  aspart  10 Units Subcutaneous TID WC   insulin  glargine-yfgn  40 Units Subcutaneous Daily   isosorbide  mononitrate  15 mg Oral Daily   metoprolol  succinate  25 mg Oral Daily   montelukast  10 mg Oral QHS   pantoprazole   40 mg Oral Daily   rosuvastatin   20 mg Oral Daily   senna-docusate  2 tablet Oral QHS   sodium chloride  flush  10-40 mL Intracatheter Q12H   sodium chloride  flush  3 mL Intravenous Q12H   umeclidinium bromide   1 puff Inhalation Daily   Continuous Infusions:   PRN Meds: acetaminophen , dextrose , fentaNYL  (SUBLIMAZE ) injection, Influenza vac split trivalent PF, [COMPLETED] methocarbamol (ROBAXIN) injection **FOLLOWED BY** methocarbamol, nitroGLYCERIN , mouth rinse, oxyCODONE , sodium chloride  flush, sodium chloride  flush   Vital Signs  Vitals:   03/08/24 2342 03/09/24 0121 03/09/24 0340 03/09/24 0711  BP: 127/75  119/69 (!) 112/50  Pulse: 63  64 80  Resp: 18  17 18   Temp: 98.1 F (36.7 C)  98.5 F (36.9 C) 97.8 F (36.6 C)  TempSrc: Oral  Oral Oral  SpO2: 98%  95% 96%  Weight:  97.9 kg    Height:         Intake/Output Summary (Last 24 hours) at 03/09/2024 0954 Last data filed at 03/09/2024 0839 Gross per 24 hour  Intake 730 ml  Output --  Net 730 ml      03/09/2024    1:21 AM 03/08/2024   11:00 PM 03/06/2024    6:49 PM  Last 3 Weights  Weight (lbs) 215 lb 13.3 oz 215 lb 13.3 oz 205 lb 11 oz  Weight (kg) 97.9 kg 97.9 kg 93.3 kg      Telemetry NSR - Personally Reviewed  ECG  N/a - Personally Reviewed  Physical Exam  GEN: No acute distress.   Neck: No JVD Cardiac: RRR, no murmurs, rubs, or gallops.  Respiratory: Clear to auscultation bilaterally. GI: Soft, nontender, non-distended  MS: No edema; No deformity. Neuro:  Nonfocal  Psych: Normal affect   Labs High Sensitivity Troponin:   Recent Labs  Lab 03/08/24 1237 03/08/24 1437  TROPONINIHS 421* 380*     Chemistry Recent Labs  Lab 03/02/24 1830 03/02/24 2213 03/07/24 0551 03/08/24 0500 03/09/24 0500 03/09/24 0800  NA 116*   < > 132* 134* 136  --   K 4.4   < > 3.6 3.9 4.1  --   CL 80*   < > 102 106 104  --   CO2  20*   < > 22 23 23   --   GLUCOSE 653*   < > 201* 124* 190*  --   BUN 47*   < > 18 15 16   --   CREATININE 3.16*   < > 1.53* 1.50* 1.57*  --   CALCIUM  11.6*   < > 10.1 10.3 10.6*  --   MG 2.6*   < > 1.7 1.8  --  1.8  PROT 7.3  --   --   --   --   --   ALBUMIN  4.1  --   --   --   --   --   AST 26  --   --   --   --   --   ALT 22  --   --   --   --   --   ALKPHOS 84  --   --   --   --   --   BILITOT 0.8  --   --   --   --   --   GFRNONAA 15*   < > 36* 37* 35*  --   ANIONGAP 15   < > 8 5 9   --    < > = values in this interval not displayed.    Lipids No results for input(s): CHOL, TRIG, HDL, LABVLDL, LDLCALC, CHOLHDL in the last 168 hours.  Hematology Recent Labs  Lab 03/06/24 2042 03/07/24 0551 03/08/24 0500  WBC 8.0 8.3 7.3  RBC 4.25 4.06 3.65*  HGB 13.7 13.0 11.7*  HCT 40.3 38.1 34.4*  MCV 94.8 93.8 94.2  MCH 32.2 32.0 32.1  MCHC 34.0 34.1 34.0  RDW 13.8 13.8 13.7   PLT 208 199 192   Thyroid  No results for input(s): TSH, FREET4 in the last 168 hours.  BNPNo results for input(s): BNP, PROBNP in the last 168 hours.  DDimer No results for input(s): DDIMER in the last 168 hours.   Radiology  DG Chest Port 1 View Result Date: 03/08/2024 EXAM: 1 VIEW XRAY OF THE CHEST 03/08/2024 12:56:00 PM COMPARISON: 03/04/2024, status post coronary artery bypass graft. CLINICAL HISTORY: Chest pain. FINDINGS: LINES, TUBES AND DEVICES: Right-sided PICC line is noted with distal tip in expected position in SVC. LUNGS AND PLEURA: No focal pulmonary opacity. No pulmonary edema. No pleural effusion. No pneumothorax. HEART AND MEDIASTINUM: No acute abnormality of the cardiac and mediastinal silhouettes. BONES AND SOFT TISSUES: No acute osseous abnormality. IMPRESSION: 1. Right-sided PICC line with tip in the expected position within the SVC. Electronically signed by: Lynwood Seip MD 03/08/2024 01:39 PM EDT RP Workstation: [id number]   MR CARDIAC MORPHOLOGY W WO CONTRAST Result Date: 03/07/2024 CLINICAL DATA:  25F with acute systolic heart failure (EF 20-25%) EXAM: CARDIAC MRI TECHNIQUE: The patient was scanned on a 1.5 Tesla Siemens magnet. A dedicated cardiac coil was used. Functional imaging was done using Fiesta sequences. 2,3, and 4 chamber views were done to assess for RWMA's. Modified Simpson's rule using a short axis stack was used to calculate an ejection fraction on a dedicated work [employer]. The patient received 10 cc of Gadavist. After 10 minutes inversion recovery sequences were used to assess for infiltration and scar tissue. Phase contrast velocity mapping was performed above the aortic and pulmonic valves CONTRAST:  10 cc  of Gadavist FINDINGS: Left ventricle: -Normal size -No hypertophy -Moderate systolic function. Normal basal function with mid to apical akinesis -Elevated ECV (33%) -Elevated T2 values in mid  to apical segments -Subendocardial  LGE in basal inferolateral wall LV EF:  38% (Normal 52-79%) Absolute volumes: LV EDV: (Normal 78-167 mL) LV ESV: 97mL (Normal 21-64 mL) LV SV: 61mL (Normal 52-114 mL) CO: 4.4L/min (Normal 2.7-6.3 L/min) Indexed volumes: LV EDV: 22mL/sq-m (Normal 50-96 mL/sq-m) LV ESV: 83mL/sq-m (Normal 10-40 mL/sq-m) LV SV: 58mL/sq-m (Normal 33-64 mL/sq-m) CI: 2.1L/min/sq-m (Normal 1.9-3.9 L/min/sq-m) Right ventricle: Normal size and systolic function RV EF: 54% (Normal 52-80%) Absolute volumes: RV EDV: (Normal 79-175 mL) RV ESV: 51mL (Normal 13-75 mL) RV SV: 60mL (Normal 56-110 mL) CO: 4.3L/min (Normal 2.7-6 L/min) Indexed volumes: RV EDV: 92mL/sq-m (Normal 51-97 mL/sq-m) RV ESV: 43mL/sq-m (Normal 9-42 mL/sq-m) RV SV: 59mL/sq-m (Normal 35-61 mL/sq-m) CI: 2.1L/min/sq-m (Normal 1.8-3.8 L/min/sq-m) Left atrium: Normal size. Lipomatous hypertrophy of interatrial septum Right atrium: Normal size Mitral valve: Mild regurgitation Aortic valve: Trivial regurgitation Tricuspid valve: Mild regurgitation Pulmonic valve: Trivial regurgitation Aorta: Normal proximal ascending aorta Pericardium: Normal IMPRESSION: 1. Findings consistent with Takotsubo cardiomyopathy, with normal basal systolic function but mid to apical akinesis and elevated T2 values in mid to apical segments and no LGE in this region. 2. Subendocardial LGE in basal inferolateral wall, consistent with small infarct 3. Normal LV size, no hypertrophy, and moderate systolic dysfunction (EF 38%) 4.  Normal RV size and systolic function (EF 54%) 5.  Lipomatous hypertrophy of interatrial septum Electronically Signed   By: Lonni Nanas M.D.   On: 03/07/2024 22:37   MR CARDIAC VELOCITY FLOW MAP Result Date: 03/07/2024 CLINICAL DATA:  66F with acute systolic heart failure (EF 20-25%) EXAM: CARDIAC MRI TECHNIQUE: The patient was scanned on a 1.5 Tesla Siemens magnet. A dedicated cardiac coil was used. Functional imaging was done using Fiesta sequences. 2,3, and 4  chamber views were done to assess for RWMA's. Modified Simpson's rule using a short axis stack was used to calculate an ejection fraction on a dedicated work [employer] officer, [employer]. The patient received 10 cc of Gadavist. After 10 minutes inversion recovery sequences were used to assess for infiltration and scar tissue. Phase contrast velocity mapping was performed above the aortic and pulmonic valves CONTRAST:  10 cc  of Gadavist FINDINGS: Left ventricle: -Normal size -No hypertophy -Moderate systolic function. Normal basal function with mid to apical akinesis -Elevated ECV (33%) -Elevated T2 values in mid to apical segments -Subendocardial LGE in basal inferolateral wall LV EF:  38% (Normal 52-79%) Absolute volumes: LV EDV: (Normal 78-167 mL) LV ESV: 97mL (Normal 21-64 mL) LV SV: 61mL (Normal 52-114 mL) CO: 4.4L/min (Normal 2.7-6.3 L/min) Indexed volumes: LV EDV: 36mL/sq-m (Normal 50-96 mL/sq-m) LV ESV: 1mL/sq-m (Normal 10-40 mL/sq-m) LV SV: 66mL/sq-m (Normal 33-64 mL/sq-m) CI: 2.1L/min/sq-m (Normal 1.9-3.9 L/min/sq-m) Right ventricle: Normal size and systolic function RV EF: 54% (Normal 52-80%) Absolute volumes: RV EDV: (Normal 79-175 mL) RV ESV: 51mL (Normal 13-75 mL) RV SV: 60mL (Normal 56-110 mL) CO: 4.3L/min (Normal 2.7-6 L/min) Indexed volumes: RV EDV: 44mL/sq-m (Normal 51-97 mL/sq-m) RV ESV: 90mL/sq-m (Normal 9-42 mL/sq-m) RV SV: 55mL/sq-m (Normal 35-61 mL/sq-m) CI: 2.1L/min/sq-m (Normal 1.8-3.8 L/min/sq-m) Left atrium: Normal size. Lipomatous hypertrophy of interatrial septum Right atrium: Normal size Mitral valve: Mild regurgitation Aortic valve: Trivial regurgitation Tricuspid valve: Mild regurgitation Pulmonic valve: Trivial regurgitation Aorta: Normal proximal ascending aorta Pericardium: Normal IMPRESSION: 1. Findings consistent with Takotsubo cardiomyopathy, with normal basal systolic function but mid to apical akinesis and elevated T2 values in mid to apical segments and no LGE  in this region. 2. Subendocardial LGE in  basal inferolateral wall, consistent with small infarct 3. Normal LV size, no hypertrophy, and moderate systolic dysfunction (EF 38%) 4.  Normal RV size and systolic function (EF 54%) 5.  Lipomatous hypertrophy of interatrial septum Electronically Signed   By: Lonni Nanas M.D.   On: 03/07/2024 22:37   MR CARDIAC VELOCITY FLOW MAP Result Date: 03/07/2024 CLINICAL DATA:  47F with acute systolic heart failure (EF 20-25%) EXAM: CARDIAC MRI TECHNIQUE: The patient was scanned on a 1.5 Tesla Siemens magnet. A dedicated cardiac coil was used. Functional imaging was done using Fiesta sequences. 2,3, and 4 chamber views were done to assess for RWMA's. Modified Simpson's rule using a short axis stack was used to calculate an ejection fraction on a dedicated work [employer] officer, [employer]. The patient received 10 cc of Gadavist. After 10 minutes inversion recovery sequences were used to assess for infiltration and scar tissue. Phase contrast velocity mapping was performed above the aortic and pulmonic valves CONTRAST:  10 cc  of Gadavist FINDINGS: Left ventricle: -Normal size -No hypertophy -Moderate systolic function. Normal basal function with mid to apical akinesis -Elevated ECV (33%) -Elevated T2 values in mid to apical segments -Subendocardial LGE in basal inferolateral wall LV EF:  38% (Normal 52-79%) Absolute volumes: LV EDV: (Normal 78-167 mL) LV ESV: 97mL (Normal 21-64 mL) LV SV: 61mL (Normal 52-114 mL) CO: 4.4L/min (Normal 2.7-6.3 L/min) Indexed volumes: LV EDV: 37mL/sq-m (Normal 50-96 mL/sq-m) LV ESV: 81mL/sq-m (Normal 10-40 mL/sq-m) LV SV: 82mL/sq-m (Normal 33-64 mL/sq-m) CI: 2.1L/min/sq-m (Normal 1.9-3.9 L/min/sq-m) Right ventricle: Normal size and systolic function RV EF: 54% (Normal 52-80%) Absolute volumes: RV EDV: (Normal 79-175 mL) RV ESV: 51mL (Normal 13-75 mL) RV SV: 60mL (Normal 56-110 mL) CO: 4.3L/min (Normal 2.7-6 L/min) Indexed  volumes: RV EDV: 25mL/sq-m (Normal 51-97 mL/sq-m) RV ESV: 31mL/sq-m (Normal 9-42 mL/sq-m) RV SV: 99mL/sq-m (Normal 35-61 mL/sq-m) CI: 2.1L/min/sq-m (Normal 1.8-3.8 L/min/sq-m) Left atrium: Normal size. Lipomatous hypertrophy of interatrial septum Right atrium: Normal size Mitral valve: Mild regurgitation Aortic valve: Trivial regurgitation Tricuspid valve: Mild regurgitation Pulmonic valve: Trivial regurgitation Aorta: Normal proximal ascending aorta Pericardium: Normal IMPRESSION: 1. Findings consistent with Takotsubo cardiomyopathy, with normal basal systolic function but mid to apical akinesis and elevated T2 values in mid to apical segments and no LGE in this region. 2. Subendocardial LGE in basal inferolateral wall, consistent with small infarct 3. Normal LV size, no hypertrophy, and moderate systolic dysfunction (EF 38%) 4.  Normal RV size and systolic function (EF 54%) 5.  Lipomatous hypertrophy of interatrial septum Electronically Signed   By: Lonni Nanas M.D.   On: 03/07/2024 22:37   US  EKG SITE RITE Result Date: 03/07/2024 If Site Rite image not attached, placement could not be confirmed due to current cardiac rhythm.    Patient Profile   JAMESINA GAUGH is a 73 y.o. female with a hx of CAD (s/p CABG in 10/2021 LIMA to LAD, SVG to OM, and SVG to ramus intermedius), HTN, HLD, OSA, DM2, breast cancer s/p L mastectomy, depression/anxiety, chronic pain syndrome, tobacco abuse  who is being seen 03/05/2024 for the evaluation of chest pain at the request of Dr. Vicci.   Assessment & Plan   Chest pain/CAD: h/o CAD with CABG in 2023 LIMA to LAD, SVG to OM, and SVG to ramus intermedius. 10/2023 echo: LVEF 60-65%, no WMAs, grade I dd, normal RV. 10/2023 nuclear stress: no ischemia.  P/w EKG showing lateral TWIs, elevated trop to 181 trending down in setting of significant  systemic illness. Presented with hyperglycemia, metabolic acidosis, lactic acidosis, hyponatremia, AKI on CKD, N/V/D, URI  symptoms probable viral syndrome. Smptoms overall not classic for cardiac ischemia. Has some chronic tenderness to palpation over area. Recent viral syndrome including URI symptoms that may have played a role in symptoms. Echo LVEF 25-30%, apical WMAs in a Takotsubo like pattern but cannot exclude LAD ischemia.  - RHC/LHC showed proximal RCA 45%, mid LAD 5%, mid left main to ostial LAD 85% with 95% ramus, 99% ostial to proximal Lcx; patent LIMA-LAD and SVG-ramus but occluded SVG-OM leaving the large LCx-OM compromised by a 99% eccentric stenosis.  Recommended to consider staged protected left main and LCx PCI once renal function stabilized -I discussed with Dr. Anner in interventional cardiology.  Cardiac MRI confirms Takotsubo cardiomyopathy.  Given this, would favor holding off on PCI at this time to allow time for resolution of Takotsubo.  Would plan repeat echocardiogram in 1 month and then follow-up with Dr. Anner in clinic to consider staged PCI at that time.  CMR does show small area of scar in basal inferolateral wall, but LCx territory is viable - Continue aspirin , rosuvastatin . LDL 38 12/2023  Acute combined heart failure: Echocardiogram with EF 25 to 30%.  While does have obstructive CAD as above, wall motion abnormalities suggest Takotsubos.  Cardiac MRI on 10/15 with findings consistent with Takotsubo's cardiomyopathy, LVEF 38%, RVEF 54% - Discontinued amlodipine  to allow more room to titrate GDMT.   - No ACE/ARB/Arni or spironolactone at this point due to AKI - Continue Toprol -XL - Added Imdur  15 mg daily and hydralazine  25 mg 3 times daily  Hyperosmolar hyperglycemic state: admitted with BG in the 600s, metabolic acidosis. Hyponatreamia, AKI on CKD, lactic acidosis. This was in the setting of likely viral syndrome with cough, nausea, vomiting, congestion.  - per primary team  AKI: Creatinine 3.2 on presentation, in setting of HHS as above.  Improving, currently 1.57  [facility] will sign off.   Medication Recommendations: Aspirin  81 mg daily, hydralazine  25 mg 3 times daily, Imdur  15 mg daily, Toprol -XL 25 mg daily, rosuvastatin  20 mg daily.  Recommend Lasix  40 mg daily as needed as outpatient, will monitor daily weights and can take if gains more than 3 pounds 1 day or 5 pounds in 1 week.  Discontinued amlodipine  and HCTZ Other recommendations (labs, testing, etc): BMET in 1 week Follow up as an outpatient: Will arrange echocardiogram in 1 month and then follow-up with Dr. Anner to consider PCI if resolution of Takotsubo cardiomyopathy    For questions or updates, please contact [facility] Please consult [email or web address] for contact info under       Signed, Lonni LITTIE Nanas, MD  03/09/2024, 9:54 AM

## 2024-03-09 NOTE — Plan of Care (Signed)
 Problem: Education: Goal: Knowledge of General Education information will improve Description: Including pain rating scale, medication(s)/side effects and non-pharmacologic comfort measures Outcome: Progressing   Problem: Health Behavior/Discharge Planning: Goal: Ability to manage health-related needs will improve Outcome: Progressing   Problem: Clinical Measurements: Goal: Ability to maintain clinical measurements within normal limits will improve Outcome: Progressing Goal: Will remain free from infection Outcome: Progressing Goal: Diagnostic test results will improve Outcome: Progressing Goal: Respiratory complications will improve Outcome: Progressing Goal: Cardiovascular complication will be avoided Outcome: Progressing   Problem: Activity: Goal: Risk for activity intolerance will decrease Outcome: Progressing   Problem: Nutrition: Goal: Adequate nutrition will be maintained Outcome: Progressing   Problem: Coping: Goal: Level of anxiety will decrease Outcome: Progressing   Problem: Elimination: Goal: Will not experience complications related to bowel motility Outcome: Progressing Goal: Will not experience complications related to urinary retention Outcome: Progressing   Problem: Pain Managment: Goal: General experience of comfort will improve and/or be controlled Outcome: Progressing   Problem: Safety: Goal: Ability to remain free from injury will improve Outcome: Progressing   Problem: Skin Integrity: Goal: Risk for impaired skin integrity will decrease Outcome: Progressing   Problem: Education: Goal: Ability to describe self-care measures that may prevent or decrease complications (Diabetes Survival Skills Education) will improve Outcome: Progressing Goal: Individualized Educational Video(s) Outcome: Progressing   Problem: Coping: Goal: Ability to adjust to condition or change in health will improve Outcome: Progressing   Problem: Fluid  Volume: Goal: Ability to maintain a balanced intake and output will improve Outcome: Progressing   Problem: Health Behavior/Discharge Planning: Goal: Ability to identify and utilize available resources and services will improve Outcome: Progressing Goal: Ability to manage health-related needs will improve Outcome: Progressing   Problem: Metabolic: Goal: Ability to maintain appropriate glucose levels will improve Outcome: Progressing   Problem: Nutritional: Goal: Maintenance of adequate nutrition will improve Outcome: Progressing Goal: Progress toward achieving an optimal weight will improve Outcome: Progressing   Problem: Skin Integrity: Goal: Risk for impaired skin integrity will decrease Outcome: Progressing   Problem: Tissue Perfusion: Goal: Adequacy of tissue perfusion will improve Outcome: Progressing   Problem: Education: Goal: Ability to describe self-care measures that may prevent or decrease complications (Diabetes Survival Skills Education) will improve Outcome: Progressing Goal: Individualized Educational Video(s) Outcome: Progressing   Problem: Cardiac: Goal: Ability to maintain an adequate cardiac output will improve Outcome: Progressing   Problem: Health Behavior/Discharge Planning: Goal: Ability to identify and utilize available resources and services will improve Outcome: Progressing Goal: Ability to manage health-related needs will improve Outcome: Progressing   Problem: Fluid Volume: Goal: Ability to achieve a balanced intake and output will improve Outcome: Progressing   Problem: Metabolic: Goal: Ability to maintain appropriate glucose levels will improve Outcome: Progressing   Problem: Nutritional: Goal: Maintenance of adequate nutrition will improve Outcome: Progressing Goal: Maintenance of adequate weight for body size and type will improve Outcome: Progressing   Problem: Respiratory: Goal: Will regain and/or maintain adequate  ventilation Outcome: Progressing   Problem: Urinary Elimination: Goal: Ability to achieve and maintain adequate renal perfusion and functioning will improve Outcome: Progressing   Problem: Education: Goal: Knowledge of disease or condition will improve Outcome: Progressing Goal: Knowledge of the prescribed therapeutic regimen will improve Outcome: Progressing Goal: Individualized Educational Video(s) Outcome: Progressing   Problem: Activity: Goal: Ability to tolerate increased activity will improve Outcome: Progressing Goal: Will verbalize the importance of balancing activity with adequate rest periods Outcome: Progressing   Problem: Respiratory: Goal: Ability to maintain  a clear airway will improve Outcome: Progressing Goal: Levels of oxygenation will improve Outcome: Progressing Goal: Ability to maintain adequate ventilation will improve Outcome: Progressing   Problem: Education: Goal: Understanding of CV disease, CV risk reduction, and recovery process will improve Outcome: Progressing Goal: Individualized Educational Video(s) Outcome: Progressing   Problem: Activity: Goal: Ability to return to baseline activity level will improve Outcome: Progressing   Problem: Cardiovascular: Goal: Ability to achieve and maintain adequate cardiovascular perfusion will improve Outcome: Progressing Goal: Vascular access site(s) Level 0-1 will be maintained Outcome: Progressing   Problem: Health Behavior/Discharge Planning: Goal: Ability to safely manage health-related needs after discharge will improve Outcome: Progressing

## 2024-03-09 NOTE — Plan of Care (Signed)
 Problem: Education: Goal: Knowledge of General Education information will improve Description: Including pain rating scale, medication(s)/side effects and non-pharmacologic comfort measures 03/09/2024 1057 by Johnie Will HERO, RN Outcome: Adequate for Discharge 03/09/2024 9043 by Johnie Will HERO, RN Outcome: Progressing   Problem: Health Behavior/Discharge Planning: Goal: Ability to manage health-related needs will improve 03/09/2024 1057 by Johnie Will HERO, RN Outcome: Adequate for Discharge 03/09/2024 9043 by Johnie Will HERO, RN Outcome: Progressing   Problem: Clinical Measurements: Goal: Ability to maintain clinical measurements within normal limits will improve 03/09/2024 1057 by Johnie Will HERO, RN Outcome: Adequate for Discharge 03/09/2024 9043 by Johnie Will HERO, RN Outcome: Progressing Goal: Will remain free from infection 03/09/2024 1057 by Johnie Will HERO, RN Outcome: Adequate for Discharge 03/09/2024 9043 by Johnie Will HERO, RN Outcome: Progressing Goal: Diagnostic test results will improve 03/09/2024 1057 by Johnie Will HERO, RN Outcome: Adequate for Discharge 03/09/2024 9043 by Johnie Will HERO, RN Outcome: Progressing Goal: Respiratory complications will improve 03/09/2024 1057 by Johnie Will HERO, RN Outcome: Adequate for Discharge 03/09/2024 9043 by Johnie Will HERO, RN Outcome: Progressing Goal: Cardiovascular complication will be avoided 03/09/2024 1057 by Johnie Will HERO, RN Outcome: Adequate for Discharge 03/09/2024 9043 by Johnie Will HERO, RN Outcome: Progressing   Problem: Activity: Goal: Risk for activity intolerance will decrease 03/09/2024 1057 by Johnie Will HERO, RN Outcome: Adequate for Discharge 03/09/2024 9043 by Johnie Will HERO, RN Outcome: Progressing   Problem: Nutrition: Goal: Adequate nutrition will be maintained 03/09/2024 1057 by Johnie Will HERO, RN Outcome:  Adequate for Discharge 03/09/2024 9043 by Johnie Will HERO, RN Outcome: Progressing   Problem: Coping: Goal: Level of anxiety will decrease 03/09/2024 1057 by Johnie Will HERO, RN Outcome: Adequate for Discharge 03/09/2024 9043 by Johnie Will HERO, RN Outcome: Progressing   Problem: Elimination: Goal: Will not experience complications related to bowel motility 03/09/2024 1057 by Johnie Will HERO, RN Outcome: Adequate for Discharge 03/09/2024 9043 by Johnie Will HERO, RN Outcome: Progressing Goal: Will not experience complications related to urinary retention 03/09/2024 1057 by Johnie Will HERO, RN Outcome: Adequate for Discharge 03/09/2024 9043 by Johnie Will HERO, RN Outcome: Progressing   Problem: Pain Managment: Goal: General experience of comfort will improve and/or be controlled 03/09/2024 1057 by Johnie Will HERO, RN Outcome: Adequate for Discharge 03/09/2024 9043 by Johnie Will HERO, RN Outcome: Progressing   Problem: Safety: Goal: Ability to remain free from injury will improve 03/09/2024 1057 by Johnie Will HERO, RN Outcome: Adequate for Discharge 03/09/2024 9043 by Johnie Will HERO, RN Outcome: Progressing   Problem: Skin Integrity: Goal: Risk for impaired skin integrity will decrease 03/09/2024 1057 by Johnie Will HERO, RN Outcome: Adequate for Discharge 03/09/2024 9043 by Johnie Will HERO, RN Outcome: Progressing   Problem: Education: Goal: Ability to describe self-care measures that may prevent or decrease complications (Diabetes Survival Skills Education) will improve 03/09/2024 1057 by Johnie Will HERO, RN Outcome: Adequate for Discharge 03/09/2024 [phone] by Johnie Will HERO, RN Outcome: Progressing Goal: Individualized Educational Video(s) 03/09/2024 1057 by Johnie Will HERO, RN Outcome: Adequate for Discharge 03/09/2024 [phone] by Johnie Will HERO, RN Outcome: Progressing   Problem: Coping: Goal:  Ability to adjust to condition or change in health will improve 03/09/2024 1057 by Johnie Will HERO, RN Outcome: Adequate for Discharge 03/09/2024 9043 by Johnie Will HERO, RN Outcome: Progressing   Problem: Fluid Volume: Goal: Ability to maintain a balanced intake and output will improve 03/09/2024 1057 by Johnie Will HERO, RN Outcome: Adequate for Discharge 03/09/2024  9043 by Johnie Will HERO, RN Outcome: Progressing   Problem: Health Behavior/Discharge Planning: Goal: Ability to identify and utilize available resources and services will improve 03/09/2024 1057 by Johnie Will HERO, RN Outcome: Adequate for Discharge 03/09/2024 9043 by Johnie Will HERO, RN Outcome: Progressing Goal: Ability to manage health-related needs will improve 03/09/2024 1057 by Johnie Will HERO, RN Outcome: Adequate for Discharge 03/09/2024 9043 by Johnie Will HERO, RN Outcome: Progressing   Problem: Metabolic: Goal: Ability to maintain appropriate glucose levels will improve 03/09/2024 1057 by Johnie Will HERO, RN Outcome: Adequate for Discharge 03/09/2024 9043 by Johnie Will HERO, RN Outcome: Progressing   Problem: Nutritional: Goal: Maintenance of adequate nutrition will improve 03/09/2024 1057 by Johnie Will HERO, RN Outcome: Adequate for Discharge 03/09/2024 9043 by Johnie Will HERO, RN Outcome: Progressing Goal: Progress toward achieving an optimal weight will improve 03/09/2024 1057 by Johnie Will HERO, RN Outcome: Adequate for Discharge 03/09/2024 9043 by Johnie Will HERO, RN Outcome: Progressing   Problem: Skin Integrity: Goal: Risk for impaired skin integrity will decrease 03/09/2024 1057 by Johnie Will HERO, RN Outcome: Adequate for Discharge 03/09/2024 9043 by Johnie Will HERO, RN Outcome: Progressing   Problem: Tissue Perfusion: Goal: Adequacy of tissue perfusion will improve 03/09/2024 1057 by Johnie Will HERO, RN Outcome:  Adequate for Discharge 03/09/2024 9043 by Johnie Will HERO, RN Outcome: Progressing   Problem: Education: Goal: Ability to describe self-care measures that may prevent or decrease complications (Diabetes Survival Skills Education) will improve 03/09/2024 1057 by Johnie Will HERO, RN Outcome: Adequate for Discharge 03/09/2024 [phone] by Johnie Will HERO, RN Outcome: Progressing Goal: Individualized Educational Video(s) 03/09/2024 1057 by Johnie Will HERO, RN Outcome: Adequate for Discharge 03/09/2024 [phone] by Johnie Will HERO, RN Outcome: Progressing   Problem: Cardiac: Goal: Ability to maintain an adequate cardiac output will improve 03/09/2024 1057 by Johnie Will HERO, RN Outcome: Adequate for Discharge 03/09/2024 9043 by Johnie Will HERO, RN Outcome: Progressing   Problem: Health Behavior/Discharge Planning: Goal: Ability to identify and utilize available resources and services will improve 03/09/2024 1057 by Johnie Will HERO, RN Outcome: Adequate for Discharge 03/09/2024 9043 by Johnie Will HERO, RN Outcome: Progressing Goal: Ability to manage health-related needs will improve 03/09/2024 1057 by Johnie Will HERO, RN Outcome: Adequate for Discharge 03/09/2024 9043 by Johnie Will HERO, RN Outcome: Progressing   Problem: Fluid Volume: Goal: Ability to achieve a balanced intake and output will improve 03/09/2024 1057 by Johnie Will HERO, RN Outcome: Adequate for Discharge 03/09/2024 9043 by Johnie Will HERO, RN Outcome: Progressing   Problem: Metabolic: Goal: Ability to maintain appropriate glucose levels will improve 03/09/2024 1057 by Johnie Will HERO, RN Outcome: Adequate for Discharge 03/09/2024 9043 by Johnie Will HERO, RN Outcome: Progressing   Problem: Nutritional: Goal: Maintenance of adequate nutrition will improve 03/09/2024 1057 by Johnie Will HERO, RN Outcome: Adequate for Discharge 03/09/2024 9043 by  Johnie Will HERO, RN Outcome: Progressing Goal: Maintenance of adequate weight for body size and type will improve 03/09/2024 1057 by Johnie Will HERO, RN Outcome: Adequate for Discharge 03/09/2024 9043 by Johnie Will HERO, RN Outcome: Progressing   Problem: Respiratory: Goal: Will regain and/or maintain adequate ventilation 03/09/2024 1057 by Johnie Will HERO, RN Outcome: Adequate for Discharge 03/09/2024 9043 by Johnie Will HERO, RN Outcome: Progressing   Problem: Urinary Elimination: Goal: Ability to achieve and maintain adequate renal perfusion and functioning will improve 03/09/2024 1057 by Johnie Will HERO, RN Outcome: Adequate for Discharge 03/09/2024 9043 by Johnie Will HERO, RN Outcome:  Progressing   Problem: Education: Goal: Knowledge of disease or condition will improve 03/09/2024 1057 by Johnie Will HERO, RN Outcome: Adequate for Discharge 03/09/2024 9043 by Johnie Will HERO, RN Outcome: Progressing Goal: Knowledge of the prescribed therapeutic regimen will improve 03/09/2024 1057 by Johnie Will HERO, RN Outcome: Adequate for Discharge 03/09/2024 9043 by Johnie Will HERO, RN Outcome: Progressing Goal: Individualized Educational Video(s) 03/09/2024 1057 by Johnie Will HERO, RN Outcome: Adequate for Discharge 03/09/2024 [phone] by Johnie Will HERO, RN Outcome: Progressing   Problem: Activity: Goal: Ability to tolerate increased activity will improve 03/09/2024 1057 by Johnie Will HERO, RN Outcome: Adequate for Discharge 03/09/2024 9043 by Johnie Will HERO, RN Outcome: Progressing Goal: Will verbalize the importance of balancing activity with adequate rest periods 03/09/2024 1057 by Johnie Will HERO, RN Outcome: Adequate for Discharge 03/09/2024 9043 by Johnie Will HERO, RN Outcome: Progressing   Problem: Respiratory: Goal: Ability to maintain a clear airway will improve 03/09/2024 1057 by Johnie Will HERO, RN Outcome: Adequate for Discharge 03/09/2024 9043 by Johnie Will HERO, RN Outcome: Progressing Goal: Levels of oxygenation will improve 03/09/2024 1057 by Johnie Will HERO, RN Outcome: Adequate for Discharge 03/09/2024 9043 by Johnie Will HERO, RN Outcome: Progressing Goal: Ability to maintain adequate ventilation will improve 03/09/2024 1057 by Johnie Will HERO, RN Outcome: Adequate for Discharge 03/09/2024 9043 by Johnie Will HERO, RN Outcome: Progressing   Problem: Acute Rehab PT Goals(only PT should resolve) Goal: Pt Will Go Supine/Side To Sit Outcome: Adequate for Discharge Goal: Patient Will Transfer Sit To/From Stand Outcome: Adequate for Discharge Goal: Pt Will Transfer Bed To Chair/Chair To Bed Outcome: Adequate for Discharge Goal: Pt Will Ambulate Outcome: Adequate for Discharge   Problem: Education: Goal: Understanding of CV disease, CV risk reduction, and recovery process will improve 03/09/2024 1057 by Johnie Will HERO, RN Outcome: Adequate for Discharge 03/09/2024 0956 by Johnie Will HERO, RN Outcome: Progressing Goal: Individualized Educational Video(s) 03/09/2024 1057 by Johnie Will HERO, RN Outcome: Adequate for Discharge 03/09/2024 0956 by Johnie Will HERO, RN Outcome: Progressing   Problem: Activity: Goal: Ability to return to baseline activity level will improve 03/09/2024 1057 by Johnie Will HERO, RN Outcome: Adequate for Discharge 03/09/2024 9043 by Johnie Will HERO, RN Outcome: Progressing   Problem: Cardiovascular: Goal: Ability to achieve and maintain adequate cardiovascular perfusion will improve 03/09/2024 1057 by Johnie Will HERO, RN Outcome: Adequate for Discharge 03/09/2024 9043 by Johnie Will HERO, RN Outcome: Progressing Goal: Vascular access site(s) Level 0-1 will be maintained 03/09/2024 1057 by Johnie Will HERO, RN Outcome: Adequate for Discharge 03/09/2024 9043 by Johnie Will HERO, RN Outcome: Progressing   Problem: Health Behavior/Discharge Planning: Goal: Ability to safely manage health-related needs after discharge will improve 03/09/2024 1057 by Johnie Will HERO, RN Outcome: Adequate for Discharge 03/09/2024 9043 by Johnie Will HERO, RN Outcome: Progressing

## 2024-03-09 NOTE — Care Management Important Message (Signed)
 Important Message  Patient Details  Name: Katelyn Kaufman MRN: [id number] Date of Birth: 12-26-1950   Important Message Given:  Yes - Medicare IM     Vonzell Arrie Sharps 03/09/2024, 11:35 AM

## 2024-03-09 NOTE — Progress Notes (Signed)
 Discharge instructions (including medications) discussed with and copy provided to patient. Patient given the opportunity to ask questions. Questions clarified.

## 2024-03-13 ENCOUNTER — Telehealth: Payer: Self-pay | Admitting: Nurse Practitioner

## 2024-03-13 DIAGNOSIS — I5181 Takotsubo syndrome: Secondary | ICD-10-CM

## 2024-03-13 DIAGNOSIS — I25119 Atherosclerotic heart disease of native coronary artery with unspecified angina pectoris: Secondary | ICD-10-CM

## 2024-03-13 NOTE — Telephone Encounter (Signed)
 Pt states she was in the hospital and they found a blockage. Please advise.

## 2024-03-13 NOTE — Telephone Encounter (Signed)
 Spoke with patient and she states she just was discharged from [facility] hospital last week after being transferred there for [facility]. Patient states during the Heart cath the doctor found and blockage and did not fix it at that time. Patient states the doctor said they were going to fix it at [facility] but is really unsure of what is going on.  Patient wants to know what the next steps are regarding this, sooner appt, recommendations, etc.  Advised patient I would route to providers for review and will get back with her as soon as possible.

## 2024-03-14 NOTE — Telephone Encounter (Signed)
 Patient informed and verbalized understanding of plan.

## 2024-03-14 NOTE — Addendum Note (Signed)
 Addended by: Aylah Yeary M on: 03/14/2024 09:43 AM   Modules accepted: Orders

## 2024-03-14 NOTE — Telephone Encounter (Signed)
 Patient informed and verbalized understanding of plan. Reports she does have nitroglycerin  on hand.

## 2024-03-15 ENCOUNTER — Telehealth: Payer: Self-pay | Admitting: *Deleted

## 2024-03-15 NOTE — Telephone Encounter (Signed)
-----   Message from Lonni LITTIE Nanas sent at 03/08/2024 11:13 AM EDT ----- Regarding: Appointment Erskin Mems, I spoke with Dr Anner about this patient- he had cathed her and has severe disease needing PCI, but she also has Takotsubo cardiomyopathy and we would like to wait for her EF to improve before pursuing PCI.  We are going to schedule an echo in 1 month and then f/u in clinic with Dr Anner after that to consider PCI.  We could not find any openings- could you help us  with getting on his schedule in 1 month? Thanks, [place]

## 2024-03-15 NOTE — Telephone Encounter (Signed)
 Patient has echo schedule for Nov 6 , 2025  in [place].

## 2024-03-19 NOTE — Telephone Encounter (Signed)
 Pt calling to f/u as far as next step for Blockage. Pt wants to know if she needs to keep 11/12 appt or come in sooner. Please advise

## 2024-03-19 NOTE — Telephone Encounter (Signed)
 Patient notified and verbalized understanding.  Patient placed on wait list for [facility].

## 2024-03-20 NOTE — Telephone Encounter (Signed)
 Spoke with patient.   Schedule an appt with Dr Anner 04/10/24   after echo has been completed on 04/08/24  in [place].  Patient is aware appt has been reschedule from 04/04/24 with Brittany -Tinnie to Dr Anner 04/10/24 in [employer]   To discuss possible PCI  after result of Echo.

## 2024-03-23 ENCOUNTER — Other Ambulatory Visit: Payer: Self-pay

## 2024-03-23 MED ORDER — ANASTROZOLE 1 MG PO TABS: 1.0000 mg | ORAL_TABLET | Freq: Every day | ORAL | 3 refills | Status: AC

## 2024-03-29 ENCOUNTER — Ambulatory Visit: Attending: Cardiology

## 2024-03-29 DIAGNOSIS — I25119 Atherosclerotic heart disease of native coronary artery with unspecified angina pectoris: Secondary | ICD-10-CM | POA: Diagnosis not present

## 2024-03-29 DIAGNOSIS — I5181 Takotsubo syndrome: Secondary | ICD-10-CM

## 2024-03-29 LAB — ECHOCARDIOGRAM COMPLETE
AR max vel: 2.55 cm2
AV Peak grad: 8.9 mmHg
Ao pk vel: 1.49 m/s
Area-P 1/2: 3.7 cm2
Calc EF: 58.2 %
S' Lateral: 3.1 cm
Single Plane A2C EF: 57.6 %
Single Plane A4C EF: 55.5 %

## 2024-03-30 ENCOUNTER — Ambulatory Visit: Payer: Self-pay | Admitting: Cardiology

## 2024-04-04 ENCOUNTER — Ambulatory Visit: Admitting: Student

## 2024-04-10 ENCOUNTER — Encounter: Payer: Self-pay | Admitting: Cardiology

## 2024-04-10 ENCOUNTER — Emergency Department (HOSPITAL_COMMUNITY)

## 2024-04-10 ENCOUNTER — Encounter (HOSPITAL_COMMUNITY): Payer: Self-pay

## 2024-04-10 ENCOUNTER — Ambulatory Visit: Attending: Cardiology | Admitting: Cardiology

## 2024-04-10 ENCOUNTER — Inpatient Hospital Stay (HOSPITAL_COMMUNITY)
Admission: EM | Admit: 2024-04-10 | Discharge: 2024-04-12 | DRG: 360 | Disposition: A | Discharge status: Home / Self Care | Attending: Emergency Medicine | Admitting: Emergency Medicine

## 2024-04-10 VITALS — BP 145/70 | HR 80 | Ht 64.0 in | Wt 217.0 lb

## 2024-04-10 DIAGNOSIS — I5032 Chronic diastolic (congestive) heart failure: Secondary | ICD-10-CM | POA: Diagnosis present

## 2024-04-10 DIAGNOSIS — Z853 Personal history of malignant neoplasm of breast: Secondary | ICD-10-CM

## 2024-04-10 DIAGNOSIS — I10 Essential (primary) hypertension: Secondary | ICD-10-CM | POA: Diagnosis present

## 2024-04-10 DIAGNOSIS — E860 Dehydration: Secondary | ICD-10-CM | POA: Diagnosis present

## 2024-04-10 DIAGNOSIS — I25110 Atherosclerotic heart disease of native coronary artery with unstable angina pectoris: Secondary | ICD-10-CM | POA: Diagnosis not present

## 2024-04-10 DIAGNOSIS — E1122 Type 2 diabetes mellitus with diabetic chronic kidney disease: Secondary | ICD-10-CM | POA: Diagnosis present

## 2024-04-10 DIAGNOSIS — F17210 Nicotine dependence, cigarettes, uncomplicated: Secondary | ICD-10-CM | POA: Diagnosis present

## 2024-04-10 DIAGNOSIS — Z961 Presence of intraocular lens: Secondary | ICD-10-CM | POA: Diagnosis present

## 2024-04-10 DIAGNOSIS — E7849 Other hyperlipidemia: Secondary | ICD-10-CM | POA: Diagnosis present

## 2024-04-10 DIAGNOSIS — I5181 Takotsubo syndrome: Secondary | ICD-10-CM | POA: Diagnosis present

## 2024-04-10 DIAGNOSIS — N1831 Chronic kidney disease, stage 3a: Secondary | ICD-10-CM | POA: Diagnosis present

## 2024-04-10 DIAGNOSIS — I130 Hypertensive heart and chronic kidney disease with heart failure and stage 1 through stage 4 chronic kidney disease, or unspecified chronic kidney disease: Secondary | ICD-10-CM | POA: Diagnosis present

## 2024-04-10 DIAGNOSIS — Z951 Presence of aortocoronary bypass graft: Secondary | ICD-10-CM

## 2024-04-10 DIAGNOSIS — Z79811 Long term (current) use of aromatase inhibitors: Secondary | ICD-10-CM

## 2024-04-10 DIAGNOSIS — E118 Type 2 diabetes mellitus with unspecified complications: Secondary | ICD-10-CM

## 2024-04-10 DIAGNOSIS — R0789 Other chest pain: Secondary | ICD-10-CM | POA: Diagnosis not present

## 2024-04-10 DIAGNOSIS — Z7951 Long term (current) use of inhaled steroids: Secondary | ICD-10-CM

## 2024-04-10 DIAGNOSIS — Z7982 Long term (current) use of aspirin: Secondary | ICD-10-CM

## 2024-04-10 DIAGNOSIS — Z91040 Latex allergy status: Secondary | ICD-10-CM

## 2024-04-10 DIAGNOSIS — Z79899 Other long term (current) drug therapy: Secondary | ICD-10-CM

## 2024-04-10 DIAGNOSIS — Z9851 Tubal ligation status: Secondary | ICD-10-CM

## 2024-04-10 DIAGNOSIS — G4733 Obstructive sleep apnea (adult) (pediatric): Secondary | ICD-10-CM | POA: Diagnosis present

## 2024-04-10 DIAGNOSIS — Z794 Long term (current) use of insulin: Secondary | ICD-10-CM

## 2024-04-10 DIAGNOSIS — I25119 Atherosclerotic heart disease of native coronary artery with unspecified angina pectoris: Secondary | ICD-10-CM

## 2024-04-10 DIAGNOSIS — N183 Chronic kidney disease, stage 3 unspecified: Secondary | ICD-10-CM | POA: Diagnosis present

## 2024-04-10 DIAGNOSIS — E1169 Type 2 diabetes mellitus with other specified complication: Secondary | ICD-10-CM

## 2024-04-10 DIAGNOSIS — Z9841 Cataract extraction status, right eye: Secondary | ICD-10-CM

## 2024-04-10 DIAGNOSIS — I2089 Other forms of angina pectoris: Secondary | ICD-10-CM | POA: Diagnosis present

## 2024-04-10 DIAGNOSIS — Z955 Presence of coronary angioplasty implant and graft: Secondary | ICD-10-CM

## 2024-04-10 DIAGNOSIS — Z7902 Long term (current) use of antithrombotics/antiplatelets: Secondary | ICD-10-CM

## 2024-04-10 DIAGNOSIS — N1832 Chronic kidney disease, stage 3b: Secondary | ICD-10-CM

## 2024-04-10 DIAGNOSIS — F419 Anxiety disorder, unspecified: Secondary | ICD-10-CM | POA: Diagnosis present

## 2024-04-10 DIAGNOSIS — Z9012 Acquired absence of left breast and nipple: Secondary | ICD-10-CM

## 2024-04-10 DIAGNOSIS — Z96651 Presence of right artificial knee joint: Secondary | ICD-10-CM | POA: Diagnosis present

## 2024-04-10 DIAGNOSIS — F17200 Nicotine dependence, unspecified, uncomplicated: Secondary | ICD-10-CM

## 2024-04-10 DIAGNOSIS — Z9842 Cataract extraction status, left eye: Secondary | ICD-10-CM

## 2024-04-10 DIAGNOSIS — J449 Chronic obstructive pulmonary disease, unspecified: Secondary | ICD-10-CM | POA: Diagnosis present

## 2024-04-10 DIAGNOSIS — I252 Old myocardial infarction: Secondary | ICD-10-CM

## 2024-04-10 DIAGNOSIS — Z8249 Family history of ischemic heart disease and other diseases of the circulatory system: Secondary | ICD-10-CM

## 2024-04-10 DIAGNOSIS — E785 Hyperlipidemia, unspecified: Secondary | ICD-10-CM

## 2024-04-10 DIAGNOSIS — Z888 Allergy status to other drugs, medicaments and biological substances status: Secondary | ICD-10-CM

## 2024-04-10 LAB — BASIC METABOLIC PANEL WITH GFR
Anion gap: 8 (ref 5–15)
BUN: 14 mg/dL (ref 8–23)
CO2: 27 mmol/L (ref 22–32)
Calcium: 10 mg/dL (ref 8.9–10.3)
Chloride: 106 mmol/L (ref 98–111)
Creatinine, Ser: 1.4 mg/dL — ABNORMAL HIGH (ref 0.44–1.00)
GFR, Estimated: 40 mL/min — ABNORMAL LOW (ref >60)
Glucose, Bld: 198 mg/dL — ABNORMAL HIGH (ref 70–99)
Potassium: 3.8 mmol/L (ref 3.5–5.1)
Sodium: 141 mmol/L (ref 135–145)

## 2024-04-10 LAB — CBC
HCT: 38.7 % (ref 36.0–46.0)
Hemoglobin: 12.3 g/dL (ref 12.0–15.0)
MCH: 31.7 pg (ref 26.0–34.0)
MCHC: 31.8 g/dL (ref 30.0–36.0)
MCV: 99.7 fL (ref 80.0–100.0)
Platelets: 235 K/uL (ref 150–400)
RBC: 3.88 MIL/uL (ref 3.87–5.11)
RDW: 14.9 % (ref 11.5–15.5)
WBC: 5.8 K/uL (ref 4.0–10.5)
nRBC: 0 % (ref 0.0–0.2)

## 2024-04-10 LAB — TROPONIN I (HIGH SENSITIVITY)
Troponin I (High Sensitivity): 32 ng/L — ABNORMAL HIGH (ref <18)
Troponin I (High Sensitivity): 32 ng/L — ABNORMAL HIGH (ref <18)

## 2024-04-10 MED ORDER — NITROGLYCERIN 0.4 MG SL SUBL: 0.4000 mg | SUBLINGUAL_TABLET | SUBLINGUAL | Status: DC | PRN

## 2024-04-10 MED ORDER — ASPIRIN 81 MG PO TBEC
81.0000 mg | DELAYED_RELEASE_TABLET | Freq: Every day | ORAL | Status: DC
  Administered 2024-04-11 – 2024-04-12 (×2): 81 mg via ORAL
  Filled 2024-04-10 (×2): qty 1

## 2024-04-10 MED ORDER — SODIUM CHLORIDE 0.9 % IV SOLN: 250.0000 mL | INTRAVENOUS | Status: DC | PRN

## 2024-04-10 MED ORDER — HEPARIN (PORCINE) 25000 UT/250ML-% IV SOLN
1250.0000 [IU]/h | INTRAVENOUS | Status: DC
  Administered 2024-04-10 – 2024-04-11 (×2): 1300 [IU]/h via INTRAVENOUS
  Filled 2024-04-10 (×2): qty 250

## 2024-04-10 MED ORDER — SODIUM CHLORIDE 0.9% FLUSH: 3.0000 mL | INTRAVENOUS | Status: DC | PRN

## 2024-04-10 MED ORDER — ONDANSETRON HCL 4 MG/2ML IJ SOLN: 4.0000 mg | Freq: Four times a day (QID) | INTRAMUSCULAR | Status: DC | PRN

## 2024-04-10 MED ORDER — GABAPENTIN 300 MG PO CAPS
300.0000 mg | ORAL_CAPSULE | Freq: Three times a day (TID) | ORAL | Status: DC
  Administered 2024-04-10 – 2024-04-12 (×4): 300 mg via ORAL
  Filled 2024-04-10 (×4): qty 1

## 2024-04-10 MED ORDER — MONTELUKAST SODIUM 10 MG PO TABS
10.0000 mg | ORAL_TABLET | Freq: Every day | ORAL | Status: DC
  Administered 2024-04-10 – 2024-04-11 (×2): 10 mg via ORAL
  Filled 2024-04-10 (×2): qty 1

## 2024-04-10 MED ORDER — REVEFENACIN 175 MCG/3ML IN SOLN
175.0000 ug | Freq: Every day | RESPIRATORY_TRACT | Status: DC
  Administered 2024-04-11 – 2024-04-12 (×2): 175 ug via RESPIRATORY_TRACT
  Filled 2024-04-10 (×2): qty 3

## 2024-04-10 MED ORDER — SENNOSIDES-DOCUSATE SODIUM 8.6-50 MG PO TABS: 1.0000 | ORAL_TABLET | Freq: Two times a day (BID) | ORAL | Status: DC | PRN

## 2024-04-10 MED ORDER — LORATADINE 10 MG PO TABS
10.0000 mg | ORAL_TABLET | Freq: Every day | ORAL | Status: DC
  Administered 2024-04-11 – 2024-04-12 (×2): 10 mg via ORAL
  Filled 2024-04-10 (×2): qty 1

## 2024-04-10 MED ORDER — TICAGRELOR 90 MG PO TABS
90.0000 mg | ORAL_TABLET | Freq: Two times a day (BID) | ORAL | Status: DC
  Administered 2024-04-11 – 2024-04-12 (×3): 90 mg via ORAL
  Filled 2024-04-10 (×4): qty 1

## 2024-04-10 MED ORDER — ACETAMINOPHEN 325 MG PO TABS
650.0000 mg | ORAL_TABLET | ORAL | Status: DC | PRN
  Administered 2024-04-10 – 2024-04-11 (×3): 650 mg via ORAL
  Filled 2024-04-10 (×3): qty 2

## 2024-04-10 MED ORDER — TICAGRELOR 90 MG PO TABS
180.0000 mg | ORAL_TABLET | Freq: Once | ORAL | Status: AC
  Administered 2024-04-10: 180 mg via ORAL
  Filled 2024-04-10: qty 2

## 2024-04-10 MED ORDER — SODIUM CHLORIDE 0.9% FLUSH
3.0000 mL | Freq: Two times a day (BID) | INTRAVENOUS | Status: DC
  Administered 2024-04-10 – 2024-04-12 (×3): 3 mL via INTRAVENOUS

## 2024-04-10 MED ORDER — HEPARIN BOLUS VIA INFUSION
4000.0000 [IU] | Freq: Once | INTRAVENOUS | Status: AC
  Administered 2024-04-10: 4000 [IU] via INTRAVENOUS
  Filled 2024-04-10: qty 4000

## 2024-04-10 MED ORDER — INSULIN ASPART 100 UNIT/ML IJ SOLN
0.0000 [IU] | Freq: Three times a day (TID) | INTRAMUSCULAR | Status: DC
  Administered 2024-04-12: 1 [IU] via SUBCUTANEOUS
  Filled 2024-04-10: qty 2

## 2024-04-10 MED ORDER — ROSUVASTATIN CALCIUM 20 MG PO TABS
20.0000 mg | ORAL_TABLET | Freq: Every day | ORAL | Status: DC
  Administered 2024-04-11 – 2024-04-12 (×2): 20 mg via ORAL
  Filled 2024-04-10 (×2): qty 1

## 2024-04-10 MED ORDER — DOXEPIN HCL 10 MG PO CAPS
10.0000 mg | ORAL_CAPSULE | Freq: Every day | ORAL | Status: DC
  Administered 2024-04-10 – 2024-04-11 (×2): 10 mg via ORAL
  Filled 2024-04-10 (×3): qty 1

## 2024-04-10 MED ORDER — DULOXETINE HCL 60 MG PO CPEP
60.0000 mg | ORAL_CAPSULE | Freq: Every day | ORAL | Status: DC
  Administered 2024-04-11 – 2024-04-12 (×2): 60 mg via ORAL
  Filled 2024-04-10 (×2): qty 1

## 2024-04-10 MED ORDER — NITROGLYCERIN 2 % TD OINT
0.5000 [in_us] | TOPICAL_OINTMENT | Freq: Four times a day (QID) | TRANSDERMAL | Status: DC
  Administered 2024-04-10 – 2024-04-11 (×5): 0.5 [in_us] via TOPICAL
  Filled 2024-04-10: qty 1
  Filled 2024-04-10: qty 30
  Filled 2024-04-10 (×2): qty 1

## 2024-04-10 MED ORDER — METOPROLOL SUCCINATE ER 50 MG PO TB24
50.0000 mg | ORAL_TABLET | Freq: Every day | ORAL | Status: DC
  Administered 2024-04-11 – 2024-04-12 (×2): 50 mg via ORAL
  Filled 2024-04-10: qty 2
  Filled 2024-04-10: qty 1

## 2024-04-10 MED ORDER — EZETIMIBE 10 MG PO TABS
10.0000 mg | ORAL_TABLET | Freq: Every day | ORAL | Status: DC
  Administered 2024-04-11 – 2024-04-12 (×2): 10 mg via ORAL
  Filled 2024-04-10 (×2): qty 1

## 2024-04-10 MED ORDER — METOPROLOL SUCCINATE ER 25 MG PO TB24
25.0000 mg | ORAL_TABLET | Freq: Once | ORAL | Status: AC
  Administered 2024-04-10: 25 mg via ORAL
  Filled 2024-04-10: qty 1

## 2024-04-10 MED ORDER — ADULT MULTIVITAMIN W/MINERALS CH
1.0000 | ORAL_TABLET | Freq: Every day | ORAL | Status: DC
  Administered 2024-04-11 – 2024-04-12 (×2): 1 via ORAL
  Filled 2024-04-10 (×2): qty 1

## 2024-04-10 MED ORDER — BUPROPION HCL ER (XL) 150 MG PO TB24
150.0000 mg | ORAL_TABLET | Freq: Every morning | ORAL | Status: DC
  Administered 2024-04-11 – 2024-04-12 (×2): 150 mg via ORAL
  Filled 2024-04-10 (×2): qty 1

## 2024-04-10 MED ORDER — ANASTROZOLE 1 MG PO TABS
1.0000 mg | ORAL_TABLET | Freq: Every day | ORAL | Status: DC
  Administered 2024-04-11 – 2024-04-12 (×2): 1 mg via ORAL
  Filled 2024-04-10 (×2): qty 1

## 2024-04-10 MED ORDER — HYDRALAZINE HCL 25 MG PO TABS
25.0000 mg | ORAL_TABLET | Freq: Three times a day (TID) | ORAL | Status: DC
  Administered 2024-04-10 – 2024-04-12 (×4): 25 mg via ORAL
  Filled 2024-04-10 (×4): qty 1

## 2024-04-10 MED ORDER — TIOTROPIUM BROMIDE MONOHYDRATE 18 MCG IN CAPS: 1.0000 | ORAL_CAPSULE | Freq: Every day | RESPIRATORY_TRACT | Status: DC

## 2024-04-10 MED ORDER — INSULIN GLARGINE-YFGN 100 UNIT/ML ~~LOC~~ SOLN
5.0000 [IU] | Freq: Every day | SUBCUTANEOUS | Status: DC
  Administered 2024-04-11 – 2024-04-12 (×2): 5 [IU] via SUBCUTANEOUS
  Filled 2024-04-10 (×2): qty 0.05

## 2024-04-10 NOTE — Assessment & Plan Note (Signed)
 Chronic kidney disease with fluctuating renal function Will have labs checked upon arrival to the ER. Will need at least 4 hours precath hydration in the morning of 04/11/2024 as part of precath orders. Minimal contrast use.

## 2024-04-10 NOTE — Assessment & Plan Note (Signed)
 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  Most recent cath showed LIMA to LAD and SVG to RI widely patent with occluded SVG-OM.  Plan to complete revascularization by moving forward with atherectomy and PCI of the LM-ostial and proximal LCx.

## 2024-04-10 NOTE — ED Notes (Signed)
 Patient refused phlebotomy attempt, stated would only allow port placement. RN [employer] and PA [place] notified.

## 2024-04-10 NOTE — ED Notes (Signed)
 Labs drawn @ [phone]

## 2024-04-10 NOTE — Assessment & Plan Note (Addendum)
 I am concerned that she is now having angina with minimal activity and even at rest.  She has severe distal left main into ostial LCx disease with a known occluded SVG to OM leaving a large territory of myocardium in jeopardy.  She seemed to do okay during her recovery from cardiomyopathy, but now that she is feeling better from that standpoint she is now more active and is now starting to note more angina symptoms with less exertion.  She is actually noticing discomfort here today.  Coronary artery disease with severe left main and circumflex stenosis, post-CABG, presenting with refractory angina pectoris Severe stenosis with refractory angina. Previous CABG with closed grafts. High calcium  complicates stenting. High-risk procedure due to tight angle and calcium . - Increased isosorbide  to full tablet daily => if admitted to the hospital, would consider nitroglycerin  infusion if she is having ongoing symptoms. - Plan to increase Toprol  XL to 50 mg daily to reduce heart rate and alleviate angina. - Scheduled atherectomy based PCI (had initially planned for this to be done on 04/13/2024, however with her ongoing symptoms, I feel it is more prudent to proceed sooner. Case discussed with Dr. Deatrice Cage here in clinic who came to meet the patient.  He has agreed to proceed with atherectomy-PCI on 04/11/2024. Case scheduled for roughly noon 04/11/2024. Recommend loading with Brilinta 300 mg p.o. tonight (04/10/2024) - Plan was to try to arrange for direct admission, however beds are unavailable, we will therefore have her report to the emergency room based on progressive/refractory angina at rest.  For admission: If she is actively having chest pain, would start IV nitroglycerin  and IV heparin .  Otherwise with consider nitroglycerin  paste and lieu of Imdur .  Post PCI she may not require Imdur . Loaded with Brilinta 300 mg  PM 04/10/2024 She will need precath hydration roughly 4 hours patient on renal  insufficiency.  This would mean starting at roughly 7 or 8 AM on 04/11/2024. 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 She expresses significant anxiety and preference for sedation during the procedure. - Will administer Twilight sedation during procedure to alleviate anxiety.

## 2024-04-10 NOTE — Progress Notes (Signed)
 PHARMACY - ANTICOAGULATION CONSULT NOTE  Pharmacy Consult for heparin  Indication: chest pain/ACS  Allergies  Allergen Reactions   Latex Rash and Dermatitis   Metformin  And Related Diarrhea    Patient Measurements:    Vital Signs: Temp: 98.3 F (36.8 C) (11/18 1422) Temp Source: Oral (11/18 1422) BP: 159/76 (11/18 1422) Pulse Rate: 79 (11/18 1422)  Labs: Recent Labs    04/10/24 1517 04/10/24 1647  HGB 12.3  --   HCT 38.7  --   PLT 235  --   CREATININE 1.40*  --   TROPONINIHS 32* 32*    Estimated Creatinine Clearance: 40.8 mL/min (A) (by C-G formula based on SCr of 1.4 mg/dL (H)).   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   COPD (chronic obstructive pulmonary disease) (HCC)    Depression    Essential hypertension    Hyperlipidemia    Myocardial infarction (HCC)    [place] to the LAD   OSA (obstructive sleep apnea)    CPAP qHS   PONV (postoperative nausea and vomiting)    Type 2 diabetes mellitus (HCC)     Medications:  (Not in a hospital admission)  Infusions:   heparin       Assessment: Patient is a 73 year old female sent to the ED from PCP due to progressively worsening chest pain now present at rest. Plans for cath tomorrow. Patient is not on thinners. Pharmacy is consulted for heparin .   CBC WNL. No concern for bleeding.   Goal of Therapy:  Heparin  level 0.3-0.7 units/ml Monitor platelets by anticoagulation protocol: Yes   Plan:  Give 4000 units bolus x 1 Followed by 1300 units/hr.  Heparin  level in 8 hours  Monitor for time of Cath  Vermell HERO Lamonica Trueba 04/10/2024,6:08 PM

## 2024-04-10 NOTE — ED Provider Triage Note (Signed)
 Emergency Medicine Provider Triage Evaluation Note  Katelyn Kaufman , a 73 y.o. female  was evaluated in triage.  Pt complains of intermittent substernal chest pain for the last week.  She is followed by cardiology who she met with earlier today for continued chest pain despite nitroglycerin  use and they recommended she come to our facility for preadmission for scheduled coronary atherectomy procedure tomorrow.  She has a known blockage and due to the position of this blockage she was recommended to be under monitoring due to continued chest pain in this setting, per patient.  She last took prescribed nitroglycerin  last night and has taken none today.  She reports shortness of breath with exertion x1 week.  She reports present substernal dull to heavy chest pain at this time that is no worse than it has been in the past.  She is seen by cardiologist Dr. Gatha Cage.  Review of Systems  Positive: Chest pain.  Shortness of breath with exertion. Negative: Dizziness, palpitations, N/V/D, syncope, abdominal pain  Physical Exam  BP (!) 159/76   Pulse 79   Temp 98.3 F (36.8 C) (Oral)   Resp 20   SpO2 97%  Gen:   Awake, no distress .  Seated upright in wheelchair Resp:  Normal effort with clear lung sounds throughout MSK:   Moves extremities without difficulty  Other:  Pulse regular rhythm and rate.  Medical Decision Making  Medically screening exam initiated at 2:57 PM.  Appropriate orders placed.  Katelyn Kaufman was informed that the remainder of the evaluation will be completed by another provider, this initial triage assessment does not replace that evaluation, and the importance of remaining in the ED until their evaluation is complete.   Katelyn Almarie LABOR, PA-C 04/10/24 1503

## 2024-04-10 NOTE — ED Provider Notes (Signed)
 [place] EMERGENCY DEPARTMENT AT [facility] Provider Note   CSN: [id number] Arrival date & time: 04/10/24  1415     Patient presents with: Chest Pain   Katelyn Kaufman is a 73 y.o. female with past medical history of Type 2 diabetes, hyperlipidemia, coronary artery disease, status post CABG, chronic kidney disease, history of NSTEMI, COPD presenting with CP. Patient seen by Dr Anner and PA Dunn today.  She was sent to ED and will ultimately require admission for cath tomorrow.  Patient has started having chest pain that is present now at rest.  Chest pain has persistent over the last week and seems to be worse with activity and relieved by rest and nitroglycerin .  She tells me her chest pain is better today than yesterday.  Reports mild shortness of breath.  Denies any fever or cough.    Chest Pain      Prior to Admission medications   Medication Sig Start Date End Date Taking? Authorizing Provider  Accu-Chek Softclix Lancets lancets Use 1 each as directed. Use up to four times daily as directed. 03/09/24   Gonfa, Taye T, MD  acetaminophen  (TYLENOL ) 325 MG tablet Take 2 tablets (650 mg total) by mouth every 6 (six) hours as needed for mild pain (or Fever >/= 101). 07/30/22   Pearlean Manus, MD  anastrozole  (ARIMIDEX ) 1 MG tablet Take 1 tablet (1 mg total) by mouth daily. 03/23/24   Geofm Delon BRAVO, NP  aspirin  EC 81 MG tablet Take 1 tablet (81 mg total) by mouth daily. Swallow whole. 11/23/21   Dwan Kyla HERO, PA-C  Blood Glucose Monitoring Suppl (BLOOD GLUCOSE MONITOR SYSTEM) w/Device KIT Use 1 each  in the morning, at noon, and at bedtime. 03/09/24   Gonfa, Taye T, MD  buPROPion (WELLBUTRIN XL) 150 MG 24 hr tablet Take 150 mg by mouth every morning. 02/03/24 02/02/25  [provider]  Continuous Glucose Receiver (DEXCOM G7 RECEIVER) DEVI USE TO MONITOR BLOOD GLUCOSE CONTINUOSLY    [provider]  doxepin (SINEQUAN) 10 MG capsule Take 10 mg by  mouth at bedtime. 03/22/24 03/22/25  [provider]  DULoxetine  (CYMBALTA ) 60 MG capsule Take 60 mg by mouth daily.    [provider]  ezetimibe  (ZETIA ) 10 MG tablet Take 1 tablet (10 mg total) by mouth daily. 10/27/23   Krishnan, Gokul, MD  fexofenadine (ALLEGRA) 180 MG tablet Take 180 mg by mouth daily.    [provider]  furosemide  (LASIX ) 40 MG tablet Take 1 tablet (40 mg total) by mouth daily as needed (for weight gains more than 3 pounds in 1 day or 5 pounds in 1 week.). 03/09/24   Gonfa, Taye T, MD  gabapentin  (NEURONTIN ) 300 MG capsule Take 300 mg by mouth 3 (three) times daily. 01/06/24   [provider]  hydrALAZINE  (APRESOLINE ) 25 MG tablet Take 1 tablet (25 mg total) by mouth every 8 (eight) hours. 03/09/24 06/07/24  Gonfa, Taye T, MD  insulin  glargine (LANTUS ) 100 UNIT/ML Solostar Pen Inject 45 Units into the skin daily. 03/09/24   Gonfa, Taye T, MD  insulin  lispro (HUMALOG) 100 UNIT/ML KwikPen Inject 10 Units into the skin 3 (three) times daily. 03/09/24   Kathrin Mignon DASEN, MD  Insulin  Pen Needle (INSUPEN PEN NEEDLES) 32G X 4 MM MISC Use to inject insulin  into the skin up to 4 times daily as directed. 03/09/24   Gonfa, Taye T, MD  Insulin  Pen Needle (INSUPEN PEN NEEDLES) 32G X 4  MM MISC Inject 1 each into the skin 4 (four) times daily -  before meals and at bedtime. 03/09/24   Gonfa, Taye T, MD  isosorbide  mononitrate (IMDUR ) 30 MG 24 hr tablet Take 0.5 tablets (15 mg total) by mouth daily. 03/10/24 06/08/24  Gonfa, Taye T, MD  metoprolol  succinate (TOPROL -XL) 25 MG 24 hr tablet Take 1 tablet (25 mg total) by mouth daily. 03/10/24   Gonfa, Taye T, MD  montelukast (SINGULAIR) 10 MG tablet Take 10 mg by mouth at bedtime. 02/03/24 02/02/25  [provider]  Multiple Vitamin (MULTI-VITAMIN) tablet Take 1 tablet by mouth daily.    [provider]  nitroGLYCERIN  (NITROSTAT ) 0.4 MG SL tablet Place 1 tablet (0.4 mg total) under the tongue every 5  (five) minutes x 3 doses as needed for chest pain (if no relief after 3rd dose, proceed to ED or call 911). 07/08/22   Debera Jayson MATSU, MD  ondansetron  (ZOFRAN -ODT) 4 MG disintegrating tablet Take 1 tablet (4 mg total) by mouth every 8 (eight) hours as needed for vomiting or nausea. For 7 days supply 09/15/23   Katragadda, Sreedhar, MD  pregabalin  (LYRICA ) 50 MG capsule Take 1 capsule (50 mg total) by mouth 3 (three) times daily as needed. 05/31/23   Onita Duos, MD  rosuvastatin  (CRESTOR ) 20 MG tablet Take 1 tablet (20 mg total) by mouth daily. 03/09/24   Gonfa, Taye T, MD  senna-docusate (SENOKOT-S) 8.6-50 MG tablet Take 1-2 tablets by mouth 2 (two) times daily between meals as needed for mild constipation or moderate constipation. 03/09/24   Gonfa, Taye T, MD  tiotropium (SPIRIVA ) 18 MCG inhalation capsule Place 1 capsule into inhaler and inhale daily. 02/03/24 02/02/25  [provider]    Allergies: Latex and Metformin  and related    Review of Systems  Cardiovascular:  Positive for chest pain.    Updated Vital Signs BP (!) 159/76   Pulse 79   Temp 98.3 F (36.8 C) (Oral)   Resp 20   SpO2 97%   Physical Exam Vitals and nursing note reviewed.  Constitutional:      General: She is not in acute distress.    Appearance: She is not toxic-appearing.  HENT:     Head: Normocephalic and atraumatic.  Eyes:     General: No scleral icterus.    Conjunctiva/sclera: Conjunctivae normal.  Cardiovascular:     Rate and Rhythm: Normal rate and regular rhythm.     Pulses: Normal pulses.     Heart sounds: Normal heart sounds.  Pulmonary:     Effort: Pulmonary effort is normal. No respiratory distress.     Breath sounds: Normal breath sounds.  Abdominal:     General: Abdomen is flat. Bowel sounds are normal.     Palpations: Abdomen is soft.     Tenderness: There is no abdominal tenderness.  Skin:    General: Skin is warm and dry.     Findings: No lesion.  Neurological:     General:  No focal deficit present.     Mental Status: She is alert and oriented to person, place, and time. Mental status is at baseline.     (all labs ordered are listed, but only abnormal results are displayed) Labs Reviewed  BASIC METABOLIC PANEL WITH GFR - Abnormal; Notable for the following components:      Result Value   Glucose, Bld 198 (*)    Creatinine, Ser 1.40 (*)    GFR, Estimated 40 (*)  All other components within normal limits  TROPONIN I (HIGH SENSITIVITY) - Abnormal; Notable for the following components:   Troponin I (High Sensitivity) 32 (*)    All other components within normal limits  CBC  TROPONIN I (HIGH SENSITIVITY)    EKG: EKG Interpretation Date/Time:  Tuesday April 10 2024 14:42:47 EST Ventricular Rate:  73 PR Interval:  144 QRS Duration:  82 QT Interval:  428 QTC Calculation: 471 R Axis:   84  Text Interpretation: Normal sinus rhythm ST & T wave abnormality, consider inferolateral ischemia Prolonged QT  Confirmed by Franklyn Gills [id number]) on 04/10/2024 2:45:15 PM  Radiology: [facility] Chest 2 View Result Date: 04/10/2024 CLINICAL DATA:  Chest pain. EXAM: CHEST - 2 VIEW COMPARISON:  Chest radiograph dated 03/08/2024. FINDINGS: No focal consolidation, pleural effusion or pneumothorax. Stable cardiac silhouette. Median sternotomy wires and CABG vascular clips. No acute osseous pathology. IMPRESSION: No active cardiopulmonary disease. Electronically Signed   By: Vanetta Chou M.D.   On: 04/10/2024 15:27     Procedures   Medications Ordered in the ED - No data to display                                  Medical Decision Making Amount and/or Complexity of Data Reviewed Labs: ordered. Radiology: ordered.   This patient presents to the ED for concern of chest pain, this involves an extensive number of treatment options, and is a complaint that carries with it a high risk of complications and morbidity.  The differential diagnosis includes ACS, stable  angina, CHF, pneumonia, pneumothorax, aortic dissection, pulmonary embolus    Co morbidities that complicate the patient evaluation  Type 2 diabetes, hyperlipidemia, coronary artery disease, status post CABG, chronic kidney disease, history of NSTEMI, COPD   Additional history obtained:  Additional history obtained from office visit with cardio today.  Patient does seem not to have angina at rest and she has severe distal left main disease and she was sent here for likely admission.   Lab Tests:  I personally interpreted labs.  The pertinent results include:   CBC no leukocytosis, no anemia. BMP baseline creatinine of 1.4  Troponin 32, delta 32   Imaging Studies ordered:  I ordered imaging studies including chest x-ray I independently visualized and interpreted imaging which showed no acute findings.  I agree with the radiologist interpretation   Cardiac Monitoring: / EKG:  The patient was maintained on a cardiac monitor.  I personally viewed and interpreted the cardiac monitored which showed an underlying rhythm of: inferior st changes, no stemi    Consultations Obtained:  I requested consultation with the cardiology,  and discussed lab and imaging findings as well as pertinent plan - they recommend: admit, okay to eat. Cardiology has placed orders for admission.   Problem List / ED Course / Critical interventions / Medication management  Patient reports to emergency room with chest pain.  She does have some ST changes but no STEMI.  Her troponin is elevated at 32, will obtain repeat troponin.  Patient is hemodynamically stable. Pulse and BP equal in both arms. Mild swelling of feet and ankles. Last echo showed EF of 60 to 65%.  No sign of fluid overload on chest x-ray. Continues to have mild chest pain and cardiology has seen her here in ED and is recommending nitroglycerin  paste for current chest pain and they are recommending admission and plan cath tomorrow.  I ordered  medication including nitroglycerin   Reevaluation of the patient after these medicines showed that the patient improved I have reviewed the patients home medicines and have made adjustments as needed Patient to be admitted by cardiology.       Final diagnoses:  Other chest pain    ED Discharge Orders     None          Shermon Warren SAILOR, PA-C 04/10/24 1752    Elnor Bernarda SQUIBB, DO 04/11/24 0007

## 2024-04-10 NOTE — Assessment & Plan Note (Signed)
 Type 2 diabetes with episodes of hypoglycemia. Insulin  doses reduced to 10 units for both Lantus  and three times daily lispro insulin . - Continue current insulin  regimen with reduced doses. Anticipate holding Lantus  pre-PCI as part of precath orders.  Lipid panel shows LDL 38 on current regimen Zetia  10 mg plus Crestor  20 mg. -Continue combination of Zetia  and Crestor .

## 2024-04-10 NOTE — ED Triage Notes (Signed)
 Pt to er, pt states that she has a blockage in her heart, states that she is scheduled to have a heart cath tomorrow, but since she started to have chest pain, she wanted to be seen, pt states that she was getting in the bath when the pain started. Pt eating pork rinds, advised pt to stop eating incase she needed to have a procedure.

## 2024-04-10 NOTE — Assessment & Plan Note (Addendum)
 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  Fortunately the graft to the LCx and LAD are widely patent, however the SVG to the LCx is occluded and there is a severe ostial LCx disease in addition to the distal LM there is heavily calcified over 90% stenosed.  Plan was for staged atherectomy PCI which will now be moved up to 04/11/2024 with Dr. Deatrice Cage. With progressive refractory angina symptoms, we recommended that she go to the ER for evaluation (see angina at rest section) She is scheduled for atherectomy PCI with Dr. Deatrice Cage on 04/11/2024 Will need precath hydration in the AM. BP mildly elevated today, would increase hydralazine  to 50 mg 3 times daily and Toprol  to 50 mg daily Once renal function stabilized and no further contrast requirement is anticipated, would consider switching from hydralazine  back to ARB/ARNI Lipid panel shows well-controlled lipids on 10 mg Zetia  and 20 mg of Crestor  He is currently on aspirin  and we will load with Brilinta prior to PCI (Brilinta chosen because of her ongoing insulin -dependent diabetes)

## 2024-04-10 NOTE — Assessment & Plan Note (Signed)
 Heart failure with previously reduced ejection fraction, now recovered to normal.  Ejection fraction recovery allows for more aggressive management of coronary artery disease.  Because of renal insufficiency, not currently on ARB/ACE inhibitor/MRA or ARNI.-Once her renal function stabilizes she is not can to be having any contrast procedures, would strongly consider converting from hydralazine  to ARNI.  For now,  Continue with hydralazine  but would increase to 50 mg 3 times daily post-hospitalization She is currently on Toprol  25 mg a day, anticipate either titrating up to 50 mg daily versus converting to carvedilol if additional blood pressure control required She is diabetic on insulin .  With EF back to normal, will hold off on SGLT2 inhibitor. She is on Lasix  which she is taking 40 mg as a standing dose.  Would continue at 40 mg standing dose but okay to double up if she has worsening edema, PND, orthopnea or weight gain more than 3 pounds.

## 2024-04-10 NOTE — H&P (Addendum)
 Case discussed with Dr. Anner. Please see his note below which serves as this patient's admission H/P. Saw patient in triage. She is feeling OK, has vague awareness of chest discomfort but states this is in the context of all over body arthritis pain. Eager to have something to eat, has been eating pork rinds, asking for water . First troponin 32, f/u pending. She would like to defer the IV NTG and try NTG paste instead. Will otherwise carry out orders as below. Other notable comments: - took all AM meds already including ASA - I clarified with Dr. Anner he intended note to reflect Brilinta 180mg  load not 300mg  then start 90mg  BID in AM - Patient reports she takes gabapentin  but not pregabalin , will order accordingly - Patient clarifies she takes Lantus  10 units QAM not 45 units QAM -> will decrease Lantus  to 5u daily given plan for cath + SSI - code status: full code - Cath scheduled for 1230pm. She can eat today then clear liquids until 5am, NPO except sips with meds thereafter.  Raphael LOISE Bring, PA-C  -----------------  Cardiology Office Note:  .   Date:  04/10/2024  ID:  Katelyn Kaufman, DOB [birth year], MRN [id number] PCP: [facility]   [facility] Providers Cardiologist:  Jayson Sierras, MD          Chief Complaint  Patient presents with   Hospitalization Follow-up      Interventional cardiology consultation   Coronary Artery Disease      Chest pain at rest this morning and no more chest pain while walking into the clinic today.  Now having more prominent angina symptoms.   Cardiomyopathy      EF improved by echocardiogram,    Congestive Heart Failure      Despite improvement in echo,still has intermittent days of swelling and orthopnea      Patient Profile: .     Narrative History Katelyn Kaufman is a moderately obese 73 y.o. 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 Sheppard Sierras.Katelyn Kaufman was l recently admitted to most current hospital (03/02/2024) for dehydration and AKI and found to have acute viral illness complicated by Takotsubo cardiomyopathy with severely reduced EF on echo.  She was referred for right and FFR catheterization which revealed severely elevated pressures and severe distal left main-LCx stenosis along with occluded RI and LAD.  Unfortunately the SVG-LCx was no longer patent.  Plan was to consider staged PCI of the distal left main into LCx once she was recovered from CHF standpoint.  She was started on GDMT limited by her renal insufficiency while in the hospital, and has subsequently had a follow-up echocardiogram showing resolution of the cardiomyopathy with EF now back in the 60-65% range.  As such with ongoing symptoms she is referred for interventional cardiology consultation to discuss PCI options.   Post Hospital follow-up echocardiogram showed normalization of EF and stabilization of CHF symptoms.  Plan was for referral to discuss staged PCI of the LM-LCx EF preserved.   Subjective Discussed the use of AI scribe software for clinical note transcription with the patient, who gave verbal consent to proceed.   History of  Present Illness She has been experiencing persistent chest pain over the past week, which is exacerbated by physical activity and temporarily relieved by nitroglycerin . The frequency of the pain has increased, occurring daily, and it significantly bothers her.  She mostly has the chest pains when she is walking, especially if she is walking up an incline but also just has extreme fatigue and dyspnea.  She has actually had some episodes of chest discomfort  simply getting upset and not with any activity.   Her history of heart failure was exacerbated by influenza in October, leading to a decrease in her heart's pump function. She was previously diagnosed with stress-induced cardiomyopathy.   She experiences episodes of swelling, particularly in her legs, which she attributes to fluid retention. She takes furosemide  40 mg daily to manage this. She also experiences shortness of breath when lying flat and upon waking, which she associates with her heart condition.   Her current medications include isosorbide , aspirin  81 mg daily, Zetia  10 mg daily, Toprol  25 mg daily, Crestor  20 mg daily, and reduced doses of insulin  (Lantus  and another insulin  taken three times a day). She also takes gabapentin .   She feels weak in the mornings, which she attributes to low blood sugar levels. She has been experiencing more frequent headaches and sweating episodes, particularly at night. She also mentions anxiety and difficulty sleeping, which she attributes to her current health issues.     Cardiovascular ROS: positive for - chest pain, dyspnea on exertion, edema, orthopnea, paroxysmal nocturnal dyspnea, shortness of breath, and more fatigue-the stress of her symptoms is causing her to lose sleep; lightheadedness and weakness Brabham hypoglycemia. negative for - irregular heartbeat, palpitations, rapid heart rate, or syncope/near syncope, TIA/amaurosis fugax or claudication   ROS:  Review of Systems - Negative except symptoms noted above but also notable for arthritis pains and significant anxiety     Objective     Current Meds     Cardiac/DM Meds  Medication Sig   aspirin  EC 81 MG tablet Take 1 tablet (81 mg total) by mouth daily. Swallow whole.   ezetimibe  (ZETIA ) 10 MG tablet Take 1 tablet (10 mg total) by mouth daily.   furosemide  (LASIX ) 40 MG tablet Take 1 tablet (40 mg total) by mouth daily as needed (for weight gains more than 3 pounds in 1 day or 5  pounds in 1 week.).   gabapentin  (NEURONTIN ) 300 MG capsule Take 300 mg by mouth 3 (three) times daily.   hydrALAZINE  (APRESOLINE ) 25 MG tablet Take 1 tablet (25 mg total) by mouth every 8 (eight) hours.   insulin  glargine (LANTUS ) 100 UNIT/ML Solostar Pen Inject 45 Units into the skin daily.--Per patient's report, this is also dropped to 10 mg   insulin  lispro (HUMALOG) 100 UNIT/ML KwikPen Inject 10 Units into the skin 3 (three) times daily.   isosorbide  mononitrate (IMDUR ) 30 MG 24 hr tablet Take 0.5 tablets (15 mg total) by mouth daily.   metoprolol  succinate (TOPROL -XL) 25 MG 24 hr tablet Take 1 tablet (25 mg total) by mouth daily.   Multiple Vitamin (MULTI-VITAMIN) tablet Take 1 tablet by mouth daily.   nitroGLYCERIN  (NITROSTAT ) 0.4 MG SL tablet Place 1 tablet (0.4 mg total) under the tongue every 5 (five) minutes x 3 doses as needed for chest pain (if no relief after 3rd dose, proceed to ED or call 911).   rosuvastatin  (CRESTOR ) 20 MG tablet Take 1 tablet (20 mg total) by mouth daily.    Medication Sig  acetaminophen  (TYLENOL ) 325 MG tablet Take 2 tablets (650 mg total) by mouth every 6 (six) hours as needed for mild pain (or Fever >/= 101).   anastrozole  (ARIMIDEX ) 1 MG tablet Take 1 tablet (1 mg total) by mouth daily.   buPROPion (WELLBUTRIN XL) 150 MG 24 hr tablet Take 150 mg by mouth every morning.   doxepin (SINEQUAN) 10 MG capsule Take 10 mg by mouth at bedtime.   DULoxetine  (CYMBALTA ) 60 MG capsule Take 60 mg by mouth daily.   fexofenadine (ALLEGRA) 180 MG tablet Take 180 mg by mouth daily.   gabapentin  (NEURONTIN ) 300 MG capsule Take 300 mg by mouth 3 (three) times daily.   montelukast (SINGULAIR) 10 MG tablet Take 10 mg by mouth at bedtime.   Multiple Vitamin (MULTI-VITAMIN) tablet Take 1 tablet by mouth daily.   ondansetron  (ZOFRAN -ODT) 4 MG disintegrating tablet Take 1 tablet (4 mg total) by mouth every 8 (eight) hours as needed for vomiting or nausea. For 7 days supply    pregabalin  (LYRICA ) 50 MG capsule Take 1 capsule (50 mg total) by mouth 3 (three) times daily as needed.   senna-docusate (SENOKOT-S) 8.6-50 MG tablet Take 1-2 tablets by mouth 2 (two) times daily between meals as needed for mild constipation or moderate constipation.   tiotropium (SPIRIVA ) 18 MCG inhalation capsule Place 1 capsule into inhaler and inhale daily.          Past Medical History:  Diagnosis Date   Allergy     Anxiety     Arthritis     Breast cancer (HCC)     CAD (coronary artery disease) 2018    Severe trifurcation disease involving left main, ostial circumflex, 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   COPD (chronic obstructive pulmonary disease) (HCC)     Depression     Essential hypertension     Hyperlipidemia     Myocardial infarction (HCC)      [place] to the LAD   OSA (obstructive sleep apnea)      CPAP qHS   PONV (postoperative nausea and vomiting)     Type 2 diabetes mellitus (HCC)               Past Surgical History:  Procedure Laterality Date   BACK SURGERY       BREAST SURGERY       CATARACT EXTRACTION W/PHACO Right 10/24/2020    Procedure: CATARACT EXTRACTION PHACO AND INTRAOCULAR LENS PLACEMENT (IOC);  Surgeon: Harrie Agent, MD;  Location: [facility];  Service: Ophthalmology;  Laterality: Right;  CDE: 10.87   CATARACT EXTRACTION W/PHACO Left 12/01/2020    Procedure: CATARACT EXTRACTION PHACO AND INTRAOCULAR LENS PLACEMENT LEFT EYE;  Surgeon: Harrie Agent, MD;  Location: [facility];  Service: Ophthalmology;  Laterality: Left;  CDE  8.62   CORONARY ARTERY BYPASS GRAFT N/A 11/16/2021    Procedure: CORONARY ARTERY BYPASS GRAFTING (CABG) times three using the left internal mammary and right saphenous vein.;  Surgeon: Lucas Dorise POUR, MD;  Location: [facility] OR;  Service: Open Heart Surgery;  Laterality: N/A;   IABP INSERTION N/A 11/16/2021    Procedure: IABP Insertion;  Surgeon: Anner Alm ORN,  MD;  Location: [facility] INVASIVE CV LAB;  Service: Cardiovascular;  Laterality: N/A;   LEFT HEART CATH AND CORONARY ANGIOGRAPHY N/A 11/16/2021    Procedure: LEFT HEART CATH AND CORONARY ANGIOGRAPHY;  Surgeon: Anner Alm ORN, MD;  Location: [facility] INVASIVE CV  LAB;  Service: Cardiovascular;  Laterality: N/A;   REPLACEMENT TOTAL KNEE Right     RIGHT/LEFT HEART CATH AND CORONARY/GRAFT ANGIOGRAPHY N/A 03/06/2024    Procedure: RIGHT/LEFT HEART CATH AND CORONARY/GRAFT ANGIOGRAPHY;  Surgeon: Anner Alm ORN, MD;  Location: [facility] INVASIVE CV LAB;  Service: Cardiovascular;  Laterality: N/A;   SIMPLE MASTECTOMY WITH AXILLARY SENTINEL NODE BIOPSY Left 06/25/2022    Procedure: SIMPLE MASTECTOMY;  Surgeon: Mavis Anes, MD;  Location: [facility];  Service: General;  Laterality: Left;   TEE WITHOUT CARDIOVERSION N/A 11/16/2021    Procedure: TRANSESOPHAGEAL ECHOCARDIOGRAM (TEE);  Surgeon: Lucas Dorise POUR, MD;  Location: [facility] OR;  Service: Open Heart Surgery;  Laterality: N/A;   TUBAL LIGATION       WOUND EXPLORATION Left 07/26/2022    Procedure: EVACUATION OF HEMATOMA BREAST, DEBRIDEMENT OF LEFT BREAST WOUND;  Surgeon: Mavis Anes, MD;  Location: [facility];  Service: General;  Laterality: Left;        family history includes Alcohol abuse in her father; COPD in her mother; Heart attack in her brother; Hypertension in her sister. Social History  Social History         Tobacco Use   Smoking status: Every Day      Current packs/day: 0.50      Average packs/day: 0.5 packs/day for 40.0 years (20.0 ttl pk-yrs)      Types: Cigarettes   Smokeless tobacco: Never  Vaping Use   Vaping status: Never Used  Substance Use Topics   Alcohol use: Never   Drug use: Never        Studies Reviewed: Katelyn   EKG Interpretation Date/Time:                           Tuesday April 10 2024 10:28:24 EST Ventricular Rate:  80 PR Interval:                         148 QRS Duration:                     80 QT Interval:                          412 QTC Calculation:475 R Axis:                                 11   Text Interpretation:Normal sinus rhythm ST & T wave abnormality, consider inferior ischemia ST & T wave abnormality, consider anterolateral ischemia Prolonged QT When compared with ECG of 08-Mar-2024 15:56, Inverted T waves have replaced nonspecific T wave abnormality in Inferior leads Confirmed by Anner Alm ([id number]) on 04/10/2024 10:31:03 AM     Recent Labs       Lab Results  Component Value Date    CHOL 93 01/09/2024    HDL 37 (L) 01/09/2024    LDLCALC 38 01/09/2024    TRIG 92 01/09/2024    CHOLHDL 2.5 01/09/2024      Recent Labs       Lab Results  Component Value Date    NA 136 03/09/2024    CL 104 03/09/2024    K 4.1 03/09/2024    CO2 23 03/09/2024    BUN 16 03/09/2024    CREATININE 1.57 (H) 03/09/2024    GFRNONAA 35 (L) 03/09/2024    CALCIUM   10.6 (H) 03/09/2024    PHOS 2.5 01/09/2024    ALBUMIN  4.1 03/02/2024    GLUCOSE 190 (H) 03/09/2024      Recent Labs       Lab Results  Component Value Date    WBC 7.3 03/08/2024    HGB 11.7 (L) 03/08/2024    HCT 34.4 (L) 03/08/2024    MCV 94.2 03/08/2024    PLT 192 03/08/2024      -------------------   Myoview : LOW RISK.  No ischemia or infarction.  EF 61%.  (10/26/2023) Echocardiogram: Severely reduced EF of 25 to 30%.  Severely decreased function with likely Takotsubo pattern cardiomyopathy cannot exclude possible LAD ischemia.  Normal RAP.  Entire apex is akinetic and the anterior wall/anteroseptum are hypokinetic.  (03/05/2024) Echocardiogram: EF 60-65%.  No RWMA.  G1 DD.  Normal RAP.  (03/29/2024) R&LHC: Severe cardiomyopathy with cardiac output/index of 3.93 and 1.98.  However well compensated with LVEDP of 8 mmHg and PCWP of 6 mmHg.  Severe distal LM-ostial LCx/ostial RI disease with widely patent LIMA-LAD and SVG-RI. Likely flush occlusion of SVG-OM/LPL -> leaving the large LCx-OM compromised by a 99% eccentric stenosis. Consider staged protected  Left Main and LCx PCI once renal function is stabilized. Widely patent native RCA.  (03/06/2024)  Cardiac MRI: Findings consistent with Takotsubo cardiomyopathy.  Normal basal systolic function with mid to apical akinesis and elevated T2 values in mid to apical segments with no LGE.  Subendocardial LGE in the basal inferolateral wall consistent with small infarct.  Moderate systolic dysfunction with with EF of 38%.  Normal RV size and function.  EF 54%.  Lipomatous hypertrophy of the IAS.  (03/07/2024)     Risk Assessment/Calculations:             Physical Exam:   VS:  BP (!) 145/70 (BP Location: Left Arm, Patient Position: Sitting)   Pulse 80   Ht 5' 4 (1.626 m)   Wt 217 lb (98.4 kg)   SpO2 96%   BMI 37.25 kg/m       Wt Readings from Last 3 Encounters:  04/10/24 217 lb (98.4 kg)  03/09/24 215 lb 13.3 oz (97.9 kg)  02/29/24 196 lb 6.9 oz (89.1 kg)    GEN: Well nourished, well groomed in mild distress; nontoxic.  Moderately obese NECK: No JVD; No carotid bruits CARDIAC:  RRR,; Distant heart sounds but normal S1, S2; no murmurs, rubs, gallops RESPIRATORY:  Clear to auscultation without rales, wheezing or rhonchi ; nonlabored, good air movement. ABDOMEN: Soft, non-tender, non-distended EXTREMITIES:  No edema; No deformity; walks with a cane     ASSESSMENT AND PLAN: .     Problem List Items Addressed This Visit              Cardiology Problems    Anginal chest pain at rest - Primary    I am concerned that she is now having angina with minimal activity and even at rest.  She has severe distal left main into ostial LCx disease with a known occluded SVG to OM leaving a large territory of myocardium in jeopardy.   She seemed to do okay during her recovery from cardiomyopathy, but now that she is feeling better from that standpoint she is now more active and is now starting to note more angina symptoms with less exertion.  She is actually noticing discomfort here today.   Coronary  artery disease with severe left main and circumflex stenosis, post-CABG, presenting with refractory angina pectoris  Severe stenosis with refractory angina. Previous CABG with closed grafts. High calcium  complicates stenting. High-risk procedure due to tight angle and calcium . - Increased isosorbide  to full tablet daily => if admitted to the hospital, would consider nitroglycerin  infusion if she is having ongoing symptoms. - Plan to increase Toprol  XL to 50 mg daily to reduce heart rate and alleviate angina. - Scheduled atherectomy based PCI (had initially planned for this to be done on 04/13/2024, however with her ongoing symptoms, I feel it is more prudent to proceed sooner. Case discussed with Dr. Deatrice Cage here in clinic who came to meet the patient.  He has agreed to proceed with atherectomy-PCI on 04/11/2024. Case scheduled for roughly noon 04/11/2024. Recommend loading with Brilinta 300 mg p.o. tonight (04/10/2024) - Plan was to try to arrange for direct admission, however beds are unavailable, we will therefore have her report to the emergency room based on progressive/refractory angina at rest.   For admission: If she is actively having chest pain, would start IV nitroglycerin  and IV heparin .  Otherwise with consider nitroglycerin  paste and lieu of Imdur .  Post PCI she may not require Imdur . Loaded with Brilinta 300 mg  PM 04/10/2024 She will need precath hydration roughly 4 hours patient on renal insufficiency.  This would mean starting at roughly 7 or 8 AM on 04/11/2024. 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 She expresses significant anxiety and preference for sedation during the procedure. - Will administer Twilight sedation during procedure to alleviate anxiety.        Chronic heart failure with preserved ejection fraction (HFpEF) (HCC) (Chronic)    Heart failure with previously reduced ejection  fraction, now recovered to normal.  Ejection fraction recovery allows for more aggressive management of coronary artery disease.   Because of renal insufficiency, not currently on ARB/ACE inhibitor/MRA or ARNI.-Once her renal function stabilizes she is not can to be having any contrast procedures, would strongly consider converting from hydralazine  to ARNI.   For now,  Continue with hydralazine  but would increase to 50 mg 3 times daily post-hospitalization She is currently on Toprol  25 mg a day, anticipate either titrating up to 50 mg daily versus converting to carvedilol if additional blood pressure control required She is diabetic on insulin .  With EF back to normal, will hold off on SGLT2 inhibitor. She is on Lasix  which she is taking 40 mg as a standing dose.  Would continue at 40 mg standing dose but okay to double up if she has worsening edema, PND, orthopnea or weight gain more than 3 pounds.          Coronary artery disease involving native coronary artery of native heart with angina pectoris (Chronic)    Severe native vessel disease of the left main involving LAD, ramus and LCx.  Fortunately the graft to the LCx and LAD are widely patent, however the SVG to the LCx is occluded and there is a severe ostial LCx disease in addition to the distal LM there is heavily calcified over 90% stenosed.  Plan was for staged atherectomy PCI which will now be moved up to 04/11/2024 with Dr. Deatrice Cage. With progressive refractory angina symptoms, we recommended that she go to the ER for evaluation (see angina at rest section) She is scheduled for atherectomy PCI with Dr. Deatrice Cage on 04/11/2024 Will need precath hydration in the AM. BP mildly elevated today, would increase hydralazine   to 50 mg 3 times daily and Toprol  to 50 mg daily Once renal function stabilized and no further contrast requirement is anticipated, would consider switching from hydralazine  back to ARB/ARNI Lipid panel shows  well-controlled lipids on 10 mg Zetia  and 20 mg of Crestor  He is currently on aspirin  and we will load with Brilinta prior to PCI (Brilinta chosen because of her ongoing insulin -dependent diabetes)        Hyperlipidemia associated with type 2 diabetes mellitus (HCC) (Chronic)    Type 2 diabetes with episodes of hypoglycemia. Insulin  doses reduced to 10 units for both Lantus  and three times daily lispro insulin . - Continue current insulin  regimen with reduced doses. Anticipate holding Lantus  pre-PCI as part of precath orders.   Lipid panel shows LDL 38 on current regimen Zetia  10 mg plus Crestor  20 mg. -Continue combination of Zetia  and Crestor .        Takotsubo cardiomyopathy    Takotsubo cardiomyopathy the setting of influenza.  Confirmed by echocardiogram.  Cardiomyopathy seems to have resolved with follow-up echocardiogram showing normalization of EF and wall motion abnormality..            Other    CAD---S/P CABG x 3 in June 2023 (Chronic)    CABG performed due to severe distal left main involving the ostium of the LAD, LCx and RI.  Most recent cath showed LIMA to LAD and SVG to RI widely patent with occluded SVG-OM.   Plan to complete revascularization by moving forward with atherectomy and PCI of the LM-ostial and proximal LCx.        CKD (chronic kidney disease), stage III (HCC) (Chronic)    Chronic kidney disease with fluctuating renal function Will have labs checked upon arrival to the ER. Will need at least 4 hours precath hydration in the morning of 04/11/2024 as part of precath orders. Minimal contrast use.        Tobacco use disorder (Chronic)    Still smokes half pack a day.  I did not broach cessation with the patient today, but would recommend continued discussions on smoking cessation counseling.        Other Visit Diagnoses         Essential hypertension        Relevant Orders    EKG 12-Lead (Completed)              Informed Consent Shared Decision  Making/Informed Consent The risks [stroke (1 in 1000), death (1 in 1000), kidney failure [usually temporary] (1 in 500), bleeding (1 in 200), allergic reaction [possibly serious] (1 in 200)], benefits (diagnostic support and management of coronary artery disease) and alternatives of a cardiac catheterization were discussed in detail with Katelyn Kaufman and she is willing to proceed.        Follow-Up: No follow-ups on file.   I spent 71 minutes in the care of Katelyn MALVA Thermon today including reviewing labs (1 minute), reviewing studies (cardiac cath films reviewed, cardiac MRI and echocardiograms reviewed-total 11 minutes), face to face time discussing treatment options (35 minutes), reviewing records from recent hospitalization, and notes from Dr. Debera, and Almarie Crate, NP (5 minutes), dictating, and coordinating ER visit/direct hospitalization, discussing procedure with Dr. Deatrice Cage, and documenting in the encounter.          Signed, Alm MICAEL Clay, MD, MS Alm Clay, M.D., M.S. Interventional Cardiologist  [facility] Pager # [phone]

## 2024-04-10 NOTE — Assessment & Plan Note (Signed)
 Takotsubo cardiomyopathy the setting of influenza.  Confirmed by echocardiogram.  Cardiomyopathy seems to have resolved with follow-up echocardiogram showing normalization of EF and wall motion abnormality.SABRA

## 2024-04-10 NOTE — Assessment & Plan Note (Signed)
 Still smokes half pack a day.  I did not broach cessation with the patient today, but would recommend continued discussions on smoking cessation counseling.

## 2024-04-10 NOTE — ED Notes (Signed)
 CCMD states that they will change the Heart color. CCMD has been called for the patient.

## 2024-04-10 NOTE — Progress Notes (Signed)
 Cardiology Office Note:  .   Date:  04/10/2024  ID:  Katelyn Kaufman, DOB 08-26-1950, MRN [id number] PCP: [facility]  [facility] Providers Cardiologist:  Jayson Sierras, MD     Chief Complaint  Patient presents with   Hospitalization Follow-up    Interventional cardiology consultation   Coronary Artery Disease    Chest pain at rest this morning and no more chest pain while walking into the clinic today.  Now having more prominent angina symptoms.   Cardiomyopathy    EF improved by echocardiogram,    Congestive Heart Failure    Despite improvement in echo,still has intermittent days of swelling and orthopnea    Patient Profile: .     Katelyn Kaufman is a moderately obese 73 y.o. 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 Sheppard Sierras.Katelyn Kaufman was l recently admitted to most current hospital (03/02/2024) for dehydration and AKI and found to have acute viral illness complicated by Takotsubo cardiomyopathy with severely reduced EF on echo.  She was referred for right and FFR catheterization which revealed severely elevated pressures and severe distal left main-LCx stenosis along with occluded RI and LAD.  Unfortunately the SVG-LCx was no longer patent.  Plan was to consider staged PCI of the distal left main into LCx once she was recovered from CHF standpoint.  She was started on GDMT limited by her renal insufficiency while in the hospital, and has subsequently had a follow-up echocardiogram showing resolution of the cardiomyopathy with EF now back in the 60-65% range.  As such with ongoing symptoms she is referred for  interventional cardiology consultation to discuss PCI options.  Post Hospital follow-up echocardiogram showed normalization of EF and stabilization of CHF symptoms.  Plan was for referral to discuss staged PCI of the LM-LCx EF preserved.  Subjective  Discussed the use of AI scribe software for clinical note transcription with the patient, who gave verbal consent to proceed.  History of Present Illness She has been experiencing persistent chest pain over the past week, which is exacerbated by physical activity and temporarily relieved by nitroglycerin . The frequency of the pain has increased, occurring daily, and it significantly bothers her.  She mostly has the chest pains when she is walking, especially if she is walking up an incline but also just has extreme fatigue and dyspnea.  She has actually had some episodes of chest discomfort simply getting upset and not with any activity.  Her history of heart failure was exacerbated by influenza in October, leading to a decrease in her heart's pump function. She was previously diagnosed with stress-induced cardiomyopathy.  She experiences episodes of swelling, particularly in her legs, which she attributes to fluid retention. She takes furosemide  40 mg daily to manage this. She also experiences shortness of breath when lying flat and upon waking, which she associates with her heart condition.  Her current medications include isosorbide , aspirin  81 mg daily, Zetia  10 mg daily, Toprol  25 mg daily, Crestor  20 mg daily, and reduced doses of insulin  (Lantus  and another insulin  taken three times a day). She also takes gabapentin .  She feels weak in the mornings, which she attributes to  low blood sugar levels. She has been experiencing more frequent headaches and sweating episodes, particularly at night. She also mentions anxiety and difficulty sleeping, which she attributes to her current health issues.   Cardiovascular ROS: positive for - chest pain,  dyspnea on exertion, edema, orthopnea, paroxysmal nocturnal dyspnea, shortness of breath, and more fatigue-the stress of her symptoms is causing her to lose sleep; lightheadedness and weakness Brabham hypoglycemia. negative for - irregular heartbeat, palpitations, rapid heart rate, or syncope/near syncope, TIA/amaurosis fugax or claudication  ROS:  Review of Systems - Negative except symptoms noted above but also notable for arthritis pains and significant anxiety    Objective   Current Meds Cardiac/DM Meds  Medication Sig   aspirin  EC 81 MG tablet Take 1 tablet (81 mg total) by mouth daily. Swallow whole.   ezetimibe  (ZETIA ) 10 MG tablet Take 1 tablet (10 mg total) by mouth daily.   furosemide  (LASIX ) 40 MG tablet Take 1 tablet (40 mg total) by mouth daily as needed (for weight gains more than 3 pounds in 1 day or 5 pounds in 1 week.).   gabapentin  (NEURONTIN ) 300 MG capsule Take 300 mg by mouth 3 (three) times daily.   hydrALAZINE  (APRESOLINE ) 25 MG tablet Take 1 tablet (25 mg total) by mouth every 8 (eight) hours.   insulin  glargine (LANTUS ) 100 UNIT/ML Solostar Pen Inject 45 Units into the skin daily.--Per patient's report, this is also dropped to 10 mg   insulin  lispro (HUMALOG) 100 UNIT/ML KwikPen Inject 10 Units into the skin 3 (three) times daily.   isosorbide  mononitrate (IMDUR ) 30 MG 24 hr tablet Take 0.5 tablets (15 mg total) by mouth daily.   metoprolol  succinate (TOPROL -XL) 25 MG 24 hr tablet Take 1 tablet (25 mg total) by mouth daily.   Multiple Vitamin (MULTI-VITAMIN) tablet Take 1 tablet by mouth daily.   nitroGLYCERIN  (NITROSTAT ) 0.4 MG SL tablet Place 1 tablet (0.4 mg total) under the tongue every 5 (five) minutes x 3 doses as needed for chest pain (if no relief after 3rd dose, proceed to ED or call 911).   rosuvastatin  (CRESTOR ) 20 MG tablet Take 1 tablet (20 mg total) by mouth daily.    Medication Sig   acetaminophen  (TYLENOL ) 325 MG tablet Take 2 tablets (650 mg total)  by mouth every 6 (six) hours as needed for mild pain (or Fever >/= 101).   anastrozole  (ARIMIDEX ) 1 MG tablet Take 1 tablet (1 mg total) by mouth daily.   buPROPion (WELLBUTRIN XL) 150 MG 24 hr tablet Take 150 mg by mouth every morning.   doxepin (SINEQUAN) 10 MG capsule Take 10 mg by mouth at bedtime.   DULoxetine  (CYMBALTA ) 60 MG capsule Take 60 mg by mouth daily.   fexofenadine (ALLEGRA) 180 MG tablet Take 180 mg by mouth daily.   gabapentin  (NEURONTIN ) 300 MG capsule Take 300 mg by mouth 3 (three) times daily.   montelukast (SINGULAIR) 10 MG tablet Take 10 mg by mouth at bedtime.   Multiple Vitamin (MULTI-VITAMIN) tablet Take 1 tablet by mouth daily.   ondansetron  (ZOFRAN -ODT) 4 MG disintegrating tablet Take 1 tablet (4 mg total) by mouth every 8 (eight) hours as needed for vomiting or nausea. For 7 days supply   pregabalin  (LYRICA ) 50 MG capsule Take 1 capsule (50 mg total) by mouth 3 (three) times daily as needed.   senna-docusate (SENOKOT-S) 8.6-50 MG tablet Take 1-2 tablets by mouth 2 (two) times daily between meals as needed for mild constipation or moderate constipation.  tiotropium (SPIRIVA ) 18 MCG inhalation capsule Place 1 capsule into inhaler and inhale daily.    Past Medical History:  Diagnosis Date   Allergy    Anxiety    Arthritis    Breast cancer (HCC)    CAD (coronary artery disease) 2018   Severe trifurcation disease involving left main, ostial circumflex, 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   COPD (chronic obstructive pulmonary disease) (HCC)    Depression    Essential hypertension    Hyperlipidemia    Myocardial infarction (HCC)    [place] to the LAD   OSA (obstructive sleep apnea)    CPAP qHS   PONV (postoperative nausea and vomiting)    Type 2 diabetes mellitus (HCC)    Past Surgical History:  Procedure Laterality Date   BACK SURGERY     BREAST SURGERY     CATARACT EXTRACTION  W/PHACO Right 10/24/2020   Procedure: CATARACT EXTRACTION PHACO AND INTRAOCULAR LENS PLACEMENT (IOC);  Surgeon: Harrie Agent, MD;  Location: [facility];  Service: Ophthalmology;  Laterality: Right;  CDE: 10.87   CATARACT EXTRACTION W/PHACO Left 12/01/2020   Procedure: CATARACT EXTRACTION PHACO AND INTRAOCULAR LENS PLACEMENT LEFT EYE;  Surgeon: Harrie Agent, MD;  Location: [facility];  Service: Ophthalmology;  Laterality: Left;  CDE  8.62   CORONARY ARTERY BYPASS GRAFT N/A 11/16/2021   Procedure: CORONARY ARTERY BYPASS GRAFTING (CABG) times three using the left internal mammary and right saphenous vein.;  Surgeon: Lucas Dorise POUR, MD;  Location: [facility] OR;  Service: Open Heart Surgery;  Laterality: N/A;   IABP INSERTION N/A 11/16/2021   Procedure: IABP Insertion;  Surgeon: Anner Alm ORN, MD;  Location: [facility] INVASIVE CV LAB;  Service: Cardiovascular;  Laterality: N/A;   LEFT HEART CATH AND CORONARY ANGIOGRAPHY N/A 11/16/2021   Procedure: LEFT HEART CATH AND CORONARY ANGIOGRAPHY;  Surgeon: Anner Alm ORN, MD;  Location: [facility] INVASIVE CV LAB;  Service: Cardiovascular;  Laterality: N/A;   REPLACEMENT TOTAL KNEE Right    RIGHT/LEFT HEART CATH AND CORONARY/GRAFT ANGIOGRAPHY N/A 03/06/2024   Procedure: RIGHT/LEFT HEART CATH AND CORONARY/GRAFT ANGIOGRAPHY;  Surgeon: Anner Alm ORN, MD;  Location[facility] INVASIVE CV LAB;  Service: Cardiovascular;  Laterality: N/A;   SIMPLE MASTECTOMY WITH AXILLARY SENTINEL NODE BIOPSY Left 06/25/2022   Procedure: SIMPLE MASTECTOMY;  Surgeon: Mavis Anes, MD;  Location: [facility];  Service: General;  Laterality: Left;   TEE WITHOUT CARDIOVERSION N/A 11/16/2021   Procedure: TRANSESOPHAGEAL ECHOCARDIOGRAM (TEE);  Surgeon: Lucas Dorise POUR, MD;  Location: [facility] OR;  Service: Open Heart Surgery;  Laterality: N/A;   TUBAL LIGATION     WOUND EXPLORATION Left 07/26/2022   Procedure: EVACUATION OF HEMATOMA BREAST, DEBRIDEMENT OF LEFT BREAST WOUND;  Surgeon: Mavis Anes, MD;  Location: [facility];  Service:  General;  Laterality: Left;   family history includes Alcohol abuse in her father; COPD in her mother; Heart attack in her brother; Hypertension in her sister. Social History   Tobacco Use   Smoking status: Every Day    Current packs/day: 0.50    Average packs/day: 0.5 packs/day for 40.0 years (20.0 ttl pk-yrs)    Types: Cigarettes   Smokeless tobacco: Never  Vaping Use   Vaping status: Never Used  Substance Use Topics   Alcohol use: Never   Drug use: Never     Studies Reviewed: Katelyn   EKG Interpretation Date/Time:  Tuesday April 10 2024 89:71:75 EST Ventricular Rate:  80 PR Interval:  148 QRS Duration:  80 QT Interval:  412 QTC Calculation: 475 R Axis:   11  Text Interpretation: Normal sinus rhythm ST & T wave abnormality, consider inferior ischemia ST & T wave abnormality, consider anterolateral ischemia Prolonged QT When compared with ECG of 08-Mar-2024 15:56, Inverted T waves have replaced nonspecific T wave abnormality in Inferior leads Confirmed by Anner Lenis ([id number]) on 04/10/2024 10:31:03 AM    Lab Results  Component Value Date   CHOL 93 01/09/2024   HDL 37 (L) 01/09/2024   LDLCALC 38 01/09/2024   TRIG 92 01/09/2024   CHOLHDL 2.5 01/09/2024   Lab Results  Component Value Date   NA 136 03/09/2024   CL 104 03/09/2024   K 4.1 03/09/2024   CO2 23 03/09/2024   BUN 16 03/09/2024   CREATININE 1.57 (H) 03/09/2024   GFRNONAA 35 (L) 03/09/2024   CALCIUM  10.6 (H) 03/09/2024   PHOS 2.5 01/09/2024   ALBUMIN  4.1 03/02/2024   GLUCOSE 190 (H) 03/09/2024   Lab Results  Component Value Date   WBC 7.3 03/08/2024   HGB 11.7 (L) 03/08/2024   HCT 34.4 (L) 03/08/2024   MCV 94.2 03/08/2024   PLT 192 03/08/2024   -------------------  Myoview : LOW RISK.  No ischemia or infarction.  EF 61%.  (10/26/2023) Echocardiogram: Severely reduced EF of 25 to 30%.  Severely decreased function with likely Takotsubo pattern cardiomyopathy cannot exclude possible LAD ischemia.  Normal  RAP.  Entire apex is akinetic and the anterior wall/anteroseptum are hypokinetic.  (03/05/2024) Echocardiogram: EF 60-65%.  No RWMA.  G1 DD.  Normal RAP.  (03/29/2024) R&LHC: Severe cardiomyopathy with cardiac output/index of 3.93 and 1.98.  However well compensated with LVEDP of 8 mmHg and PCWP of 6 mmHg.  Severe distal LM-ostial LCx/ostial RI disease with widely patent LIMA-LAD and SVG-RI. Likely flush occlusion of SVG-OM/LPL -> leaving the large LCx-OM compromised by a 99% eccentric stenosis. Consider staged protected Left Main and LCx PCI once renal function is stabilized. Widely patent native RCA.  (03/06/2024)  Cardiac MRI: Findings consistent with Takotsubo cardiomyopathy.  Normal basal systolic function with mid to apical akinesis and elevated T2 values in mid to apical segments with no LGE.  Subendocardial LGE in the basal inferolateral wall consistent with small infarct.  Moderate systolic dysfunction with with EF of 38%.  Normal RV size and function.  EF 54%.  Lipomatous hypertrophy of the IAS.  (03/07/2024)   Risk Assessment/Calculations:           Physical Exam:   VS:  BP (!) 145/70 (BP Location: Left Arm, Patient Position: Sitting)   Pulse 80   Ht 5' 4 (1.626 m)   Wt 217 lb (98.4 kg)   SpO2 96%   BMI 37.25 kg/m    Wt Readings from Last 3 Encounters:  04/10/24 217 lb (98.4 kg)  03/09/24 215 lb 13.3 oz (97.9 kg)  02/29/24 196 lb 6.9 oz (89.1 kg)    GEN: Well nourished, well groomed in mild distress; nontoxic.  Moderately obese NECK: No JVD; No carotid bruits CARDIAC:  RRR,; Distant heart sounds but normal S1, S2; no murmurs, rubs, gallops RESPIRATORY:  Clear to auscultation without rales, wheezing or rhonchi ; nonlabored, good air movement. ABDOMEN: Soft, non-tender, non-distended EXTREMITIES:  No edema; No deformity; walks with a cane    ASSESSMENT AND PLAN: .    Problem List Items Addressed This Visit       Cardiology Problems   Anginal chest  pain at rest -  Primary   I am concerned that she is now having angina with minimal activity and even at rest.  She has severe distal left main into ostial LCx disease with a known occluded SVG to OM leaving a large territory of myocardium in jeopardy.  She seemed to do okay during her recovery from cardiomyopathy, but now that she is feeling better from that standpoint she is now more active and is now starting to note more angina symptoms with less exertion.  She is actually noticing discomfort here today.  Coronary artery disease with severe left main and circumflex stenosis, post-CABG, presenting with refractory angina pectoris Severe stenosis with refractory angina. Previous CABG with closed grafts. High calcium  complicates stenting. High-risk procedure due to tight angle and calcium . - Increased isosorbide  to full tablet daily => if admitted to the hospital, would consider nitroglycerin  infusion if she is having ongoing symptoms. - Plan to increase Toprol  XL to 50 mg daily to reduce heart rate and alleviate angina. - Scheduled atherectomy based PCI (had initially planned for this to be done on 04/13/2024, however with her ongoing symptoms, I feel it is more prudent to proceed sooner. Case discussed with Dr. Deatrice Cage here in clinic who came to meet the patient.  He has agreed to proceed with atherectomy-PCI on 04/11/2024. Case scheduled for roughly noon 04/11/2024. Recommend loading with Brilinta 300 mg p.o. tonight (04/10/2024) - Plan was to try to arrange for direct admission, however beds are unavailable, we will therefore have her report to the emergency room based on progressive/refractory angina at rest.  For admission: If she is actively having chest pain, would start IV nitroglycerin  and IV heparin .  Otherwise with consider nitroglycerin  paste and lieu of Imdur .  Post PCI she may not require Imdur . Loaded with Brilinta 300 mg  PM 04/10/2024 She will need precath hydration roughly 4 hours patient  on renal insufficiency.  This would mean starting at roughly 7 or 8 AM on 04/11/2024. 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 She expresses significant anxiety and preference for sedation during the procedure. - Will administer Twilight sedation during procedure to alleviate anxiety.      Chronic heart failure with preserved ejection fraction (HFpEF) (HCC) (Chronic)   Heart failure with previously reduced ejection fraction, now recovered to normal.  Ejection fraction recovery allows for more aggressive management of coronary artery disease.  Because of renal insufficiency, not currently on ARB/ACE inhibitor/MRA or ARNI.-Once her renal function stabilizes she is not can to be having any contrast procedures, would strongly consider converting from hydralazine  to ARNI.  For now,  Continue with hydralazine  but would increase to 50 mg 3 times daily post-hospitalization She is currently on Toprol  25 mg a day, anticipate either titrating up to 50 mg daily versus converting to carvedilol if additional blood pressure control required She is diabetic on insulin .  With EF back to normal, will hold off on SGLT2 inhibitor. She is on Lasix  which she is taking 40 mg as a standing dose.  Would continue at 40 mg standing dose but okay to double up if she has worsening edema, PND, orthopnea or weight gain more than 3 pounds.       Coronary artery disease involving native coronary artery of native heart with angina pectoris (Chronic)   Severe native vessel disease of the left main involving LAD, ramus and LCx.  Fortunately the graft to  the LCx and LAD are widely patent, however the SVG to the LCx is occluded and there is a severe ostial LCx disease in addition to the distal LM there is heavily calcified over 90% stenosed.  Plan was for staged atherectomy PCI which will now be moved up to 04/11/2024 with Dr. Deatrice Cage. With  progressive refractory angina symptoms, we recommended that she go to the ER for evaluation (see angina at rest section) She is scheduled for atherectomy PCI with Dr. Deatrice Cage on 04/11/2024 Will need precath hydration in the AM. BP mildly elevated today, would increase hydralazine  to 50 mg 3 times daily and Toprol  to 50 mg daily Once renal function stabilized and no further contrast requirement is anticipated, would consider switching from hydralazine  back to ARB/ARNI Lipid panel shows well-controlled lipids on 10 mg Zetia  and 20 mg of Crestor  He is currently on aspirin  and we will load with Brilinta prior to PCI (Brilinta chosen because of her ongoing insulin -dependent diabetes)      Hyperlipidemia associated with type 2 diabetes mellitus (HCC) (Chronic)   Type 2 diabetes with episodes of hypoglycemia. Insulin  doses reduced to 10 units for both Lantus  and three times daily lispro insulin . - Continue current insulin  regimen with reduced doses. Anticipate holding Lantus  pre-PCI as part of precath orders.  Lipid panel shows LDL 38 on current regimen Zetia  10 mg plus Crestor  20 mg. -Continue combination of Zetia  and Crestor .      Takotsubo cardiomyopathy   Takotsubo cardiomyopathy the setting of influenza.  Confirmed by echocardiogram.  Cardiomyopathy seems to have resolved with follow-up echocardiogram showing normalization of EF and wall motion abnormality..        Other   CAD---S/P CABG x 3 in June 2023 (Chronic)   CABG performed due to severe distal left main involving the ostium of the LAD, LCx and RI.  Most recent cath showed LIMA to LAD and SVG to RI widely patent with occluded SVG-OM.  Plan to complete revascularization by moving forward with atherectomy and PCI of the LM-ostial and proximal LCx.      CKD (chronic kidney disease), stage III (HCC) (Chronic)   Chronic kidney disease with fluctuating renal function Will have labs checked upon arrival to the ER. Will need at  least 4 hours precath hydration in the morning of 04/11/2024 as part of precath orders. Minimal contrast use.      Tobacco use disorder (Chronic)   Still smokes half pack a day.  I did not broach cessation with the patient today, but would recommend continued discussions on smoking cessation counseling.      Other Visit Diagnoses       Essential hypertension       Relevant Orders   EKG 12-Lead (Completed)           Informed Consent   Shared Decision Making/Informed Consent The risks [stroke (1 in 1000), death (1 in 1000), kidney failure [usually temporary] (1 in 500), bleeding (1 in 200), allergic reaction [possibly serious] (1 in 200)], benefits (diagnostic support and management of coronary artery disease) and alternatives of a cardiac catheterization were discussed in detail with Katelyn Kaufman and she is willing to proceed.      Follow-Up: No follow-ups on file.  I spent 71 minutes in the care of Katelyn MALVA Thermon today including reviewing labs (1 minute), reviewing studies (cardiac cath films reviewed, cardiac MRI and echocardiograms reviewed-total 11 minutes), face to face time discussing treatment options (35 minutes), reviewing records from recent  hospitalization, and notes from Dr. Debera, and Almarie Crate, NP (5 minutes), dictating, and coordinating ER visit/direct hospitalization, discussing procedure with Dr. Deatrice Cage, and documenting in the encounter.      Signed, Alm MICAEL Clay, MD, MS Alm Clay, M.D., M.S. Interventional Cardiologist  [facility] Pager # [phone]

## 2024-04-11 ENCOUNTER — Encounter (HOSPITAL_COMMUNITY): Admission: RE | Disposition: A | Payer: Self-pay | Source: Home / Self Care | Attending: Cardiology

## 2024-04-11 ENCOUNTER — Other Ambulatory Visit: Payer: Self-pay

## 2024-04-11 ENCOUNTER — Telehealth (HOSPITAL_COMMUNITY): Payer: Self-pay | Admitting: Pharmacy Technician

## 2024-04-11 ENCOUNTER — Other Ambulatory Visit (HOSPITAL_COMMUNITY): Payer: Self-pay

## 2024-04-11 ENCOUNTER — Ambulatory Visit (HOSPITAL_COMMUNITY): Admission: RE | Admit: 2024-04-11 | Source: Home / Self Care | Admitting: Cardiovascular Disease

## 2024-04-11 DIAGNOSIS — I10 Essential (primary) hypertension: Secondary | ICD-10-CM | POA: Diagnosis not present

## 2024-04-11 DIAGNOSIS — Z7982 Long term (current) use of aspirin: Secondary | ICD-10-CM | POA: Diagnosis not present

## 2024-04-11 DIAGNOSIS — R0789 Other chest pain: Secondary | ICD-10-CM | POA: Diagnosis present

## 2024-04-11 DIAGNOSIS — N1831 Chronic kidney disease, stage 3a: Secondary | ICD-10-CM | POA: Diagnosis present

## 2024-04-11 DIAGNOSIS — I2089 Other forms of angina pectoris: Secondary | ICD-10-CM | POA: Diagnosis not present

## 2024-04-11 DIAGNOSIS — E785 Hyperlipidemia, unspecified: Secondary | ICD-10-CM | POA: Diagnosis not present

## 2024-04-11 DIAGNOSIS — J449 Chronic obstructive pulmonary disease, unspecified: Secondary | ICD-10-CM | POA: Diagnosis present

## 2024-04-11 DIAGNOSIS — E1169 Type 2 diabetes mellitus with other specified complication: Secondary | ICD-10-CM | POA: Diagnosis present

## 2024-04-11 DIAGNOSIS — Z8249 Family history of ischemic heart disease and other diseases of the circulatory system: Secondary | ICD-10-CM | POA: Diagnosis not present

## 2024-04-11 DIAGNOSIS — I25110 Atherosclerotic heart disease of native coronary artery with unstable angina pectoris: Principal | ICD-10-CM

## 2024-04-11 DIAGNOSIS — E1122 Type 2 diabetes mellitus with diabetic chronic kidney disease: Secondary | ICD-10-CM | POA: Diagnosis present

## 2024-04-11 DIAGNOSIS — I252 Old myocardial infarction: Secondary | ICD-10-CM | POA: Diagnosis not present

## 2024-04-11 DIAGNOSIS — Z7902 Long term (current) use of antithrombotics/antiplatelets: Secondary | ICD-10-CM | POA: Diagnosis not present

## 2024-04-11 DIAGNOSIS — Z79811 Long term (current) use of aromatase inhibitors: Secondary | ICD-10-CM | POA: Diagnosis not present

## 2024-04-11 DIAGNOSIS — Z961 Presence of intraocular lens: Secondary | ICD-10-CM | POA: Diagnosis present

## 2024-04-11 DIAGNOSIS — I5181 Takotsubo syndrome: Secondary | ICD-10-CM | POA: Diagnosis present

## 2024-04-11 DIAGNOSIS — F419 Anxiety disorder, unspecified: Secondary | ICD-10-CM | POA: Diagnosis present

## 2024-04-11 DIAGNOSIS — Z91040 Latex allergy status: Secondary | ICD-10-CM | POA: Diagnosis not present

## 2024-04-11 DIAGNOSIS — F17210 Nicotine dependence, cigarettes, uncomplicated: Secondary | ICD-10-CM | POA: Diagnosis present

## 2024-04-11 DIAGNOSIS — Z79899 Other long term (current) drug therapy: Secondary | ICD-10-CM | POA: Diagnosis not present

## 2024-04-11 DIAGNOSIS — G4733 Obstructive sleep apnea (adult) (pediatric): Secondary | ICD-10-CM | POA: Diagnosis present

## 2024-04-11 DIAGNOSIS — E860 Dehydration: Secondary | ICD-10-CM | POA: Diagnosis present

## 2024-04-11 DIAGNOSIS — Z96651 Presence of right artificial knee joint: Secondary | ICD-10-CM | POA: Diagnosis present

## 2024-04-11 DIAGNOSIS — I5032 Chronic diastolic (congestive) heart failure: Secondary | ICD-10-CM

## 2024-04-11 DIAGNOSIS — I130 Hypertensive heart and chronic kidney disease with heart failure and stage 1 through stage 4 chronic kidney disease, or unspecified chronic kidney disease: Secondary | ICD-10-CM | POA: Diagnosis present

## 2024-04-11 DIAGNOSIS — E7849 Other hyperlipidemia: Secondary | ICD-10-CM | POA: Diagnosis present

## 2024-04-11 DIAGNOSIS — Z853 Personal history of malignant neoplasm of breast: Secondary | ICD-10-CM | POA: Diagnosis not present

## 2024-04-11 DIAGNOSIS — Z794 Long term (current) use of insulin: Secondary | ICD-10-CM | POA: Diagnosis not present

## 2024-04-11 HISTORY — PX: CORONARY ATHERECTOMY: CATH118238

## 2024-04-11 LAB — HEPARIN LEVEL (UNFRACTIONATED)
Heparin Unfractionated: 0.55 [IU]/mL (ref 0.30–0.70)
Heparin Unfractionated: 0.68 [IU]/mL (ref 0.30–0.70)

## 2024-04-11 LAB — BASIC METABOLIC PANEL WITH GFR
Anion gap: 10 (ref 5–15)
BUN: 15 mg/dL (ref 8–23)
CO2: 23 mmol/L (ref 22–32)
Calcium: 9.9 mg/dL (ref 8.9–10.3)
Chloride: 109 mmol/L (ref 98–111)
Creatinine, Ser: 1.22 mg/dL — ABNORMAL HIGH (ref 0.44–1.00)
GFR, Estimated: 47 mL/min — ABNORMAL LOW (ref >60)
Glucose, Bld: 106 mg/dL — ABNORMAL HIGH (ref 70–99)
Potassium: 4.1 mmol/L (ref 3.5–5.1)
Sodium: 142 mmol/L (ref 135–145)

## 2024-04-11 LAB — CBG MONITORING, ED
Glucose-Capillary: 120 mg/dL — ABNORMAL HIGH (ref 70–99)
Glucose-Capillary: 110 mg/dL — ABNORMAL HIGH (ref 70–99)
Glucose-Capillary: 113 mg/dL — ABNORMAL HIGH (ref 70–99)

## 2024-04-11 LAB — CBC
HCT: 36.3 % (ref 36.0–46.0)
Hemoglobin: 11.7 g/dL — ABNORMAL LOW (ref 12.0–15.0)
MCH: 31.6 pg (ref 26.0–34.0)
MCHC: 32.2 g/dL (ref 30.0–36.0)
MCV: 98.1 fL (ref 80.0–100.0)
Platelets: 229 K/uL (ref 150–400)
RBC: 3.7 MIL/uL — ABNORMAL LOW (ref 3.87–5.11)
RDW: 14.8 % (ref 11.5–15.5)
WBC: 6.4 K/uL (ref 4.0–10.5)
nRBC: 0 % (ref 0.0–0.2)

## 2024-04-11 LAB — MRSA NEXT GEN BY PCR, NASAL: MRSA by PCR Next Gen: NOT DETECTED

## 2024-04-11 LAB — POCT ACTIVATED CLOTTING TIME
Activated Clotting Time: 538 s
Activated Clotting Time: 464 s
Activated Clotting Time: 412 s

## 2024-04-11 LAB — GLUCOSE, CAPILLARY
Glucose-Capillary: 93 mg/dL (ref 70–99)
Glucose-Capillary: 185 mg/dL — ABNORMAL HIGH (ref 70–99)
Glucose-Capillary: 181 mg/dL — ABNORMAL HIGH (ref 70–99)

## 2024-04-11 MED ORDER — SODIUM CHLORIDE 0.9 % WEIGHT BASED INFUSION: 1.0000 mL/kg/h | INTRAVENOUS | Status: DC

## 2024-04-11 MED ORDER — ATROPINE SULFATE 1 MG/10ML IJ SOSY
PREFILLED_SYRINGE | INTRAMUSCULAR | Status: AC
  Filled 2024-04-11: qty 10

## 2024-04-11 MED ORDER — PHENYLEPHRINE 80 MCG/ML (10ML) SYRINGE FOR IV PUSH (FOR BLOOD PRESSURE SUPPORT)
PREFILLED_SYRINGE | INTRAVENOUS | Status: DC | PRN
Start: 2024-04-11 — End: 2024-04-11
  Administered 2024-04-11: 240 ug via INTRAVENOUS

## 2024-04-11 MED ORDER — HYDRALAZINE HCL 20 MG/ML IJ SOLN
10.0000 mg | INTRAMUSCULAR | Status: DC | PRN
Start: 2024-04-11 — End: 2024-04-12

## 2024-04-11 MED ORDER — IOHEXOL 350 MG/ML SOLN
INTRAVENOUS | Status: DC | PRN
Start: 2024-04-11 — End: 2024-04-11
  Administered 2024-04-11: 182 mL

## 2024-04-11 MED ORDER — SODIUM CHLORIDE 0.9% FLUSH: 3.0000 mL | INTRAVENOUS | Status: DC | PRN

## 2024-04-11 MED ORDER — HEPARIN SODIUM (PORCINE) 1000 UNIT/ML IJ SOLN
INTRAMUSCULAR | Status: AC
  Filled 2024-04-11: qty 10

## 2024-04-11 MED ORDER — SODIUM CHLORIDE 0.9 % WEIGHT BASED INFUSION: 3.0000 mL/kg/h | INTRAVENOUS | Status: DC

## 2024-04-11 MED ORDER — SODIUM CHLORIDE 0.9 % IV SOLN
INTRAVENOUS | Status: DC | PRN
  Administered 2024-04-11: 10 mL/h via INTRAVENOUS

## 2024-04-11 MED ORDER — INSULIN ASPART 100 UNIT/ML IJ SOLN: 0.0000 [IU] | Freq: Three times a day (TID) | INTRAMUSCULAR | Status: DC

## 2024-04-11 MED ORDER — FENTANYL CITRATE (PF) 100 MCG/2ML IJ SOLN
INTRAMUSCULAR | Status: AC
  Filled 2024-04-11: qty 2

## 2024-04-11 MED ORDER — SODIUM CHLORIDE 0.9% FLUSH
3.0000 mL | Freq: Two times a day (BID) | INTRAVENOUS | Status: DC
  Administered 2024-04-11 – 2024-04-12 (×2): 3 mL via INTRAVENOUS

## 2024-04-11 MED ORDER — NITROGLYCERIN 1 MG/10 ML FOR IR/CATH LAB
INTRA_ARTERIAL | Status: DC | PRN
Start: 2024-04-11 — End: 2024-04-11
  Administered 2024-04-11: 200 ug via INTRACORONARY

## 2024-04-11 MED ORDER — ASPIRIN 81 MG PO CHEW
81.0000 mg | CHEWABLE_TABLET | ORAL | Status: AC
  Administered 2024-04-11: 81 mg via ORAL
  Filled 2024-04-11: qty 1

## 2024-04-11 MED ORDER — CHLORHEXIDINE GLUCONATE CLOTH 2 % EX PADS
6.0000 | MEDICATED_PAD | Freq: Every day | CUTANEOUS | Status: DC
  Administered 2024-04-11 – 2024-04-12 (×2): 6 via TOPICAL

## 2024-04-11 MED ORDER — FENTANYL CITRATE (PF) 100 MCG/2ML IJ SOLN
INTRAMUSCULAR | Status: DC | PRN
  Administered 2024-04-11: 25 ug via INTRAVENOUS
  Administered 2024-04-11: 50 ug via INTRAVENOUS
  Administered 2024-04-11: 25 ug via INTRAVENOUS

## 2024-04-11 MED ORDER — SODIUM CHLORIDE 0.9 % IV SOLN: INTRAVENOUS | Status: AC

## 2024-04-11 MED ORDER — ATROPINE SULFATE 1 MG/10ML IJ SOSY
PREFILLED_SYRINGE | INTRAMUSCULAR | Status: DC | PRN
Start: 2024-04-11 — End: 2024-04-11
  Administered 2024-04-11: .5 mg via INTRAVENOUS

## 2024-04-11 MED ORDER — VIPERSLIDE LUBRICANT OPTIME: TOPICAL | Status: DC | PRN

## 2024-04-11 MED ORDER — ORAL CARE MOUTH RINSE: 15.0000 mL | OROMUCOSAL | Status: DC | PRN

## 2024-04-11 MED ORDER — NOREPINEPHRINE 4 MG/250ML-% IV SOLN
INTRAVENOUS | Status: AC
  Filled 2024-04-11: qty 250

## 2024-04-11 MED ORDER — MIDAZOLAM HCL 2 MG/2ML IJ SOLN
INTRAMUSCULAR | Status: AC
  Filled 2024-04-11: qty 2

## 2024-04-11 MED ORDER — LIDOCAINE HCL (PF) 1 % IJ SOLN
INTRAMUSCULAR | Status: DC | PRN
Start: 2024-04-11 — End: 2024-04-11
  Administered 2024-04-11: 2 mL via INTRADERMAL

## 2024-04-11 MED ORDER — NITROGLYCERIN 1 MG/10 ML FOR IR/CATH LAB
INTRA_ARTERIAL | Status: AC
  Filled 2024-04-11: qty 10

## 2024-04-11 MED ORDER — PHENYLEPHRINE 80 MCG/ML (10ML) SYRINGE FOR IV PUSH (FOR BLOOD PRESSURE SUPPORT)
PREFILLED_SYRINGE | INTRAVENOUS | Status: AC
  Filled 2024-04-11: qty 10

## 2024-04-11 MED ORDER — SODIUM CHLORIDE 0.9 % IV SOLN: 250.0000 mL | INTRAVENOUS | Status: DC | PRN

## 2024-04-11 MED ORDER — ENOXAPARIN SODIUM 40 MG/0.4ML IJ SOSY
40.0000 mg | PREFILLED_SYRINGE | INTRAMUSCULAR | Status: DC
  Administered 2024-04-12: 40 mg via SUBCUTANEOUS
  Filled 2024-04-11: qty 0.4

## 2024-04-11 MED ORDER — MIDAZOLAM HCL (PF) 2 MG/2ML IJ SOLN
INTRAMUSCULAR | Status: DC | PRN
Start: 2024-04-11 — End: 2024-04-11
  Administered 2024-04-11 (×2): 1 mg via INTRAVENOUS

## 2024-04-11 MED ORDER — NOREPINEPHRINE BITARTRATE 1 MG/ML IV SOLN
INTRAVENOUS | Status: DC | PRN
  Administered 2024-04-11: 10 ug/kg/min via INTRAVENOUS

## 2024-04-11 MED ORDER — OXYCODONE HCL 5 MG PO TABS
5.0000 mg | ORAL_TABLET | Freq: Once | ORAL | Status: AC
  Administered 2024-04-11: 5 mg via ORAL
  Filled 2024-04-11: qty 1

## 2024-04-11 MED ORDER — HEPARIN (PORCINE) IN NACL 1000-0.9 UT/500ML-% IV SOLN
INTRAVENOUS | Status: DC | PRN
Start: 2024-04-11 — End: 2024-04-11
  Administered 2024-04-11: 1000 mL
  Administered 2024-04-11: 500 mL

## 2024-04-11 MED ORDER — VERAPAMIL HCL 2.5 MG/ML IV SOLN
INTRAVENOUS | Status: AC
  Filled 2024-04-11: qty 2

## 2024-04-11 MED ORDER — HEPARIN (PORCINE) IN NACL 2-0.9 UNITS/ML
INTRAMUSCULAR | Status: DC | PRN
  Administered 2024-04-11: 10 mL via INTRA_ARTERIAL

## 2024-04-11 MED ORDER — INSULIN ASPART 100 UNIT/ML IJ SOLN: 0.0000 [IU] | Freq: Every day | INTRAMUSCULAR | Status: DC

## 2024-04-11 MED ORDER — HEPARIN SODIUM (PORCINE) 1000 UNIT/ML IJ SOLN
INTRAMUSCULAR | Status: DC | PRN
  Administered 2024-04-11: 2000 [IU] via INTRAVENOUS
  Administered 2024-04-11: 10000 [IU] via INTRAVENOUS

## 2024-04-11 MED ORDER — LABETALOL HCL 5 MG/ML IV SOLN
10.0000 mg | INTRAVENOUS | Status: AC | PRN
  Administered 2024-04-11 (×3): 10 mg via INTRAVENOUS
  Filled 2024-04-11 (×2): qty 4

## 2024-04-11 MED ORDER — LIDOCAINE HCL (PF) 1 % IJ SOLN
INTRAMUSCULAR | Status: AC
  Filled 2024-04-11: qty 30

## 2024-04-11 NOTE — Progress Notes (Addendum)
       As below, patient seen and examined.  Patient continues to have chest discomfort that increases with cough.  She denies dyspnea this morning.  She was admitted from the office yesterday and is scheduled for PCI later this morning.  Continue aspirin , Brilinta, statin and Zetia .  She appears to be euvolemic on examination.  Lasix  on hold for upcoming procedure.  Resume afterwards.  Redell Shallow, MD    Progress Note  Patient Name: Katelyn Kaufman Date of Encounter: 04/11/2024 [facility] Cardiologist: Jayson Sierras, MD   Interval Summary   Reports feeling mildly uncomfortable  Recently switched out NTG paste, hopefully that will provide some relief  Awaiting scheduled atherectomy today   Vital Signs Vitals:   04/11/24 0510 04/11/24 0511 04/11/24 0700 04/11/24 0800  BP: (!) 144/75     Pulse: 67  60 63  Resp: 14  15 20   Temp:  98.1 F (36.7 C)    TempSrc:  Oral    SpO2: 100%  100% 100%    Intake/Output Summary (Last 24 hours) at 04/11/2024 0820 Last data filed at 04/11/2024 0244 Gross per 24 hour  Intake 142.96 ml  Output --  Net 142.96 ml      04/10/2024   10:20 AM 03/09/2024    1:21 AM 03/08/2024   11:00 PM  Last 3 Weights  Weight (lbs) 217 lb 215 lb 13.3 oz 215 lb 13.3 oz  Weight (kg) 98.431 kg 97.9 kg 97.9 kg     Telemetry/ECG  Sinus rhythm, HR 60s - Personally Reviewed  Physical Exam  GEN: No acute distress, presently on supplemental oxygen.   Neck: No JVD Cardiac: RRR, distant heart sounds.  Respiratory: Clear to auscultation bilaterally. GI: Soft, nontender, non-distended  MS: No edema  Assessment & Plan   Chest pain CAD s/p CABG x 3 LIMA to LAD, SVG to RI, SVG to OM  R/LHC from 02/2024: patent LIMA-LAD and SVG-RI, occluded SVG-OM/LPL Previously discussed staged protected Left Main and LCx PCI once renal function is stabilized  Creatinine 1.22 this morning Scheduled for coronary arterectomy today, further recs to  follow Continue ASA 81 mg daily Continue Zetia  10 mg daily Continue Crestor  20 mg daily Loaded with Billinta 180 mg and started on 90 mg BID   Chronic HFpEF GDMT limited due to renal function Home meds: hydralazine  25 mg TID, Toprol  25 mg daily, PO Lasix  40 mg daily Continue on hydralazine  and Toprol  Restart Lasix  post-cath  Hypertension BP 159/76 on presentation  Continue Toprol  25 mg daily, may need to increase at discharge Continue hydralazine  25 mg TID, may need to increase at discharge  Holding home Lasix  prior to procedure   Hyperlipidemia  01/09/2024: HDL 37; LDL Cholesterol 38 03/02/2024: ALT 22  Continue Zetia  10 mg daily Continue Crestor  20 mg daily  CKD Renal function improved this visit Continue to monitor   Type 2 diabetes  Takes Lantus  10 units QAM  Reduced to 5 units with plans for cath Continue SSI while inpatient   COPD Continue home meds    Arthritis  Continue home meds    Medical Readiness Date: 04/12/2024    For questions or updates, please contact [facility] Please consult [email or web address] for contact info under   Signed, Waddell DELENA Donath, PA-C

## 2024-04-11 NOTE — Telephone Encounter (Signed)
 Patient Product/process Development Scientist completed.    The patient is insured through [facility]. Patient has Medicare and is not eligible for a copay card, but may be able to apply for patient assistance or Medicare RX Payment Plan (Patient Must reach out to their plan, if eligible for payment plan), if available.    Ran test claim for Brilinta 90 mg and the current 30 day co-pay is $47.00.  Ran test claim for Yupelri 175 mcg/3 ml Neb and the current 30 day co-pay is $316.27  This test claim was processed through [employer]- copay amounts may vary at other pharmacies due to [employer], or as the patient moves through the different stages of their insurance plan.     Reyes Sharps, CPHT Pharmacy Technician Patient Advocate Specialist Lead [facility] Patient Advocate Team Direct Number: [phone]  Fax: [phone]

## 2024-04-11 NOTE — Progress Notes (Signed)
 PHARMACY - ANTICOAGULATION CONSULT NOTE  Pharmacy Consult for heparin  Indication: chest pain/ACS  Allergies  Allergen Reactions   Latex Rash and Dermatitis   Nsaids Other (See Comments)    Severe constipation   Metformin  And Related Diarrhea   Patient Measurements:   Vital Signs: Temp: 97.7 F (36.5 C) (11/19 0839) Temp Source: Oral (11/19 0839) BP: 173/101 (11/19 1100) Pulse Rate: 58 (11/19 1100)  Labs: Recent Labs    04/10/24 1517 04/10/24 1647 04/11/24 0320 04/11/24 0409 04/11/24 1243  HGB 12.3  --  11.7*  --   --   HCT 38.7  --  36.3  --   --   PLT 235  --  229  --   --   HEPARINUNFRC  --   --   --  0.55 0.68  CREATININE 1.40*  --  1.22*  --   --   TROPONINIHS 32* 32*  --   --   --     Estimated Creatinine Clearance: 46.8 mL/min (A) (by C-G formula based on SCr of 1.22 mg/dL (H)).   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   COPD (chronic obstructive pulmonary disease) (HCC)    Depression    Essential hypertension    Hyperlipidemia    Myocardial infarction (HCC)    [place] to the LAD   OSA (obstructive sleep apnea)    CPAP qHS   PONV (postoperative nausea and vomiting)    Type 2 diabetes mellitus (HCC)     Medications:  (Not in a hospital admission)  Infusions:   sodium chloride      sodium chloride      heparin  1,300 Units/hr (04/11/24 1213)   Assessment: Patient is a 73 year old female sent to the ED from PCP due to progressively worsening chest pain now present at rest. Plans for cath today. Patient is not on thinners. Pharmacy is consulted for heparin .   CBC WNL. No concern for bleeding.   11/19 AM update:  Confirmatory level therapeutic at 0.68,  but on the higher end of range.   Goal of Therapy:  Heparin  level 0.3-0.7 units/ml Monitor platelets by anticoagulation protocol: Yes   Plan:  Decrease heparin  slightly from 1300 units/hr to 1250 units/hr  Daily heparin  levels and CBC if no cath today  Vermell Mccallum, PharmD

## 2024-04-11 NOTE — H&P (View-Only) (Signed)
       As below, patient seen and examined.  Patient continues to have chest discomfort that increases with cough.  She denies dyspnea this morning.  She was admitted from the office yesterday and is scheduled for PCI later this morning.  Continue aspirin , Brilinta, statin and Zetia .  She appears to be euvolemic on examination.  Lasix  on hold for upcoming procedure.  Resume afterwards.  Redell Shallow, MD    Progress Note  Patient Name: Katelyn Kaufman Date of Encounter: 04/11/2024 [facility] Cardiologist: Jayson Sierras, MD   Interval Summary   Reports feeling mildly uncomfortable  Recently switched out NTG paste, hopefully that will provide some relief  Awaiting scheduled atherectomy today   Vital Signs Vitals:   04/11/24 0510 04/11/24 0511 04/11/24 0700 04/11/24 0800  BP: (!) 144/75     Pulse: 67  60 63  Resp: 14  15 20   Temp:  98.1 F (36.7 C)    TempSrc:  Oral    SpO2: 100%  100% 100%    Intake/Output Summary (Last 24 hours) at 04/11/2024 0820 Last data filed at 04/11/2024 0244 Gross per 24 hour  Intake 142.96 ml  Output --  Net 142.96 ml      04/10/2024   10:20 AM 03/09/2024    1:21 AM 03/08/2024   11:00 PM  Last 3 Weights  Weight (lbs) 217 lb 215 lb 13.3 oz 215 lb 13.3 oz  Weight (kg) 98.431 kg 97.9 kg 97.9 kg     Telemetry/ECG  Sinus rhythm, HR 60s - Personally Reviewed  Physical Exam  GEN: No acute distress, presently on supplemental oxygen.   Neck: No JVD Cardiac: RRR, distant heart sounds.  Respiratory: Clear to auscultation bilaterally. GI: Soft, nontender, non-distended  MS: No edema  Assessment & Plan   Chest pain CAD s/p CABG x 3 LIMA to LAD, SVG to RI, SVG to OM  R/LHC from 02/2024: patent LIMA-LAD and SVG-RI, occluded SVG-OM/LPL Previously discussed staged protected Left Main and LCx PCI once renal function is stabilized  Creatinine 1.22 this morning Scheduled for coronary arterectomy today, further recs to  follow Continue ASA 81 mg daily Continue Zetia  10 mg daily Continue Crestor  20 mg daily Loaded with Billinta 180 mg and started on 90 mg BID   Chronic HFpEF GDMT limited due to renal function Home meds: hydralazine  25 mg TID, Toprol  25 mg daily, PO Lasix  40 mg daily Continue on hydralazine  and Toprol  Restart Lasix  post-cath  Hypertension BP 159/76 on presentation  Continue Toprol  25 mg daily, may need to increase at discharge Continue hydralazine  25 mg TID, may need to increase at discharge  Holding home Lasix  prior to procedure   Hyperlipidemia  01/09/2024: HDL 37; LDL Cholesterol 38 03/02/2024: ALT 22  Continue Zetia  10 mg daily Continue Crestor  20 mg daily  CKD Renal function improved this visit Continue to monitor   Type 2 diabetes  Takes Lantus  10 units QAM  Reduced to 5 units with plans for cath Continue SSI while inpatient   COPD Continue home meds    Arthritis  Continue home meds    Medical Readiness Date: 04/12/2024    For questions or updates, please contact [facility] Please consult [email or web address] for contact info under   Signed, Waddell DELENA Donath, PA-C

## 2024-04-11 NOTE — ED Notes (Addendum)
 Heparin  Drip paused for 20 minutes and patient lab drawn from above the IV site due to the patient being Left Arm restrict and only being able to use the right limb. Kim at the Lab was notified.

## 2024-04-11 NOTE — Interval H&P Note (Signed)
 History and Physical Interval Note:  04/11/2024 1:45 PM  Katelyn Kaufman  has presented today for surgery, with the diagnosis of angina.  The various methods of treatment have been discussed with the patient and family. After consideration of risks, benefits and other options for treatment, the patient has consented to  Procedure(s): CORONARY ATHERECTOMY (N/A) as a surgical intervention.  The patient's history has been reviewed, patient examined, no change in status, stable for surgery.  I have reviewed the patient's chart and labs.  Questions were answered to the patient's satisfaction.     Chaunte Hornbeck

## 2024-04-11 NOTE — Progress Notes (Signed)
 PHARMACY - ANTICOAGULATION CONSULT NOTE  Pharmacy Consult for heparin  Indication: chest pain/ACS  Allergies  Allergen Reactions   Latex Rash and Dermatitis   Nsaids Other (See Comments)    Severe constipation   Metformin  And Related Diarrhea    Patient Measurements:    Vital Signs: Temp: 97.9 F (36.6 C) (11/18 2324) Temp Source: Oral (11/18 2324) BP: 143/81 (11/19 0238) Pulse Rate: 67 (11/19 0238)  Labs: Recent Labs    04/10/24 1517 04/10/24 1647 04/11/24 0320 04/11/24 0409  HGB 12.3  --  11.7*  --   HCT 38.7  --  36.3  --   PLT 235  --  229  --   HEPARINUNFRC  --   --   --  0.55  CREATININE 1.40*  --   --   --   TROPONINIHS 32* 32*  --   --     Estimated Creatinine Clearance: 40.8 mL/min (A) (by C-G formula based on SCr of 1.4 mg/dL (H)).   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   COPD (chronic obstructive pulmonary disease) (HCC)    Depression    Essential hypertension    Hyperlipidemia    Myocardial infarction (HCC)    [place] to the LAD   OSA (obstructive sleep apnea)    CPAP qHS   PONV (postoperative nausea and vomiting)    Type 2 diabetes mellitus (HCC)     Medications:  (Not in a hospital admission)  Infusions:   sodium chloride      sodium chloride      Followed by   sodium chloride      heparin  1,300 Units/hr (04/11/24 0244)    Assessment: Patient is a 73 year old female sent to the ED from PCP due to progressively worsening chest pain now present at rest. Plans for cath tomorrow. Patient is not on thinners. Pharmacy is consulted for heparin .   CBC WNL. No concern for bleeding.   11/19 AM update:  Heparin  level therapeutic   Goal of  Therapy:  Heparin  level 0.3-0.7 units/ml Monitor platelets by anticoagulation protocol: Yes   Plan:  Cont heparin  1300 units/hr Heparin  level in 8 hours   Lynwood Mckusick, PharmD, BCPS Clinical Pharmacist Phone: [phone]

## 2024-04-12 ENCOUNTER — Other Ambulatory Visit (HOSPITAL_COMMUNITY): Payer: Self-pay

## 2024-04-12 ENCOUNTER — Encounter (HOSPITAL_COMMUNITY): Payer: Self-pay | Admitting: Cardiovascular Disease

## 2024-04-12 ENCOUNTER — Other Ambulatory Visit: Payer: Self-pay | Admitting: Cardiology

## 2024-04-12 DIAGNOSIS — I5032 Chronic diastolic (congestive) heart failure: Secondary | ICD-10-CM

## 2024-04-12 DIAGNOSIS — Z955 Presence of coronary angioplasty implant and graft: Secondary | ICD-10-CM

## 2024-04-12 LAB — CBC
HCT: 34.1 % — ABNORMAL LOW (ref 36.0–46.0)
Hemoglobin: 11.2 g/dL — ABNORMAL LOW (ref 12.0–15.0)
MCH: 32.1 pg (ref 26.0–34.0)
MCHC: 32.8 g/dL (ref 30.0–36.0)
MCV: 97.7 fL (ref 80.0–100.0)
Platelets: 193 K/uL (ref 150–400)
RBC: 3.49 MIL/uL — ABNORMAL LOW (ref 3.87–5.11)
RDW: 14.8 % (ref 11.5–15.5)
WBC: 5.6 K/uL (ref 4.0–10.5)
nRBC: 0 % (ref 0.0–0.2)

## 2024-04-12 LAB — BASIC METABOLIC PANEL WITH GFR
Anion gap: 9 (ref 5–15)
BUN: 18 mg/dL (ref 8–23)
CO2: 23 mmol/L (ref 22–32)
Calcium: 9.7 mg/dL (ref 8.9–10.3)
Chloride: 106 mmol/L (ref 98–111)
Creatinine, Ser: 1.31 mg/dL — ABNORMAL HIGH (ref 0.44–1.00)
GFR, Estimated: 43 mL/min — ABNORMAL LOW (ref >60)
Glucose, Bld: 157 mg/dL — ABNORMAL HIGH (ref 70–99)
Potassium: 4.2 mmol/L (ref 3.5–5.1)
Sodium: 138 mmol/L (ref 135–145)

## 2024-04-12 LAB — GLUCOSE, CAPILLARY
Glucose-Capillary: 130 mg/dL — ABNORMAL HIGH (ref 70–99)
Glucose-Capillary: 149 mg/dL — ABNORMAL HIGH (ref 70–99)

## 2024-04-12 MED ORDER — TICAGRELOR 90 MG PO TABS
90.0000 mg | ORAL_TABLET | Freq: Two times a day (BID) | ORAL | 3 refills | Status: AC
  Filled 2024-04-12: qty 60, 30d supply, fill #0

## 2024-04-12 MED ORDER — NITROGLYCERIN 0.4 MG SL SUBL
0.4000 mg | SUBLINGUAL_TABLET | SUBLINGUAL | 3 refills | Status: AC | PRN
  Filled 2024-04-12: qty 25, 5d supply, fill #0

## 2024-04-12 MED ORDER — METOPROLOL SUCCINATE ER 50 MG PO TB24
50.0000 mg | ORAL_TABLET | Freq: Every day | ORAL | 3 refills | Status: DC
  Filled 2024-04-12: qty 90, 90d supply, fill #0

## 2024-04-12 MED ORDER — ASPIRIN 81 MG PO TBEC
81.0000 mg | DELAYED_RELEASE_TABLET | Freq: Every day | ORAL | 3 refills | Status: AC
  Filled 2024-04-12: qty 90, 90d supply, fill #0

## 2024-04-12 NOTE — Plan of Care (Signed)
  Problem: Skin Integrity: Goal: Risk for impaired skin integrity will decrease Outcome: Progressing   Problem: Tissue Perfusion: Goal: Adequacy of tissue perfusion will improve Outcome: Progressing   Problem: Cardiac: Goal: Ability to achieve and maintain adequate cardiovascular perfusion will improve Outcome: Progressing   Problem: Cardiovascular: Goal: Vascular access site(s) Level 0-1 will be maintained Outcome: Progressing

## 2024-04-12 NOTE — Progress Notes (Signed)
 Rounding Note   Patient Name: Katelyn Kaufman Date of Encounter: 04/12/2024  [facility] Cardiologist: Jayson Sierras, MD   Subjective  No CP or dyspnea  Scheduled Meds:  anastrozole   1 mg Oral Daily   aspirin  EC  81 mg Oral Daily   buPROPion  150 mg Oral q morning   Chlorhexidine  Gluconate Cloth  6 each Topical Daily   doxepin  10 mg Oral QHS   DULoxetine   60 mg Oral Daily   enoxaparin  (LOVENOX ) injection  40 mg Subcutaneous Q24H   ezetimibe   10 mg Oral Daily   gabapentin   300 mg Oral TID   hydrALAZINE   25 mg Oral Q8H   insulin  aspart  0-5 Units Subcutaneous QHS   insulin  aspart  0-9 Units Subcutaneous TID WC   insulin  glargine-yfgn  5 Units Subcutaneous Daily   loratadine  10 mg Oral Daily   metoprolol  succinate  50 mg Oral Daily   montelukast  10 mg Oral QHS   multivitamin with minerals  1 tablet Oral Daily   revefenacin  175 mcg Nebulization Daily   rosuvastatin   20 mg Oral Daily   sodium chloride  flush  3 mL Intravenous Q12H   sodium chloride  flush  3 mL Intravenous Q12H   ticagrelor  90 mg Oral BID   Continuous Infusions:  sodium chloride      PRN Meds: sodium chloride , acetaminophen , hydrALAZINE , nitroGLYCERIN , ondansetron  (ZOFRAN ) IV, mouth rinse, senna-docusate, sodium chloride  flush, sodium chloride  flush   Vital Signs  Vitals:   04/12/24 0405 04/12/24 0430 04/12/24 0500 04/12/24 0510  BP:  (!) 143/82  (!) 142/79  Pulse: 61 62    Resp: 14 15    Temp: 98 F (36.7 C)     TempSrc: Oral     SpO2: 96% 96%    Weight:   99.7 kg     Intake/Output Summary (Last 24 hours) at 04/12/2024 0713 Last data filed at 04/12/2024 0500 Gross per 24 hour  Intake 1738.36 ml  Output 750 ml  Net 988.36 ml      04/12/2024    5:00 AM 04/10/2024   10:20 AM 03/09/2024    1:21 AM  Last 3 Weights  Weight (lbs) 219 lb 12.8 oz 217 lb 215 lb 13.3 oz  Weight (kg) 99.701 kg 98.431 kg 97.9 kg      Telemetry Sinus this AM - Personally Reviewed  ECG  Sinus  rhythm with improving T wave inversion in the anterior lateral and inferior leads- Personally Reviewed  Physical Exam  GEN: No acute distress.   Neck: No JVD Cardiac: RRR Respiratory: Clear to auscultation bilaterally. GI: Soft, nontender, non-distended  MS: No edema; radial cath site with no hematoma. Neuro:  Nonfocal  Psych: Normal affect   Labs High Sensitivity Troponin:   Recent Labs  Lab 04/10/24 1517 04/10/24 1647  TROPONINIHS 32* 32*     Chemistry Recent Labs  Lab 04/10/24 1517 04/11/24 0320 04/12/24 0047  NA 141 142 138  K 3.8 4.1 4.2  CL 106 109 106  CO2 27 23 23   GLUCOSE 198* 106* 157*  BUN 14 15 18   CREATININE 1.40* 1.22* 1.31*  CALCIUM  10.0 9.9 9.7  GFRNONAA 40* 47* 43*  ANIONGAP 8 10 9      Hematology Recent Labs  Lab 04/10/24 1517 04/11/24 0320 04/12/24 0047  WBC 5.8 6.4 5.6  RBC 3.88 3.70* 3.49*  HGB 12.3 11.7* 11.2*  HCT 38.7 36.3 34.1*  MCV 99.7 98.1 97.7  MCH 31.7  31.6 32.1  MCHC 31.8 32.2 32.8  RDW 14.9 14.8 14.8  PLT 235 229 193     Radiology  CARDIAC CATHETERIZATION Result Date: 04/11/2024   Mid LAD lesion is 5% stenosed.   Dist LAD lesion is 10% stenosed.   Ost Cx to Prox Cx lesion is 99% stenosed.   Origin lesion is 100% stenosed.   Mid LM to Dist LM lesion is 95% stenosed.   A stent was successfully placed.   A drug-eluting stent was successfully placed using a STENT SYNERGY XD [id number] in the main branch and side branch.   in the main branch and side branch.   in the main branch and side branch.   in the main branch and side branch.   Angioplasty was performed in the main branch and side branch. .   Angioplasty was performed in the main branch and side branch. .   Angioplasty was performed in the main branch and side branch. .   Angioplasty was performed in the main branch and side branch. .   Post intervention, there is a 0% residual stenosis.   Post intervention, there is a 0% residual stenosis.   LIMA and is normal in caliber.    SVG and is normal in caliber.   SVG due to known occlusion.   The graft exhibits no disease.   The graft exhibits no disease.   Recommend uninterrupted dual antiplatelet therapy with Aspirin  81mg  daily and Ticagrelor 90mg  twice daily for a minimum of 12 months (ACS-Class I recommendation). Successful high risk and very challenging orbital atherectomy and drug-eluting stent placement to the left main into the proximal left circumflex.  Extremely difficult to get into the subtotal occlusion of the left circumflex due to a 90 degree origin with severe distal left main disease.  I had to do balloon angioplasty of the distal left main into the LAD and then I was able to cross with a 90 degree super cross catheter over a Fielder XT wire. The procedure was complicated by left main and left circumflex dissection after atherectomy extending into the proximal LAD.  This was associated with chest pain, hypotension and bradycardia.  She required pressor support briefly until her stent was placed.  The dissection was treated successfully with drug-eluting stent placement from the proximal left main into the mid left circumflex.  Given that the LAD is supplied by the LIMA, I elected not to intervene on the native LAD. Recommendations: Will monitor in the ICU given very complex procedure and residual dissection in the proximal LAD.  The patient was hemodynamically stable and almost chest pain-free post stent placement   DG Chest 2 View Result Date: 04/10/2024 CLINICAL DATA:  Chest pain. EXAM: CHEST - 2 VIEW COMPARISON:  Chest radiograph dated 03/08/2024. FINDINGS: No focal consolidation, pleural effusion or pneumothorax. Stable cardiac silhouette. Median sternotomy wires and CABG vascular clips. No acute osseous pathology. IMPRESSION: No active cardiopulmonary disease. Electronically Signed   By: Vanetta Chou M.D.   On: 04/10/2024 15:27     Patient Profile   73 y.o. female with past medical history of coronary artery  disease status post coronary artery bypass graft, COPD, hypertension, hyperlipidemia, obstructive sleep apnea, diabetes mellitus admitted with chest pain.  Echocardiogram November 2025 showed normal LV function, grade 1 diastolic dysfunction.  Cardiac catheterization yesterday showed 95% left main and 99% proximal circumflex.  Patient had orbital atherectomy and drug-eluting stent to the left main into the proximal circumflex.  Procedure was complicated by  left main and left circumflex dissection extending into the proximal LAD.  Dissection was treated with drug-eluting stent placement from the proximal left main into the circumflex.  Assessment & Plan   1 coronary artery disease-patient is status post PCI of the left main into the circumflex.  Procedure complicated by left main and left circumflex dissection extending into the LAD.  This was treated with drug-eluting stent.  She is pain-free this morning.  Continue aspirin , Brilinta , Crestor .  2 hypertension-patient's blood pressure is mildly elevated this morning.  Continue present medications and adjust as an outpatient based on follow-up readings.  3 hyperlipidemia-continue Crestor  and Zetia .  4 chronic stage IIIa kidney disease-creatinine essentially unchanged this morning.  5 history of chronic heart failure preserved ejection fraction-will continue diuretic at discharge at preadmission dose.  Plan discharge today.  Check potassium and renal function in 1 week.  Follow-up with APP 2 weeks.  Follow-up Dr. Anner 3 months. Greater than 30 minutes physician time.   For questions or updates, please contact [facility] Please consult [email or web address] for contact info under       Signed, Redell Shallow, MD  04/12/2024, 7:13 AM

## 2024-04-12 NOTE — Progress Notes (Signed)
 CARDIAC REHAB PHASE I    Pt resting in bed, feeling well today. Pt reports tolerating mobility with no CP, mild dizziness and SOB. Pt states mild dizziness and SOB are her baseline. Pt reports limited mobility at baseline, will defer walk in hallway.  Post stent education including site care, restrictions, risk factors, exercise guidelines, NTG use, antiplatelet therapy importance, heart healthy diabetic diet, smoking cessation and CRP2 reviewed. All questions and concerns addressed. Will refer to AP for CRP2. Plan for home later today.    [id number] Vaughn Asberry Hacking, RN BSN 04/12/2024 10:47 AM

## 2024-04-12 NOTE — Progress Notes (Signed)
 Ordering BMET to be completed in 1 week post-discharge

## 2024-04-12 NOTE — Discharge Summary (Signed)
 Discharge Summary   Patient ID: Katelyn Kaufman MRN: [id number]; DOB: 08/26/1950  Admit date: 04/10/2024 Discharge date: 04/12/2024  PCP:  [facility]   [facility] Providers Cardiologist:  Jayson Sierras, MD    Discharge Diagnoses  Principal Problem:   Anginal chest pain at rest Active Problems:   Type 2 diabetes mellitus with complication, with long-term current use of insulin  (HCC)   Hypertension   Hyperlipidemia associated with type 2 diabetes mellitus (HCC)   CAD---S/P CABG x 3 in June 2023   CKD (chronic kidney disease), stage III (HCC)   Chronic heart failure with preserved ejection fraction (HFpEF) (HCC)   S/P drug eluting coronary stent placement  Diagnostic Studies/Procedures   Coronary atherectomy, 04/11/2024   Mid LAD lesion is 5% stenosed.   Dist LAD lesion is 10% stenosed.   Ost Cx to Prox Cx lesion is 99% stenosed.   Origin lesion is 100% stenosed.   Mid LM to Dist LM lesion is 95% stenosed.   A stent was successfully placed.   A drug-eluting stent was successfully placed using a STENT SYNERGY XD 3.50X38 in the main branch and side branch.    Angioplasty was performed in the main branch and side branch.    Post intervention, there is a 0% residual stenosis.   LIMA and is normal in caliber.   SVG and is normal in caliber.   SVG due to known occlusion.   The graft exhibits no disease.   Recommend uninterrupted dual antiplatelet therapy with Aspirin  81mg  daily and Ticagrelor 90mg  twice daily for a minimum of 12 months (ACS-Class I recommendation).   Successful high risk and very challenging orbital atherectomy and drug-eluting stent placement to the left main into the proximal left circumflex.  Extremely difficult to get into the subtotal occlusion of the left circumflex due to a 90 degree origin with severe distal left main disease.  I had to do balloon angioplasty of the distal left main into the LAD and then I was able to cross with a 90  degree super cross catheter over a Fielder XT wire. The procedure was complicated by left main and left circumflex dissection after atherectomy extending into the proximal LAD.  This was associated with chest pain, hypotension and bradycardia.  She required pressor support briefly until her stent was placed.  The dissection was treated successfully with drug-eluting stent placement from the proximal left main into the mid left circumflex.  Given that the LAD is supplied by the LIMA, I elected not to intervene on the native LAD.   Recommendations: Will monitor in the ICU given very complex procedure and residual dissection in the proximal LAD.  The patient was hemodynamically stable and almost chest pain-free post stent placement _____________   History of Present Illness   Katelyn Kaufman is a 73 y.o. female with CAD-LAD PCI followed by CABG for left main disease (LIMA-LAD, SVG-RI, SVG-LPL with occluded SVG-LPL and severe ostial LCx disease), recovered Takotsubo cardiomyopathy with now chronic HFpEF, HTN, HLD, and and CKD.  She was seen by Dr. Anner in outpatient setting 04/10/2024, that was sent over to the emergency department to undergo scheduled arthrectomy the day after.  Hospital Course    Full report as above, she underwent successful orbital arthrectomy with DES placement to left main into proximal left circumflex.  She was admitted to the ICU overnight as it was a complex procedure with residual dissection noted in the proximal LAD, for observation.  She was  seen by Dr. Pietro this morning and was noted to be doing well, denied any chest pain or shortness of breath.  She was seen medically stable for discharge.  Her treatment plan at discharge is as follows below:  Chest pain CAD s/p CABG x 3 with recent DES  Continue DAPT with ASA 81 mg daily + Brilinta  90 mg BID x 12 months Continue Crestor  20 mg daily   Chronic HFpEF GDMT limited due to renal function Continue Toprol  50 mg  daily Continue hydralazine  25 mg TID, may need to increase at follow up appointment  Continue with home PO Lasix  40 mg PRN BMET in 1 week   Hypertension Continue Toprol  50 mg daily Continue hydralazine  25 mg TID, may need to increase at follow up appointment    Hyperlipidemia  Continue Crestor  20 mg daily   CKD stage 3a Continue to monitor    Type 2 diabetes  Restart home insulin  at usual dose    COPD Continue home meds     Arthritis  Continue home meds      Did the patient have an acute coronary syndrome (MI, NSTEMI, STEMI, etc) this admission?:  No                               Did the patient have a percutaneous coronary intervention (stent / angioplasty)?:  Yes.    Cath/PCI Registry Performance & Quality Measures: Aspirin  prescribed? - Yes ADP Receptor Inhibitor (Plavix /Clopidogrel , Brilinta /Ticagrelor  or Effient/Prasugrel) prescribed (includes medically managed patients)? - Yes High Intensity Statin (Lipitor 40-80mg  or Crestor  20-40mg ) prescribed? - Yes For EF <40%, was ACEI/ARB prescribed? - Not Applicable (EF >/= 40%) For EF <40%, Aldosterone Antagonist (Spironolactone or Eplerenone) prescribed? - Not Applicable (EF >/= 40%) Cardiac Rehab Phase II ordered? - Yes     _____________  Discharge Vitals Blood pressure (!) 140/79, pulse 64, temperature 98.2 F (36.8 C), temperature source Oral, resp. rate 16, weight 99.7 kg, SpO2 98%.  Filed Weights   04/12/24 0500  Weight: 99.7 kg    Labs & Radiologic Studies  CBC Recent Labs    04/11/24 0320 04/12/24 0047  WBC 6.4 5.6  HGB 11.7* 11.2*  HCT 36.3 34.1*  MCV 98.1 97.7  PLT 229 193   Basic Metabolic Panel Recent Labs    88/80/74 0320 04/12/24 0047  NA 142 138  K 4.1 4.2  CL 109 106  CO2 23 23  GLUCOSE 106* 157*  BUN 15 18  CREATININE 1.22* 1.31*  CALCIUM  9.9 9.7   Liver Function Tests No results for input(s): AST, ALT, ALKPHOS, BILITOT, PROT, ALBUMIN  in the last 72 hours. No results  for input(s): LIPASE, AMYLASE in the last 72 hours. High Sensitivity Troponin:   Recent Labs  Lab 04/10/24 1517 04/10/24 1647  TROPONINIHS 32* 32*    No results for input(s): TRNPT in the last 720 hours.  BNP Invalid input(s): POCBNP No results for input(s): PROBNP in the last 72 hours.  No results for input(s): BNP in the last 72 hours.  D-Dimer No results for input(s): DDIMER in the last 72 hours. Hemoglobin A1C No results for input(s): HGBA1C in the last 72 hours. Fasting Lipid Panel No results for input(s): CHOL, HDL, LDLCALC, TRIG, CHOLHDL, LDLDIRECT in the last 72 hours. Lipoprotein (a)  Date/Time Value Ref Range Status  11/17/2021 04:55 AM 36.0 (H) <75.0 nmol/L Final    Comment:    (NOTE) Note:  Values  greater than or equal to 75.0 nmol/L may       indicate an independent risk factor for CHD,       but must be evaluated with caution when applied       to non-Caucasian populations due to the       influence of genetic factors on Lp(a) across       ethnicities. Performed At: [facility] [address] [place], [place] [id number] Jennette Shorter MD Ey:[phone]     Thyroid  Function Tests No results for input(s): TSH, T4TOTAL, T3FREE, THYROIDAB in the last 72 hours.  Invalid input(s): FREET3 _____________  CARDIAC CATHETERIZATION Result Date: 04/11/2024   Mid LAD lesion is 5% stenosed.   Dist LAD lesion is 10% stenosed.   Ost Cx to Prox Cx lesion is 99% stenosed.   Origin lesion is 100% stenosed.   Mid LM to Dist LM lesion is 95% stenosed.   A stent was successfully placed.   A drug-eluting stent was successfully placed using a STENT SYNERGY XD 3.50X38 in the main branch and side branch.   in the main branch and side branch.   in the main branch and side branch.   in the main branch and side branch.   Angioplasty was performed in the main branch and side branch. .   Angioplasty was performed in the main branch and side  branch. .   Angioplasty was performed in the main branch and side branch. .   Angioplasty was performed in the main branch and side branch. .   Post intervention, there is a 0% residual stenosis.   Post intervention, there is a 0% residual stenosis.   LIMA and is normal in caliber.   SVG and is normal in caliber.   SVG due to known occlusion.   The graft exhibits no disease.   The graft exhibits no disease.   Recommend uninterrupted dual antiplatelet therapy with Aspirin  81mg  daily and Ticagrelor 90mg  twice daily for a minimum of 12 months (ACS-Class I recommendation). Successful high risk and very challenging orbital atherectomy and drug-eluting stent placement to the left main into the proximal left circumflex.  Extremely difficult to get into the subtotal occlusion of the left circumflex due to a 90 degree origin with severe distal left main disease.  I had to do balloon angioplasty of the distal left main into the LAD and then I was able to cross with a 90 degree super cross catheter over a Fielder XT wire. The procedure was complicated by left main and left circumflex dissection after atherectomy extending into the proximal LAD.  This was associated with chest pain, hypotension and bradycardia.  She required pressor support briefly until her stent was placed.  The dissection was treated successfully with drug-eluting stent placement from the proximal left main into the mid left circumflex.  Given that the LAD is supplied by the LIMA, I elected not to intervene on the native LAD. Recommendations: Will monitor in the ICU given very complex procedure and residual dissection in the proximal LAD.  The patient was hemodynamically stable and almost chest pain-free post stent placement   DG Chest 2 View Result Date: 04/10/2024 CLINICAL DATA:  Chest pain. EXAM: CHEST - 2 VIEW COMPARISON:  Chest radiograph dated 03/08/2024. FINDINGS: No focal consolidation, pleural effusion or pneumothorax. Stable cardiac silhouette.  Median sternotomy wires and CABG vascular clips. No acute osseous pathology. IMPRESSION: No active cardiopulmonary disease. Electronically Signed   By: Vanetta Shelia HERO.D.  On: 04/10/2024 15:27   ECHOCARDIOGRAM COMPLETE Result Date: 03/29/2024    ECHOCARDIOGRAM REPORT   [name] [facility] Date of Exam: 03/29/2024 Medical Rec #:  [id number]        Height:       64.0 in Accession #:    [id number]       Weight:       215.8 lb Date of Birth:  May 04, 1951         BSA:          2.021 m Patient Age:    73 years         BP:           120/77 mmHg Patient Gender: F                HR:           97 bpm. Exam Location:  [facility] Procedure: 2D Echo, Cardiac Doppler and Color Doppler (Both Spectral and Color            Flow Doppler were utilized during procedure). 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                 Cardiomyopathy and CHF, CAD and Previous Myocardial Infarction,                 Prior CABG; Risk Factors:Family History of Coronary Artery                 Disease, Hypertension, Diabetes, Dyslipidemia and Current                 Smoker.  Sonographer:    Bascom Burows RCS, RVS Referring Phys: [id number] SAMUEL G MCDOWELL  Sonographer Comments: Global longitudinal strain was attempted. IMPRESSIONS  1. Left ventricular ejection fraction, by estimation, is 60 to 65%. Left ventricular ejection fraction by 3D volume is 56 %. The left ventricle has normal function. The left ventricle has no regional wall motion abnormalities. Left ventricular diastolic  parameters are consistent with Grade I diastolic dysfunction (impaired relaxation).  2. Right ventricular systolic function is normal. The right ventricular size is normal.  3. The mitral valve is normal in structure. Trivial mitral valve regurgitation. No evidence of mitral stenosis.  4. The aortic valve is tricuspid. Aortic valve regurgitation is not visualized. No aortic  stenosis is present.  5. The inferior vena cava is normal in size with greater than 50% respiratory variability, suggesting right atrial pressure of 3 mmHg. Comparison(s): A prior study was performed on 03/05/2024. Changes from prior study are noted. EF improved from 25-30% to normal now. FINDINGS  Left Ventricle: Left ventricular ejection fraction, by estimation, is 60 to 65%. Left ventricular ejection fraction by 3D volume is 56 %. The left ventricle has normal function. The left ventricle has no regional wall motion abnormalities. Strain was performed and the global longitudinal strain is indeterminate. The left ventricular internal cavity size was normal in size. There is no left ventricular hypertrophy. Left ventricular diastolic parameters are consistent with Grade I diastolic dysfunction  (impaired relaxation). Normal left ventricular filling pressure. Right Ventricle: The right ventricular size is normal. No increase in right ventricular wall thickness. Right ventricular systolic function is normal. Left Atrium: Left atrial size was normal in size. Right Atrium: Right atrial size was normal in size. Pericardium: There is no evidence of pericardial effusion. Mitral  Valve: The mitral valve is normal in structure. Trivial mitral valve regurgitation. No evidence of mitral valve stenosis. MV peak gradient, 4.7 mmHg. The mean mitral valve gradient is 2.0 mmHg. Tricuspid Valve: The tricuspid valve is normal in structure. Tricuspid valve regurgitation is trivial. No evidence of tricuspid stenosis. Aortic Valve: The aortic valve is tricuspid. Aortic valve regurgitation is not visualized. No aortic stenosis is present. Aortic valve peak gradient measures 8.9 mmHg. Pulmonic Valve: The pulmonic valve was not well visualized. Pulmonic valve regurgitation is trivial. No evidence of pulmonic stenosis. Aorta: The aortic root is normal in size and structure. Venous: The inferior vena cava is normal in size with greater than  50% respiratory variability, suggesting right atrial pressure of 3 mmHg. IAS/Shunts: No atrial level shunt detected by color flow Doppler. Additional Comments: 3D was performed not requiring image post processing on an independent workstation and was normal.  LEFT VENTRICLE PLAX 2D LVIDd:         4.80 cm         Diastology LVIDs:         3.10 cm         LV e' medial:    6.96 cm/s LV PW:         1.00 cm         LV E/e' medial:  12.0 LV IVS:        1.10 cm         LV e' lateral:   9.68 cm/s LVOT diam:     2.00 cm         LV E/e' lateral: 8.6 LVOT Area:     3.14 cm                                 3D Volume EF LV Volumes (MOD)               LV 3D EF:    Left LV vol d, MOD    69.6 ml                    ventricul A2C:                                        ar LV vol d, MOD    65.9 ml                    ejection A4C:                                        fraction LV vol s, MOD    29.5 ml                    by 3D A2C:                                        volume is LV vol s, MOD    29.3 ml                    56 %. A4C: LV SV MOD A2C:   40.1 ml LV SV MOD A4C:  65.9 ml       3D Volume EF: LV SV MOD BP:    40.4 ml       3D EF:        56 %                                LV EDV:       131 ml                                LV ESV:       58 ml                                LV SV:        73 ml RIGHT VENTRICLE             IVC RV Basal diam:  3.20 cm     IVC diam: 1.70 cm RV Mid diam:    3.50 cm RV S prime:     11.00 cm/s  PULMONARY VEINS TAPSE (M-mode): 2.0 cm      Diastolic Velocity: 51.00 cm/s                             S/D Velocity:       1.30                             Systolic Velocity:  66.40 cm/s LEFT ATRIUM             Index        RIGHT ATRIUM           Index LA diam:        3.40 cm 1.68 cm/m   RA Area:     11.60 cm LA Vol (A2C):   41.3 ml 20.44 ml/m  RA Volume:   24.30 ml  12.03 ml/m LA Vol (A4C):   24.4 ml 12.07 ml/m LA Biplane Vol: 32.2 ml 15.93 ml/m  AORTIC VALVE                 PULMONIC VALVE AV Area  (Vmax): 2.55 cm     PV Vmax:          0.93 m/s AV Vmax:        149.00 cm/s  PV Peak grad:     3.5 mmHg AV Peak Grad:   8.9 mmHg     PR End Diast Vel: 6.76 msec LVOT Vmax:      121.00 cm/s  AORTA Ao Root diam: 3.20 cm MITRAL VALVE                TRICUSPID VALVE MV Area (PHT): 3.70 cm     TR Peak grad:   46.5 mmHg MV Peak grad:  4.7 mmHg     TR Vmax:        341.00 cm/s MV Mean grad:  2.0 mmHg MV Vmax:       1.08 m/s     SHUNTS MV Vmean:      65.2 cm/s    Systemic Diam: 2.00 cm MV Decel Time: 205 msec MV E velocity: 83.60 cm/s MV A velocity: 110.00 cm/s MV E/A ratio:  0.76 Vishnu Priya Mallipeddi  Electronically signed by Diannah Late Mallipeddi Signature Date/Time: 03/29/2024/4:31:21 PM    Final     Disposition Pt is being discharged home today in good condition per MD.  Follow-up Plans & Appointments  Future Appointments  Date Time Provider Department Center  05/08/2024 10:05 AM Miriam Norris, NP [facility]  07/16/2024  2:20 PM Debera Jayson MATSU, MD [facility]  09/05/2024 12:30 PM [facility]  09/05/2024  1:15 PM [facility] None  09/12/2024  1:30 PM Lamon Pleasant HERO, PA-C [facility] None   Discharge Instructions     Amb Referral to Cardiac Rehabilitation   Complete by: As directed    Diagnosis: Coronary Stents   After initial evaluation and assessments completed: Virtual Based Care may be provided alone or in conjunction with Phase 2 Cardiac Rehab based on patient barriers.: Yes   Intensive Cardiac Rehabilitation (ICR) [facility] location only OR Traditional Cardiac Rehabilitation (TCR) *If criteria for ICR are not met will enroll in TCR [facility] only): Yes   Call MD for:  redness, tenderness, or signs of infection (pain, swelling, redness, odor or green/yellow discharge around incision site)   Complete by: As directed    Discharge instructions   Complete by: As directed    PLEASE DO NOT MISS ANY DOSES OF YOUR BRILINTA!!!!! Also keep a log of you  blood pressures and bring back to your follow up appt. Please call the office with any questions.   Patients taking blood thinners should generally stay away from medicines like ibuprofen, Advil, Motrin, naproxen, and Aleve due to risk of stomach bleeding. You may takeTylenol as directed or talk to your primary doctor about alternatives.  PLEASE ENSURE THAT YOU DO NOT RUN OUT OF YOUR BRILINTA. This medication is very important to remain on for at least one year. IF you have issues obtaining this medication due to cost please CALL the office 3-5 business days prior to running out in order to prevent missing doses of this medication.   Radial Site Care Refer to this sheet in the next few weeks. These instructions provide you with information on caring for yourself after your procedure. Your caregiver may also give you more specific instructions. Your treatment has been planned according to current medical practices, but problems sometimes occur. Call your caregiver if you have any problems or questions after your procedure.  HOME CARE INSTRUCTIONS You may shower the day after the procedure. Remove the bandage (dressing) and gently wash the site with plain soap and water . Gently pat the site dry.  Do not apply powder or lotion to the site.  Do not submerge the affected site in water  for 3 to 5 days.  Inspect the site at least twice daily.  Do not flex or bend the affected arm for 24 hours.  No lifting over 5 pounds (2.3 kg) for 5 days after your procedure.  Do not drive home if you are discharged the same day of the procedure. Have someone else drive you.  You may drive 24 hours after the procedure unless otherwise instructed by your caregiver.   What to expect: Any bruising will usually fade within 1 to 2 weeks.  Blood that collects in the tissue (hematoma) may be painful to the touch. It should usually decrease in size and tenderness within 1 to 2 weeks.   SEEK IMMEDIATE MEDICAL CARE IF: You  have unusual pain at the radial site.  You have redness, warmth, swelling, or pain at the radial site.  You have drainage (other than a small amount of blood on the dressing).  You have chills.  You have a fever or persistent symptoms for more than 72 hours.  You have a fever and your symptoms suddenly get worse.  Your arm becomes pale, cool, tingly, or numb.  You have heavy bleeding from the site. Hold pressure on the site.      Discharge Medications Allergies as of 04/12/2024       Reactions   Latex Rash, Dermatitis   Nsaids Other (See Comments)   Severe constipation   Metformin  And Related Diarrhea        Medication List     STOP taking these medications    isosorbide  mononitrate 30 MG 24 hr tablet Commonly known as: IMDUR        TAKE these medications    acetaminophen  500 MG tablet Commonly known as: TYLENOL  Take 1,000 mg by mouth in the morning and at bedtime.   albuterol  108 (90 Base) MCG/ACT inhaler Commonly known as: VENTOLIN  HFA Inhale 1 puff into the lungs every 6 (six) hours as needed for wheezing or shortness of breath.   anastrozole  1 MG tablet Commonly known as: ARIMIDEX  Take 1 tablet (1 mg total) by mouth daily.   aspirin  EC 81 MG tablet Take 1 tablet (81 mg total) by mouth daily. Swallow whole. Start taking on: April 13, 2024   buPROPion 150 MG 24 hr tablet Commonly known as: WELLBUTRIN XL Take 150 mg by mouth every morning.   doxepin 10 MG capsule Commonly known as: SINEQUAN Take 10 mg by mouth at bedtime.   DULoxetine  60 MG capsule Commonly known as: CYMBALTA  Take 60 mg by mouth daily.   fexofenadine 180 MG tablet Commonly known as: ALLEGRA Take 180 mg by mouth daily.   furosemide  40 MG tablet Commonly known as: LASIX  Take 1 tablet (40 mg total) by mouth daily as needed (for weight gains more than 3 pounds in 1 day or 5 pounds in 1 week.).   gabapentin  300 MG capsule Commonly known as: NEURONTIN  Take 300-600 mg by mouth See  admin instructions. Take two capsules (600mg ) by mouth in the morning, and one capsule (300mg ) at bedtime.   HumaLOG KwikPen 100 UNIT/ML KwikPen Generic drug: insulin  lispro Inject 10 Units into the skin 3 (three) times daily.   hydrALAZINE  25 MG tablet Commonly known as: APRESOLINE  Take 1 tablet (25 mg total) by mouth every 8 (eight) hours.   Lantus  SoloStar 100 UNIT/ML Solostar Pen Generic drug: insulin  glargine Inject 45 Units into the skin daily. What changed: how much to take   metoprolol  succinate 50 MG 24 hr tablet Commonly known as: TOPROL -XL Take 1 tablet (50 mg total) by mouth daily. Take with or immediately following a meal. What changed:  medication strength how much to take additional instructions   montelukast 10 MG tablet Commonly known as: SINGULAIR Take 10 mg by mouth at bedtime.   nitroGLYCERIN  0.4 MG SL tablet Commonly known as: NITROSTAT  Place 1 tablet (0.4 mg total) under the tongue every 5 (five) minutes x 3 doses as needed for chest pain. What changed: reasons to take this   ondansetron  4 MG disintegrating tablet Commonly known as: ZOFRAN -ODT Take 1 tablet (4 mg total) by mouth every 8 (eight) hours as needed for vomiting or nausea. For 7 days supply   rosuvastatin  20 MG tablet Commonly known as: CRESTOR  Take 1 tablet (20 mg total) by mouth daily.   ticagrelor 90 MG Tabs tablet  Commonly known as: BRILINTA Take 1 tablet (90 mg total) by mouth 2 (two) times daily.        Outstanding Labs/Studies BMET in 1 week  Duration of Discharge Encounter: APP Time: 25 minutes   Signed, Waddell DELENA Donath, PA-C 04/12/2024, 11:11 AM

## 2024-04-12 NOTE — Progress Notes (Signed)
 Discharge instructions (including medications) discussed with and copy provided to patient/caregiver

## 2024-05-08 ENCOUNTER — Ambulatory Visit: Attending: Nurse Practitioner | Admitting: Nurse Practitioner

## 2024-05-08 ENCOUNTER — Encounter: Payer: Self-pay | Admitting: Nurse Practitioner

## 2024-05-08 ENCOUNTER — Other Ambulatory Visit: Payer: Self-pay | Admitting: Cardiology

## 2024-05-08 VITALS — BP 148/82 | HR 77 | Ht 64.0 in | Wt 214.4 lb

## 2024-05-08 DIAGNOSIS — I5032 Chronic diastolic (congestive) heart failure: Secondary | ICD-10-CM

## 2024-05-08 DIAGNOSIS — Z79899 Other long term (current) drug therapy: Secondary | ICD-10-CM

## 2024-05-08 DIAGNOSIS — I10 Essential (primary) hypertension: Secondary | ICD-10-CM

## 2024-05-08 MED ORDER — METOPROLOL SUCCINATE ER 50 MG PO TB24: 75.0000 mg | ORAL_TABLET | Freq: Every day | ORAL | 6 refills | Status: AC

## 2024-05-08 NOTE — Patient Instructions (Addendum)
 Medication Instructions:   Increase Toprol  XL to 75mg  daily  Continue all other medications.     Labwork:  CBC, CMET - orders given today Office will contact with results via phone, letter or mychart.     Testing/Procedures:  none  Follow-Up:  4-6 weeks   Any Other Special Instructions Will Be Listed Below (If Applicable).   If you need a refill on your cardiac medications before your next appointment, please call your pharmacy.

## 2024-05-08 NOTE — Progress Notes (Unsigned)
 Cardiology Office Note:  .   Date:  11/10/2023 ID:  Katelyn Kaufman, DOB 24-Feb-1951, MRN [id number] PCP: [facility] Providers Cardiologist:  Debera Jayson MATSU, MD History of Present Illness: .   Katelyn Kaufman is a 73 y.o. female with a PMH of CAD, s/p CABG in June 2023, HTN, mixed HLD, OSA, T2DM, hx of breast cancer, s/p left mastectomy, tobacco abuse, who presents today for hospital follow-up.   Last seen by Dr. Debera on Oct 04, 2023.  She was doing well from a cardiac perspective.    Recently hospitalized early June 2025 for chest pain, trops were mildly elevated. Cardiology was consulted. Echo showed normal LVEF, underwent NST that was low risk. Did have some AKI on CKD stage 3b, ARB was d/c. Recommended to recheck labs in OP setting.   She presents today for hospital follow-up. Denies any recurrence in chest pain since leaving the hospital. Denies any chest pain, shortness of breath, palpitations, syncope, presyncope, dizziness, orthopnea, PND, swelling or significant weight changes, acute bleeding, or claudication. Compliant with her medications.   CMET, CBC.     _____   Has been hospitalized twice since last office visit.  Hospitalized in October 2025 for dehydration and AKI, find to have acute viral illness that was complicated by Takotsubo cardiomyopathy with severely reduced EF on echo.  Patient was referred for right and left heart cath that revealed severely elevated pressures and severe distal left main-left circumflex stenosis along with occluded RI and LAD.  See full report below.  Plan was to consider staged PCI of distal left main into left circumflex when she was recovered from CHF standpoint.  She was started on GDMT that was limited by her renal insufficiency.  Follow-up echo showed recovered EF to 60 to 65%, was referred for interventional cardiology consultation to discuss PCI options.  Last seen by dr. Anner on 04/10/2024.  At the  time, patient noted progressive refractory angina symptoms, was sent to ER for evaluation and was hospitalized.  She underwent high risk and very challenging orbital arthrectomy and DES placement to left main into proximal left circumflex-see full report below.  Procedure was complicated by left main and left circumflex dissection after arthrectomy extending into the proximal LAD.  She had associated bradycardia, chest pain, and hypotension.  Briefly required pressors dissection was treated successfully with DES placement from proximal left main into the mid left circumflex.  Was monitored in the ICU given very complex procedure.  Did well postop.  Today she is here for follow-up and post PCI follow-up.  She states      ROS: Negative. See HPI.    Studies Reviewed: SABRA    EKG: EKG is not ordered today.   Lexiscan  10/2023:   The study is normal. There are no perfusion defects consistent with prior infarct or current ischemia. The study is low risk.   No ST deviation was noted.   LV perfusion is normal.   Left ventricular function is normal. Nuclear stress EF: 61%. The left ventricular ejection fraction is normal (55-65%). End diastolic cavity size is normal.   There is a moderate size moderate intensity inferior defect that is most intense in the resting images with normal wall motion. Finding is consistent with artifact due to adjacent gut radiotracer uptake.  Echo 10/2023:   1. Left ventricular ejection fraction, by estimation, is 60 to 65%. The  left ventricle has normal function. The left ventricle has no regional  wall motion abnormalities. There is mild left ventricular hypertrophy.  Left ventricular diastolic parameters  are consistent with Grade I diastolic dysfunction (impaired relaxation).   2. Right ventricular systolic function is normal. The right ventricular  size is normal.   3. The mitral valve is normal in structure. No evidence of mitral valve  regurgitation. No evidence of  mitral stenosis.   4. The tricuspid valve is abnormal.   5. The aortic valve is tricuspid. Aortic valve regurgitation is not  visualized. No aortic stenosis is present.   6. The inferior vena cava is normal in size with greater than 50%  respiratory variability, suggesting right atrial pressure of 3 mmHg.  TEE 10/2021:  Complications: No known complications during this procedure.  POST-OP IMPRESSIONS  _ Left Ventricle: has hyperdynamic systolic function, with an ejection  fraction  of 70%. The cavity size was normal. The wall motion is normal.  _ Right Ventricle: normal function. The cavity was normal. The wall motion  is  normal.  _ Aorta: there is no dissection present in the aorta.  _ Aortic Valve: The aortic valve appears unchanged from pre-bypass.  _ Mitral Valve: There is mild regurgitation.  _ Tricuspid Valve: There is mild regurgitation.   LHC 10/2021:    Mid LM to Prox LAD lesion is 95% stenosed with 99% stenosed side branch in Ost Cx.  Ramus lesion is 95% stenosed. - Trifucation lesion   Mid LAD stent is 5% stenosed.  Dist LAD stent is 10% stenosed.   Prox RCA lesion is 45% stenosed.   The left ventricular systolic function is normal.  The left ventricular ejection fraction is 50-55% by visual estimate.   There is no aortic valve stenosis.   ----------------------- Severe distal LM-trifurcation LAD, RI, LCx 95-99 % eccentric stenosis with minimal downstream disease. LAD has mid and distal vessel stent with diffuse calcification.  Stents are widely patent.  LAD reaches the apex.  1 very proximal ramus like small diagonal branch followed by 2 additional diagonal branches.  Too small for bypass. Ramus or medius is a large-caliber vessel that reaches the apex with distal branches. LCx courses as of the lateral OM/LPL distally after giving rise to small to moderate-sized OM1, OM 2 and OM 4 with minuscule OM 3.  Too small for grafting. RCA has proximal eccentric 45 to 50% stenosis  with minimal distal disease giving rise to RPDA and 2 PL branches.  No significant disease in the RCA system. Preserved LVEF with mild mid to apical anterior hypokinesis. Hemodynamics:  Central AoP: 176/89 with a MAP 124 mmHg LVP/EDP: 172/5-15 mmHg   Recommendations: Urgent/emergent CVTS consultation with Dr. Lucas performed in the Cath Lab.  Plan is for emergent CABG Continue IABP via RFA Radial sheath sutured in place to be used for arterial line during procedure.  Removal in the CVICU post CABG with TR band placed by catheter supervisor.  Echo 07/2021:   1. Left ventricular ejection fraction, by estimation, is 60 to 65%. The  left ventricle has normal function. The left ventricle has no regional  wall motion abnormalities. There is mild left ventricular hypertrophy.  Left ventricular diastolic parameters  are consistent with Grade I diastolic dysfunction (impaired relaxation).   2. Right ventricular systolic function is normal. The right ventricular  size is normal. Tricuspid regurgitation signal is inadequate for assessing  PA pressure.   3. The mitral valve is grossly normal. Trivial mitral valve  regurgitation.   4. The aortic valve is tricuspid.  Aortic valve regurgitation is not  visualized.   5. The inferior vena cava is normal in size with greater than 50%  respiratory variability, suggesting right atrial pressure of 3 mmHg.   Comparison(s): No significant change from prior study.  Physical Exam:   VS:  Ht 5' 4 (1.626 m)   Wt 214 lb 6.4 oz (97.3 kg)   BMI 36.80 kg/m    Wt Readings from Last 3 Encounters:  05/08/24 214 lb 6.4 oz (97.3 kg)  04/12/24 219 lb 12.8 oz (99.7 kg)  04/10/24 217 lb (98.4 kg)    GEN: Obese, 73 y.o. female in no acute distress, but appears to be in pain NECK: No JVD; No carotid bruits CARDIAC: S1/S2, RRR, no murmurs, rubs, gallops.  RESPIRATORY:  Clear to auscultation without rales, wheezing or rhonchi  ABDOMEN: Soft, non-tender,  non-distended EXTREMITIES:  No edema; No deformity   ASSESSMENT AND PLAN: .    CAD, s/p CABG Stable with no anginal symptoms. No indication for ischemic evaluation. Continue current medication regimen. Heart healthy diet and regular cardiovascular exercise encouraged.   HTN BP stable. Discussed to monitor BP at home at least 2 hours after medications and sitting for 5-10 minutes. No medication changes at this time. Heart healthy diet and regular cardiovascular exercise encouraged. Continue to follow with PCP. Will recheck BMET.   Mixed HLD LDL 74 in 2023. Goal LDL < 55. Being managed by PCP. Will recheck FLP in August 2025 as statin was increased at previous office visit. Heart healthy diet and regular cardiovascular exercise encouraged.   4. AKI on CKD stage 3b, anemia, medication management Most recent labs showed improvement in kidney function prior to hospital d/c. Will recheck CBC and BMET as advised by hospitalist. Continue to follow with PCP.   Dispo: Follow-up with Dr. Debera or APP in 6 months or sooner if anything changes.   Signed, Katelyn Crate, NP

## 2024-05-09 ENCOUNTER — Other Ambulatory Visit (HOSPITAL_COMMUNITY)
Admission: RE | Admit: 2024-05-09 | Discharge: 2024-05-09 | Disposition: A | Discharge status: Home / Self Care | Source: Ambulatory Visit | Attending: Nephrology | Admitting: Nephrology

## 2024-05-09 DIAGNOSIS — E119 Type 2 diabetes mellitus without complications: Secondary | ICD-10-CM | POA: Insufficient documentation

## 2024-05-09 LAB — CBC
HCT: 42.9 % (ref 36.0–46.0)
Hemoglobin: 14.1 g/dL (ref 12.0–15.0)
MCH: 31.6 pg (ref 26.0–34.0)
MCHC: 32.9 g/dL (ref 30.0–36.0)
MCV: 96.2 fL (ref 80.0–100.0)
Platelets: 197 K/uL (ref 150–400)
RBC: 4.46 MIL/uL (ref 3.87–5.11)
RDW: 14.5 % (ref 11.5–15.5)
WBC: 5.5 K/uL (ref 4.0–10.5)
nRBC: 0 % (ref 0.0–0.2)

## 2024-05-09 LAB — RENAL FUNCTION PANEL
Albumin: 4.2 g/dL (ref 3.5–5.0)
Anion gap: 13 (ref 5–15)
BUN: 21 mg/dL (ref 8–23)
CO2: 24 mmol/L (ref 22–32)
Calcium: 10.9 mg/dL — ABNORMAL HIGH (ref 8.9–10.3)
Chloride: 105 mmol/L (ref 98–111)
Creatinine, Ser: 1.11 mg/dL — ABNORMAL HIGH (ref 0.44–1.00)
GFR, Estimated: 52 mL/min — ABNORMAL LOW (ref >60)
Glucose, Bld: 176 mg/dL — ABNORMAL HIGH (ref 70–99)
Phosphorus: 2.6 mg/dL (ref 2.5–4.6)
Potassium: 3.6 mmol/L (ref 3.5–5.1)
Sodium: 142 mmol/L (ref 135–145)

## 2024-05-10 LAB — MICROALBUMIN / CREATININE URINE RATIO
Creatinine, Urine: 117.6 mg/dL
Microalb Creat Ratio: 32 mg/g{creat} — ABNORMAL HIGH (ref 0–29)
Microalb, Ur: 37.2 ug/mL — ABNORMAL HIGH

## 2024-06-29 ENCOUNTER — Ambulatory Visit: Admitting: Nurse Practitioner

## 2024-07-16 ENCOUNTER — Ambulatory Visit: Admitting: Cardiology

## 2024-09-05 ENCOUNTER — Other Ambulatory Visit

## 2024-09-05 ENCOUNTER — Other Ambulatory Visit (HOSPITAL_COMMUNITY)

## 2024-09-12 ENCOUNTER — Ambulatory Visit: Admitting: Physician Assistant
